# Patient Record
Sex: Female | Born: 1965 | Race: Black or African American | Hispanic: No | Smoking: Former smoker
Health system: Southern US, Community
[De-identification: ages and names within clinical notes are randomized; demographics above are authoritative.]

## PROBLEM LIST (undated history)

## (undated) DIAGNOSIS — E11319 Type 2 diabetes mellitus with unspecified diabetic retinopathy without macular edema: Secondary | ICD-10-CM

## (undated) DIAGNOSIS — K219 Gastro-esophageal reflux disease without esophagitis: Secondary | ICD-10-CM

## (undated) DIAGNOSIS — G8929 Other chronic pain: Secondary | ICD-10-CM

## (undated) DIAGNOSIS — M545 Low back pain, unspecified: Secondary | ICD-10-CM

## (undated) DIAGNOSIS — E669 Obesity, unspecified: Secondary | ICD-10-CM

## (undated) DIAGNOSIS — E785 Hyperlipidemia, unspecified: Secondary | ICD-10-CM

## (undated) DIAGNOSIS — E1169 Type 2 diabetes mellitus with other specified complication: Secondary | ICD-10-CM

## (undated) DIAGNOSIS — I10 Essential (primary) hypertension: Secondary | ICD-10-CM

## (undated) DIAGNOSIS — M549 Dorsalgia, unspecified: Secondary | ICD-10-CM

## (undated) DIAGNOSIS — I639 Cerebral infarction, unspecified: Secondary | ICD-10-CM

## (undated) DIAGNOSIS — R51 Headache: Secondary | ICD-10-CM

## (undated) DIAGNOSIS — Z8673 Personal history of transient ischemic attack (TIA), and cerebral infarction without residual deficits: Secondary | ICD-10-CM

## (undated) DIAGNOSIS — B009 Herpesviral infection, unspecified: Secondary | ICD-10-CM

## (undated) DIAGNOSIS — R519 Headache, unspecified: Secondary | ICD-10-CM

## (undated) HISTORY — DX: Hyperlipidemia, unspecified: E78.5

## (undated) HISTORY — DX: Low back pain: M54.5

## (undated) HISTORY — DX: Obesity, unspecified: E66.9

## (undated) HISTORY — DX: Low back pain, unspecified: M54.50

## (undated) HISTORY — DX: Essential (primary) hypertension: I10

## (undated) HISTORY — PX: LOOP RECORDER INSERTION: EP1214

## (undated) HISTORY — DX: Personal history of transient ischemic attack (TIA), and cerebral infarction without residual deficits: Z86.73

## (undated) HISTORY — DX: Gastro-esophageal reflux disease without esophagitis: K21.9

## (undated) HISTORY — DX: Type 2 diabetes mellitus with other specified complication: E11.69

## (undated) HISTORY — PX: BUBBLE STUDY: SHX6837

## (undated) HISTORY — PX: TEE WITHOUT CARDIOVERSION: SHX5443

## (undated) HISTORY — PX: BREAST LUMPECTOMY: SHX2

---

## 2000-02-11 HISTORY — PX: BREAST LUMPECTOMY: SHX2

## 2004-02-11 DIAGNOSIS — E1169 Type 2 diabetes mellitus with other specified complication: Secondary | ICD-10-CM | POA: Insufficient documentation

## 2010-03-13 DIAGNOSIS — Z8673 Personal history of transient ischemic attack (TIA), and cerebral infarction without residual deficits: Secondary | ICD-10-CM

## 2010-03-13 HISTORY — DX: Personal history of transient ischemic attack (TIA), and cerebral infarction without residual deficits: Z86.73

## 2011-03-27 ENCOUNTER — Ambulatory Visit (INDEPENDENT_AMBULATORY_CARE_PROVIDER_SITE_OTHER): Payer: Self-pay | Admitting: Internal Medicine

## 2011-03-27 ENCOUNTER — Encounter: Payer: Self-pay | Admitting: Internal Medicine

## 2011-03-27 VITALS — BP 197/103 | HR 65 | Temp 97.5°F | Ht 68.0 in | Wt 242.2 lb

## 2011-03-27 DIAGNOSIS — I10 Essential (primary) hypertension: Secondary | ICD-10-CM | POA: Insufficient documentation

## 2011-03-27 DIAGNOSIS — Z7689 Persons encountering health services in other specified circumstances: Secondary | ICD-10-CM | POA: Insufficient documentation

## 2011-03-27 DIAGNOSIS — Z8673 Personal history of transient ischemic attack (TIA), and cerebral infarction without residual deficits: Secondary | ICD-10-CM

## 2011-03-27 DIAGNOSIS — R519 Headache, unspecified: Secondary | ICD-10-CM | POA: Insufficient documentation

## 2011-03-27 DIAGNOSIS — E669 Obesity, unspecified: Secondary | ICD-10-CM

## 2011-03-27 DIAGNOSIS — E119 Type 2 diabetes mellitus without complications: Secondary | ICD-10-CM

## 2011-03-27 DIAGNOSIS — Z79899 Other long term (current) drug therapy: Secondary | ICD-10-CM

## 2011-03-27 DIAGNOSIS — R51 Headache: Secondary | ICD-10-CM

## 2011-03-27 LAB — LIPID PANEL
HDL: 38 mg/dL — ABNORMAL LOW (ref >39)
LDL Cholesterol: 105 mg/dL — ABNORMAL HIGH (ref 0–99)
Triglycerides: 147 mg/dL (ref <150)
VLDL: 29 mg/dL (ref 0–40)

## 2011-03-27 LAB — COMPREHENSIVE METABOLIC PANEL
ALT: 9 U/L (ref 0–35)
AST: 11 U/L (ref 0–37)
Alkaline Phosphatase: 68 U/L (ref 39–117)
BUN: 12 mg/dL (ref 6–23)
Chloride: 102 mEq/L (ref 96–112)
Creat: 0.97 mg/dL (ref 0.50–1.10)
Sodium: 137 mEq/L (ref 135–145)
Total Bilirubin: 0.2 mg/dL — ABNORMAL LOW (ref 0.3–1.2)

## 2011-03-27 LAB — POCT GLYCOSYLATED HEMOGLOBIN (HGB A1C): Hemoglobin A1C: 6.9

## 2011-03-27 LAB — GLUCOSE, CAPILLARY: Glucose-Capillary: 127 mg/dL — ABNORMAL HIGH (ref 70–99)

## 2011-03-27 MED ORDER — LISINOPRIL-HYDROCHLOROTHIAZIDE 10-12.5 MG PO TABS: 1.0000 | ORAL_TABLET | Freq: Every day | ORAL | Status: DC

## 2011-03-27 MED ORDER — ASPIRIN 81 MG PO TABS: 81.0000 mg | ORAL_TABLET | Freq: Every day | ORAL | Status: DC

## 2011-03-27 MED ORDER — ACETAMINOPHEN 500 MG PO TABS: 1000.0000 mg | ORAL_TABLET | Freq: Four times a day (QID) | ORAL | Status: DC | PRN

## 2011-03-27 NOTE — Assessment & Plan Note (Signed)
   Start aspirin daily  Plan risk factor assessment and management -   Check lipid panel today - initiate tx as indicated.  Restart blood pressure medications.

## 2011-03-27 NOTE — Patient Instructions (Addendum)
   Please follow-up at the clinic in 2 weeks with me, at which time we will reevaluate your blood pressure, diabetes.  There have been changes in your medications:  STOP carvedilol  START lisinopril-hydrochlorothiazide for your blood pressure - if you develop rash, tongue swelling, or lip swelling.   Start an aspirin 81 mg once daily.  Use tylenol extra strength as needed for pain.  Please follow-up in the clinic sooner if needed.  If you have been started on new medication(s), and you develop symptoms concerning for allergic reaction, including, but not limited to, throat closing, tongue swelling, rash, please stop the medication immediately and call the clinic at [phone], and go to the ER.  If you are diabetic, please bring your meter to your next visit.  If symptoms worsen, or new symptoms arise, please call the clinic or go to the ER.  Please bring all of your medications in a bag to your next visit.     Items required to complete an Eligibility Application   1. Picture ID (Can't be expired) 2. Current Bill to establish proof of residency 3. W-2 & Tax return (if self-employed include "Schedule C"), if not filing Form 4506 4. 4 current Pay stubs for this year 5. Printout of other income (Social security, unemployment, child support, workmen's comp) 6. Food stamp award letter, if receiving  7. Life Insurance (Need copy of the front page, showing name Ins Co. Name, and face amount). 8. Statement for pension, 401-K, IRS (needs to have current balance) 9. Tax Value for cars, houses, mobile homes, and land (Get from [facility] Tax Department) 10. Disability Paperwork (showing status of case) 11. College students: Print out of [place] received, tuition cost, books, etc. 12. If no Income: Engineer, maintenance of support for free shelter, money, food, Catering manager.  Bring all that you can to your follow up appointment to start the process.

## 2011-03-27 NOTE — Assessment & Plan Note (Signed)
Records requested from her prior PCP at [facility] - pending receipt of records.

## 2011-03-27 NOTE — Assessment & Plan Note (Addendum)
BP Readings from Last 3 Encounters:  03/27/11 197/103    Basic Metabolic Panel: No results found for this basename: na, k, cl, co2, bun, creatinine, glucose, calcium    Assessment: Status: Out of Coreg x 2 months. Financial limitation, so will need to keep on $4 meds for now at least. Previously on Acupril, but doesn't know why it was changed. States never had adverse reaction such as angioedema with ACE-I.  This seems to be where pt is at baseline when off of medications. There are NO new neurologic deficits on exam, no chest pain, etc.  Disease Control: not controlled  Progress toward goals: unable to assess  Barriers to meeting goals: financial need   Plan:  DC coreg  Start Lisinopril-HCTZ    CMET today, then will recheck BMET in 3-4 weeks to evaluate renal function.  Encouraged compliance.  Patient advised of red flag symptoms to indicate hypertensive emergency, for which she should present immediately for evaluation.

## 2011-03-27 NOTE — Progress Notes (Signed)
Subjective:   Patient ID: Michelle Brooks female   DOB: December 03, 1965 46 y.o.   MRN: [id number]  HPI: [name] is a 46 y.o. new patient to [facility] with a PMHx of DMII, Hypertension, prior stroke, who presented to clinic today for the following:  1) Establish care - patient was previously followed at [facility] by Dr. Meyer Cory [phone] (office), [phone] (hospital)). Has recently moved to [place] in 10/2010 to get away from stressors present in [place]. Has just attempted to establish care with a new provider. Has been out of her Coreg x 2 months because her Medicaid apparently doesn't cover prescriptions?  2) Headache -  Pt describes throbbing pain, bilateral in the frontal area. HA occuring for about 2 weeks, states it has been constant sine that time. Onset is insidious. Gets headaches every few weeks. Precipitating factors: perimenstrual patterns. Aggravating factors: bright light and loud noise, coughing, emotional stress, alleviating factors: aspirin, minimally helpful. denies recent trauma, MVA, falls. Family history: no known family members with significant headaches. denies associated congestion, dehydration, flashing lights, nausea, visual disturbance and vomiting.   3) DM, II (last A1c unknown) - Patient not checking blood sugars because does not have a meter - states her medicaid would not cover it. Currently taking Lantus 75 units qHS, occasionally taking 35 units instead of 75 units if she feels that her blood sugars are normal.denies polyuria, polydipsia, nausea, vomiting, diarrhea.  does request refills today.  4) HTN - Patient does not check blood pressure regularly at home. Currently taking Coreg 25mg  BID - has been out of it for about 2 months. admits to frequent headaches, dizziness, lightheadedness, Denies chest pain, shortness of breath, no new focal neurologic deficits.  does request refills today.    Review of Systems: Per HPI.  Current Outpatient  Medications: Medication Sig  . carvedilol (COREG) 25 MG tablet Take 25 mg by mouth 2 (two) times daily with a meal.  . Insulin Glargine (LANTUS SOLOSTAR [place]) Inject 75 Units into the skin at bedtime.    Allergies: No Known Allergies  Past Medical History  Diagnosis Date  . Diabetes mellitus type 2 in obese   . Hypertension   . Stroke 03/2010    Due to uncontrolled blood pressure    Past Surgical History  Procedure Date  . Breast lumpectomy 2002    states benign lymph node removed.     Objective:   Physical Exam: Filed Vitals:   03/27/11 1401  BP: 197/103  Pulse: 65  Temp: 97.5 F (36.4 C)     General: Vital signs reviewed and noted. Well-developed, well-nourished, in no acute distress; alert, appropriate and cooperative throughout examination.  Head: Normocephalic, atraumatic. No tenderness to palpation over maxillary or frontal sinuses.  Eyes: conjunctivae/corneas clear. PERRL, EOM's intact. Fundi benign.  Ears: TM cannot be visualized due to cerumen buildup, is still soft and without impaction.  Nose: Mucous membranes moist, not inflammed, nonerythematous.  Throat: Oropharynx nonerythematous, no exudate appreciated.   Neck: No deformities, masses, or tenderness noted.  Lungs:  Normal respiratory effort. Clear to auscultation BL without crackles or wheezes.  Heart: RRR. S1 and S2 normal without gallop, rubs. (+) murmur.  Abdomen:  BS normoactive. Soft, Nondistended, non-tender.  No masses or organomegaly.  Extremities: No pretibial edema.  Neurologic: A&O X3, CN II - XII are grossly intact. Motor strength is 5/5 in the all 4 extremities, Sensations intact to light touch, Cerebellar signs negative.      Assessment &  Plan:   Case and plan of care discussed with Dr. Blanch Media.

## 2011-03-27 NOTE — Assessment & Plan Note (Signed)
Concerning for tension headache versus secondary to her elevated blood pressures.   Tx with PRN extra strength tylenol  BP control.

## 2011-03-27 NOTE — Assessment & Plan Note (Addendum)
Labs: Lab Results  Component Value Date   HGBA1C 6.9 03/27/2011    Last eye exam and foot exam: No results found for this basename: HMDIABEYEEXA, HMDIABFOOTEX     Assessment:     Status: Patient has not a glucometer ever and does not know how to use a glucometer. Will need to see [facility] after she gets orange card   Disease Control: controlled  Progress toward goals: at goal  Barriers to meeting goals: financial need - still has 1 more flexpen. She wants to get in with [facility] first, before getting refill because she will not be able to afford to fill the Rx.   Plan: Glucometer log was not reviewed today, as pt did not have glucometer available for review.      continue current medications  Will get her a [employer] glucometer and try to set up with [employer].  Will do foot exam next visit.  Plan set up for eye exam after gets insurance/ or orange card settled.  Lipid panel, CMET, microalb/cr today.

## 2011-03-28 LAB — MICROALBUMIN / CREATININE URINE RATIO
Creatinine, Urine: 177.3 mg/dL
Microalb Creat Ratio: 11 mg/g (ref 0.0–30.0)
Microalb, Ur: 1.95 mg/dL — ABNORMAL HIGH (ref 0.00–1.89)

## 2011-04-01 ENCOUNTER — Other Ambulatory Visit: Payer: Self-pay | Admitting: Internal Medicine

## 2011-04-01 DIAGNOSIS — E785 Hyperlipidemia, unspecified: Secondary | ICD-10-CM | POA: Insufficient documentation

## 2011-04-01 DIAGNOSIS — E1169 Type 2 diabetes mellitus with other specified complication: Secondary | ICD-10-CM

## 2011-04-01 HISTORY — DX: Hyperlipidemia, unspecified: E78.5

## 2011-04-01 MED ORDER — PRAVASTATIN SODIUM 20 MG PO TABS: 20.0000 mg | ORAL_TABLET | Freq: Every evening | ORAL | Status: DC

## 2011-04-01 MED ORDER — INSULIN GLARGINE 100 UNIT/ML ~~LOC~~ SOLN: 75.0000 [IU] | Freq: Every day | SUBCUTANEOUS | Status: DC

## 2011-04-01 NOTE — Progress Notes (Signed)
Lipid panel shows suboptimal lipid control, with ldl of 105. Will start low dose pravastatin. Pt called and informed of this. She understands and all questions were answered. She is informed to be aware of signs of allergic reaction or side effects when starting any new medication. Will pick up rx tomorrow.  Will need lipid panel ans cmet in 6 weeks to reevaluate.  Johnette Abraham, D.O.

## 2011-04-02 ENCOUNTER — Encounter: Payer: Self-pay | Admitting: Internal Medicine

## 2011-04-02 MED ORDER — INSULIN GLARGINE 100 UNIT/ML ~~LOC~~ SOLN: 75.0000 [IU] | Freq: Every day | SUBCUTANEOUS | Status: DC

## 2011-04-02 MED ORDER — PRAVASTATIN SODIUM 20 MG PO TABS: 20.0000 mg | ORAL_TABLET | Freq: Every evening | ORAL | Status: DC

## 2011-04-02 NOTE — Progress Notes (Signed)
Addended by: Priscella Mann on: 04/02/2011 10:16 AM   Modules accepted: Orders

## 2011-04-21 ENCOUNTER — Encounter: Payer: Self-pay | Admitting: Internal Medicine

## 2011-04-21 ENCOUNTER — Telehealth: Payer: Self-pay | Admitting: *Deleted

## 2011-04-21 NOTE — Telephone Encounter (Signed)
Pt requests Lantus rx to be called to [facility]; states she has the orange card now and she lost the rx given to her 2/20. Rx called to the pharmacy.

## 2011-05-12 ENCOUNTER — Other Ambulatory Visit (INDEPENDENT_AMBULATORY_CARE_PROVIDER_SITE_OTHER): Payer: Self-pay

## 2011-05-12 ENCOUNTER — Other Ambulatory Visit: Payer: Self-pay | Admitting: Internal Medicine

## 2011-05-12 DIAGNOSIS — Z79899 Other long term (current) drug therapy: Secondary | ICD-10-CM

## 2011-05-12 DIAGNOSIS — Z7251 High risk heterosexual behavior: Secondary | ICD-10-CM

## 2011-05-12 LAB — COMPREHENSIVE METABOLIC PANEL
Albumin: 4.2 g/dL (ref 3.5–5.2)
Alkaline Phosphatase: 69 U/L (ref 39–117)
BUN: 14 mg/dL (ref 6–23)
Calcium: 9.4 mg/dL (ref 8.4–10.5)
Glucose, Bld: 168 mg/dL — ABNORMAL HIGH (ref 70–99)
Potassium: 4.2 mEq/L (ref 3.5–5.3)

## 2011-05-12 LAB — LIPID PANEL
Cholesterol: 151 mg/dL (ref 0–200)
Total CHOL/HDL Ratio: 4.4 Ratio
Triglycerides: 86 mg/dL (ref <150)

## 2011-05-12 NOTE — Progress Notes (Signed)
Addended by: Remus Blake on: 05/12/2011 11:49 AM   Modules accepted: Orders

## 2011-05-13 ENCOUNTER — Ambulatory Visit (INDEPENDENT_AMBULATORY_CARE_PROVIDER_SITE_OTHER): Payer: Self-pay | Admitting: Internal Medicine

## 2011-05-13 ENCOUNTER — Encounter: Payer: Self-pay | Admitting: Internal Medicine

## 2011-05-13 VITALS — BP 122/78 | HR 75 | Temp 97.2°F | Ht 68.0 in | Wt 240.0 lb

## 2011-05-13 DIAGNOSIS — E1165 Type 2 diabetes mellitus with hyperglycemia: Secondary | ICD-10-CM

## 2011-05-13 DIAGNOSIS — Z0000 Encounter for general adult medical examination without abnormal findings: Secondary | ICD-10-CM | POA: Insufficient documentation

## 2011-05-13 DIAGNOSIS — I10 Essential (primary) hypertension: Secondary | ICD-10-CM

## 2011-05-13 DIAGNOSIS — E119 Type 2 diabetes mellitus without complications: Secondary | ICD-10-CM

## 2011-05-13 DIAGNOSIS — Z1231 Encounter for screening mammogram for malignant neoplasm of breast: Secondary | ICD-10-CM

## 2011-05-13 DIAGNOSIS — E1169 Type 2 diabetes mellitus with other specified complication: Secondary | ICD-10-CM

## 2011-05-13 DIAGNOSIS — N63 Unspecified lump in unspecified breast: Secondary | ICD-10-CM

## 2011-05-13 DIAGNOSIS — E785 Hyperlipidemia, unspecified: Secondary | ICD-10-CM

## 2011-05-13 MED ORDER — PRAVASTATIN SODIUM 20 MG PO TABS: 20.0000 mg | ORAL_TABLET | Freq: Every evening | ORAL | Status: DC

## 2011-05-13 MED ORDER — INSULIN GLARGINE 100 UNIT/ML ~~LOC~~ SOLN: 75.0000 [IU] | Freq: Every day | SUBCUTANEOUS | Status: DC

## 2011-05-13 MED ORDER — LISINOPRIL-HYDROCHLOROTHIAZIDE 10-12.5 MG PO TABS: 1.0000 | ORAL_TABLET | Freq: Every day | ORAL | Status: DC

## 2011-05-13 NOTE — Assessment & Plan Note (Signed)
Pertinent Labs: Liver Function Tests:    Component Value Date/Time   AST 11 05/12/2011 1110   ALT 11 05/12/2011 1110   ALKPHOS 69 05/12/2011 1110   BILITOT 0.4 05/12/2011 1110   PROT 7.1 05/12/2011 1110   ALBUMIN 4.2 05/12/2011 1110    Lipid Panel:     Component Value Date/Time   CHOL 151 05/12/2011 1110   TRIG 86 05/12/2011 1110   HDL 34* 05/12/2011 1110   CHOLHDL 4.4 05/12/2011 1110   VLDL 17 05/12/2011 1110   LDLCALC 100* 05/12/2011 1110   Assessment: Status: - Restarted on therapy in 03/2011. - Has tolerated the pravastatin well. - Repeat FLP after starting pravastatin shows LDL at goal.  - Patient is compliant all of the time with prescribed medications.   Disease Control: controlled  Progress toward goals: at goal  Barriers to meeting goals: no barriers identified   Plan:  continue current medications  Continue efforts towards diet control, exercise, weight loss.  Congratulated on all of her great efforts.

## 2011-05-13 NOTE — Assessment & Plan Note (Signed)
Pertinent Data: BP Readings from Last 3 Encounters:  05/13/11 122/78  03/27/11 197/103    Basic Metabolic Panel:    Component Value Date/Time   NA 138 05/12/2011 1110   K 4.2 05/12/2011 1110   CL 103 05/12/2011 1110   CO2 23 05/12/2011 1110   BUN 14 05/12/2011 1110   CREATININE 1.11* 05/12/2011 1110   GLUCOSE 168* 05/12/2011 1110   CALCIUM 9.4 05/12/2011 1110    Assessment: Status: - Patient started on Lisinopril-HCTZ 10-12.5mg  during last visit, and has had a remarkable improvement in her blood pressure with this regimen.   - Repeat BMET after starting the medication shows a slight increased in SCr, but minimal - which can be expected. - Patient is compliant all of the time with prescribed medications.   Disease Control: controlled  Progress toward goals: improved  Barriers to meeting goals: no barriers identified   Plan:  continue current medications  Congratulated on all of her efforts and great work.

## 2011-05-13 NOTE — Assessment & Plan Note (Signed)
Pertinent Labs: Lab Results  Component Value Date   HGBA1C 6.9 03/27/2011   CREATININE 1.11* 05/12/2011   MICROALBUR 1.95* 03/27/2011   MICRALBCREAT 11.0 03/27/2011   CHOL 151 05/12/2011   HDL 34* 05/12/2011   TRIG 86 05/12/2011    Assessment: Status: - Patient intermittently missing her Lantus if she feels her blood sugars are normal - this is what she was previously recommended.  - Patient is noncompliant some of the time with prescribed medications.   Disease Control: controlled  Progress toward goals: at goal  Barriers to meeting goals: no barriers identified   Plan: Glucometer log was not reviewed today, as pt did not have glucometer available for review.     continue current medications  Reminded to get eye exam - referral placed - pt added to [facility] eye list  Reminded to bring blood glucose meter & log to each visit - I need to investigate what glucometer and log are covered by [facility] card/ MAP once she gets it - bc we are unable to download her old meter.  Discussed foot care and discussed the need for weight loss  Encouraged to take her medications regularly, do not miss doses, let us assess control on current therapy - and we can scale back if needed on her Lantus.  Encouraged to follow general rules for hypoglycemia if it should arise.

## 2011-05-13 NOTE — Patient Instructions (Signed)
   Please follow-up at the clinic in 3 months, at which time we will reevaluate your diabetes, blood pressure. We will try to do a PAP at this visit if possible. - OR, please follow-up in the clinic sooner if needed.  There have not been changes in your medications - keep log of your blood sugars, and check them at least once a day.  Take your Lantus every day.  Please get your mammogram completed  Please go for your eye exam - please do not miss this appointment    If you have been started on new medication(s), and you develop symptoms concerning for allergic reaction, including, but not limited to, throat closing, tongue swelling, rash, please stop the medication immediately and call the clinic at [phone], and go to the ER.  If you are diabetic, please bring your meter to your next visit.  If symptoms worsen, or new symptoms arise, please call the clinic or go to the ER.  Please bring all of your medications in a bag to your next visit.

## 2011-05-13 NOTE — Assessment & Plan Note (Signed)
Health Maintenance  Topic Date Due  . Pap Smear  1965/05[birth year]12  . Foot Exam  08/13/1975  . Ophthalmology Exam  08/13/1975  . Mammogram  08/13/1983  . Tetanus/tdap  08/12/1984  . Hemoglobin A1c  06/24/2011  . Influenza Vaccine  11/11/2011  . Urine Microalbumin  03/26/2012  . Lipid Panel  05/11/2012  . Pneumococcal Polysaccharide Vaccine (#2) 03/26/2015    Assessment:  Due for mammogram, eye exam, foot exam, Tdap, PAP.   Plan:  Mammogram referral made today.  Scheduled patient for [facility] eye exam.  Foot exam today.  Plan PAP next visit.  Will discuss Tdap next visit.

## 2011-05-13 NOTE — Progress Notes (Signed)
Subjective:    Patient ID: Michelle Brooks female   DOB: August 04, 1965 46 y.o.   MRN: [id number]  HPI: Michelle Brooks is a 46 y.o. F with a PMHx of DMII, Hypertension, prior stroke, who presented to clinic today for the following:  1) DM, II - Last A1c 6.9 03/2011 - Reports fasting blood sugars of 106-130s mg/dL. Currently taking Lantus 75 units. Patient misses doses 2 x per week on average if she feels that her blood sugars are normal.  0 hypoglycemic episodes since last visit. denies polyuria, polydipsia, nausea, vomiting, diarrhea. Does request refills today.   2) HTN - Patient does not check blood pressure regularly at home. Currently taking Lisinopril-HCTZ 10-12.5mg  daily, which was started last visit. Patient misses doses 0 x per week on average. denies headaches, dizziness, lightheadedness, chest pain, shortness of breath.  does request refills today.  3) HLD - currently taking Pravastatin 20mg  once daily. Patient misses doses 0 x per week on average. denies chest pain, difficulty breathing, palpitations, tachycardia, and muscle pains. does request refills today.    4) Preventative Health Care - due for mammogram, eye exam, foot exam, Tdap, PAP.   Review of Systems: Per HPI.  Current Outpatient Medications: Medication Sig  . aspirin 81 MG tablet Take 1 tablet (81 mg total) by mouth daily.  . insulin glargine (LANTUS SOLOSTAR) 100 UNIT/ML injection Inject 75 Units into the skin at bedtime. Dispense 2 boxes - needs 8 pens per month.  . lisinopril-hydrochlorothiazide (PRINZIDE) 10-12.5 MG per tablet Take 1 tablet by mouth daily.  . pravastatin (PRAVACHOL) 20 MG tablet Take 1 tablet (20 mg total) by mouth every evening.  [name] acetaminophen (TYLENOL) 500 MG tablet Take 2 tablets (1,000 mg total) by mouth every 6 (six) hours as needed for pain.   Allergies: No Known Allergies  Past Medical History  Diagnosis Date  . Diabetes mellitus type 2 in obese   . Hypertension   . History of stroke  03/2010    Right MCA stroke in 03/2010, with MRI also noting subacute strokes in left fronto parietal area - occured in [place] received care at [facility] thought due to uncontrolled blood pressure // No residual deficits // TTE (03/2010) at [facility] - normal LV systolic function, moderate pericardial effusion with 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  [name] Hyperlipidemia LDL goal < 100 04/01/2011    Past Surgical History  Procedure Date  . Breast lumpectomy 2002    states benign lymph node removed.    Objective:    Physical Exam: Filed Vitals:   05/13/11 1130  BP: 149/87  Pulse: 75  Temp: 97.2 F (36.2 C)    Recheck BP: 122/78  General: Vital signs reviewed and noted. Well-developed, well-nourished, in no acute distress; alert, appropriate and cooperative throughout examination.  Head: Normocephalic, atraumatic. No tenderness to palpation over maxillary or frontal sinuses.  Eyes: conjunctivae/corneas clear. PERRL.  Ears: TM cannot be visualized due to cerumen buildup, is still soft and without impaction.  Nose: Mucous membranes moist, not inflammed, nonerythematous.  Throat: Oropharynx nonerythematous, no exudate appreciated.   Neck: No deformities, masses, or tenderness noted.  Lungs:  Normal respiratory effort. Clear to auscultation BL without crackles or wheezes.  Heart: RRR. S1 and S2 normal without gallop, rubs. (+) murmur.  Abdomen:  BS normoactive. Soft, Nondistended, non-tender.  No masses or organomegaly.  Extremities: No pretibial edema.    Assessment/ Plan:   Case  and plan of care discussed with Dr. Doneen Poisson.

## 2011-05-14 ENCOUNTER — Other Ambulatory Visit: Payer: Self-pay

## 2011-06-04 ENCOUNTER — Telehealth: Payer: Self-pay | Admitting: *Deleted

## 2011-06-04 NOTE — Telephone Encounter (Signed)
Pt called stating she is in [place], [place] and was not able to keep her Mammogram appointment.  She called and changed it to May 21. This is FYI.

## 2011-06-06 ENCOUNTER — Ambulatory Visit (HOSPITAL_COMMUNITY): Payer: Self-pay

## 2011-06-12 ENCOUNTER — Other Ambulatory Visit: Payer: Self-pay | Admitting: Internal Medicine

## 2011-06-12 DIAGNOSIS — Q141 Congenital malformation of retina: Secondary | ICD-10-CM

## 2011-07-01 ENCOUNTER — Telehealth: Payer: Self-pay | Admitting: *Deleted

## 2011-07-01 ENCOUNTER — Ambulatory Visit (HOSPITAL_COMMUNITY)
Admission: RE | Admit: 2011-07-01 | Discharge: 2011-07-01 | Disposition: A | Discharge status: Home / Self Care | Payer: Medicaid Other | Source: Ambulatory Visit | Attending: Internal Medicine | Admitting: Internal Medicine

## 2011-07-01 DIAGNOSIS — Z1231 Encounter for screening mammogram for malignant neoplasm of breast: Secondary | ICD-10-CM | POA: Insufficient documentation

## 2011-07-01 NOTE — Telephone Encounter (Signed)
CALLED PATIENT LEFT VOICE MESSAGE FOR PATIENT TO CALL BACK, NEED TO KNOW IF SHE HAS HER MEDICAID CARD / WHO'S THE PCP ON THE CARD, AND WE NEED COPY OF CARD IN THE SYSTEM. NEED TO MAKE REFERRAL WITH DR Luciana Axe.  Genesys Coggeshall NT [phone]  11:04PM

## 2011-07-03 ENCOUNTER — Observation Stay (HOSPITAL_COMMUNITY)
Admission: EM | Admit: 2011-07-03 | Discharge: 2011-07-03 | Disposition: A | Discharge status: Home / Self Care | Payer: Self-pay | Attending: Emergency Medicine | Admitting: Emergency Medicine

## 2011-07-03 ENCOUNTER — Encounter (HOSPITAL_COMMUNITY): Payer: Self-pay | Admitting: *Deleted

## 2011-07-03 ENCOUNTER — Telehealth: Payer: Self-pay | Admitting: *Deleted

## 2011-07-03 DIAGNOSIS — R739 Hyperglycemia, unspecified: Secondary | ICD-10-CM

## 2011-07-03 DIAGNOSIS — E785 Hyperlipidemia, unspecified: Secondary | ICD-10-CM | POA: Insufficient documentation

## 2011-07-03 DIAGNOSIS — I10 Essential (primary) hypertension: Secondary | ICD-10-CM | POA: Insufficient documentation

## 2011-07-03 DIAGNOSIS — E119 Type 2 diabetes mellitus without complications: Principal | ICD-10-CM | POA: Insufficient documentation

## 2011-07-03 DIAGNOSIS — Z8673 Personal history of transient ischemic attack (TIA), and cerebral infarction without residual deficits: Secondary | ICD-10-CM | POA: Insufficient documentation

## 2011-07-03 HISTORY — DX: Cerebral infarction, unspecified: I63.9

## 2011-07-03 LAB — POCT I-STAT, CHEM 8
BUN: 24 mg/dL — ABNORMAL HIGH (ref 6–23)
Calcium, Ion: 1.16 mmol/L (ref 1.12–1.32)
Chloride: 98 mEq/L (ref 96–112)
Glucose, Bld: 591 mg/dL (ref 70–99)
HCT: 43 % (ref 36.0–46.0)
TCO2: 25 mmol/L (ref 0–100)

## 2011-07-03 LAB — URINALYSIS, ROUTINE W REFLEX MICROSCOPIC
Glucose, UA: >1000 mg/dL — AB
Ketones, ur: 15 mg/dL — AB
Leukocytes, UA: NEGATIVE
Protein, ur: NEGATIVE mg/dL
Urobilinogen, UA: 0.2 mg/dL (ref 0.0–1.0)

## 2011-07-03 LAB — GLUCOSE, CAPILLARY
Glucose-Capillary: 451 mg/dL — ABNORMAL HIGH (ref 70–99)
Glucose-Capillary: 418 mg/dL — ABNORMAL HIGH (ref 70–99)
Glucose-Capillary: 261 mg/dL — ABNORMAL HIGH (ref 70–99)

## 2011-07-03 LAB — URINE MICROSCOPIC-ADD ON

## 2011-07-03 LAB — BASIC METABOLIC PANEL
Chloride: 103 mEq/L (ref 96–112)
GFR calc Af Amer: 86 mL/min — ABNORMAL LOW (ref >90)
GFR calc non Af Amer: 74 mL/min — ABNORMAL LOW (ref >90)
Potassium: 3.3 mEq/L — ABNORMAL LOW (ref 3.5–5.1)
Sodium: 137 mEq/L (ref 135–145)

## 2011-07-03 LAB — POCT I-STAT 3, ART BLOOD GAS (G3+)
Acid-base deficit: 2 mmol/L (ref 0.0–2.0)
Bicarbonate: 23.1 mEq/L (ref 20.0–24.0)
O2 Saturation: 94 %
Patient temperature: 98.6
TCO2: 24 mmol/L (ref 0–100)

## 2011-07-03 LAB — CBC
MCH: 29.3 pg (ref 26.0–34.0)
MCHC: 35.7 g/dL (ref 30.0–36.0)
MCV: 82.2 fL (ref 78.0–100.0)
Platelets: 256 10*3/uL (ref 150–400)
RBC: 4.6 MIL/uL (ref 3.87–5.11)
RDW: 12.7 % (ref 11.5–15.5)

## 2011-07-03 LAB — DIFFERENTIAL
Basophils Relative: 0 % (ref 0–1)
Eosinophils Absolute: 0.2 10*3/uL (ref 0.0–0.7)
Eosinophils Relative: 2 % (ref 0–5)
Lymphs Abs: 3 10*3/uL (ref 0.7–4.0)
Neutrophils Relative %: 56 % (ref 43–77)

## 2011-07-03 MED ORDER — ONDANSETRON HCL 4 MG/2ML IJ SOLN: 4.0000 mg | Freq: Three times a day (TID) | INTRAMUSCULAR | Status: DC | PRN

## 2011-07-03 MED ORDER — SODIUM CHLORIDE 0.9 % IV SOLN
INTRAVENOUS | Status: DC
  Administered 2011-07-03: 07:00:00 via INTRAVENOUS

## 2011-07-03 MED ORDER — ACETAMINOPHEN 325 MG PO TABS: 650.0000 mg | ORAL_TABLET | ORAL | Status: DC | PRN

## 2011-07-03 MED ORDER — SODIUM CHLORIDE 0.9 % IV SOLN
INTRAVENOUS | Status: DC
  Administered 2011-07-03: 09:00:00 via INTRAVENOUS
  Administered 2011-07-03: 3.9 [IU]/h via INTRAVENOUS
  Filled 2011-07-03: qty 1

## 2011-07-03 MED ORDER — SODIUM CHLORIDE 0.9 % IV BOLUS (SEPSIS)
1000.0000 mL | Freq: Once | INTRAVENOUS | Status: AC
  Administered 2011-07-03: 1000 mL via INTRAVENOUS

## 2011-07-03 NOTE — Discharge Instructions (Signed)
Read the information below.  Please monitor your blood sugar closely and take your medication as directed.  Follow a diabetic diet.  Call your primary care provider today to schedule a close follow up appointment.  You may return to the ER at any time for worsening condition or any new symptoms that concern you.   Blood Sugar Monitoring, Adult GLUCOSE METERS FOR SELF-MONITORING OF BLOOD GLUCOSE  It is important to be able to correctly measure your blood sugar (glucose). You can use a blood glucose monitor (a small battery-operated device) to check your glucose level at any time. This allows you and your caregiver to monitor your diabetes and to determine how well your treatment plan is working. The process of monitoring your blood glucose with a glucose meter is called self-monitoring of blood glucose (SMBG). When people with diabetes control their blood sugar, they have better health. To test for glucose with a typical glucose meter, place the disposable strip in the meter. Then place a small sample of blood on the "test strip." The test strip is coated with chemicals that combine with glucose in blood. The meter measures how much glucose is present. The meter displays the glucose level as a number. Several new models can record and store a number of test results. Some models can connect to personal computers to store test results or print them out.  Newer meters are often easier to use than older models. Some meters allow you to get blood from places other than your fingertip. Some new models have automatic timing, error codes, signals, or barcode readers to help with proper adjustment (calibration). Some meters have a large display screen or spoken instructions for people with visual impairments.  INSTRUCTIONS FOR USING GLUCOSE METERS  Wash your hands with soap and warm water, or clean the area with alcohol. Dry your hands completely.   Prick the side of your fingertip with a lancet (a sharp-pointed  tool used by hand).   Hold the hand down and gently milk the finger until a small drop of blood appears. Catch the blood with the test strip.   Follow the instructions for inserting the test strip and using the SMBG meter. Most meters require the meter to be turned on and the test strip to be inserted before applying the blood sample.   Record the test result.   Read the instructions carefully for both the meter and the test strips that go with it. Meter instructions are found in the user manual. Keep this manual to help you solve any problems that may arise. Many meters use "error codes" when there is a problem with the meter, the test strip, or the blood sample on the strip. You will need the manual to understand these error codes and fix the problem.   New devices are available such as laser lancets and meters that can test blood taken from "alternative sites" of the body, other than fingertips. However, you should use standard fingertip testing if your glucose changes rapidly. Also, use standard testing if:   You have eaten, exercised, or taken insulin in the past 2 hours.   You think your glucose is low.   You tend to not feel symptoms of low blood glucose (hypoglycemia).   You are ill or under stress.   Clean the meter as directed by the manufacturer.   Test the meter for accuracy as directed by the manufacturer.   Take your meter with you to your caregiver's office. This way, you  can test your glucose in front of your caregiver to make sure you are using the meter correctly. Your caregiver can also take a sample of blood to test using a routine lab method. If values on the glucose meter are close to the lab results, you and your caregiver will see that your meter is working well and you are using good technique. Your caregiver will advise you about what to do if the results do not match.  FREQUENCY OF TESTING  Your caregiver will tell you how often you should check your blood  glucose. This will depend on your type of diabetes, your current level of diabetes control, and your types of medicines. The following are general guidelines, but your care plan may be different. Record all your readings and the time of day you took them for review with your caregiver.   Diabetes type 1.   When you are using insulin with good diabetic control (either multiple daily injections or via a pump), you should check your glucose 4 times a day.   If your diabetes is not well controlled, you may need to monitor more frequently, including before meals and 2 hours after meals, at bedtime, and occasionally between 2 a.m. and 3 a.m.   You should always check your glucose before a dose of insulin or before changing the rate on your insulin pump.   Diabetes type 2.   Guidelines for SMBG in diabetes type 2 are not as well defined.   If you are on insulin, follow the guidelines above.   If you are on medicines, but not insulin, and your glucose is not well controlled, you should test at least twice daily.   If you are not on insulin, and your diabetes is controlled with medicines or diet alone, you should test at least once daily, usually before breakfast.   A weekly profile will help your caregiver advise you on your care plan. The week before your visit, check your glucose before a meal and 2 hours after a meal at least daily. You may want to test before and after a different meal each day so you and your caregiver can tell how well controlled your blood sugars are throughout the course of a 24 hour period.   Gestational diabetes (diabetes during pregnancy).   Frequent testing is often necessary. Accurate timing is important.   If you are not on insulin, check your glucose 4 times a day. Check it before breakfast and 1 hour after the start of each meal.   If you are on insulin, check your glucose 6 times a day. Check it before each meal and 1 hour after the first bite of each meal.    General guidelines.   More frequent testing is required at the start of insulin treatment. Your caregiver will instruct you.   Test your glucose any time you suspect you have low blood sugar (hypoglycemia).   You should test more often when you change medicines, when you have unusual stress or illness, or in other unusual circumstances.  OTHER THINGS TO KNOW ABOUT GLUCOSE METERS  Measurement Range. Most glucose meters are able to read glucose levels over a broad range of values from as low as 0 to as high as 600 mg/dL. If you get an extremely high or low reading from your meter, you should first confirm it with another reading. Report very high or very low readings to your caregiver.   Whole Blood Glucose versus Plasma Glucose. Some older  home glucose meters measure glucose in your whole blood. In a lab or when using some newer home glucose meters, the glucose is measured in your plasma (one component of blood). The difference can be important. It is important for you and your caregiver to know whether your meter gives its results as "whole blood equivalent" or "plasma equivalent."   Display of High and Low Glucose Values. Part of learning how to operate a meter is understanding what the meter results mean. Know how high and low glucose concentrations are displayed on your meter.   Factors that Affect Glucose Meter Performance. The accuracy of your test results depends on many factors and varies depending on the brand and type of meter. These factors include:   Low red blood cell count (anemia).   Substances in your blood (such as uric acid, vitamin C, and others).   Environmental factors (temperature, humidity, altitude).   Name-brand versus generic test strips.   Calibration. Make sure your meter is set up properly. It is a good idea to do a calibration test with a control solution recommended by the manufacturer of your meter whenever you begin using a fresh bottle of test strips.  This will help verify the accuracy of your meter.   Improperly stored, expired, or defective test strips. Keep your strips in a dry place with the lid on.   Soiled meter.   Inadequate blood sample.  NEW TECHNOLOGIES FOR GLUCOSE TESTING Alternative site testing Some glucose meters allow testing blood from alternative sites. These include the:  Upper arm.   Forearm.   Base of the thumb.   Thigh.  Sampling blood from alternative sites may be desirable. However, it may have some limitations. Blood in the fingertips show changes in glucose levels more quickly than blood in other parts of the body. This means that alternative site test results may be different from fingertip test results, not because of the meter's ability to test accurately, but because the actual glucose concentration can be different.  Continuous Glucose Monitoring Devices to measure your blood glucose continuously are available, and others are in development. These methods can be more expensive than self-monitoring with a glucose meter. However, it is uncertain how effective and reliable these devices are. Your caregiver will advise you if this approach makes sense for you. IF BLOOD SUGARS ARE CONTROLLED, PEOPLE WITH DIABETES REMAIN HEALTHIER.  SMBG is an important part of the treatment plan of patients with diabetes mellitus. Below are reasons for using SMBG:   It confirms that your glucose is at a specific, healthy level.   It detects hypoglycemia and severe hyperglycemia.   It allows you and your caregiver to make adjustments in response to changes in lifestyle for individuals requiring medicine.   It determines the need for starting insulin therapy in temporary diabetes that happens during pregnancy (gestational diabetes).  Document Released: 01/30/2003 Document Revised: 01/16/2011 Document Reviewed: 05/23/2010 [facility] Patient Information 2012 [place], [place].  Type 2 Diabetes Diabetes is a long-lasting  (chronic) disease. One or both of the following happen with type 2 diabetes:   The pancreas does not make enough of a hormone called insulin.   The body has trouble using the insulin that is made.  HOME CARE  Check your blood sugar (glucose) once a day, or as told by your doctor.   Take all medicine as told by your doctor.   Do not smoke.   Eat healthy foods. Weight loss can help your diabetes.   Learn  about low blood sugar (hypoglycemia). Know how to treat it.   Get your eyes checked on a regular basis.   Get a physical exam every year. Get your blood pressure checked. Get your blood and pee (urine) tested.   Wear a necklace or bracelet that says you have diabetes.   Check your feet every night for cuts, sores, blisters, and redness. Tell your doctor if you have problems.  GET HELP RIGHT AWAY IF:  You have trouble keeping your blood sugar in target range.   You have problems with your medicines.   You are sick and not getting better after 24 hours.   You have a sore or wound that is not healing.   You have vision problems or changes.   You have a fever.  MAKE SURE YOU:  Understand these instructions.   Will watch your condition.   Will get help right away if you are not doing well or get worse.  Document Released: 11/06/2007 Document Revised: 01/16/2011 Document Reviewed: 07/15/2010 [facility] Patient Information 2012 [place], [place].Type 2 Diabetes Diabetes is a long-lasting (chronic) disease. One or both of the following happen with type 2 diabetes:   The pancreas does not make enough of a hormone called insulin.   The body has trouble using the insulin that is made.  HOME CARE  Check your blood sugar (glucose) once a day, or as told by your doctor.   Take all medicine as told by your doctor.   Do not smoke.   Eat healthy foods. Weight loss can help your diabetes.   Learn about low blood sugar (hypoglycemia). Know how to treat it.   Get your eyes  checked on a regular basis.   Get a physical exam every year. Get your blood pressure checked. Get your blood and pee (urine) tested.   Wear a necklace or bracelet that says you have diabetes.   Check your feet every night for cuts, sores, blisters, and redness. Tell your doctor if you have problems.  GET HELP RIGHT AWAY IF:  You have trouble keeping your blood sugar in target range.   You have problems with your medicines.   You are sick and not getting better after 24 hours.   You have a sore or wound that is not healing.   You have vision problems or changes.   You have a fever.  MAKE SURE YOU:  Understand these instructions.   Will watch your condition.   Will get help right away if you are not doing well or get worse.  Document Released: 11/06/2007 Document Revised: 01/16/2011 Document Reviewed: 07/15/2010 [facility] Patient Information 2012 [place], [place].

## 2011-07-03 NOTE — ED Notes (Signed)
Patient presents to ed c/o not feeling well states she has felt sluggish for the past several days. States she is suppose to be taking 75 units of Lantus insulin every night at bedtime. States she hasn't taken in 2-3 weeks. She checked her sugar at home this am and it registered high on 2 different machines so she decided to come in however prior to coming did have one dose of insulin left and states she took it. Alert oriented daugher with patient

## 2011-07-03 NOTE — ED Provider Notes (Signed)
Medical screening examination/treatment/procedure(s) were conducted as a shared visit with non-physician practitioner(s) and myself.  I personally evaluated the patient during the encounter   Laray Anger, DO 07/03/11 1639

## 2011-07-03 NOTE — ED Notes (Signed)
Report called to [facility] CDU

## 2011-07-03 NOTE — ED Notes (Signed)
Insulin drip stopped. Pt sleeping. resp even and nonlabored

## 2011-07-03 NOTE — ED Provider Notes (Signed)
7:16 AM Patient is CDU under observation, hyperglycemia protocol. Sign out received from Dr Clarene Duke.  Pt has been off of her Lantus for two weeks.  She is a patient of [facility], apparently has Lantus waiting for her at the pharmacy for pick up. Blood glucose is 508.  Anion gap is 9.  Pt currently starting glucose stabilizer, K to be rechecked around 9am (orders placed by Dr Clarene Duke).  Plan is for better control of blood glucose and d/c home.    7:52 AM Patient reports she is feeling much better than when she came in.  Agrees that she does have medication waiting for her at the pharmacy.    10:52 AM Pt continues to feel well.  CBG now 199.  Pt has Lantus at the pharmacy that she will pick up today.  Plan is for d/c home with PCP follow up.  Patient verbalizes understanding and agrees with plan.    Michelle Brooks, [place] 07/03/11 1052

## 2011-07-03 NOTE — ED Provider Notes (Signed)
History     CSN: [id number]  Arrival date & time 07/03/11  0310   First MD Initiated Contact with Patient 07/03/11 0340      Chief Complaint  Patient presents with  . Hyperglycemia     HPI Pt was seen at 0350.  Per pt, c/o gradual onset and persistence of constant "high" blood sugars at home since yesterday.  Pt states she has not taken her SQ lantus in approx 2 weeks.  States she does have a new lantus rx "waiting for me at the pharmacy" to be picked up today.  States she had one dose of lantus 75 units left at home and took it approx 0100 this morning PTA.  Pt's only complaint is "I'm just tired."  Denies CP/palpitations, no SOB/cough, no abd pain, no N/V/D, no fevers.      PMD:  [facility] Past Medical History  Diagnosis Date  . Diabetes mellitus type 2 in obese   . Hypertension   . 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  [name] Hyperlipidemia LDL goal < 100 04/01/2011  . Stroke     Past Surgical History  Procedure Date  . Breast lumpectomy 2002    states benign lymph node removed.    Family History  Problem Relation Age of Onset  . Diabetes Mother   . Stroke Mother 80  . Diabetes Brother   . Diabetes Brother   . Cancer Brother 48    unknown type  . Hypertension Brother     History  Substance Use Topics  . Smoking status: Former Smoker -- 0.2 packs/day for 7 years    Types: Cigarettes    Quit date: 03/26/2004  . Smokeless tobacco: Never Used  . Alcohol Use: No    Review of Systems ROS: Statement: All systems negative except as marked or noted in the HPI; Constitutional: +generalized  fatigue. Negative for fever and chills. ; ; Eyes: Negative for eye pain, redness and discharge. ; ; ENMT: Negative for ear pain, hoarseness, nasal congestion, sinus pressure and sore throat. ; ; Cardiovascular: Negative for chest pain, palpitations, diaphoresis, dyspnea and peripheral edema. ; ; Respiratory: Negative for cough, wheezing and stridor. ; ; Gastrointestinal: Negative for nausea, vomiting, diarrhea, abdominal pain, blood in stool, hematemesis, jaundice and rectal bleeding. . ; ; Genitourinary: Negative for dysuria, flank pain and hematuria. ; ; Musculoskeletal: Negative for back pain and neck pain. Negative for swelling and trauma.; ; Skin: Negative for pruritus, rash, abrasions, blisters, bruising and skin lesion.; ; Neuro: Negative for headache, lightheadedness and neck stiffness. Negative for weakness, altered level of consciousness , altered mental status, extremity weakness, paresthesias, involuntary movement, seizure and syncope.      Allergies  Review of patient's allergies indicates no known allergies.  Home Medications   Current Outpatient Rx  Name Route Sig Dispense Refill  . ASPIRIN 81 MG PO TABS Oral Take 1 tablet (81 mg total) by mouth daily. 30 tablet   . INSULIN GLARGINE 100 UNIT/ML Kimberly SOLN Subcutaneous Inject 75 Units into the skin at bedtime. Dispense 2 boxes - needs 8 pens per month. 8 pen 12  .  LISINOPRIL-HYDROCHLOROTHIAZIDE 10-12.5 MG PO TABS Oral Take 1 tablet by mouth daily. 30 tablet 5  . PRAVASTATIN SODIUM 20 MG PO TABS Oral Take 1 tablet (20 mg total) by mouth every evening. 30 tablet 5  . TETRAHYDROZOLINE-ZN SULFATE 0.05-0.25 % OP SOLN Both Eyes Place 2 drops into both eyes 3 (three) times daily as needed. Dry eyes      BP 139/86  Pulse 85  Temp(Src) 98.1 F (36.7 C) (Oral)  Resp 16  SpO2 96%  LMP 06/15/2011  Physical Exam 0355: Physical examination:  Nursing notes reviewed; Vital signs and O2 SAT reviewed;  Constitutional: Well developed, Well  nourished, Well hydrated, In no acute distress; Head:  Normocephalic, atraumatic; Eyes: EOMI, PERRL, No scleral icterus; ENMT: Mouth and pharynx normal, Mucous membranes moist; Neck: Supple, Full range of motion, No lymphadenopathy; Cardiovascular: Regular rate and rhythm, No murmur or gallop; Respiratory: Breath sounds clear & equal bilaterally, No wheezes, speaking full sentences with ease, Normal respiratory effort/excursion; Chest: Nontender, Movement normal; Abdomen: Soft, Nontender, Nondistended, Normal bowel sounds; Genitourinary: No CVA tenderness; Extremities: Pulses normal, No tenderness, No edema, No calf edema or asymmetry.; Neuro: AA&Ox3, Major CN grossly intact. Speech clear. Moves all ext on stretcher.; Skin: Color normal, Warm, Dry   ED Course  Procedures    MDM  MDM Reviewed: nursing note and vitals Interpretation: labs     Results for orders placed during the hospital encounter of 07/03/11  CBC      Component Value Range   WBC 8.3  4.0 - 10.5 (K/uL)   RBC 4.60  3.87 - 5.11 (MIL/uL)   Hemoglobin 13.5  12.0 - 15.0 (g/dL)   HCT 16.1  09.6 - 04.5 (%)   MCV 82.2  78.0 - 100.0 (fL)   MCH 29.3  26.0 - 34.0 (pg)   MCHC 35.7  30.0 - 36.0 (g/dL)   RDW 40.9  81.1 - 91.4 (%)   Platelets 256  150 - 400 (K/uL)  DIFFERENTIAL      Component Value Range   Neutrophils Relative 56  43 - 77 (%)   Neutro Abs 4.7  1.7 - 7.7 (K/uL)   Lymphocytes Relative 36  12 - 46 (%)   Lymphs Abs 3.0  0.7 - 4.0 (K/uL)   Monocytes Relative 6  3 - 12 (%)   Monocytes Absolute 0.5  0.1 - 1.0 (K/uL)   Eosinophils Relative 2  0 - 5 (%)   Eosinophils Absolute 0.2  0.0 - 0.7 (K/uL)   Basophils Relative 0  0 - 1 (%)   Basophils Absolute 0.0  0.0 - 0.1 (K/uL)  GLUCOSE, CAPILLARY      Component Value Range   Glucose-Capillary 550 (*) 70 - 99 (mg/dL)  POCT I-STAT, CHEM 8      Component Value Range   Sodium 132 (*) 135 - 145 (mEq/L)   Potassium 3.6  3.5 - 5.1 (mEq/L)   Chloride 98  96 - 112 (mEq/L)    BUN 24 (*) 6 - 23 (mg/dL)   Creatinine, Ser 7.82 (*) 0.50 - 1.10 (mg/dL)   Glucose, Bld 956 (*) 70 - 99 (mg/dL)   Calcium, Ion 2.13  0.86 - 1.32 (mmol/L)   TCO2 25  0 - 100 (mmol/L)   Hemoglobin 14.6  12.0 - 15.0 (g/dL)   HCT 57.8  46.9 - 62.9 (%)  POCT I-STAT 3, BLOOD GAS (G3+)      Component Value Range   pH, Arterial 7.350  7.350 - 7.400  pCO2 arterial 41.9  35.0 - 45.0 (mmHg)   pO2, Arterial 76.0 (*) 80.0 - 100.0 (mmHg)   Bicarbonate 23.1  20.0 - 24.0 (mEq/L)   TCO2 24  0 - 100 (mmol/L)   O2 Saturation 94.0     Acid-base deficit 2.0  0.0 - 2.0 (mmol/L)   Patient temperature 98.6 F     Collection site RADIAL, ALLEN'S TEST ACCEPTABLE     Drawn by Operator     Sample type ARTERIAL    GLUCOSE, CAPILLARY      Component Value Range   Glucose-Capillary 508 (*) 70 - 99 (mg/dL)     1610:  Pt not acidotic, AG 9.  Known DM and not taking her meds.  [facility] is her PMD.  Pt agreeable to CDU Obs protocol for hyperglycemia.    9604:  T/C to [facility] Resident Dr. Elisabeth Pigeon, case discussed, including:  HPI, pertinent PM/SHx, VS/PE, dx testing, ED course and treatment:  Agreeable with decision to place pt on CDU Obs protocol for hyperglycemia, will inform next shift, aware pt will need clinic f/u.    0715:  Will recheck BMP at approx 0900.  Sign out to Dr. Judd Lien and PA Colletta [place], DO 07/03/11 [phone]

## 2011-07-03 NOTE — Telephone Encounter (Signed)
CALLED PATIENT LEFT VOICE MESSAGE FOR HER TO CALL [facility] / NEED FOR HER TO BRING IN HER MEDICAID CARD TO BE COPIED/ NEED TO SEND THIS INFO WITH THE REFERRAL. ALSO TO MAKE SURE THAT [facility] IS THE PRIMARY LISTED ON HER CARD. LELS STRUDIVANT NT   [phone]  10:33AM

## 2011-07-03 NOTE — ED Notes (Signed)
cbg read "high" on 2 different meters at home, took lantus 75 units PTA, here with daughter, c/o "dry mouth", denies other sx. Alert, NAD, calm, interactive, ambulatory with steady gait.

## 2011-07-04 LAB — URINE CULTURE: Colony Count: 9000

## 2011-07-10 ENCOUNTER — Telehealth: Payer: Self-pay | Admitting: *Deleted

## 2011-07-10 NOTE — Telephone Encounter (Signed)
CALLED PATIENT NUMEROUS OF TIMES WITH NO CALL BACK FROM THE PATIENT.  NEED TO REFERRAL PATIENT TO THE [facility] FOR FOLLOW UP. PATIENT HAD MEDICAID. OFFICE IS WANTING COPY OF CARD BEFORE I CAN GET THE APPT. SENDING LETTER TO PATIENT HOPING TO HERE BACK FROM HER.  Stashia Sia NT [phone]  3:30PM

## 2011-07-31 ENCOUNTER — Encounter: Payer: Medicaid Other | Admitting: Internal Medicine

## 2011-08-19 NOTE — Addendum Note (Signed)
Addended by: Neomia Dear on: 08/19/2011 06:28 PM   Modules accepted: Orders

## 2011-08-28 ENCOUNTER — Encounter: Payer: Medicaid Other | Admitting: Internal Medicine

## 2011-09-11 ENCOUNTER — Ambulatory Visit (INDEPENDENT_AMBULATORY_CARE_PROVIDER_SITE_OTHER): Payer: Medicaid Other | Admitting: Internal Medicine

## 2011-09-11 ENCOUNTER — Other Ambulatory Visit (HOSPITAL_COMMUNITY)
Admission: RE | Admit: 2011-09-11 | Discharge: 2011-09-11 | Disposition: A | Discharge status: Home / Self Care | Payer: Medicaid Other | Source: Ambulatory Visit | Attending: Internal Medicine | Admitting: Internal Medicine

## 2011-09-11 ENCOUNTER — Encounter: Payer: Self-pay | Admitting: Internal Medicine

## 2011-09-11 VITALS — BP 145/88 | HR 65 | Temp 97.1°F | Ht 68.0 in | Wt 236.9 lb

## 2011-09-11 DIAGNOSIS — E119 Type 2 diabetes mellitus without complications: Secondary | ICD-10-CM

## 2011-09-11 DIAGNOSIS — N926 Irregular menstruation, unspecified: Secondary | ICD-10-CM

## 2011-09-11 DIAGNOSIS — E669 Obesity, unspecified: Secondary | ICD-10-CM

## 2011-09-11 DIAGNOSIS — I10 Essential (primary) hypertension: Secondary | ICD-10-CM

## 2011-09-11 DIAGNOSIS — Z79899 Other long term (current) drug therapy: Secondary | ICD-10-CM

## 2011-09-11 DIAGNOSIS — Z124 Encounter for screening for malignant neoplasm of cervix: Secondary | ICD-10-CM | POA: Insufficient documentation

## 2011-09-11 DIAGNOSIS — R739 Hyperglycemia, unspecified: Secondary | ICD-10-CM | POA: Insufficient documentation

## 2011-09-11 DIAGNOSIS — L293 Anogenital pruritus, unspecified: Secondary | ICD-10-CM

## 2011-09-11 DIAGNOSIS — Z0000 Encounter for general adult medical examination without abnormal findings: Secondary | ICD-10-CM

## 2011-09-11 DIAGNOSIS — N760 Acute vaginitis: Secondary | ICD-10-CM | POA: Insufficient documentation

## 2011-09-11 DIAGNOSIS — N898 Other specified noninflammatory disorders of vagina: Secondary | ICD-10-CM | POA: Insufficient documentation

## 2011-09-11 DIAGNOSIS — Z01419 Encounter for gynecological examination (general) (routine) without abnormal findings: Secondary | ICD-10-CM | POA: Insufficient documentation

## 2011-09-11 DIAGNOSIS — Z113 Encounter for screening for infections with a predominantly sexual mode of transmission: Secondary | ICD-10-CM | POA: Insufficient documentation

## 2011-09-11 LAB — GLUCOSE, CAPILLARY: Glucose-Capillary: 299 mg/dL — ABNORMAL HIGH (ref 70–99)

## 2011-09-11 MED ORDER — INSULIN GLARGINE 100 UNIT/ML ~~LOC~~ SOLN: 80.0000 [IU] | Freq: Every day | SUBCUTANEOUS | Status: DC

## 2011-09-11 NOTE — Assessment & Plan Note (Signed)
Assessment: Patient notes recent increased vaginal itching. She wants refills of a steroid cream that she was prescribed last year in [place]. She was informed that I would not just prescribe her a medication without examining her. Therefore, pelvic exam was performed today with wet prep, GC, and chlamydia. Exam did show creamy grey discharge, likely most consistent with BV.  Plan:      Wet prep, GC, Chlamydia ordered - results pending.  Will treat as appropriate.

## 2011-09-11 NOTE — Patient Instructions (Signed)
   Please follow-up at the clinic in 2 weeks with the diabetes educator, Lupita Leash  Please followup in clinic in 1 month, at which time we will reevaluate your itching, diabetes, blood pressure - OR, please follow-up in the clinic sooner if needed.  There have been changes in your medications:  INCREASE your lantus to 80 units at bedtime  Start checking your blood sugar at least once a day before breakfast  Take your lantus EVERYDAY do not miss doses.   You are getting labs today, if they are abnormal I will give you a call.   If you have been started on new medication(s), and you develop symptoms concerning for allergic reaction, including, but not limited to, throat closing, tongue swelling, rash, please stop the medication immediately and call the clinic at [phone], and go to the ER.  If you are diabetic, please bring your meter to your next visit.  If symptoms worsen, or new symptoms arise, please call the clinic or go to the ER.  Please bring all of your medications in a bag to your next visit.

## 2011-09-11 NOTE — Assessment & Plan Note (Signed)
Pertinent Labs: Lab Results  Component Value Date   HGBA1C 12.4 09/11/2011   CREATININE 0.92 07/03/2011   CREATININE 1.11* 05/12/2011   MICROALBUR 1.95* 03/27/2011   MICRALBCREAT 11.0 03/27/2011   CHOL 151 05/12/2011   HDL 34* 05/12/2011   TRIG 86 05/12/2011    Assessment: Disease Control: not controlled  Progress toward goals: deteriorated  Barriers to meeting goals: nonadherence to medications and lack of understanding of disease management    Patient states that she is only missing her Lantus about one time a week.  It is completely unclear why, if she is only missing once weekly, she has had such significant deterioration of her blood sugar control. At the time when her hemoglobin A1c was less than 7, the patient had actually been taking only 35 units of Lantus instead of her 75 units.  Reportedly at this time, the patient is taking her Lantus 75 units regularly  She did not have glucometer readings available for me today.  She also has added component of difficulty with compliance with diabetic diet, secondary to financial constraints.  I am more so concerned with medication noncompliance at this time - although the patient will not clearly admits this to me today.   She states that there is no issue with obtaining medications, as now she is getting her medications the medication assistance program.   Plan: Glucometer log was not reviewed today, as pt did not have glucometer available for review.   Will escalate Lantus 80 units daily.  reminded to bring blood glucose meter & log to each visit  I want her to see Lupita Leash Plyler within the next week, to see if they can further clarify what is contributing towards her significant deterioration of blood sugar control.   Per symptomatology, there is no indication of acute  Infection, except for possible vaginal infection, which we are investigating.

## 2011-09-11 NOTE — Assessment & Plan Note (Signed)
Pertinent Data: BP Readings from Last 3 Encounters:  09/11/11 145/88  07/03/11 98/66  05/13/11 122/78    Basic Metabolic Panel:    Component Value Date/Time   NA 137 07/03/2011 0903   K 3.3* 07/03/2011 0903   CL 103 07/03/2011 0903   CO2 22 07/03/2011 0903   BUN 18 07/03/2011 0903   CREATININE 0.92 07/03/2011 0903   CREATININE 1.11* 05/12/2011 1110   GLUCOSE 263* 07/03/2011 0903   CALCIUM 8.6 07/03/2011 0903    Assessment: Disease Control: not controlled  Progress toward goals: deteriorated  Barriers to meeting goals: no barriers identified    She is missing her medications up to once weekly.    Plan:  continue current medications  Considered escalate her lisinopril/HCTZ to 2 tablets daily during next visit, if she continues to have high blood pressure.

## 2011-09-11 NOTE — Progress Notes (Signed)
Subjective:    Patient ID: Michelle Brooks   Gender: female   DOB: 1965/07/08   Age: 46 y.o.   MRN: [id number]  HPI: [name] is a 46 y.o. with a PMHx DMII, Hypertension, prior stroke, who presented to clinic today for the following:  1) DMII, A1c 12.4 today - Patient checking blood sugars occasionally 1 times daily, intermittently - hates to stick herself. Reports checking before going to bed usually 200 mg/dL - only checking 3 times a week. Currently taking Lantus 75 - missing only 1 x per week on average. 0 hypoglycemic episodes since last visit. Confirms polyuria, polydipsia, Denies nausea, vomiting, diarrhea, cough, congestion, fevers, chills.  does not request refills today. When asked what the patient thinks most likely is contributing towards her significant worsening of blood sugar control since April, she cannot specify. She does feel that diet likely plays a role, stating that due to her anion constraints, she is not always able to follow an appropriate diabetic diet, she has to eat what she can.  2) HTN - Patient does not check blood pressure regularly at home. Currently taking lisinopril-hctz . Patient misses doses 0-1 x per week on average. denies headaches, dizziness, lightheadedness, chest pain, shortness of breath.  does not request refills today.   3) Vaginal itching - patient notes recent increased vaginal itching, for which she was recently prescribed Clotrimasole-betamethasone cream in [place], where she recently traveled. She wants refills of this cream today. She states otherwise that she is not having any dysuria, hematuria, change in frequency of urination. She has not noticed any vaginal discharge. She is currently sexually active, however uses condoms every time. No known contact with gonorrhea or chlamydia, or other STIs.  4) Irregular menstrual cycle - patient notes that she's recently developed irregular menstrual cycle, that she will at times have multiple  cycles per month, and otherwise skipped months altogether. She has no other known indications of increased facial hair growth. She also is interested in family planning. She wants referral to gynecology to address these issues.   Review of Systems: Per HPI.   Current Outpatient Medications: Medication Sig  . aspirin 81 MG tablet Take 1 tablet (81 mg total) by mouth daily.  . insulin glargine (LANTUS SOLOSTAR) 100 UNIT/ML injection Inject 75 Units into the skin at bedtime. Dispense 2 boxes - needs 8 pens per month.  . lisinopril-hydrochlorothiazide (PRINZIDE) 10-12.5 MG per tablet Take 1 tablet by mouth daily.  . pravastatin (PRAVACHOL) 20 MG tablet Take 1 tablet (20 mg total) by mouth every evening.  [name] tetrahydrozoline-zinc (VISINE-AC) 0.05-0.25 % ophthalmic solution Place 2 drops into both eyes 3 (three) times daily as needed. Dry eyes    Allergies: No Known Allergies  Past Medical History  Diagnosis Date  . Diabetes mellitus type 2 in obese   . Hypertension   . 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  [name] Hyperlipidemia LDL goal < 100 04/01/2011  . Stroke     Past Surgical History  Procedure Date  . Breast lumpectomy 2002    states benign lymph node removed.  Objective:    Physical Exam: Filed Vitals:   09/11/11 1047  BP: 145/88  Pulse: 65  Temp: 97.1 F (36.2 C)     General: Vital signs reviewed and noted. Well-developed, well-nourished, in no acute distress; alert, appropriate and cooperative throughout examination.  Head: Normocephalic, atraumatic.  Lungs:  Normal respiratory  effort. Clear to auscultation BL without crackles or wheezes.  Heart: RRR. S1 and S2 normal without gallop, murmur, or rubs.  Abdomen:  BS normoactive. Soft, Nondistended, non-tender.  No masses or organomegaly.  Extremities: No pretibial edema.  Pelvic: External genitalia: No external lesions noted. Vagina: normal appearing vagina with normal color. No lesions. She did have creamy gray colored discharge. Non-malodorous. Cervix: normal appearing cervix without discharge or lesions - cervical os was difficult to find, as the patient's cervix is quite a bit angled posteriorly. Adnexa: normal adnexa in size, nontender and no masses PAP: Pap smear done today Exam was chaperoned by Lynn Ito, RN     Assessment/ Plan:   Case and plan of care discussed with Dr. Doneen Poisson.

## 2011-09-12 DIAGNOSIS — N926 Irregular menstruation, unspecified: Secondary | ICD-10-CM | POA: Insufficient documentation

## 2011-09-12 LAB — URINALYSIS, ROUTINE W REFLEX MICROSCOPIC
Hgb urine dipstick: NEGATIVE
Ketones, ur: NEGATIVE mg/dL
Leukocytes, UA: NEGATIVE
Nitrite: NEGATIVE
Protein, ur: NEGATIVE mg/dL
pH: 6 (ref 5.0–8.0)

## 2011-09-12 LAB — URINALYSIS, MICROSCOPIC ONLY
Bacteria, UA: NONE SEEN
Casts: NONE SEEN

## 2011-09-12 MED ORDER — FLUCONAZOLE 150 MG PO TABS: 150.0000 mg | ORAL_TABLET | Freq: Once | ORAL | Status: AC

## 2011-09-12 NOTE — Assessment & Plan Note (Signed)
Assessment: Patient states that she chronically has very irregular menstrual cycles, can have multiple per week or miss altogether. As well, she wants to discuss family planning - she would like GYN referral to address these issues.  Plan:      GYN referral to address irregular menses and family planning (birth control options)

## 2011-09-12 NOTE — Assessment & Plan Note (Signed)
Health Maintenance  Topic Date Due  . Pap Smear  July 15, 1965  . Tetanus/tdap  08/12/1984  . Influenza Vaccine  11/11/2011  . Hemoglobin A1c  12/12/2011  . Urine Microalbumin  03/26/2012  . Lipid Panel  05/11/2012  . Foot Exam  05/12/2012  . Ophthalmology Exam  05/20/2012  . Mammogram  06/30/2012  . Pneumococcal Polysaccharide Vaccine (#2) 03/26/2015    Assessment:  Due for PAP, Tdap  Plan:  PAP today.

## 2011-09-12 NOTE — Addendum Note (Signed)
Addended by: Priscella Mann on: 09/12/2011 07:20 PM   Modules accepted: Orders

## 2011-09-18 ENCOUNTER — Encounter: Payer: Self-pay | Admitting: Obstetrics & Gynecology

## 2011-09-22 ENCOUNTER — Encounter: Payer: Medicaid Other | Admitting: Dietician

## 2011-10-06 ENCOUNTER — Encounter: Payer: Medicaid Other | Admitting: Obstetrics & Gynecology

## 2011-10-16 ENCOUNTER — Encounter: Payer: Self-pay | Admitting: Obstetrics & Gynecology

## 2011-10-16 ENCOUNTER — Other Ambulatory Visit (HOSPITAL_COMMUNITY)
Admission: RE | Admit: 2011-10-16 | Discharge: 2011-10-16 | Disposition: A | Discharge status: Home / Self Care | Payer: Self-pay | Source: Ambulatory Visit | Attending: Obstetrics & Gynecology | Admitting: Obstetrics & Gynecology

## 2011-10-16 ENCOUNTER — Ambulatory Visit (INDEPENDENT_AMBULATORY_CARE_PROVIDER_SITE_OTHER): Payer: Self-pay | Admitting: Obstetrics & Gynecology

## 2011-10-16 VITALS — BP 126/84 | HR 88 | Temp 98.3°F | Ht 67.5 in | Wt 235.7 lb

## 2011-10-16 DIAGNOSIS — Z01812 Encounter for preprocedural laboratory examination: Secondary | ICD-10-CM

## 2011-10-16 DIAGNOSIS — Z113 Encounter for screening for infections with a predominantly sexual mode of transmission: Secondary | ICD-10-CM

## 2011-10-16 DIAGNOSIS — Z3009 Encounter for other general counseling and advice on contraception: Secondary | ICD-10-CM

## 2011-10-16 DIAGNOSIS — N924 Excessive bleeding in the premenopausal period: Secondary | ICD-10-CM

## 2011-10-16 DIAGNOSIS — N926 Irregular menstruation, unspecified: Secondary | ICD-10-CM

## 2011-10-16 LAB — POCT PREGNANCY, URINE: Preg Test, Ur: NEGATIVE

## 2011-10-16 MED ORDER — NORGESTIMATE-ETH ESTRADIOL 0.25-35 MG-MCG PO TABS: 1.0000 | ORAL_TABLET | Freq: Every day | ORAL | Status: DC

## 2011-10-16 NOTE — Progress Notes (Signed)
Subjective:     Patient ID: Michelle Brooks, female   DOB: 1965/07/31, 46 y.o.   MRN: [id number]  HPI  Pt c/o several months of irregular cycles.  Thought it was related to becoming recently sexually active.  Pt had cervical cx done by her primary care physician last month.  No abnl discharge.  No odor.  Does c/o vasomotor sx.           Review of Systems [place]     Objective:   Physical ExamBP 126/84  Pulse 88  Temp 98.3 F (36.8 C)  Ht 5' 7.5" (1.715 m)  Wt 235 lb 11.2 oz (106.913 kg)  BMI 36.37 kg/m2  LMP 10/01/2011  The indications for endometrial biopsy were reviewed.   Risks of the biopsy including cramping, bleeding, infection, uterine perforation, inadequate specimen and need for additional procedures  were discussed. The patient states she understands and agrees to undergo procedure today. Consent was signed. Time out was performed. Urine HCG was negative. A sterile speculum was placed in the patient's vagina and the cervix was prepped with Betadine. A single-toothed tenaculum was placed on the anterior lip of the cervix to stabilize it. The 3 mm pipelle was introduced into the endometrial cavity without difficulty to a depth of 8cm, and a moderate amount of tissue was obtained and sent to pathology. The instruments were removed from the patient's vagina. Minimal bleeding from the cervix was noted. The patient tolerated the procedure well. Routine post-procedure instructions were given to the patient. The patient will follow up to review the results and for further management.        Assessment:     DUB- suspect secondary to perimenopause Contraception counseling STI screen     Plan:     F/u Endo bx Sprintec 1 po q day STI screen- HIV, RPR, hepatitis panel Labs: TSH/FSH  Vail Basista L. Harraway-Smith, M.D., Evern Core

## 2011-10-16 NOTE — Addendum Note (Signed)
Addended by: Freddi Starr on: 10/16/2011 04:44 PM   Modules accepted: Orders

## 2011-10-16 NOTE — Patient Instructions (Signed)
Oral Contraception Information Oral contraceptives (OCs) are medicines taken to prevent pregnancy. OCs work by preventing the ovaries from releasing eggs. The hormones in OCs also cause the cervical mucus to thicken, preventing the sperm from entering the uterus. The hormones also cause the uterine lining to become thin, not allowing a fertilized egg to attach to the inside of the uterus. OCs are highly effective when taken exactly as prescribed. However, OCs do not prevent sexually transmitted diseases (STDs). Safe sex practices, such as using condoms along with the pill, can help prevent STDs.  Before taking the pill, you may have a physical exam and Pap test. Your caregiver may order blood tests that may be necessary. Your caregiver will make sure you are a good candidate for oral contraception. Discuss with your caregiver the possible side effects of the OC you may be prescribed. When starting an OC, it can take 2 to 3 months for the body to adjust to the changes in hormone levels in your body.  TYPES OF ORAL CONTRACEPTION  The combination pill. This pill contains estrogen and progestin (synthetic progesterone) hormones. The combination pill comes in either 21-day or 28-day packs. With 21-day packs, you do not take pills for 7 days after the last pill. With 28-day packs, the pill is taken every day. The last 7 pills are without hormones. Certain types of pills have more than 21 hormone-containing pills.   The minipill. This pill contains the progesterone hormone only. It is taken every day continuously. The minipill comes in packs of 91 pills. The first 84 pills contain the hormones, and the last 7 pills do not. The last 7 days are when you will have your menstrual period. You may experience irregular spotting.  ADVANTAGES  Decreases premenstrual symptoms.   Treats menstrual period cramps.   Regulates the menstrual cycle.   Decreases a heavy menstrual flow.   Treats acne.   Treats abnormal  uterine bleeding.   Treats chronic pelvic pain.   Treats polycystic ovarian syndrome.   Treats endometriosis.   Can be used as emergency contraception.  DISADVANTAGES OCs can be less effective if:  You forget to take the pill at the same time every day.   You have a stomach or intestinal disease that lessens the absorption of the pill.   You take OCs with other medicines that make OCs less effective.   You take expired OCs.   You forget to restart the pill on day 7, when using the packs of 21 pills.  Document Released: 04/19/2002 Document Revised: 01/16/2011 Document Reviewed: 06/05/2010 [facility] Patient Information 2012 [place], [place].Metrorrhagia  Metrorrhagia is uterine bleeding at irregular intervals, especially between menstrual periods.  CAUSES   Dysfunctional uterine bleeding.   Uterine lining growing outside the uterus (endometriosis).   Embryo adhering to uterine wall (implantation).   Pregnancy growing in the fallopian tubes (ectopic pregnancy).   Miscarriage.   Menopause.   Cancer of the reproduction organs.   Certain drugs such as hormonal contraceptives.   Inherited bleeding disorders.   Trauma.   Uterine fibroids.   Sexually transmitted diseases (STDs).   Polycystic ovarian disease.  DIAGNOSIS  A history will be taken.   A physical exam will be performed.   Other tests may include:   Blood tests.   A pregnancy test.   An ultrasound of the abdomen and pelvis.   A biopsy of the uterine lining.   AMRI or CT scan of the abdomen and pelvis.  TREATMENT Treatment will  depend on the cause. HOME CARE INSTRUCTIONS   Take all medicines as directed by your caregiver. Do not change or switch medicines without talking to your caregiver.   Take all iron supplements exactly as directed by your caregiver. Iron supplements help to replace the iron your body loses from irregular bleeding.If you become constipated, increase the amount of fiber,  fruits, and vegetables in your diet.   Do not take aspirin or medicines that contain aspirin for 1 week before your menstrual period or during your menstrual period. Aspirin may increase the bleeding.   Rest as much as possible if you change your sanitary pad or tampon more than once every 2 hours.   Eat well-balanced meals including foods high in iron, such as green leafy vegetables, red meat, liver, eggs, and whole-grain breads and cereals.   Do not try to lose weight until the abnormal bleeding is controlled and your blood iron level is back to normal.  SEEK MEDICAL CARE IF:   You have nausea and vomiting, or you cannot keep foods down.   You feel dizzy or have diarrhea while taking medicine.   You have any problems that may be related to the medicine you are taking.  SEEK IMMEDIATE MEDICAL CARE IF:   You have a fever.   You develop chills.   You become lightheaded or faint.   You need to change your sanitary pad or tampon more than once an hour.   Your bleeding becomesheavy.   You begin to pass clots or tissue.  MAKE SURE YOU:   Understand these instructions.   Will watch your condition.   Will get help right away if you are not doing well or get worse.  Document Released: 01/27/2005 Document Revised: 01/16/2011 Document Reviewed: 08/26/2010 [facility] Patient Information 2012 [place], [place].

## 2011-10-16 NOTE — Progress Notes (Signed)
C/o irregular periods since May- states may skip a period, may have 2 in one month, sometimes are heavy. C/o hot flashes. States having tingling like she did in the past before her stroke- encouraged her to discuss with primary doctor. States her medical doctor gave her medicine for what she thought was a yeast infection. States she has not noticed a vaginal discharge, odor or irritation.

## 2011-10-17 LAB — HEPATITIS PANEL, ACUTE
HCV Ab: NEGATIVE
Hep A IgM: NEGATIVE

## 2011-10-17 LAB — HIV ANTIBODY (ROUTINE TESTING W REFLEX): HIV: NONREACTIVE

## 2011-10-17 LAB — TSH: TSH: 0.701 u[IU]/mL (ref 0.350–4.500)

## 2011-10-20 ENCOUNTER — Telehealth: Payer: Self-pay | Admitting: *Deleted

## 2011-10-20 NOTE — Telephone Encounter (Signed)
Called patient and left a message to call us back. Pt returned my call. I informed her of results.

## 2011-10-20 NOTE — Telephone Encounter (Signed)
Message copied by Mannie Stabile on Mon Oct 20, 2011  9:14 AM ------      Message from: Willodean Rosenthal      Created: Sun Oct 19, 2011  9:38 AM       Please call and notify pt of neg (normal ) STI blood screen.            Thx,      clh-S

## 2011-10-21 ENCOUNTER — Telehealth: Payer: Self-pay | Admitting: *Deleted

## 2011-10-21 NOTE — Telephone Encounter (Signed)
Message copied by Jill Side on Tue Oct 21, 2011 12:41 PM ------      Message from: Willodean Rosenthal      Created: Tue Oct 21, 2011 11:29 AM       Please call pt and notify that endometrial bx is normal.            F/u as scheduled.            thx            clh-S

## 2011-10-21 NOTE — Telephone Encounter (Signed)
Called pt and informed her of Bx test results. She also asked about her lab tests which I reviewed and told her they were all normal. Her hormone level indicates she is perimenopausal and she may continue to have irregular types of bleeding until she is fully menopausal. She stated that the Dr. had put her on birth control pills. I then stated that they will help to regulate her periods. She states that the doctor wanted her to return for follow up in December. She will call the clinic in mid- November to schedule appt.  Pt voiced understanding of all information given.

## 2011-10-23 ENCOUNTER — Encounter: Payer: Self-pay | Admitting: Internal Medicine

## 2011-10-23 ENCOUNTER — Ambulatory Visit (INDEPENDENT_AMBULATORY_CARE_PROVIDER_SITE_OTHER): Payer: Self-pay | Admitting: Internal Medicine

## 2011-10-23 VITALS — BP 117/83 | HR 85 | Temp 97.9°F | Ht 68.0 in | Wt 239.4 lb

## 2011-10-23 DIAGNOSIS — R202 Paresthesia of skin: Secondary | ICD-10-CM

## 2011-10-23 DIAGNOSIS — R209 Unspecified disturbances of skin sensation: Secondary | ICD-10-CM

## 2011-10-23 DIAGNOSIS — E669 Obesity, unspecified: Secondary | ICD-10-CM

## 2011-10-23 DIAGNOSIS — Z8673 Personal history of transient ischemic attack (TIA), and cerebral infarction without residual deficits: Secondary | ICD-10-CM

## 2011-10-23 DIAGNOSIS — Z599 Problem related to housing and economic circumstances, unspecified: Secondary | ICD-10-CM

## 2011-10-23 DIAGNOSIS — E1169 Type 2 diabetes mellitus with other specified complication: Secondary | ICD-10-CM

## 2011-10-23 DIAGNOSIS — Z598 Other problems related to housing and economic circumstances: Secondary | ICD-10-CM

## 2011-10-23 DIAGNOSIS — R739 Hyperglycemia, unspecified: Secondary | ICD-10-CM

## 2011-10-23 DIAGNOSIS — E119 Type 2 diabetes mellitus without complications: Secondary | ICD-10-CM

## 2011-10-23 DIAGNOSIS — Z23 Encounter for immunization: Secondary | ICD-10-CM

## 2011-10-23 DIAGNOSIS — R7309 Other abnormal glucose: Secondary | ICD-10-CM

## 2011-10-23 DIAGNOSIS — Z0000 Encounter for general adult medical examination without abnormal findings: Secondary | ICD-10-CM

## 2011-10-23 DIAGNOSIS — I10 Essential (primary) hypertension: Secondary | ICD-10-CM

## 2011-10-23 DIAGNOSIS — K089 Disorder of teeth and supporting structures, unspecified: Secondary | ICD-10-CM

## 2011-10-23 LAB — BASIC METABOLIC PANEL
Chloride: 96 mEq/L (ref 96–112)
Potassium: 4.1 mEq/L (ref 3.5–5.3)
Sodium: 135 mEq/L (ref 135–145)

## 2011-10-23 MED ORDER — INSULIN ASPART 100 UNIT/ML ~~LOC~~ SOLN: SUBCUTANEOUS | Status: DC

## 2011-10-23 MED ORDER — ASPIRIN EC 325 MG PO TBEC: 325.0000 mg | DELAYED_RELEASE_TABLET | Freq: Every day | ORAL | Status: DC

## 2011-10-23 MED ORDER — "INSULIN SYRINGE 31G X 5/16"" 0.5 ML MISC": Status: DC

## 2011-10-23 MED ORDER — LISINOPRIL-HYDROCHLOROTHIAZIDE 10-12.5 MG PO TABS: 1.0000 | ORAL_TABLET | Freq: Every day | ORAL | Status: DC

## 2011-10-23 MED ORDER — INSULIN GLARGINE 100 UNIT/ML ~~LOC~~ SOLN: 85.0000 [IU] | Freq: Every day | SUBCUTANEOUS | Status: DC

## 2011-10-23 MED ORDER — INSULIN LISPRO 100 UNIT/ML ~~LOC~~ SOLN: SUBCUTANEOUS | Status: DC

## 2011-10-23 MED ORDER — METFORMIN HCL 500 MG PO TABS: 500.0000 mg | ORAL_TABLET | Freq: Two times a day (BID) | ORAL | Status: DC

## 2011-10-23 NOTE — Assessment & Plan Note (Signed)
Pertinent Labs: Lab Results  Component Value Date   HGBA1C 12.4 09/11/2011   CREATININE 1.09 10/23/2011   CREATININE 0.92 07/03/2011   MICROALBUR 1.95* 03/27/2011   MICRALBCREAT 11.0 03/27/2011   CHOL 151 05/12/2011   HDL 34* 05/12/2011   TRIG 86 05/12/2011    Assessment: Disease Control: not controlled  Progress toward goals: deteriorated  Barriers to meeting goals: food choice is a largely limiting factor as she has to eat at the food pantry and with food stamps (therefore cannot really buy healthy foods). As well, I wonder about medication compliance although pt is insistent that she doesn't miss her Lantus.  Microvascular complications: none  Macrovascular complications: cerebrovascular disease  On aspirin: Yes aspirin 81mg      On statin: Yes Pravastatin 20mg   On ACE-I/ ARB: Yes Lisinopril-HCTZ    Current insulin regimen is only Lantus 80 units at bedtime - this is approximately 0.7 units/ kg body weight.  Her meter could not be downloaded today and she does not have a log.   Reported blood sugars fasting 300 mg/dL on average and bedtime of 300-360 mg/dL.   Given fasting CBG elevated consistently and remains elevated throughout the day (although not particularly escalating a lot), this suggests that if she is always compliant, as she states, the patient may need additional basal insulin. However, this likely needs to be balanced by meal coverage, with her poor dietary choices certainly contributing towards hyperglycemia.  She previously experienced abd discomfort and diarrhea with metformin, but may have been a higher dose.  Patient is compliant most of the time with prescribed medications.   Plan: Glucometer log was not reviewed today, as pt did not have glucometer available for review.   Escalate Lantus to 85 units - givne we are increasing her insulin, I have advised her and provided written instruction about how to assess and deal with hypoglycemia  She must see Norm Parcel for  diabetes education, diet education within the patient's current confines financially, and for further escalation of her insulin.  Given the complexity of the patient's diabetes, as well that she has multiple, multipleconcerns with each visit, she will need to come in more frequently because our visits are requiring in excess of 1-1.5 hours to complete.   Insulin changes: Lantus increase to 85 units, add meal-time Novolog before lunch (largest meal) with a ratio of 1 unit for every 30 mg/dL above 161 mg/dL (so would start getting tx at 150 mg/dL)  Will add low-dose metformin - given priro GI disturbance, she is advised to monitor s/s and discontinue if any occur.

## 2011-10-23 NOTE — Assessment & Plan Note (Signed)
Assessment: Patient experienced transient and resolved paresthesia in left hand fingertips and in all toes on left foot approximately 5 days ago, that occurred after she slept on the arm. She has been trying to adjust the positioning. At this time, all symptoms have resolved and neurologic exam including sensation and muscle strength testing is normal. I therefore am less inclined to consider acute new neurologic event. Tentatively, my top differential diagnoses include paresthesias resultant for prolonged compression during sleep. Alternatively, given remote history of left-sided stroke, if the patient were dehydrated (secondary to osmotic diuresis from hyperglycemia), she could encounter waxing and waning of old stroke symptoms. This is considered less likely a differential given time frame to direct pressure on arm/ leg during sleep.  Plan:      Will increase aspirin to 325 mg for now.  Continue to monitor and pursue risk factor management.  Advised to return to clinic or ER if new neurological symptoms arise. The patient expresses understanding.

## 2011-10-23 NOTE — Assessment & Plan Note (Signed)
Pertinent Labs: CBG: 527 mg/dL UA Dipstick: negative for ketones, nitrites, leukocytes.  Assessment: No acute evidence of infection. Likely hyperglycemia multifactorial contributed by presumed medication noncompliance, and possible insulin resistance requiring more basal insulin, and no current meal time coverage.   Given extent of hyperglycemia, we checked orthostatic that were negative. As well, I am getting a STAT BMET and will call pt with results.  Plan:      STAT BMET  Urine dip neg for infection.

## 2011-10-23 NOTE — Assessment & Plan Note (Signed)
Pertinent Data: BP Readings from Last 3 Encounters:  10/23/11 117/83  10/16/11 126/84  09/11/11 145/88    Basic Metabolic Panel:    Component Value Date/Time   NA 135 10/23/2011 1614   K 4.1 10/23/2011 1614   CL 96 10/23/2011 1614   CO2 30 10/23/2011 1614   BUN 17 10/23/2011 1614   CREATININE 1.09 10/23/2011 1614   CREATININE 0.92 07/03/2011 0903   GLUCOSE 478* 10/23/2011 1614   CALCIUM 10.3 10/23/2011 1614    Assessment: Disease Control: not controlled  Progress toward goals: deteriorated  Barriers to meeting goals: no barriers identified    She is taking medications regularly now.  Plan:  continue current medications  Considered escalate her lisinopril/HCTZ to 2 tablets daily during next visit, if she has high blood pressure.

## 2011-10-23 NOTE — Assessment & Plan Note (Signed)
Assessment: Patient has limited finances (is requiring food stamps and food pantry for food), is not working currently because is feeling fatigued much of the time. Wants to talk with SW to about community resources and possibly getting disability - although she has no specific physical limitations from her prior stroke, and her main complaint is the fatigue.  I expressed to the patient that the fatigue and other issues need to be medically assessed (and are felt likely contributed to her very poorly controlled DM), but I don't necessary see specific disabling factor. Regardless, the patient will be referred to social work to help with guiding her to the right resources.  Plan:      SW referral for financial constraints and assessment of community resources.  SW referral for info about how to apply for disability.

## 2011-10-23 NOTE — Progress Notes (Signed)
Subjective:    Patient ID: Michelle Brooks   Gender: female   DOB: [birth year]   Age: 46 y.o.   MRN: [id number]  HPI: [name] is a 46 y.o. with a PMHx of DM2, Hypertension, prior stroke, who presented to clinic today for the following:  1) DM2 - A1c 12.4 (09/2011) - Patient checking blood sugars 2 times daily, before breakfast and at bedtime. Reports fasting blood sugars of 300 mg/dL and bedtime of 409-811 mg/dL at bedtime - has noted her blood sugars to be higher since May. Feels like this is due to the type of foods she is having to eat bc is having to go to the food pantry and eat whatever she can get. Currently taking Lantus 80 units. Patient misses doses 1 x per week on average.  0 hypoglycemic episodes since last visit, no assisted hypoglycemia. admits to polyuria, polydipsia, infrequent lightheadedness (not frequent). Denies nausea, vomiting, diarrhea.  In regards to diabetic complications:  Microvascular complications: Confirms: none ; Denies nephropathy, retinopathy and peripheral neuropathy.  Macrovascular complications: Confirms: cerebrovascular disease ; Denies cardiovascular disease and peripheral vascular disease.   Important diabetic medications: Is patient on aspirin? Yes - aspirin 81 mg daily Is patient on a statin? Yes - Pravastatin 20 mg Is patient on an ACE-I? Yes  2) HTN - Patient does not check blood pressure regularly at home. Currently taking lisinopril-hctz . Patient misses doses 0 x per week on average. denies headaches, dizziness, lightheadedness, chest pain, shortness of breath. does not request refills today.  3) Paresthesias - patient that approximately 5 days ago, she awoke with transient paresthesia involving left finger tips and in left toes - lasting approximately 1 day for fingers and toes for 1 hours. These issues have since resolved without    Review of Systems: Constitutional:  admits to fatigue Denies fever, chills, diaphoresis, appetite change  and   Respiratory: denies SOB, DOE, cough, chest tightness, and wheezing.  Cardiovascular: denies chest pain, palpitations and leg swelling.  Gastrointestinal: denies nausea, vomiting, abdominal pain, diarrhea, constipation, blood in stool.  Genitourinary: admits to polyuria denies dysuria, urgency, hematuria, flank pain and difficulty urinating.  Musculoskeletal: denies  myalgias, back pain, joint swelling, arthralgias and gait problem.   Skin: denies pallor, rash and wound.  Neurological: denies dizziness, seizures, syncope, weakness, light-headedness, numbness and headaches.       Current Outpatient Medications: Medication Sig  . aspirin 81 MG tablet Take 1 tablet (81 mg total) by mouth daily.  . insulin glargine (LANTUS SOLOSTAR) 100 UNIT/ML injection Inject 80 Units into the skin at bedtime. Dispense 2 boxes - needs 8 pens per month.  . lisinopril-hydrochlorothiazide (PRINZIDE) 10-12.5 MG per tablet Take 1 tablet by mouth daily.  . pravastatin (PRAVACHOL) 20 MG tablet Take 1 tablet (20 mg total) by mouth every evening.  . norgestimate-ethinyl estradiol (ORTHO-CYCLEN,SPRINTEC,PREVIFEM) 0.25-35 MG-MCG tablet Take 1 tablet by mouth daily.    Allergies: No Known Allergies   Past Medical History  Diagnosis Date  . Diabetes mellitus type 2 in obese   . Hypertension   . History of stroke 03/2010    Right MCA stroke in 03/2010, with MRI also noting subacute strokes in left fronto parietal area - occured in [place] received care at [facility] thought due to uncontrolled blood pressure // No residual deficits // TTE (03/2010) at [facility] - normal LV systolic function, moderate pericardial effusion with diastolic collapse - consistent with pericardial tampanode // TEE (03/2010) - no cardiac souce of  emboli.  [name] Hyperlipidemia LDL goal < 100 04/01/2011    Past Surgical History  Procedure Date  . Breast lumpectomy 2002    states benign lymph node removed.     Objective:    Physical Exam: Filed  Vitals:   10/23/11 1514  BP: 117/83  Pulse: 85  Temp:      General Exam:   Head: Normocephalic, atraumatic.  Eyes: No signs of anemia or jaundince.  Nose: Mucous membranes moist, not inflammed, nonerythematous.  Throat: Oropharynx nonerythematous, no exudate appreciated.   Neck: No deformities, masses, or tenderness noted.Supple.  Lungs:  Normal respiratory effort. Clear to auscultation BL without crackles or wheezes.  Heart: RRR. S1 and S2 normal without gallop, murmur, or rubs.  Abdomen:  BS normoactive. Soft, Nondistended, non-tender.  No masses or organomegaly.  Extremities: No pretibial edema.  Skin: No visible rashes, scars.     Neurologic Exam:   Mental Status: Alert, oriented, thought content appropriate.  Speech fluent without evidence of aphasia. Able to follow 3 step commands without difficulty.  Cranial Nerves:   II: Visual fields grossly intact.  III/IV/VI: Extraocular movements intact.  Pupils reactive bilaterally.  V/VII: Smile symmetric. facial light touch sensation normal bilaterally.  VIII: Grossly intact.  IX/X: Normal gag.  XI: Bilateral shoulder shrug normal.  XII: Midline tongue extension normal.  Motor:  5/5 bilaterally with normal tone and bulk  Sensory:  Light touch intact throughout, bilaterally     Assessment/ Plan:   Case and plan of care discussed with Dr. Jonah Blue.    Greater than 60 minutes was spent in face-to-face time to obtain patient history and provide education.

## 2011-10-23 NOTE — Patient Instructions (Addendum)
Please follow-up at the clinic in 2 weeks, at which time we will reevaluate diabetes,  Blood pressure, cholesterol - OR, please follow-up in the clinic sooner if needed.  You will be called by the social worker to discuss community resources for food, support, etc.  There have been changes in your medications:  Increase lantus to 85 units  Start humalog (per sliding scale)  Check blood sugars 3 times daily before each meal  Increase your aspirin to 325 mg daily  Start metformin 500 mg twice daily.  Sliding scale insulin Check your blood sugar before lunch, and use the sliding scale insulin below to inject Novolog 15 minutes before your meal: Blood sugar 150-180 - inject 1 unit  Blood sugar 181-210 - inject 2 units Blood sugar 211-240 - inject 3 units Blood sugar 241-270 - inject 4 units Blood sugar 271-300 - inject 5 units Blood sugar 301-330 - inject 6 units Blood sugar 331-360- inject 7 units Blood sugar 361-390 - inject 8 units Blood sugar > 400 - call clinic.   You are getting labs today, if they are abnormal I will give you a call.   You need to see the diabetes educator - Lupita Leash Plyler  Your insulin is being increased today - this can possibly result in low blood sugars (< 70 mg/dL) - use the instruction sheet below to address if you have low blood sugars.  If you have been started on new medication(s), and you develop symptoms concerning for allergic reaction, including, but not limited to, throat closing, tongue swelling, rash, please stop the medication immediately and call the clinic at [phone], and go to the ER.  If you are feeling worse, have new weakness, numbness, tingling, please go to the ER.  If you are diabetic, please bring your meter to your next visit.  Please bring all of your medications in a bag to your next visit.   Hypoglycemia (Low Blood Sugar) Hypoglycemia is when the glucose (sugar) in your blood is too low. Hypoglycemia can happen for many  reasons. It can happen to people with or without diabetes. Hypoglycemia can develop quickly and can be a medical emergency.    CAUSES   Having hypoglycemia does not mean that you will develop diabetes. Different causes include: Missed or delayed meals or not enough carbohydrates eaten.  Medication overdose. This could be by accident or deliberate. If by accident, your medication may need to be adjusted or changed.  Exercise or increased activity without adjustments in carbohydrates or medications.  A nerve disorder that affects body functions like your heart rate, blood pressure and digestion (autonomic neuropathy).  A condition where the stomach muscles do not function properly (gastroparesis). Therefore, medications may not absorb properly.  The inability to recognize the signs of hypoglycemia (hypoglycemic unawareness).  Absorption of insulin - may be altered.  Alcohol consumption.  Pregnancy/menstrual cycles/postpartum. This may be due to hormones.  Certain kinds of tumors. This is very rare.   SYMPTOMS   Sweating.  Hunger.  Dizziness.  Blurred vision.  Drowsiness.  Weakness.  Headache.  Rapid heart beat.  Shakiness.  Nervousness.   DIAGNOSIS   Diagnosis is made by monitoring blood glucose in one or all of the following ways: Fingerstick blood glucose monitoring.  Laboratory results.   TREATMENT   If you think your blood glucose is low: Check your blood glucose, if possible. If it is less than 70 mg/dl, take one of the following:  3-4 glucose tablets.  cup juice (prefer clear like apple).   cup "regular" soda pop.  1 cup milk.  -1 tube of glucose gel.  5-6 hard candies.  Do not over treat because your blood glucose (sugar) will only go too high.  Wait 15 minutes and recheck your blood glucose. If it is still less than 70 mg/dl (or below your target range), repeat treatment.  Eat a snack if it is more than one hour until your next meal.  Sometimes, your blood  glucose may go so low that you are unable to treat yourself. You may need someone to help you. You may even pass out or be unable to swallow. This may require you to get an injection of glucagon, which raises the blood glucose.  HOME CARE INSTRUCTIONS Check blood glucose as recommended by your caregiver.  Take medication as prescribed by your caregiver.  Follow your meal plan. Do not skip meals. Eat on time.  If you are going to drink alcohol, drink it only with meals.  Check your blood glucose before driving.  Check your blood glucose before and after exercise. If you exercise longer or different than usual, be sure to check blood glucose more frequently.  Always carry treatment with you. Glucose tablets are the easiest to carry.  Always wear medical alert jewelry or carry some form of identification that states that you have diabetes. This will alert people that you have diabetes. If you have hypoglycemia, they will have a better idea on what to do.   SEEK MEDICAL CARE IF:   You are having problems keeping your blood sugar at target range.  You are having frequent episodes of hypoglycemia.  You feel you might be having side effects from your medicines.  You have symptoms of an illness that is not improving after 3-4 days.  You notice a change in vision or a new problem with your vision.   SEEK IMMEDIATE MEDICAL CARE IF:   You are a family member or friend of a person whose blood glucose goes below 70 mg/dl and is accompanied by:  Confusion.  A change in mental status.  The inability to swallow.  Passing out.  Document Released: 01/27/2005 Document Revised: 01/16/2011 Document Reviewed: 09/21/2008 [facility] Patient Information 2012 [place], [place].

## 2011-10-23 NOTE — Assessment & Plan Note (Signed)
Assessment: Poor dentition especially on left lower, without evidence of abscess formation.  Plan:      Dentistry referral.

## 2011-10-23 NOTE — Assessment & Plan Note (Signed)
Health Maintenance  Topic Date Due  . Hemoglobin A1c  12/12/2011  . Urine Microalbumin  03/26/2012  . Lipid Panel  05/11/2012  . Foot Exam  05/12/2012  . Ophthalmology Exam  05/20/2012  . Mammogram  06/30/2012  . Pap Smear  09/10/2012  . Influenza Vaccine  11/10/2012  . Pneumococcal Polysaccharide Vaccine (#2) 03/26/2015  . Tetanus/tdap  10/22/2021    Assessment:  Due for Tdap, flu shot  Plan:  Tdap/ flu shot today.

## 2011-10-24 ENCOUNTER — Telehealth: Payer: Self-pay | Admitting: *Deleted

## 2011-10-24 MED ORDER — METFORMIN HCL 500 MG PO TABS: 500.0000 mg | ORAL_TABLET | Freq: Two times a day (BID) | ORAL | Status: DC

## 2011-10-24 NOTE — Addendum Note (Signed)
Addended by: Priscella Mann on: 10/24/2011 09:58 AM   Modules accepted: Orders

## 2011-10-24 NOTE — Telephone Encounter (Signed)
i spoke w/ pt twice today, called her total of 4 times, she did not attempt to get newly ordered meds, i have spoken to health dept and made arrangements at [facility] for pt to get 2 meds for free- metformin and lisinopril, i called her back and left a message, she will be called Monday and will ask her to please go to pharmacies and pick up, hopefully [facility] will [facility] tomorrow

## 2011-10-24 NOTE — Telephone Encounter (Signed)
Thank you for all of your efforts.  Sincerely,  Johnette Abraham, D.O.

## 2011-10-27 NOTE — Telephone Encounter (Signed)
Spoke w/ pt she is in her cat going to pick up meds

## 2011-10-28 ENCOUNTER — Telehealth: Payer: Self-pay | Admitting: Licensed Clinical Social Worker

## 2011-10-28 NOTE — Telephone Encounter (Signed)
[name] was referred to CSW for financial difficulties.  CSW placed call to [name].  CSW inquired about [name]'s current financial needs as voiced during last [facility] visit.  Pt states "Um, I'm doing fine.  I don't need anything.  Thanks for calling anyway.".  Pt denies needs.  Pt aware CSW is available. CSW will sign off.

## 2011-11-11 ENCOUNTER — Telehealth: Payer: Self-pay | Admitting: Dietician

## 2011-11-11 NOTE — Telephone Encounter (Signed)
Per [facility]:  Patient on Lantus 85 units at bedtime : On 09/29/11 got 88 day supply  Novolog flexpen: got  1 pen on 10/27/11 - use sliding scale coverage before lunch   [facility] requests scale numbers to dispense appropriate amounts. CDE read them Dr. Saralyn Pilar correction scale isntructions from 10/23/11 note.

## 2011-11-12 NOTE — Telephone Encounter (Signed)
Appointment scheduled with CDE by triage nurse for Thursday at 3 PM.

## 2011-11-13 ENCOUNTER — Ambulatory Visit (INDEPENDENT_AMBULATORY_CARE_PROVIDER_SITE_OTHER): Payer: Self-pay | Admitting: Internal Medicine

## 2011-11-13 ENCOUNTER — Ambulatory Visit (INDEPENDENT_AMBULATORY_CARE_PROVIDER_SITE_OTHER): Payer: Self-pay | Admitting: Dietician

## 2011-11-13 VITALS — BP 124/83 | HR 73 | Temp 98.1°F | Ht 68.0 in | Wt 239.9 lb

## 2011-11-13 DIAGNOSIS — E669 Obesity, unspecified: Secondary | ICD-10-CM

## 2011-11-13 DIAGNOSIS — Z598 Other problems related to housing and economic circumstances: Secondary | ICD-10-CM

## 2011-11-13 DIAGNOSIS — E118 Type 2 diabetes mellitus with unspecified complications: Secondary | ICD-10-CM

## 2011-11-13 DIAGNOSIS — E1169 Type 2 diabetes mellitus with other specified complication: Secondary | ICD-10-CM

## 2011-11-13 DIAGNOSIS — I10 Essential (primary) hypertension: Secondary | ICD-10-CM

## 2011-11-13 LAB — GLUCOSE, CAPILLARY: Glucose-Capillary: 416 mg/dL — ABNORMAL HIGH (ref 70–99)

## 2011-11-13 MED ORDER — INSULIN ASPART 100 UNIT/ML ~~LOC~~ SOLN: SUBCUTANEOUS | Status: DC

## 2011-11-13 MED ORDER — METFORMIN HCL 500 MG PO TABS: 250.0000 mg | ORAL_TABLET | Freq: Two times a day (BID) | ORAL | Status: DC

## 2011-11-13 NOTE — Patient Instructions (Signed)
Can consider cutting metfomrin in half and taking 250 mg with breakfast and dinner if ojkay with Dr, Thad Ranger.  Please check blood sugar before and about 2 hours after meals on 1 day and also jot down what you eat and drink and bring this back to your next visit with me.  Please make a follow up within the next 4 weeks at your convenience- either on same day or differnt day as your doctor.

## 2011-11-13 NOTE — Progress Notes (Signed)
Diabetes Self-Management Training (DSMT)  Initial Visit  11/13/2011 [name], identified by name and date of birth, is a 46 y.o. female with Type 2 Diabetes. Year of diabetes diagnosis: 2006 Other persons present: spouse/SO- Michelle Brooks  ASSESSMENT Patient concerns are Glycemic control.  Last menstrual period 10/01/2011. There is no height or weight on file to calculate BMI. Lab Results  Component Value Date   LDLCALC 100* 05/12/2011   Lab Results  Component Value Date   HGBA1C 12.4 09/11/2011  Labs reviewed. Family history of diabetes: Yes Support systems: significant other and daughter Special needs: None Prior DM Education: Not sure how much Patients belief/attitude about diabetes: Diabetes can be controlled. Self foot exams Brooks: Not sure- will assess at follow up Diabetes Complications: will assess at follow up   Medications See Medications list.  Is interested in learning more   Exercise Plan Doing ADLs .   Self-Monitoring Monitor: Michelle Brooks Frequency of testing: 3-5 times/week Breakfast/Lunch: 132- 400 range  Hyperglycemia: Yes Weekly Hypoglycemia: Not assessed today   Meal Planning Some knowledge   Assessment comments: short visit today, pt agreed to follow up with CBGs and food record x 1 day    INDIVIDUAL DIABETES EDUCATION PLAN:  Medication Monitoring _______________________________________________________________________  Intervention TOPICS COVERED TODAY:  Monitoring  Purpose and frequency of SMBG. Identified appropriate SMBG and A1C goals. Psychosocial adjustment  Worked with patient to identify barriers to care and solutions Helped patient identify a support system for diabetes management Identified and addressed patients feelings and concerns about diabetes  PATIENTS GOALS/PLAN (copy and paste in patient instructions so patient receives a copy): 1.  Learning Objective:       State importance of self monitoring 2.  Behavioral  Objective:         Monitoring: To identify blood glucose trends, I will test blood glucose pre and post meals for 1 day and record food and beverages on same day and bring to next visit with CDE Never 0%  Personalized Follow-Up Plan for Ongoing Self Management Support:  Doctor's Office, friends, family and CDE visits ______________________________________________________________________   Outcomes Expected outcomes: Demonstrated interest in learning.Expect positive changes in lifestyle. Self-care Barriers: Lack of material resources Education material provided: yes- record for CBgs and food Patient to contact team via Phone if problems or questions. Time in: 1521     Time out: 1548 Future DSMT - 4-6 wks   Plyler, Michelle Brooks

## 2011-11-13 NOTE — Progress Notes (Signed)
Subjective:    Patient: Michelle Brooks   Age/ Gender: 46 y.o., female   MRN: [id number]  DOB: [birth year]     HPI: [name] is a 46 y.o. with a PMHx of PMHx of uncontrolled DM2, Hypertension, prior stroke, who presented to clinic today for the following:  1) DM2 - A1c 12.4 (09/2011) - Patient checking blood sugars only occasionally < 10 times since last visit on 09/12. Reports blood sugars remain high in 300-400s mg/dL. She did not follow the instructions previously recommended, therefore remains on Lantus 80 units (instead of recommended 85 units), did not start Novolog, and attempted the Metformin 500 mg BID but resulted in nausea, therefore, she has reduced it to 500mg  once daily with food and still has some nausea. Does not understand why her blood sugars remain high. Hopefully, does believe there is a contribution of the type of foods she is having to eat bc is having to go to the food pantry and eat whatever she can get. Patient misses doses 1 x per week on average.  0 hypoglycemic episodes since last visit, no assisted hypoglycemia. admits to polyuria, polydipsia, infrequent lightheadedness (not frequent). Denies nausea, vomiting, diarrhea.  In regards to diabetic complications:  Microvascular complications: Confirms: none ; Denies nephropathy, retinopathy and peripheral neuropathy.  Macrovascular complications: Confirms: cerebrovascular disease ; Denies cardiovascular disease and peripheral vascular disease.   Important diabetic medications: Is patient on aspirin? Yes - aspirin 325 mg daily Is patient on a statin? Yes - Pravastatin 20 mg Is patient on an ACE-I? Yes  2) HTN - Patient does not check blood pressure regularly at home. Currently taking lisinopril-hctz . Patient misses doses 0 x per week on average. denies headaches, dizziness, lightheadedness, chest pain, shortness of breath. does not request refills today.  Review of Systems: Per HPI.   Current Outpatient  Medications: . aspirin EC 325 MG tablet Take 1 tablet (325 mg total) by mouth daily.  . insulin glargine (LANTUS SOLOSTAR) 100 UNIT/ML injection Inject 85 Units into the skin at bedtime. Dispense 2 boxes - needs 8 pens per month.  . Insulin Syringe-Needle U-100 (INSULIN SYRINGE .5CC/31GX5/16") 31G X 5/16" 0.5 ML MISC Inject insulin before lunch time.  [name] lisinopril-hydrochlorothiazide (PRINZIDE) 10-12.5 MG per tablet Take 1 tablet by mouth daily.  . metFORMIN (GLUCOPHAGE) 500 MG tablet Take 1 tablet (500 mg total) by mouth once daily with a meal.  . pravastatin (PRAVACHOL) 20 MG tablet Take 1 tablet (20 mg total) by mouth every evening.    Allergies: No Known Allergies   Past Medical History  Diagnosis Date  . Diabetes mellitus type 2 in obese   . Hypertension   . History of stroke 03/2010    Right MCA stroke in 03/2010, with MRI also noting subacute strokes in left fronto parietal area - occured in [place] received care at [facility] thought due to uncontrolled blood pressure // No residual deficits // TTE (03/2010) at [facility] - normal LV systolic function, moderate pericardial effusion with 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  [name] Hyperlipidemia LDL goal < 100 04/01/2011    Past Surgical History  Procedure Date  . Breast lumpectomy 2002    states benign lymph node removed.     Objective:    Physical Exam: Filed Vitals:   11/13/11 1555  BP: 124/83  Pulse: 73  Temp: 98.1 F (36.7 C)      Head: Normocephalic, atraumatic.  Eyes:  No signs of anemia or jaundince.  Neck: No deformities, masses, or tenderness noted.Supple.  Lungs:  Normal respiratory effort. Clear to auscultation BL without crackles or wheezes.  Heart: RRR. S1 and S2 normal without gallop, murmur, or rubs.  Abdomen:  BS normoactive. Soft, Nondistended, non-tender.  No masses or organomegaly.  Extremities: No pretibial edema.  Skin: No visible rashes,  scars.    Assessment/ Plan:   Case and plan of care discussed with attending physician, Dr. Jonah Brooks.

## 2011-11-13 NOTE — Patient Instructions (Addendum)
Please follow-up at the clinic in 2 weeks, at which time we will reevaluate diabetes,  Blood pressure, cholesterol - OR, please follow-up in the clinic sooner if needed.  You will be called by the social worker to discuss community resources for food, support, etc.  There have been changes in your medications:  Increase lantus to 85 units  Start humalog (per sliding scale)  Check blood sugars 3 times daily before each meal  Decrease metformin to 250mg  twice daily. If this is still causing nausea, go down to one daily, if still causing nausea, stop.  Sliding scale insulin Check your blood sugar before lunch, and use the sliding scale insulin below to inject Novolog 15 minutes before your meal: Blood sugar 150-180 - inject 1 unit  Blood sugar 181-210 - inject 2 units Blood sugar 211-240 - inject 3 units Blood sugar 241-270 - inject 4 units Blood sugar 271-300 - inject 5 units Blood sugar 301-330 - inject 6 units Blood sugar 331-360- inject 7 units Blood sugar 361-390 - inject 8 units Blood sugar > 400 - call clinic.   You are getting labs today, if they are abnormal I will give you a call.   You need to see the diabetes educator - Lupita Leash Plyler  Your insulin is being increased today - this can possibly result in low blood sugars (< 70 mg/dL) - use the instruction sheet below to address if you have low blood sugars.  If you have been started on new medication(s), and you develop symptoms concerning for allergic reaction, including, but not limited to, throat closing, tongue swelling, rash, please stop the medication immediately and call the clinic at [phone], and go to the ER.  If you are feeling worse, have new weakness, numbness, tingling, please go to the ER.  If you are diabetic, please bring your meter to your next visit.  Please bring all of your medications in a bag to your next visit.   Hypoglycemia (Low Blood Sugar) Hypoglycemia is when the glucose (sugar) in your  blood is too low. Hypoglycemia can happen for many reasons. It can happen to people with or without diabetes. Hypoglycemia can develop quickly and can be a medical emergency.    CAUSES   Having hypoglycemia does not mean that you will develop diabetes. Different causes include: Missed or delayed meals or not enough carbohydrates eaten.  Medication overdose. This could be by accident or deliberate. If by accident, your medication may need to be adjusted or changed.  Exercise or increased activity without adjustments in carbohydrates or medications.  A nerve disorder that affects body functions like your heart rate, blood pressure and digestion (autonomic neuropathy).  A condition where the stomach muscles do not function properly (gastroparesis). Therefore, medications may not absorb properly.  The inability to recognize the signs of hypoglycemia (hypoglycemic unawareness).  Absorption of insulin - may be altered.  Alcohol consumption.  Pregnancy/menstrual cycles/postpartum. This may be due to hormones.  Certain kinds of tumors. This is very rare.   SYMPTOMS   Sweating.  Hunger.  Dizziness.  Blurred vision.  Drowsiness.  Weakness.  Headache.  Rapid heart beat.  Shakiness.  Nervousness.   DIAGNOSIS   Diagnosis is made by monitoring blood glucose in one or all of the following ways: Fingerstick blood glucose monitoring.  Laboratory results.   TREATMENT   If you think your blood glucose is low: Check your blood glucose, if possible. If it is less than 70 mg/dl, take one of  the following:  3-4 glucose tablets.   cup juice (prefer clear like apple).   cup "regular" soda pop.  1 cup milk.  -1 tube of glucose gel.  5-6 hard candies.  Do not over treat because your blood glucose (sugar) will only go too high.  Wait 15 minutes and recheck your blood glucose. If it is still less than 70 mg/dl (or below your target range), repeat treatment.  Eat a snack if it is more than one hour  until your next meal.  Sometimes, your blood glucose may go so low that you are unable to treat yourself. You may need someone to help you. You may even pass out or be unable to swallow. This may require you to get an injection of glucagon, which raises the blood glucose.  HOME CARE INSTRUCTIONS Check blood glucose as recommended by your caregiver.  Take medication as prescribed by your caregiver.  Follow your meal plan. Do not skip meals. Eat on time.  If you are going to drink alcohol, drink it only with meals.  Check your blood glucose before driving.  Check your blood glucose before and after exercise. If you exercise longer or different than usual, be sure to check blood glucose more frequently.  Always carry treatment with you. Glucose tablets are the easiest to carry.  Always wear medical alert jewelry or carry some form of identification that states that you have diabetes. This will alert people that you have diabetes. If you have hypoglycemia, they will have a better idea on what to do.   SEEK MEDICAL CARE IF:   You are having problems keeping your blood sugar at target range.  You are having frequent episodes of hypoglycemia.  You feel you might be having side effects from your medicines.  You have symptoms of an illness that is not improving after 3-4 days.  You notice a change in vision or a new problem with your vision.   SEEK IMMEDIATE MEDICAL CARE IF:   You are a family member or friend of a person whose blood glucose goes below 70 mg/dl and is accompanied by:  Confusion.  A change in mental status.  The inability to swallow.  Passing out.  Document Released: 01/27/2005 Document Revised: 01/16/2011 Document Reviewed: 09/21/2008 [facility] Patient Information 2012 [place], [place].

## 2011-11-14 ENCOUNTER — Encounter: Payer: Self-pay | Admitting: Internal Medicine

## 2011-11-14 NOTE — Assessment & Plan Note (Signed)
Assessment: Patient has limited finances (is requiring food stamps and food pantry for food), is not working currently because is feeling fatigued much of the time. Wants to talk with SW to about community resources and possibly getting disability - although she has no specific physical limitations from her prior stroke, and her main complaint is the fatigue.  I expressed to the patient that the fatigue and other issues need to be medically assessed (and are felt likely contributed to her very poorly controlled DM), but I don't necessary see specific disabling factor. Regardless, the patient will be referred to social work to help with guiding her to the right resources.  She was referred at last visit, and states "she must have been in a daze" when the social worker called her last time, because she does want community resource information. As well, she would be open to [facility] help if she qualifies.   Plan:      SW referral for financial constraints and assessment of community resources.  [facility] brochure given to patient.

## 2011-11-14 NOTE — Assessment & Plan Note (Signed)
Pertinent Labs: Lab Results  Component Value Date   HGBA1C 12.4 09/11/2011   CREATININE 1.09 10/23/2011   CREATININE 0.92 07/03/2011   MICROALBUR 1.95* 03/27/2011   MICRALBCREAT 11.0 03/27/2011   CHOL 151 05/12/2011   HDL 34* 05/12/2011   TRIG 86 05/12/2011    Assessment: Disease Control: not controlled  Progress toward goals: deteriorated  Barriers to meeting goals: Patient has not made any of the recommended changes from the last visit in terms of esclation of her Lantus, addition of Novolog. Is having nausea with Metformin.  Microvascular complications: none  Macrovascular complications: cerebrovascular disease  On aspirin: Yes aspirin 325 mg     On statin: Yes Pravastatin 20mg   On ACE-I/ ARB: Yes Lisinopril-HCTZ    Current insulin regimen continues to be only Lantus 80 units at bedtime - this is approximately 0.7 units/ kg body weight.  Her meter shows she is rarely checking her blood sugars.   There seems to be some disconnect in terms of understanding why she is on insulin and how to achieve better blood sugar control. Patient states she is trying hard and doesn't know why blood sugars are not improving, but has not made any recommended changes.  Is experiencing nausea with metformin, therefore, has reduced to 500mg  once daily  Patient states she is compliant most of the time with prescribed medications.   Plan: Glucometer log was reviewed today, as pt did not have glucometer available for review - but insufficient data to make any conclusions except that she remains hyperglycemic.   Escalate Lantus to 85 units - given we are increasing her insulin, I have advised her and provided written instruction about how to assess and deal with hypoglycemia  She must see Lupita Leash Plyler fo rcontinued  diabetes education, diet education within the patient's current confines financially, and for further escalation of her insulin.  Given the complexity of the patient's diabetes, as well that she  has multiple, multipleconcerns with each visit, she will need to come in more frequently.  Insulin changes: Lantus increase to 85 units, add meal-time Novolog before lunch (largest meal) with a ratio of 1 unit for every 30 mg/dL above 409 mg/dL (so would start getting tx at 150 mg/dL)  Will continue low-dose metformin - given nausea, she is advised to reduce her dosage to 250mg  BID, and can deescalate further if symptoms unresolved.  We had a long discussion about the reasoning behind the current diabetic regimen, and ultimately all of the complications that will arise if she does not achieve better blood sugar control.  We will need to continue strong discussions about diabetic education.

## 2011-11-14 NOTE — Assessment & Plan Note (Signed)
Pertinent Data: BP Readings from Last 3 Encounters:  11/13/11 124/83  10/23/11 117/83  10/16/11 126/84    Basic Metabolic Panel:    Component Value Date/Time   NA 135 10/23/2011 1614   K 4.1 10/23/2011 1614   CL 96 10/23/2011 1614   CO2 30 10/23/2011 1614   BUN 17 10/23/2011 1614   CREATININE 1.09 10/23/2011 1614   CREATININE 0.92 07/03/2011 0903   GLUCOSE 478* 10/23/2011 1614   CALCIUM 10.3 10/23/2011 1614    Assessment: Disease Control: not controlled  Progress toward goals: deteriorated  Barriers to meeting goals: no barriers identified    She is taking medications regularly now.  Plan:  continue current medications

## 2011-11-19 ENCOUNTER — Encounter: Payer: Self-pay | Admitting: Licensed Clinical Social Worker

## 2011-11-19 ENCOUNTER — Telehealth: Payer: Self-pay | Admitting: Licensed Clinical Social Worker

## 2011-11-19 NOTE — Telephone Encounter (Signed)
Michelle Brooks was referred to CSW for multiple psychosocial issues.  CSW placed call to Michelle Brooks.  Michelle Brooks states she remains having difficulty managing her blood sugar with her diabetes. Michelle Brooks complains of being tired "all of the time" and not able to work because of fatique.  Michelle Brooks has meet with the [facility] diabetic educator.  Michelle Brooks receives $65/month in foods stamps and utilizes the [employer].  CSW discussed the process for applying for disability and provided Michelle Brooks with the phone number to [facility].  CSW will provide Michelle Brooks with add'l listing of [place] pantry locations and hot meal locations.  CSW discussed [facility], Michelle Brooks has [employer] card and in agreement to referral.  CSW discussed [facility] nutrition program.  Referral to [facility] completed.  Michelle Brooks aware CSW is available to assist as needed.  Michelle Brooks denies add'l needs at this time.

## 2011-11-28 ENCOUNTER — Encounter: Payer: Self-pay | Admitting: Internal Medicine

## 2011-11-28 ENCOUNTER — Telehealth: Payer: Self-pay | Admitting: *Deleted

## 2011-11-28 ENCOUNTER — Ambulatory Visit (INDEPENDENT_AMBULATORY_CARE_PROVIDER_SITE_OTHER): Payer: Self-pay | Admitting: Internal Medicine

## 2011-11-28 VITALS — BP 132/78 | HR 77 | Temp 97.4°F | Ht 68.0 in | Wt 244.7 lb

## 2011-11-28 DIAGNOSIS — E1165 Type 2 diabetes mellitus with hyperglycemia: Secondary | ICD-10-CM

## 2011-11-28 MED ORDER — INSULIN GLARGINE 100 UNIT/ML ~~LOC~~ SOLN: 90.0000 [IU] | Freq: Every day | SUBCUTANEOUS | Status: DC

## 2011-11-28 MED ORDER — INSULIN ASPART 100 UNIT/ML ~~LOC~~ SOLN: SUBCUTANEOUS | Status: DC

## 2011-11-28 NOTE — Telephone Encounter (Signed)
Pt states she may be late today, she is cautioned that she must be here by 1330

## 2011-11-28 NOTE — Progress Notes (Signed)
Subjective:   Patient ID: Michelle Brooks female   DOB: 08/06/1965 46 y.o.   MRN: [id number]  HPI: [name] is a 46 y.o. woman with h/o DM, HTN, CVA and HLD who presents for DM follow up.  At last visit, they increased lantus to 85u & added meal time insulin in addition to low dose metformin.  Regarding DM, last A1c = 12.4 in 09/2011  AM fasting sugars mid 200s. No record of low CBGs. Hates to stick herself.  Takes lantus 85u qHS.  Hasn't started meal time insulin because pharmacy never called her. Breakfast: cheese/eggs/sausage/bacon, pancake  Lunch: cheeseburger/sandwhiches/fries without salt Dinner: oxtail, collard greens, stuffing   Past Medical History  Diagnosis Date  . Diabetes mellitus type 2 in obese   . Hypertension   . History of stroke 03/2010    Right MCA stroke in 03/2010, with MRI also noting subacute strokes in left fronto parietal area - occured in [place] received care at [facility] thought due to uncontrolled blood pressure // No residual deficits // TTE (03/2010) at [facility] - normal LV systolic function, moderate pericardial effusion with 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  [name] Hyperlipidemia LDL goal < 100 04/01/2011   Current Outpatient Prescriptions  Medication Sig Dispense Refill  . aspirin EC 325 MG tablet Take 1 tablet (325 mg total) by mouth daily.  100 tablet  3  . insulin aspart (NOVOLOG FLEXPEN) 100 UNIT/ML injection Per sliding scale on your paperwork.  1 vial  12  . insulin glargine (LANTUS) 100 UNIT/ML injection Inject 80 Units into the skin at bedtime. Dispense 2 boxes - needs 8 pens per month.      . Insulin Syringe-Needle U-100 (INSULIN SYRINGE .5CC/31GX5/16") 31G X 5/16" 0.5 ML MISC Inject insulin before lunch time.  100 each  0  . lisinopril-hydrochlorothiazide (PRINZIDE) 10-12.5 MG per tablet Take 1 tablet by mouth daily.  30 tablet  5  . metFORMIN (GLUCOPHAGE) 500 MG tablet Take 0.5  tablets (250 mg total) by mouth 2 (two) times daily with a meal.      . pravastatin (PRAVACHOL) 20 MG tablet Take 1 tablet (20 mg total) by mouth every evening.  30 tablet  5   Family History  Problem Relation Age of Onset  . Diabetes Mother   . Stroke Mother 19  . Diabetes Brother   . Diabetes Brother   . Cancer Brother 32    unknown type  . Hypertension Brother    History   Social History  . Marital Status: Divorced    Spouse Name: N/A    Number of Children: 3  . Years of Education: 12th grade   Occupational History  . 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 Smoking status: Former Smoker -- 0.2 packs/day for 7 years    Types: Cigarettes    Quit date: 03/26/2004  . Smokeless tobacco: Never Used  . Alcohol Use: Yes     social rarely  . Drug Use: No  . Sexually Active: Yes -- Female partner(s)    Birth Control/ Protection: Condom   Other Topics Concern  . Not on file   Social History Narrative   Recently moved from [place] in 10/2010 due to wanting a change from stressors.Previously followed by Dr. Meyer Cory at [facility]   Review of Systems: General: no fevers, chills, changes in weight, changes in  appetite Skin: no rash HEENT: no blurry vision, hearing changes, sore throat Pulm: no dyspnea, coughing, wheezing CV: no chest pain, palpitations, shortness of breath Abd: no abdominal pain, nausea/vomiting, diarrhea/constipation GU: no dysuria, hematuria Ext: no arthralgias, myalgias Neuro: no weakness    Objective:  Physical Exam: Filed Vitals:   11/28/11 1347  BP: 132/78  Pulse: 77  Temp: 97.4 F (36.3 C)  TempSrc: Oral  Height: 5\' 8"  (1.727 m)  Weight: 244 lb 11.2 oz (110.995 kg)  SpO2: 99%   Constitutional: Vital signs reviewed.  Patient is an obese woman in no acute distress and cooperative with exam.  Mouth: no erythema or exudates, MMM Eyes: PERRL, EOMI, conjunctivae normal, No scleral icterus.    Cardiovascular: RRR, S1 normal, S2 normal, no MRG, pulses symmetric and intact bilaterally Pulmonary/Chest: CTAB, no wheezes, rales, or rhonchi Abdominal: Soft. Non-tender, non-distended, bowel sounds are normal, no masses, organomegaly, or guarding present.   Neurological: A&O x3 Skin: Warm, dry and intact. No rash, cyanosis, or clubbing.  Psychiatric: Normal mood and affect. speech and behavior is normal. Judgment and thought content normal. Cognition and memory are normal.   Assessment & Plan:  Case and care discussed with Dr. Dalphine Handing. Patient to return in 2 weeks regarding DM since all changes not implemented prior to today's visit. Please see problem oriented charting for further details.

## 2011-11-28 NOTE — Patient Instructions (Signed)
Please follow-up at the clinic in 2 weeks, at which time we will reevaluate diabetes - OR, please follow-up in the clinic sooner if needed.   There have been changes in your medications:  Increase lantus to 90 units  Start humalog (per sliding scale)  Check blood sugars 3 times daily before each meal - please be sure to check sugars when you have a headache as well, though your headache may be related to caffeine withdrawal.  Sliding scale insulin - I have sent this prescription to the health department again, if you have any difficulties acquiring the medication, please call the clinic! Check your blood sugar before lunch, and use the sliding scale insulin below to inject Novolog 15 minutes before your meal:  Blood sugar 150-180 - inject 1 unit  Blood sugar 181-210 - inject 2 units  Blood sugar 211-240 - inject 3 units  Blood sugar 241-270 - inject 4 units  Blood sugar 271-300 - inject 5 units  Blood sugar 301-330 - inject 6 units  Blood sugar 331-360- inject 7 units  Blood sugar 361-390 - inject 8 units  Blood sugar > 400 - call clinic.  Please be sure to bring all of your medications with you to every visit.  Should you have any new or worsening symptoms, please be sure to call the clinic at [phone].

## 2011-11-28 NOTE — Assessment & Plan Note (Signed)
Patient did not start novolog because she says the pharmacy never called her.  I refaxed the prescription and told her to call us in the next 1-2 days if she is unable to acquire the insulin.  I told her not to go another 2 weeks without novolog.  I also provided sliding scale instructions again.  She should increase lantus to 90u qHS as AM CBGs continue to be elevated >200, up to 500s.  She will return in 2 weeks for review of CBGs.  She also c/o HAs, but is not very certain about their quality/associations etc.  I suggested that she pay more attn to HAs.  She has h/o migraines as a child.  HAs have been persistent over the last week, during which time she has d/c coffee, so may be caffeine withdrawal.  Also may be related to hyperglycemia (less likely hypoglycemia given CBG readings, but encouraged her to check CBGs during HA).

## 2011-12-03 ENCOUNTER — Telehealth: Payer: Self-pay | Admitting: *Deleted

## 2011-12-03 DIAGNOSIS — E1165 Type 2 diabetes mellitus with hyperglycemia: Secondary | ICD-10-CM

## 2011-12-03 NOTE — Telephone Encounter (Signed)
Call from [facility].  Novolog Flepen rx needs directions, needs to include the sliding scale also qty of 1 vial is incorrect. Also, Lantus rx is either for vials or solostar; 15ml = 5 pens.  Please clarify   Thanks

## 2011-12-04 MED ORDER — INSULIN GLARGINE 100 UNIT/ML ~~LOC~~ SOLN: 90.0000 [IU] | Freq: Every day | SUBCUTANEOUS | Status: DC

## 2011-12-04 MED ORDER — INSULIN ASPART 100 UNIT/ML ~~LOC~~ SOLN: SUBCUTANEOUS | Status: DC

## 2011-12-12 ENCOUNTER — Ambulatory Visit (INDEPENDENT_AMBULATORY_CARE_PROVIDER_SITE_OTHER): Payer: Self-pay | Admitting: Internal Medicine

## 2011-12-12 ENCOUNTER — Encounter: Payer: Self-pay | Admitting: Internal Medicine

## 2011-12-12 VITALS — BP 137/87 | HR 74 | Temp 98.0°F | Wt 250.9 lb

## 2011-12-12 DIAGNOSIS — K0889 Other specified disorders of teeth and supporting structures: Secondary | ICD-10-CM

## 2011-12-12 DIAGNOSIS — K089 Disorder of teeth and supporting structures, unspecified: Secondary | ICD-10-CM

## 2011-12-12 DIAGNOSIS — Z79899 Other long term (current) drug therapy: Secondary | ICD-10-CM

## 2011-12-12 DIAGNOSIS — E118 Type 2 diabetes mellitus with unspecified complications: Secondary | ICD-10-CM

## 2011-12-12 DIAGNOSIS — E1165 Type 2 diabetes mellitus with hyperglycemia: Secondary | ICD-10-CM

## 2011-12-12 LAB — POCT GLYCOSYLATED HEMOGLOBIN (HGB A1C): Hemoglobin A1C: 13.6

## 2011-12-12 LAB — GLUCOSE, CAPILLARY: Glucose-Capillary: 306 mg/dL — ABNORMAL HIGH (ref 70–99)

## 2011-12-12 NOTE — Progress Notes (Signed)
  Subjective:    Patient ID: Michelle Brooks, female    DOB: [birth year], 46 y.o.   MRN: [id number]  HPI Comments: 46 y.o woman PMH Uncontrolled DM 2 13.6% HA1C and fsbs 306 today 12/12/11, HTN (137/87), h/o stroke, HLD, heavy menses, obesity (250 lbs), poor dentition. She presents for 2 week f/u for diabetes after Lantus was increased to 90 units qhs.  She also is taking Metformin but not as prescribed only 250 mg qd instead of bid b/c she states it causes nausea.  She is also not using meal time Novolog Tid and only using at lunch 4 units.  Her meter readings are high throughout the day particularly at night ranging 250s-530s.  She reports compliance with medications and revising her diet.  She has spoken to [facility] but does not want to speak with her today.  Associated sx's include fatigue and polyuria.    She reports left upper tooth is broken and she needs to see a dentist.    Diabetes She presents for her follow-up diabetic visit. She has type 2 diabetes mellitus. No MedicAlert identification noted. Her disease course has been worsening. There are no hypoglycemic associated symptoms. Associated symptoms include fatigue and polyuria. Symptoms are stable.  Dental Pain       Review of Systems  Constitutional: Positive for fatigue.  Genitourinary: Positive for polyuria.       Objective:   Physical Exam  Nursing note and vitals reviewed. Constitutional: She is oriented to person, place, and time. Vital signs are normal. She appears well-developed and well-nourished. She is cooperative. No distress.       Boyfriend in the room  HENT:  Head: Normocephalic and atraumatic.  Mouth/Throat: Oropharynx is clear and moist and mucous membranes are normal. Abnormal dentition. Dental caries present. No oropharyngeal exudate.    Eyes: Conjunctivae normal are normal. Pupils are equal, round, and reactive to light. Right eye exhibits no discharge. Left eye exhibits no discharge. No scleral icterus.    Cardiovascular: Normal rate, regular rhythm, S1 normal, S2 normal and normal heart sounds.   No murmur heard. Pulmonary/Chest: Effort normal and breath sounds normal. No respiratory distress. She has no wheezes.  Abdominal: Soft. Bowel sounds are normal. She exhibits no distension. There is no tenderness.       Obese ab  Neurological: She is alert and oriented to person, place, and time.       Gait normal   Skin: Skin is warm, dry and intact. No rash noted. She is not diaphoretic.  Psychiatric: She has a normal mood and affect. Her speech is normal and behavior is normal. Judgment and thought content normal. Cognition and memory are normal.          Assessment & Plan:  Follow up 2-4 weeks

## 2011-12-12 NOTE — Patient Instructions (Addendum)
Follow up in 2-4 weeks for your diabetes. Take your Novolog three times a day with meals    Dental Pain Toothache is pain in or around a tooth. It may get worse with chewing or with cold or heat.  HOME CARE Your dentist may use a numbing medicine during treatment. If so, you may need to avoid eating until the medicine wears off. Ask your dentist about this. Only take medicine as told by your dentist or doctor. Avoid chewing food near the painful tooth until after all treatment is done. Ask your dentist about this. GET HELP RIGHT AWAY IF:  The problem gets worse or new problems appear. You have a fever. There is redness and puffiness (swelling) of the face, jaw, or neck. You cannot open your mouth. There is pain in the jaw. There is very bad pain that is not helped by medicine. MAKE SURE YOU:  Understand these instructions. Will watch your condition. Will get help right away if you are not doing well or get worse. Document Released: 07/16/2007 Document Revised: 04/21/2011 Document Reviewed: 07/16/2007 [facility] Patient Information 2013 [place], [place].  Diabetes, Type 2 Diabetes is a long-lasting (chronic) disease. In type 2 diabetes, the pancreas does not make enough insulin (a hormone), and the body does not respond normally to the insulin that is made. This type of diabetes was also previously called adult-onset diabetes. It usually occurs after the age of 39, but it can occur at any age.  CAUSES  Type 2 diabetes happens because the pancreasis not making enough insulin or your body has trouble using the insulin that your pancreas does make properly. SYMPTOMS   Drinking more than usual.  Urinating more than usual.  Blurred vision.  Dry, itchy skin.  Frequent infections.  Feeling more tired than usual (fatigue). DIAGNOSIS The diagnosis of type 2 diabetes is usually made by one of the following tests:  Fasting blood glucose test. You will not eat for at least 8 hours and  then take a blood test.  Random blood glucose test. Your blood glucose (sugar) is checked at any time of the day regardless of when you ate.  Oral glucose tolerance test (OGTT). Your blood glucose is measured after you have not eaten (fasted) and then after you drink a glucose containing beverage. TREATMENT   Healthy eating.  Exercise.  Medicine, if needed.  Monitoring blood glucose.  Seeing your caregiver regularly. HOME CARE INSTRUCTIONS   Check your blood glucose at least once a day. More frequent monitoring may be necessary, depending on your medicines and on how well your diabetes is controlled. Your caregiver will advise you.  Take your medicine as directed by your caregiver.  Do not smoke.  Make wise food choices. Ask your caregiver for information. Weight loss can improve your diabetes.  Learn about low blood glucose (hypoglycemia) and how to treat it.  Get your eyes checked regularly.  Have a yearly physical exam. Have your blood pressure checked and your blood and urine tested.  Wear a pendant or bracelet saying that you have diabetes.  Check your feet every night for cuts, sores, blisters, and redness. Let your caregiver know if you have any problems. SEEK MEDICAL CARE IF:   You have problems keeping your blood glucose in target range.  You have problems with your medicines.  You have symptoms of an illness that do not improve after 24 hours.  You have a sore or wound that is not healing.  You notice a change  in vision or a new problem with your vision.  You have a fever. MAKE SURE YOU:  Understand these instructions.  Will watch your condition.  Will get help right away if you are not doing well or get worse. Document Released: 01/27/2005 Document Revised: 04/21/2011 Document Reviewed: 07/15/2010 [facility] Patient Information 2013 [place], [place].

## 2011-12-12 NOTE — Assessment & Plan Note (Signed)
HA1C 13.6 Advised patient to continue Lantus 90 units qhs, Recommend she use Novolog tid instead of qd, Continue Metformin though she is only taking 250 mg qd instead of bid  F/u 2-4 weeks for uncontrolled DM Given information about diabetes and diet Educated harm of uncontrolled diabetes She does not wish to speak with Gavin Pound today

## 2011-12-12 NOTE — Assessment & Plan Note (Signed)
Referred to dentist.

## 2011-12-29 ENCOUNTER — Ambulatory Visit (INDEPENDENT_AMBULATORY_CARE_PROVIDER_SITE_OTHER): Payer: Self-pay | Admitting: Internal Medicine

## 2011-12-29 ENCOUNTER — Encounter: Payer: Self-pay | Admitting: Internal Medicine

## 2011-12-29 VITALS — BP 127/78 | HR 81 | Temp 97.9°F | Wt 250.0 lb

## 2011-12-29 DIAGNOSIS — E119 Type 2 diabetes mellitus without complications: Secondary | ICD-10-CM

## 2011-12-29 DIAGNOSIS — G8929 Other chronic pain: Secondary | ICD-10-CM | POA: Insufficient documentation

## 2011-12-29 DIAGNOSIS — M545 Low back pain: Secondary | ICD-10-CM

## 2011-12-29 DIAGNOSIS — E1165 Type 2 diabetes mellitus with hyperglycemia: Secondary | ICD-10-CM

## 2011-12-29 DIAGNOSIS — I10 Essential (primary) hypertension: Secondary | ICD-10-CM

## 2011-12-29 DIAGNOSIS — E669 Obesity, unspecified: Secondary | ICD-10-CM

## 2011-12-29 DIAGNOSIS — E785 Hyperlipidemia, unspecified: Secondary | ICD-10-CM

## 2011-12-29 LAB — GLUCOSE, CAPILLARY

## 2011-12-29 MED ORDER — METFORMIN HCL 500 MG PO TABS: 250.0000 mg | ORAL_TABLET | Freq: Every day | ORAL | Status: DC

## 2011-12-29 NOTE — Progress Notes (Signed)
I saw patient and discussed her care with resident Dr. Sherrine Maples.  I agree with the clinical findings and plans as outlined in her note.

## 2011-12-29 NOTE — Assessment & Plan Note (Addendum)
Discussed exercise most days 15-30 min and gradually increasing with wt loss goal 2-4 lbs qweek Goal wt for ht of 5'8" should be 160 lbs and the patient is 250 lbs

## 2011-12-29 NOTE — Assessment & Plan Note (Signed)
Prob mechanical/musculoskeletal and may be related to obesity Advised to try heat and Tylenol or NSAIDS Address in the future if continues to be problematic.

## 2011-12-29 NOTE — Assessment & Plan Note (Addendum)
HA1C 13.6% on 12/12/11 and fsbs 365 today (pt is nonfasting and had Mindi Slicker prior to this OV) Patient is taking Lantus 90 units qhs and states she does not miss doses, Novolog 6 units with breakfast and lunch only not tid (also she is not following the sliding scale currently), and Metformin 250 mg qd (initially Rx bid but patient feeling nauseated and ab cramps with bid dose).  DM is still uncontrolled meter reading only showing breakfast and lunch fsbs readings ranging 250s-370s since last OV 12/12/11  Plan F/u in 1-2 weeks for DM at this time adjust doses as needed  At that time patient is supposed to bring meter and bring log of when she is checking her fsbs and how much Novolog or Lantus or Metformin she is taking. Goal is to get her checking fsbs tid at least for a week She will also see Gavin Pound within the next 1-2 weeks

## 2011-12-29 NOTE — Progress Notes (Signed)
  Subjective:    Patient ID: Michelle Brooks, female    DOB: 1965-10-13, 46 y.o.   MRN: [id number]  HPI Comments: Patient presents for follow up for uncontrolled DM (fsbs 365 today HA1C 13.6 on 12/12/11.) PMH includes HTN, h/o stroke (see media section), HLD, heavy menses, obesity, poor dentition.  She is taking Lantus 90 units qhs and Metformin 250 mg qd.  She is also on Novolog 6 units at breakfast and at lunch.  She denies missing doses of Lantus.  She reports feeling nauseated and having ab cramps with Metformin.  She checks her fsbs before meals.  She reports fatigue, increased sleep, increased thirst (which is not new).  She denies polyuria. She reports eating peanuts, peanut butter, apples (but not daily).  She does eat a lot of peanut butter to fill her up at times.  She denies hypoglycemic symptoms  She complains of lower back pain x years worse with menstrual cramps and standing > 1 hour.  It feels like she has done heavy lifting all day.  She denies radiation of pain to legs and she reports 6-7/10 back pain.  She will take an Aspirin for relief.   SH: boyfriend with pt on exam, 3 kids, 2 grandchildren.  She has the orange card. She is from [place].       Review of Systems  Constitutional: Negative for appetite change.  HENT: Positive for dental problem.   Respiratory: Negative for shortness of breath.   Cardiovascular: Negative for chest pain.  Gastrointestinal: Negative for abdominal pain.  Musculoskeletal: Positive for back pain.  Skin: Negative for wound.  Psychiatric/Behavioral:       Increased sleep       Objective:   Physical Exam  Nursing note and vitals reviewed. Constitutional: She is oriented to person, place, and time. Vital signs are normal. She appears well-developed and well-nourished. She is cooperative. No distress.       Pleasant   HENT:  Head: Normocephalic and atraumatic.  Mouth/Throat: Oropharynx is clear and moist and mucous membranes are normal. No  oropharyngeal exudate.  Eyes: Conjunctivae normal are normal. Pupils are equal, round, and reactive to light. Right eye exhibits no discharge. Left eye exhibits no discharge. No scleral icterus.  Cardiovascular: Normal rate, regular rhythm, S1 normal, S2 normal and normal heart sounds.   No murmur heard.      1+ edema b/l lower extremities   Pulmonary/Chest: Effort normal and breath sounds normal. No respiratory distress. She has no wheezes.  Abdominal: Soft. Bowel sounds are normal. She exhibits no distension. There is no tenderness.       Obese ab  Neurological: She is alert and oriented to person, place, and time. Gait normal.  Skin: Skin is warm, dry and intact. No rash noted. She is not diaphoretic.       No wounds to feet b/l   Psychiatric: She has a normal mood and affect. Her speech is normal and behavior is normal. Judgment and thought content normal. Cognition and memory are normal.          Assessment & Plan:  Follow up 1-2 weeks DM and pt also to see Lupita Leash within that time

## 2011-12-29 NOTE — Assessment & Plan Note (Signed)
Lipid Panel     Component Value Date/Time   CHOL 151 05/12/2011 1110   TRIG 86 05/12/2011 1110   HDL 34* 05/12/2011 1110   CHOLHDL 4.4 05/12/2011 1110   VLDL 17 05/12/2011 1110   LDLCALC 100* 05/12/2011 1110   LDL at goal

## 2011-12-29 NOTE — Assessment & Plan Note (Signed)
Controlled BP 127/78 today

## 2011-12-29 NOTE — Patient Instructions (Addendum)
Please record your blood glucose levels for 1 week three times a day and log with your hour and dose of diabetic medications RV 1-2 weeks for follow up with Shirlee Latch and at this time we can adjust your medication if needed.  Please make an appt with Gavin Pound for 1 week or the next day that your are in clinic with me Thanks   Type 2 Diabetes Diabetes is a long-lasting (chronic) disease. One or both of the following happen with type 2 diabetes:   The pancreas does not make enough of a hormone called insulin.  The body has trouble using the insulin that is made. HOME CARE  Check your blood sugar (glucose) once a day, or as told by your doctor.  Take all medicine as told by your doctor.  Do not smoke.  Eat healthy foods. Weight loss can help your diabetes.  Learn about low blood sugar (hypoglycemia). Know how to treat it.  Get your eyes checked on a regular basis.  Get a physical exam every year. Get your blood pressure checked. Get your blood and pee (urine) tested.  Wear a necklace or bracelet that says you have diabetes.  Check your feet every night for cuts, sores, blisters, and redness. Tell your doctor if you have problems. GET HELP RIGHT AWAY IF:  You have trouble keeping your blood sugar in target range.  You have problems with your medicines.  You are sick and not getting better after 24 hours.  You have a sore or wound that is not healing.  You have vision problems or changes.  You have a fever. MAKE SURE YOU:  Understand these instructions.  Will watch your condition.  Will get help right away if you are not doing well or get worse. Document Released: 11/06/2007 Document Revised: 04/21/2011 Document Reviewed: 07/15/2010 [facility] Patient Information 2013 [place], [place].

## 2012-01-12 ENCOUNTER — Encounter: Payer: Self-pay | Admitting: Internal Medicine

## 2012-01-12 ENCOUNTER — Other Ambulatory Visit: Payer: Self-pay | Admitting: Internal Medicine

## 2012-01-12 ENCOUNTER — Ambulatory Visit (INDEPENDENT_AMBULATORY_CARE_PROVIDER_SITE_OTHER): Payer: Self-pay | Admitting: Internal Medicine

## 2012-01-12 VITALS — BP 131/83 | HR 82 | Temp 96.6°F | Ht 68.0 in | Wt 245.9 lb

## 2012-01-12 DIAGNOSIS — H01009 Unspecified blepharitis unspecified eye, unspecified eyelid: Secondary | ICD-10-CM

## 2012-01-12 DIAGNOSIS — E118 Type 2 diabetes mellitus with unspecified complications: Secondary | ICD-10-CM

## 2012-01-12 DIAGNOSIS — H01006 Unspecified blepharitis left eye, unspecified eyelid: Secondary | ICD-10-CM

## 2012-01-12 DIAGNOSIS — E1165 Type 2 diabetes mellitus with hyperglycemia: Secondary | ICD-10-CM

## 2012-01-12 DIAGNOSIS — H01005 Unspecified blepharitis left lower eyelid: Secondary | ICD-10-CM | POA: Insufficient documentation

## 2012-01-12 LAB — GLUCOSE, CAPILLARY: Glucose-Capillary: 224 mg/dL — ABNORMAL HIGH (ref 70–99)

## 2012-01-12 MED ORDER — AZITHROMYCIN 1 % OP SOLN: 1.0000 [drp] | Freq: Two times a day (BID) | OPHTHALMIC | Status: DC

## 2012-01-12 NOTE — Progress Notes (Signed)
Subjective:    Patient: Michelle Brooks   Age/ Gender: 46 y.o., female   MRN: [id number]  DOB: [birth year]     HPI: [name] is a 46 y.o. with a PMHx of uncontrolled DM2, Hypertension, prior stroke, who presented to clinic today for the following:  1) DM2 - A1c 13.6 in 12/2011 - Patient checking blood sugars 1 times daily, before breakfast. Reports fasting blood sugars of 160-300 mg/dL. Currently taking Lantus 90 units qHS, Metformin 500mg , Novolog 3 units TID (no longer taking the sliding scale). Patient misses doses 0 x per week on average.  0 hypoglycemic episodes since last visit. Denies assisted hypoglycemia or recently hospitalizations for either hyper or hypoglycemia. Denies polyuria, polydipsia, nausea, vomiting, diarrhea.  does not request refills today. States she is feeling much better than previously with blood sugars out of control.  In regards to diabetic complications:  Microvascular complications: Confirms: none ; Denies nephropa thy, retinopathy and peripheral neuropathy.  Macrovascular complications: Confirms: cerebrovascular disease ; Denies cardiovascular disease and peripheral vascular disease.   Important diabetic medications: Is patient on aspirin? Yes  Is patient on a statin? Yes Is patient on an ACE-I? Yes  2) HTN - Patient does not check blood pressure regularly at home. Currently taking lisinopril-HCTZ. Patient misses doses 0 x per week on average. denies headaches, dizziness, lightheadedness, chest pain, shortness of breath.  does not request refills today.  3) Itching eye - patient indicates 1 week history of left > right itchiness of her eyes, with small stye forming in left medial lid, with morning crusting. No fevers, chills. No vision loss, blurriness of vision.    Review of Systems: Per HPI.    Current Outpatient Medications: Medication Sig  . aspirin EC 325 MG tablet Take 1 tablet (325 mg total) by mouth daily.  . insulin glargine (LANTUS) 100  UNIT/ML injection Inject 90 Units into the skin at bedtime. Dispense 2 boxes - needs 8 pens per month.  . Insulin Syringe-Needle U-100 (INSULIN SYRINGE .5CC/31GX5/16") 31G X 5/16" 0.5 ML MISC Inject insulin before lunch time.  [name] lisinopril-hydrochlorothiazide (PRINZIDE) 10-12.5 MG per tablet Take 1 tablet by mouth daily.  . metFORMIN (GLUCOPHAGE) 500 MG tablet Take 500 mg by mouth daily with breakfast.  . Multiple Vitamins-Minerals (MULTIVITAMIN WITH MINERALS) tablet Take 1 tablet by mouth daily.  . pravastatin (PRAVACHOL) 20 MG tablet Take 1 tablet (20 mg total) by mouth every evening.  . [DISCONTINUED] metFORMIN (GLUCOPHAGE) 500 MG tablet Take 0.5 tablets (250 mg total) by mouth daily with breakfast.  . insulin aspart (NOVOLOG FLEXPEN) 100 UNIT/ML injection CBG 150-180 - inject 1 unit, CBG 181-210 - inject 2 units, CBG 211-240 - inject 3 units, CBG 241-270 - inject 4 units, CBG 271-300 - inject 5 units, CBG 301-330 - inject 6 units, CBG 331-360- inject 7 units, CBG 361-390 - inject 8 units, CBG > 400 - call clinic.    Allergies: No Known Allergies   Past Medical History  Diagnosis Date  . Diabetes mellitus type 2 in obese   . Hypertension   . 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  [name]  Hyperlipidemia LDL goal < 100 04/01/2011    Past Surgical History  Procedure Date  . Breast lumpectomy 2002    states benign lymph node removed.     Objective:    Physical Exam: Filed Vitals:   01/12/12 1405  BP: 131/83  Pulse: 82  Temp:       General: Vital signs reviewed and noted. Well-developed,  well-nourished, in no acute distress; alert, appropriate and cooperative throughout examination.  Head: Normocephalic, atraumatic.  Eyes: Left conjunctiva with mild injection. Left lower medial lid margin with small raised, mildly erythematous bump. PERRL.  Ears: Significant cerumen buildup, soft without indication of impaction.  Nose: Mucous membranes moist, not inflammed, nonerythematous.  Throat: Oropharynx nonerythematous, no exudate appreciated. Poor dentition.   Neck: No deformities, masses, or tenderness noted.  Lungs:  Normal respiratory effort. Clear to auscultation BL without crackles or wheezes.  Heart: RRR. S1 and S2 normal without gallop, rubs. (+) murmur.  Abdomen:  BS normoactive. Soft, Nondistended, non-tender.  No masses or organomegaly.  Extremities: No pretibial edema.  Neurologic: A&O X3, CN II - XII are grossly intact. Motor strength is 5/5 in the all 4 extremities, Sensations intact to light touch.      Assessment/ Plan:   Case and plan of care discussed with attending physician, Dr. Jonah Blue.

## 2012-01-12 NOTE — Telephone Encounter (Signed)
Please phone in pravastatin to health dept.  Thank you! - Johnette Abraham, D.O., 01/12/2012, 5:49 PM

## 2012-01-12 NOTE — Patient Instructions (Addendum)
General Instructions:  Please follow-up at the clinic in 2 weeks, at which time we will reevaluate your diabetes and itching eyes - OR, please follow-up in the clinic sooner if needed.  There have been changes in your medications:  START azithromycin topical eye solution twice a day for 2 weeks.  Start taking your Novolog per your sliding scale.    Start checking your blood sugars at least 3 times daily before each meal.  If you have been started on new medication(s), and you develop symptoms concerning for allergic reaction, including, but not limited to, throat closing, tongue swelling, rash, please stop the medication immediately and call the clinic at [phone], and go to the ER.  If you are diabetic, please bring your meter to your next visit.  If symptoms worsen, or new symptoms arise, please call the clinic or go to the ER.  Please bring all of your medications in a bag to your next visit.    Treatment Goals:  Goals (1 Years of Data) as of 01/12/2012          As of Today As of Today 12/29/11 12/12/11 11/28/11     Blood Pressure    . Blood Pressure < 140/90  131/83 173/102 127/78 137/87 132/78     Result Component    . HEMOGLOBIN A1C < 7.0     13.6     . LDL CALC < 100            Progress Toward Treatment Goals:  Treatment Goal 01/12/2012  Hemoglobin A1C deteriorated  Blood pressure unchanged    Self Care Goals & Plans:       Care Management & Community Referrals:  Referral 01/12/2012  Referrals made for care management support diabetes educator

## 2012-01-12 NOTE — Assessment & Plan Note (Signed)
Pertinent Labs: Hemoglobin A1C  Date Value Range Status  12/12/2011 13.6   Final  09/11/2011 12.4   Final  03/27/2011 6.9   Final    Assessment: Diabetes control: poor control (HgbA1C >9%) Progress toward A1C goal:  deteriorated Comments: Patient is not taking her Novolog as instructed. She is not following her sliding scale. Instead, she has started using scheduled 3 units of Novolog TID (without checking her blood sugars during the day). She does not like or want to check her blood sugars, which is her main limitation. Dietary indiscretions also continue to be an issue. She has made efforts towards weight loss, which is great.   Plan:      Medications:  continue current medications. However, she instructed to start taking her Novolog has recommended (per sliding scale). We will use this information to help determine if she needs scheduled meal time insulin and how much is needed.  Home glucose monitoring:  She MUST increase the frequency of BG checks - advised to check at least 3 times a day before each meal  Instruction/counseling given: reminded to bring blood glucose meter & log to each visit and discussed diet  Educational resources provided:  Self management tools provided: instructions for home glucose monitoring;copy of home glucose meter download  She is advised to come back to clinic in 2 weeks for reevaluation of her diabetes.

## 2012-01-12 NOTE — Assessment & Plan Note (Signed)
Assessment: External exam most consistent with blepharitis. Will therefore start therapy.   Plan:      Start Azithromycin ophthalmic solution.  Advised of eye hygiene and to use warm compresses for comfort, continued pain.  Handout provided regarding diagnosis of blepharitis, and home supportive therapies.

## 2012-01-13 NOTE — Telephone Encounter (Signed)
Rx called in to health dept 

## 2012-01-26 ENCOUNTER — Ambulatory Visit: Payer: Self-pay | Admitting: Internal Medicine

## 2012-03-15 ENCOUNTER — Other Ambulatory Visit: Payer: Self-pay | Admitting: *Deleted

## 2012-03-15 DIAGNOSIS — I10 Essential (primary) hypertension: Secondary | ICD-10-CM

## 2012-03-15 DIAGNOSIS — E1169 Type 2 diabetes mellitus with other specified complication: Secondary | ICD-10-CM

## 2012-03-15 MED ORDER — LISINOPRIL-HYDROCHLOROTHIAZIDE 10-12.5 MG PO TABS: 1.0000 | ORAL_TABLET | Freq: Every day | ORAL | Status: DC

## 2012-03-15 NOTE — Telephone Encounter (Signed)
Rx faxed in.

## 2012-03-25 ENCOUNTER — Encounter: Payer: Self-pay | Admitting: Internal Medicine

## 2012-04-01 ENCOUNTER — Ambulatory Visit: Payer: Self-pay

## 2012-04-13 ENCOUNTER — Other Ambulatory Visit: Payer: Self-pay | Admitting: *Deleted

## 2012-04-13 MED ORDER — METFORMIN HCL 500 MG PO TABS: 500.0000 mg | ORAL_TABLET | Freq: Every day | ORAL | Status: DC

## 2012-05-13 ENCOUNTER — Encounter: Payer: Self-pay | Admitting: Internal Medicine

## 2012-05-13 ENCOUNTER — Ambulatory Visit (INDEPENDENT_AMBULATORY_CARE_PROVIDER_SITE_OTHER): Payer: Medicaid Other | Admitting: Internal Medicine

## 2012-05-13 VITALS — BP 119/75 | HR 79 | Temp 97.2°F | Ht 68.0 in | Wt 250.1 lb

## 2012-05-13 DIAGNOSIS — E785 Hyperlipidemia, unspecified: Secondary | ICD-10-CM

## 2012-05-13 DIAGNOSIS — Z79899 Other long term (current) drug therapy: Secondary | ICD-10-CM

## 2012-05-13 DIAGNOSIS — Z7251 High risk heterosexual behavior: Secondary | ICD-10-CM | POA: Insufficient documentation

## 2012-05-13 DIAGNOSIS — E118 Type 2 diabetes mellitus with unspecified complications: Secondary | ICD-10-CM

## 2012-05-13 DIAGNOSIS — E1165 Type 2 diabetes mellitus with hyperglycemia: Secondary | ICD-10-CM

## 2012-05-13 DIAGNOSIS — I10 Essential (primary) hypertension: Secondary | ICD-10-CM

## 2012-05-13 LAB — MICROALBUMIN / CREATININE URINE RATIO
Creatinine, Urine: 77.2 mg/dL
Microalb, Ur: 0.51 mg/dL (ref 0.00–1.89)

## 2012-05-13 LAB — LIPID PANEL
Cholesterol: 189 mg/dL (ref 0–200)
LDL Cholesterol: 113 mg/dL — ABNORMAL HIGH (ref 0–99)
Total CHOL/HDL Ratio: 6.3 Ratio

## 2012-05-13 LAB — GLUCOSE, CAPILLARY: Glucose-Capillary: 405 mg/dL — ABNORMAL HIGH (ref 70–99)

## 2012-05-13 MED ORDER — INSULIN GLARGINE 100 UNIT/ML ~~LOC~~ SOLN: 45.0000 [IU] | Freq: Two times a day (BID) | SUBCUTANEOUS | Status: DC

## 2012-05-13 MED ORDER — INSULIN GLARGINE 100 UNIT/ML ~~LOC~~ SOLN: 95.0000 [IU] | Freq: Every day | SUBCUTANEOUS | Status: DC

## 2012-05-13 MED ORDER — INSULIN ASPART 100 UNIT/ML ~~LOC~~ SOLN: SUBCUTANEOUS | Status: DC

## 2012-05-13 NOTE — Patient Instructions (Addendum)
General Instructions:  Please follow-up at the clinic in 2 months, at which time we will reevaluate your diabetes, blood pressure - OR, please follow-up in the clinic sooner if needed.  There have been changes in your medications:  Split your Lantus to 45 units twice a day  START using the sliding scale insulin   See the endocrinologist as soon as possible for your diabetes.  See Lupita Leash Plyler for your diabetes diet.  Start checking your blood sugars before each meal.  Call me to let me know what kind of meter/ strips you want  If you have been started on new medication(s), and you develop symptoms concerning for allergic reaction, including, but not limited to, throat closing, tongue swelling, rash, please stop the medication immediately and call the clinic at [phone], and go to the ER.  If you are diabetic, please bring your meter to your next visit.  If symptoms worsen, or new symptoms arise, please call the clinic or go to the ER.  PLEASE BRING ALL OF YOUR MEDICATIONS  IN A BAG TO YOUR NEXT APPOINTMENT   Treatment Goals:  Goals (1 Years of Data) as of 05/13/12         As of Today 01/12/12 01/12/12 12/29/11 12/12/11     Blood Pressure    . Blood Pressure < 140/90  119/75 131/83 173/102 127/78 137/87     Result Component    . HEMOGLOBIN A1C < 7.0  13.2    13.6    . LDL CALC < 100            Progress Toward Treatment Goals:  Treatment Goal 01/12/2012  Hemoglobin A1C deteriorated  Blood pressure unchanged    Self Care Goals & Plans:  Self Care Goal 05/13/2012  Manage my medications take my medicines as prescribed; refill my medications on time  Monitor my health keep track of my blood glucose; bring my glucose meter and log to each visit  Eat healthy foods -    Home Blood Glucose Monitoring 01/12/2012  Check my blood sugar 3 times a day  When to check my blood sugar before breakfast; before dinner; before lunch     Care Management & Community  Referrals:  Referral 01/12/2012  Referrals made for care management support diabetes educator

## 2012-05-13 NOTE — Assessment & Plan Note (Signed)
Pertinent Data: BP Readings from Last 3 Encounters:  05/13/12 119/75  01/12/12 131/83  12/29/11 127/78    Basic Metabolic Panel:    Component Value Date/Time   NA 135 10/23/2011 1614   K 4.1 10/23/2011 1614   CL 96 10/23/2011 1614   CO2 30 10/23/2011 1614   BUN 17 10/23/2011 1614   CREATININE 1.09 10/23/2011 1614   CREATININE 0.92 07/03/2011 0903   GLUCOSE 478* 10/23/2011 1614   CALCIUM 10.3 10/23/2011 1614    Assessment: Disease Control:  Controlled  Progress toward goals:  At goal  Barriers to meeting goals: no barriers identified    Patient is compliant most of the time with prescribed medications.   Plan:  continue current medications  Educational resources provided:    Self management tools provided:

## 2012-05-13 NOTE — Assessment & Plan Note (Addendum)
Pertinent Labs: Lab Results  Component Value Date   HGBA1C 13.2 05/13/2012   CREATININE 1.09 10/23/2011   CREATININE 0.92 07/03/2011   MICROALBUR 1.95* 03/27/2011   MICRALBCREAT 11.0 03/27/2011   CHOL 151 05/12/2011   HDL 34* 05/12/2011   TRIG 86 05/12/2011    Assessment: Disease Control:   uncontrolled   Progress toward goals:   deteriorated  Barriers to meeting goals: Barriers are unclear, the patient states that she is taking all of her medications, although I'm not certain of this.  Microvascular complications: none  Macrovascular complications: cerebrovascular disease  On aspirin: Yes    On statin: Yes  On ACE-I/ ARB: Yes    Patient is compliant most of the time with prescribed medications.   Plan: Glucometer log was reviewed today, as pt did have glucometer available for review - continues with fasting hyperglycemia with blood sugars in the high 200s to 300s.   I initially preferred to increase her Lantus further to 95 units.  Patient does not want escalate her insulin further at this time, instead we are going to attempt to split insulin with 45 units in the morning and 45 units in the evening.  Will recommend for her to resume her sliding scale insulin with the above parameters, with the plan that likely she will end up needing scheduled mealtime insulin however the sliding-scale will help [place] to assess how much she will require.  Instructed to followup with Lupita Leash for continued dietary education.  Will refer to endocrinology for continued management.  Check A1c, lipid panel, urine microalb/ cr  reminded to bring blood glucose meter & log to each visit  Educational resources provided: brochure  Self management tools provided: copy of home glucose meter download  Home glucose monitoring recommendation:     check blood sugars at least 3 times daily before meals

## 2012-05-13 NOTE — Progress Notes (Signed)
Patient: Michelle Brooks   MRN: [id number]  DOB: 21-Dec-1965  PCP: Michelle Mann, DO   Subjective:    HPI: [name] is a 47 y.o. female with a PMHx as outlined below, who presented to clinic today for the following:  1) DM2 -  Lab Results  Component Value Date   HGBA1C 13.6 12/12/2011   Patient checking blood sugars 1 times daily, before breakfast. Reports fasting blood sugars of 270-300 mg/dL. Currently taking Lantus 90 units qHS, Metformin 500mg , Novolog 6 units TID (still not taking the sliding scale). Patient misses doses 0 x per week on average.  0 hypoglycemic episodes since last visit. Denies assisted hypoglycemia or recently hospitalizations for either hyper or hypoglycemia. Denies polyuria, polydipsia, nausea, vomiting, diarrhea. Requests refills of supplies today, although she wants to get a new meter, therefore, doesn't know what kind of supplies she wants.  Admits that her diet is not optimal with still high carbohydrate content.  In regards to diabetic complications:  Microvascular complications: Confirms: none ; Denies nephropathy, retinopathy and peripheral neuropathy.  Macrovascular complications: Confirms: cerebrovascular disease ; Denies cardiovascular disease and peripheral vascular disease.   Important diabetic medications: Is patient on aspirin? Yes  Is patient on a statin? Yes Is patient on an ACE-I? Yes  2) HTN - Patient does not check blood pressure regularly at home. Currently taking lisinopril-HCTZ. Patient misses doses 0 x per week on average. denies headaches, dizziness, lightheadedness, chest pain, shortness of breath.  does not request refills today.  3) High risk sexual behavior - patient indicates that over a year ago, she had sexual contact with a person that recently passed away. There has been recent suspicion that her sexual partner may have had HIV. Therefore, the patient requests to be tested today. Denies unintentional weight loss,  lymphadenopathy, generalized malaise.   Review of Systems: Per HPI.   Current Outpatient Medications: Medication Sig  . aspirin EC 325 MG tablet Take 1 tablet (325 mg total) by mouth daily.  . insulin glargine (LANTUS) 100 UNIT/ML injection Inject 90 Units into the skin at bedtime. Dispense 2 boxes - needs 8 pens per month.  . lisinopril-hydrochlorothiazide (PRINZIDE) 10-12.5 MG per tablet Take 1 tablet by mouth daily.  . metFORMIN (GLUCOPHAGE) 500 MG tablet Take 1 tablet (500 mg total) by mouth daily with breakfast.  . Multiple Vitamins-Minerals (MULTIVITAMIN WITH MINERALS) tablet Take 1 tablet by mouth daily.  . pravastatin (PRAVACHOL) 20 MG tablet TAKE ONE (1) TABLET EACH DAY IN THE EVENING  . insulin aspart (NOVOLOG FLEXPEN) 100 UNIT/ML injection Taking 6 units TID although SSI recommended    Allergies No Known Allergies   Past Medical History  Diagnosis Date  . Diabetes mellitus type 2 in obese   . Hypertension   . History of stroke 03/2010    Right MCA stroke in 03/2010, with MRI also noting subacute strokes in left fronto parietal area - occured in [place] received care at [facility] thought due to uncontrolled blood pressure // No residual deficits // TTE (03/2010) at [facility] - normal LV systolic function, moderate pericardial effusion with 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  [name] Hyperlipidemia LDL goal < 100 04/01/2011    Past Surgical History  Procedure Laterality Date  . Breast lumpectomy  2002    states benign lymph node removed.     Objective:    Physical Exam: Filed Vitals:   05/13/12 1514  BP: 119/75  Pulse: 79  Temp: 97.2 F (36.2 C)     General: Vital signs reviewed and noted. Well-developed, well-nourished, in no acute distress; alert, appropriate and cooperative throughout examination.  Head: Normocephalic, atraumatic.  Throat: Oropharynx nonerythematous, no exudate appreciated. Poor  dentition.   Lungs:  Normal respiratory effort. Clear to auscultation BL without crackles or wheezes.  Heart: RRR. S1 and S2 normal without gallop, rubs. (+) murmur.  Abdomen:  BS normoactive. Soft, Nondistended, non-tender.  No masses or organomegaly.  Extremities: No pretibial edema.    Assessment/ Plan:   The patient's case and plan of care was discussed with attending physician, Dr. Doneen Poisson.

## 2012-05-13 NOTE — Assessment & Plan Note (Signed)
Assessment: Indicates that she has possibly had sexual interaction with partner that may have had HIV. No acute indication of infection.  Plan:      Will check HIV Ab today.

## 2012-05-13 NOTE — Assessment & Plan Note (Signed)
Pertinent Labs: Liver Function Tests:    Component Value Date/Time   AST 11 05/12/2011 1110   ALT 11 05/12/2011 1110   ALKPHOS 69 05/12/2011 1110   BILITOT 0.4 05/12/2011 1110   PROT 7.1 05/12/2011 1110   ALBUMIN 4.2 05/12/2011 1110    Lipid Panel:     Component Value Date/Time   CHOL 151 05/12/2011 1110   TRIG 86 05/12/2011 1110   HDL 34* 05/12/2011 1110   CHOLHDL 4.4 05/12/2011 1110   VLDL 17 05/12/2011 1110   LDLCALC 100* 05/12/2011 1110     Assessment: Goal LDL (per ATP guidelines): < 100 mg/dL  Disease Control: controlled  Progress toward goals: at goal  Barriers to meeting goals: no barriers identified    Patient is not fasting today.   Patient is compliant most of the time with prescribed medications.    Plan:  continue current medications  check lipid panel today

## 2012-05-14 MED ORDER — PRAVASTATIN SODIUM 40 MG PO TABS: 40.0000 mg | ORAL_TABLET | Freq: Every day | ORAL | Status: DC

## 2012-05-14 NOTE — Progress Notes (Signed)
Quick Note:  LDL not at goal, will escalate pravastatin to 40mg  daily. HIV Ab negative. ______

## 2012-05-14 NOTE — Addendum Note (Signed)
Addended by: Priscella Mann on: 05/14/2012 09:40 AM   Modules accepted: Orders

## 2012-05-17 NOTE — Progress Notes (Signed)
Case discussed with Dr. Kalia-Reynolds at time of visit. We reviewed the resident's history and exam and pertinent patient test results. I agree with the assessment, diagnosis, and plan of care documented in the resident's note.  

## 2012-05-18 ENCOUNTER — Telehealth: Payer: Self-pay | Admitting: *Deleted

## 2012-05-18 MED ORDER — INSULIN GLARGINE 100 UNIT/ML ~~LOC~~ SOLN: 45.0000 [IU] | Freq: Every day | SUBCUTANEOUS | Status: DC

## 2012-05-18 NOTE — Telephone Encounter (Signed)
Changed to [employer]. Please apologize for my mistake.  Thank you! - Johnette Abraham, D.O., 05/18/2012, 12:51 PM

## 2012-05-18 NOTE — Telephone Encounter (Signed)
Pt left message that she uses Lantus solostar pen. Does not have syringes for lantus vials. Would like to continue with solostar. Uses [facility] / [employer]. Stanton Kidney Bowie Delia RN 05/18/12 10:45AM

## 2012-05-24 ENCOUNTER — Encounter: Payer: Self-pay | Admitting: Internal Medicine

## 2012-06-08 ENCOUNTER — Other Ambulatory Visit (HOSPITAL_COMMUNITY): Payer: Self-pay | Admitting: Internal Medicine

## 2012-06-08 DIAGNOSIS — Z1231 Encounter for screening mammogram for malignant neoplasm of breast: Secondary | ICD-10-CM

## 2012-06-09 ENCOUNTER — Other Ambulatory Visit: Payer: Self-pay | Admitting: *Deleted

## 2012-06-09 MED ORDER — GLUCOSE BLOOD VI STRP: ORAL_STRIP | Status: DC

## 2012-06-09 NOTE — Telephone Encounter (Signed)
Pt states she uses Presto test strips.

## 2012-06-22 ENCOUNTER — Encounter: Payer: Medicaid Other | Admitting: Dietician

## 2012-06-24 ENCOUNTER — Other Ambulatory Visit: Payer: Self-pay | Admitting: Internal Medicine

## 2012-06-24 MED ORDER — ACCU-CHEK AVIVA PLUS W/DEVICE KIT: 1.0000 | PACK | Freq: Three times a day (TID) | Status: AC

## 2012-06-24 MED ORDER — GLUCOSE BLOOD VI STRP: ORAL_STRIP | Status: DC

## 2012-07-01 ENCOUNTER — Ambulatory Visit (HOSPITAL_COMMUNITY)
Admission: RE | Admit: 2012-07-01 | Discharge: 2012-07-01 | Disposition: A | Discharge status: Home / Self Care | Payer: Medicaid Other | Source: Ambulatory Visit | Attending: Internal Medicine | Admitting: Internal Medicine

## 2012-07-01 DIAGNOSIS — Z1231 Encounter for screening mammogram for malignant neoplasm of breast: Secondary | ICD-10-CM | POA: Insufficient documentation

## 2012-07-15 ENCOUNTER — Encounter: Payer: Medicaid Other | Admitting: Internal Medicine

## 2012-07-15 NOTE — Addendum Note (Signed)
Addended by: Neomia Dear on: 07/15/2012 05:07 PM   Modules accepted: Orders

## 2012-07-20 ENCOUNTER — Encounter: Payer: Self-pay | Admitting: Dietician

## 2012-08-19 ENCOUNTER — Other Ambulatory Visit: Payer: Self-pay

## 2012-09-22 ENCOUNTER — Other Ambulatory Visit: Payer: Self-pay | Admitting: Internal Medicine

## 2012-10-25 ENCOUNTER — Other Ambulatory Visit: Payer: Self-pay | Admitting: *Deleted

## 2012-10-25 DIAGNOSIS — E1169 Type 2 diabetes mellitus with other specified complication: Secondary | ICD-10-CM

## 2012-10-25 DIAGNOSIS — I10 Essential (primary) hypertension: Secondary | ICD-10-CM

## 2012-10-27 MED ORDER — LISINOPRIL-HYDROCHLOROTHIAZIDE 10-12.5 MG PO TABS: 1.0000 | ORAL_TABLET | Freq: Every day | ORAL | Status: DC

## 2012-10-28 NOTE — Telephone Encounter (Signed)
Flag sent to front desk pool regarding appt. 

## 2012-12-03 ENCOUNTER — Encounter: Payer: Self-pay | Admitting: Family Medicine

## 2012-12-03 ENCOUNTER — Ambulatory Visit (INDEPENDENT_AMBULATORY_CARE_PROVIDER_SITE_OTHER): Payer: Medicaid Other | Admitting: Family Medicine

## 2012-12-03 ENCOUNTER — Other Ambulatory Visit (HOSPITAL_COMMUNITY)
Admission: RE | Admit: 2012-12-03 | Discharge: 2012-12-03 | Disposition: A | Discharge status: Home / Self Care | Payer: Medicaid Other | Source: Ambulatory Visit | Attending: Obstetrics and Gynecology | Admitting: Obstetrics and Gynecology

## 2012-12-03 VITALS — BP 147/89 | HR 83 | Temp 97.1°F | Ht 66.0 in | Wt 252.8 lb

## 2012-12-03 DIAGNOSIS — Z01419 Encounter for gynecological examination (general) (routine) without abnormal findings: Secondary | ICD-10-CM | POA: Insufficient documentation

## 2012-12-03 DIAGNOSIS — Z113 Encounter for screening for infections with a predominantly sexual mode of transmission: Secondary | ICD-10-CM | POA: Insufficient documentation

## 2012-12-03 DIAGNOSIS — N951 Menopausal and female climacteric states: Secondary | ICD-10-CM

## 2012-12-03 DIAGNOSIS — Z1151 Encounter for screening for human papillomavirus (HPV): Secondary | ICD-10-CM | POA: Insufficient documentation

## 2012-12-03 DIAGNOSIS — R300 Dysuria: Secondary | ICD-10-CM

## 2012-12-03 DIAGNOSIS — R232 Flushing: Secondary | ICD-10-CM | POA: Insufficient documentation

## 2012-12-03 LAB — POCT URINALYSIS DIP (DEVICE)
Glucose, UA: 500 mg/dL — AB
Nitrite: NEGATIVE
Protein, ur: NEGATIVE mg/dL
Urobilinogen, UA: 0.2 mg/dL (ref 0.0–1.0)
pH: 5.5 (ref 5.0–8.0)

## 2012-12-03 NOTE — Progress Notes (Signed)
CC: Gynecologic Exam    HPI Michelle Brooks is a 47 y.o. [id number]  who presents for Pap and Annual GYN exam. LMP 10.20.14. Menses irregular over past several months, at times skips a month and has heavy flow with cramps. No metrorhagia. Has hot flashes and sx. improved on Effexor XR from PMD.  Had neg mammogram this year. Did not take any meds this am. Has regular F/U for HTN, Type 2 DM, obesity.   Problem list, past medical history, Ob/Gyn history, surgical history, family history and social history reviewed and updated as appropriate.     Past Surgical History  Procedure Laterality Date  . Breast lumpectomy  2002    states benign lymph node removed.      Current Outpatient Prescriptions on File Prior to Visit  Medication Sig Dispense Refill  . Blood Glucose Monitoring Suppl (ACCU-CHEK AVIVA PLUS) W/DEVICE KIT 1 kit by Does not apply route 3 (three) times daily before meals.  1 kit  0  . glucose blood (ACCU-CHEK AVIVA) test strip Use as instructed  100 each  12  . lisinopril-hydrochlorothiazide (PRINZIDE) 10-12.5 MG per tablet Take 1 tablet by mouth daily.  30 tablet  0  . metFORMIN (GLUCOPHAGE) 500 MG tablet Take 1 tablet (500 mg total) by mouth daily with breakfast.  90 tablet  1  . pravastatin (PRAVACHOL) 40 MG tablet Take 1 tablet (40 mg total) by mouth daily.  30 tablet  5  . aspirin EC 325 MG tablet Take 1 tablet (325 mg total) by mouth daily.  100 tablet  3  . Multiple Vitamins-Minerals (MULTIVITAMIN WITH MINERALS) tablet Take 1 tablet by mouth daily.       No current facility-administered medications on file prior to visit.    No Known Allergies  ROS Pertinent items in HPI  PHYSICAL EXAM Filed Vitals:   12/03/12 0950  BP: 147/89  Pulse: 83  Temp: 97.1 F (36.2 C)   General: Obese female in no acute distress Cardiovascular: Normal rate Respiratory: Normal effort Breast: rt upper inner scar from past lumpectomy; pendulous, no dimpling or retraction, no discrete  masses, no lymphadenopathy Abdomen: Soft, nontender Back: No CVAT Extremities: No edema Neurologic: Alert and oriented Speculum exam: NEFG without apparent atrophic changes; vagina with physiologic discharge, scanat brown blood; cervix clean. Pap, GC/CT done Bimanual exam: cervix closed, no CMT; uterus unable to outline d/t body habitus; no adnexal tenderness or masses   LAB RESULTS Results for orders placed in visit on 12/03/12 (from the past 24 hour(s))  POCT URINALYSIS DIP (DEVICE)     Status: Abnormal   Collection Time    12/03/12 10:15 AM      Result Value Range   Glucose, UA 500 (*) NEGATIVE mg/dL   Bilirubin Urine NEGATIVE  NEGATIVE   Ketones, ur NEGATIVE  NEGATIVE mg/dL   Specific Gravity, Urine 1.025  1.005 - 1.030   Hgb urine dipstick MODERATE (*) NEGATIVE   pH 5.5  5.0 - 8.0   Protein, ur NEGATIVE  NEGATIVE mg/dL   Urobilinogen, UA 0.2  0.0 - 1.0 mg/dL   Nitrite NEGATIVE  NEGATIVE   Leukocytes, UA NEGATIVE  NEGATIVE     ASSESSMENT  1. Dysuria   2. Well woman exam   3. Hot flashes     PLAN Healthylifestyle information given. Force fluids Continue yearly mammograms Pap, GC/CT sent. Advised need for yearly GYN exam but Pap q 3 years since all negative in the past.     Veeda Virgo  Colin Mulders, CNM 12/03/2012 10:48 AM

## 2012-12-03 NOTE — Progress Notes (Signed)
Here for annual checkup. C/o sharp pains when she urinates.

## 2012-12-03 NOTE — Patient Instructions (Signed)

## 2013-01-04 ENCOUNTER — Other Ambulatory Visit: Payer: Self-pay | Admitting: Internal Medicine

## 2013-01-04 DIAGNOSIS — M545 Low back pain, unspecified: Secondary | ICD-10-CM

## 2013-01-13 ENCOUNTER — Ambulatory Visit
Admission: RE | Admit: 2013-01-13 | Discharge: 2013-01-13 | Disposition: A | Discharge status: Home / Self Care | Payer: Medicaid Other | Source: Ambulatory Visit | Attending: Internal Medicine | Admitting: Internal Medicine

## 2013-01-13 DIAGNOSIS — M545 Low back pain: Secondary | ICD-10-CM

## 2013-06-01 ENCOUNTER — Other Ambulatory Visit (HOSPITAL_COMMUNITY): Payer: Self-pay | Admitting: Internal Medicine

## 2013-06-01 DIAGNOSIS — Z1231 Encounter for screening mammogram for malignant neoplasm of breast: Secondary | ICD-10-CM

## 2013-07-05 ENCOUNTER — Ambulatory Visit (HOSPITAL_COMMUNITY)
Admission: RE | Admit: 2013-07-05 | Discharge: 2013-07-05 | Disposition: A | Discharge status: Home / Self Care | Payer: Medicaid Other | Source: Ambulatory Visit | Attending: Internal Medicine | Admitting: Internal Medicine

## 2013-07-05 ENCOUNTER — Encounter (INDEPENDENT_AMBULATORY_CARE_PROVIDER_SITE_OTHER): Payer: Self-pay

## 2013-07-05 DIAGNOSIS — Z1231 Encounter for screening mammogram for malignant neoplasm of breast: Secondary | ICD-10-CM

## 2013-12-12 ENCOUNTER — Encounter: Payer: Self-pay | Admitting: Family Medicine

## 2014-07-25 ENCOUNTER — Ambulatory Visit: Payer: Medicaid Other

## 2014-08-03 ENCOUNTER — Ambulatory Visit: Payer: Medicaid Other

## 2014-08-18 ENCOUNTER — Emergency Department (HOSPITAL_COMMUNITY): Payer: Medicaid Other

## 2014-08-18 ENCOUNTER — Encounter (HOSPITAL_COMMUNITY): Payer: Self-pay | Admitting: Emergency Medicine

## 2014-08-18 ENCOUNTER — Inpatient Hospital Stay (HOSPITAL_COMMUNITY)
Admission: EM | Admit: 2014-08-18 | Discharge: 2014-08-22 | DRG: 066 | Disposition: A | Discharge status: Home / Self Care | Payer: Medicaid Other | Attending: Internal Medicine | Admitting: Internal Medicine

## 2014-08-18 DIAGNOSIS — R4781 Slurred speech: Secondary | ICD-10-CM | POA: Diagnosis present

## 2014-08-18 DIAGNOSIS — Z79899 Other long term (current) drug therapy: Secondary | ICD-10-CM

## 2014-08-18 DIAGNOSIS — M545 Low back pain: Secondary | ICD-10-CM | POA: Diagnosis present

## 2014-08-18 DIAGNOSIS — R200 Anesthesia of skin: Secondary | ICD-10-CM | POA: Diagnosis present

## 2014-08-18 DIAGNOSIS — E11319 Type 2 diabetes mellitus with unspecified diabetic retinopathy without macular edema: Secondary | ICD-10-CM | POA: Diagnosis present

## 2014-08-18 DIAGNOSIS — I10 Essential (primary) hypertension: Secondary | ICD-10-CM | POA: Diagnosis present

## 2014-08-18 DIAGNOSIS — Z794 Long term (current) use of insulin: Secondary | ICD-10-CM | POA: Diagnosis not present

## 2014-08-18 DIAGNOSIS — E669 Obesity, unspecified: Secondary | ICD-10-CM | POA: Diagnosis present

## 2014-08-18 DIAGNOSIS — Z01419 Encounter for gynecological examination (general) (routine) without abnormal findings: Secondary | ICD-10-CM

## 2014-08-18 DIAGNOSIS — E1169 Type 2 diabetes mellitus with other specified complication: Secondary | ICD-10-CM | POA: Diagnosis present

## 2014-08-18 DIAGNOSIS — E785 Hyperlipidemia, unspecified: Secondary | ICD-10-CM | POA: Diagnosis present

## 2014-08-18 DIAGNOSIS — I63011 Cerebral infarction due to thrombosis of right vertebral artery: Secondary | ICD-10-CM

## 2014-08-18 DIAGNOSIS — Z87891 Personal history of nicotine dependence: Secondary | ICD-10-CM | POA: Diagnosis not present

## 2014-08-18 DIAGNOSIS — E1165 Type 2 diabetes mellitus with hyperglycemia: Secondary | ICD-10-CM | POA: Diagnosis present

## 2014-08-18 DIAGNOSIS — Z7982 Long term (current) use of aspirin: Secondary | ICD-10-CM | POA: Diagnosis not present

## 2014-08-18 DIAGNOSIS — I639 Cerebral infarction, unspecified: Secondary | ICD-10-CM | POA: Diagnosis present

## 2014-08-18 DIAGNOSIS — E1142 Type 2 diabetes mellitus with diabetic polyneuropathy: Secondary | ICD-10-CM | POA: Diagnosis present

## 2014-08-18 DIAGNOSIS — R208 Other disturbances of skin sensation: Secondary | ICD-10-CM | POA: Diagnosis present

## 2014-08-18 DIAGNOSIS — G8929 Other chronic pain: Secondary | ICD-10-CM | POA: Diagnosis present

## 2014-08-18 DIAGNOSIS — E869 Volume depletion, unspecified: Secondary | ICD-10-CM | POA: Diagnosis present

## 2014-08-18 DIAGNOSIS — R29810 Facial weakness: Secondary | ICD-10-CM | POA: Diagnosis present

## 2014-08-18 DIAGNOSIS — E0840 Diabetes mellitus due to underlying condition with diabetic neuropathy, unspecified: Secondary | ICD-10-CM

## 2014-08-18 DIAGNOSIS — IMO0002 Reserved for concepts with insufficient information to code with codable children: Secondary | ICD-10-CM

## 2014-08-18 DIAGNOSIS — R300 Dysuria: Secondary | ICD-10-CM

## 2014-08-18 DIAGNOSIS — E118 Type 2 diabetes mellitus with unspecified complications: Secondary | ICD-10-CM

## 2014-08-18 DIAGNOSIS — Z8673 Personal history of transient ischemic attack (TIA), and cerebral infarction without residual deficits: Secondary | ICD-10-CM

## 2014-08-18 DIAGNOSIS — R232 Flushing: Secondary | ICD-10-CM

## 2014-08-18 HISTORY — DX: Other chronic pain: G89.29

## 2014-08-18 HISTORY — DX: Dorsalgia, unspecified: M54.9

## 2014-08-18 HISTORY — DX: Headache: R51

## 2014-08-18 HISTORY — DX: Headache, unspecified: R51.9

## 2014-08-18 HISTORY — DX: Type 2 diabetes mellitus with unspecified diabetic retinopathy without macular edema: E11.319

## 2014-08-18 LAB — COMPREHENSIVE METABOLIC PANEL
ALT: 14 U/L (ref 14–54)
AST: 14 U/L — ABNORMAL LOW (ref 15–41)
Albumin: 3.6 g/dL (ref 3.5–5.0)
Alkaline Phosphatase: 98 U/L (ref 38–126)
Anion gap: 10 (ref 5–15)
BUN: 13 mg/dL (ref 6–20)
CO2: 25 mmol/L (ref 22–32)
CREATININE: 0.9 mg/dL (ref 0.44–1.00)
Calcium: 9.3 mg/dL (ref 8.9–10.3)
Chloride: 98 mmol/L — ABNORMAL LOW (ref 101–111)
Glucose, Bld: 375 mg/dL — ABNORMAL HIGH (ref 65–99)
Potassium: 3.8 mmol/L (ref 3.5–5.1)
SODIUM: 133 mmol/L — AB (ref 135–145)
Total Bilirubin: 0.5 mg/dL (ref 0.3–1.2)
Total Protein: 7 g/dL (ref 6.5–8.1)

## 2014-08-18 LAB — URINALYSIS, ROUTINE W REFLEX MICROSCOPIC
Bilirubin Urine: NEGATIVE
Hgb urine dipstick: NEGATIVE
Ketones, ur: 15 mg/dL — AB
Leukocytes, UA: NEGATIVE
Nitrite: NEGATIVE
Protein, ur: NEGATIVE mg/dL
SPECIFIC GRAVITY, URINE: 1.039 — AB (ref 1.005–1.030)
Urobilinogen, UA: 0.2 mg/dL (ref 0.0–1.0)
pH: 6 (ref 5.0–8.0)

## 2014-08-18 LAB — URINE MICROSCOPIC-ADD ON

## 2014-08-18 LAB — GLUCOSE, CAPILLARY
Glucose-Capillary: 417 mg/dL — ABNORMAL HIGH (ref 65–99)
Glucose-Capillary: 286 mg/dL — ABNORMAL HIGH (ref 65–99)

## 2014-08-18 LAB — CBC WITH DIFFERENTIAL/PLATELET
BASOS PCT: 0 % (ref 0–1)
Basophils Absolute: 0 10*3/uL (ref 0.0–0.1)
EOS ABS: 0.1 10*3/uL (ref 0.0–0.7)
Eosinophils Relative: 1 % (ref 0–5)
HEMATOCRIT: 41.9 % (ref 36.0–46.0)
Hemoglobin: 14.1 g/dL (ref 12.0–15.0)
Lymphocytes Relative: 25 % (ref 12–46)
Lymphs Abs: 2 10*3/uL (ref 0.7–4.0)
MCH: 27.9 pg (ref 26.0–34.0)
MCHC: 33.7 g/dL (ref 30.0–36.0)
MCV: 83 fL (ref 78.0–100.0)
MONO ABS: 0.5 10*3/uL (ref 0.1–1.0)
Monocytes Relative: 6 % (ref 3–12)
Neutro Abs: 5.4 10*3/uL (ref 1.7–7.7)
Neutrophils Relative %: 68 % (ref 43–77)
Platelets: 315 10*3/uL (ref 150–400)
RBC: 5.05 MIL/uL (ref 3.87–5.11)
RDW: 13 % (ref 11.5–15.5)
WBC: 8.1 10*3/uL (ref 4.0–10.5)

## 2014-08-18 LAB — CBG MONITORING, ED: Glucose-Capillary: 327 mg/dL — ABNORMAL HIGH (ref 65–99)

## 2014-08-18 LAB — LIPASE, BLOOD: LIPASE: 28 U/L (ref 22–51)

## 2014-08-18 LAB — PREGNANCY, URINE: Preg Test, Ur: NEGATIVE

## 2014-08-18 LAB — TROPONIN I

## 2014-08-18 MED ORDER — SENNOSIDES-DOCUSATE SODIUM 8.6-50 MG PO TABS: 1.0000 | ORAL_TABLET | Freq: Every evening | ORAL | Status: DC | PRN

## 2014-08-18 MED ORDER — STROKE: EARLY STAGES OF RECOVERY BOOK
Freq: Once | Status: AC
  Administered 2014-08-18: 15:00:00

## 2014-08-18 MED ORDER — SODIUM CHLORIDE 0.9 % IV BOLUS (SEPSIS)
500.0000 mL | Freq: Once | INTRAVENOUS | Status: AC
  Administered 2014-08-18: 500 mL via INTRAVENOUS

## 2014-08-18 MED ORDER — LINAGLIPTIN 5 MG PO TABS
5.0000 mg | ORAL_TABLET | Freq: Every day | ORAL | Status: DC
  Administered 2014-08-18 – 2014-08-22 (×6): 5 mg via ORAL
  Filled 2014-08-18 (×5): qty 1

## 2014-08-18 MED ORDER — INSULIN GLARGINE 100 UNIT/ML ~~LOC~~ SOLN
25.0000 [IU] | Freq: Every day | SUBCUTANEOUS | Status: DC
  Administered 2014-08-18: 25 [IU] via SUBCUTANEOUS
  Filled 2014-08-18 (×2): qty 0.25

## 2014-08-18 MED ORDER — INSULIN ASPART 100 UNIT/ML ~~LOC~~ SOLN: 0.0000 [IU] | Freq: Three times a day (TID) | SUBCUTANEOUS | Status: DC

## 2014-08-18 MED ORDER — PROMETHAZINE HCL 25 MG/ML IJ SOLN
12.5000 mg | Freq: Once | INTRAMUSCULAR | Status: AC
  Administered 2014-08-18: 12.5 mg via INTRAVENOUS
  Filled 2014-08-18: qty 1

## 2014-08-18 MED ORDER — ASPIRIN 325 MG PO TABS
325.0000 mg | ORAL_TABLET | Freq: Every day | ORAL | Status: DC
  Administered 2014-08-18: 325 mg via ORAL
  Filled 2014-08-18: qty 1

## 2014-08-18 MED ORDER — LISINOPRIL 10 MG PO TABS
10.0000 mg | ORAL_TABLET | Freq: Every day | ORAL | Status: DC
  Administered 2014-08-18 – 2014-08-20 (×3): 10 mg via ORAL
  Filled 2014-08-18 (×3): qty 1

## 2014-08-18 MED ORDER — VENLAFAXINE HCL 37.5 MG PO TABS
37.5000 mg | ORAL_TABLET | Freq: Two times a day (BID) | ORAL | Status: DC
  Administered 2014-08-18 – 2014-08-22 (×8): 37.5 mg via ORAL
  Filled 2014-08-18 (×9): qty 1

## 2014-08-18 MED ORDER — INSULIN ASPART 100 UNIT/ML ~~LOC~~ SOLN
0.0000 [IU] | Freq: Every day | SUBCUTANEOUS | Status: DC
  Administered 2014-08-18: 3 [IU] via SUBCUTANEOUS

## 2014-08-18 MED ORDER — INSULIN ASPART 100 UNIT/ML ~~LOC~~ SOLN
0.0000 [IU] | Freq: Three times a day (TID) | SUBCUTANEOUS | Status: DC
  Administered 2014-08-18: 23 [IU] via SUBCUTANEOUS
  Administered 2014-08-19: 7 [IU] via SUBCUTANEOUS
  Administered 2014-08-19 – 2014-08-20 (×3): 11 [IU] via SUBCUTANEOUS
  Administered 2014-08-20: 4 [IU] via SUBCUTANEOUS
  Administered 2014-08-20: 7 [IU] via SUBCUTANEOUS

## 2014-08-18 MED ORDER — ENOXAPARIN SODIUM 40 MG/0.4ML ~~LOC~~ SOLN
40.0000 mg | SUBCUTANEOUS | Status: DC
  Administered 2014-08-18 – 2014-08-21 (×5): 40 mg via SUBCUTANEOUS
  Filled 2014-08-18 (×4): qty 0.4

## 2014-08-18 MED ORDER — INSULIN ASPART 100 UNIT/ML ~~LOC~~ SOLN: 0.0000 [IU] | Freq: Every day | SUBCUTANEOUS | Status: DC

## 2014-08-18 MED ORDER — SODIUM CHLORIDE 0.9 % IV SOLN: INTRAVENOUS | Status: DC

## 2014-08-18 MED ORDER — SODIUM CHLORIDE 0.9 % IV SOLN
INTRAVENOUS | Status: DC
  Administered 2014-08-18 – 2014-08-19 (×2): via INTRAVENOUS

## 2014-08-18 MED ORDER — INSULIN ASPART 100 UNIT/ML ~~LOC~~ SOLN
3.0000 [IU] | Freq: Three times a day (TID) | SUBCUTANEOUS | Status: DC
  Administered 2014-08-19: 3 [IU] via SUBCUTANEOUS

## 2014-08-18 MED ORDER — INSULIN ASPART PROT & ASPART (70-30 MIX) 100 UNIT/ML ~~LOC~~ SUSP
30.0000 [IU] | Freq: Two times a day (BID) | SUBCUTANEOUS | Status: DC
  Filled 2014-08-18: qty 10

## 2014-08-18 MED ORDER — PRAVASTATIN SODIUM 40 MG PO TABS
40.0000 mg | ORAL_TABLET | Freq: Every day | ORAL | Status: DC
  Administered 2014-08-18 – 2014-08-22 (×5): 40 mg via ORAL
  Filled 2014-08-18 (×5): qty 1

## 2014-08-18 NOTE — ED Provider Notes (Signed)
CSN: [id number]     Arrival date & time 08/18/14  8676 History   First MD Initiated Contact with Patient 08/18/14 [phone]     Chief Complaint  Patient presents with  . Numbness  . Nausea      HPI Pt was seen at 0950. Per pt, c/o gradual onset and persistence of constant right lower face "numbness" since 08/14/14 (4 days ago).  Pt states [place] cruise ship" when her symptoms began. Pt states she also feels like she is "leaning to the right more than usual" when she walks (states this is residual from previous CVA). Pt also c/o several episodes of vomiting, as well as persistent nausea for the past 4 days. Pt states she "just doesn't feel well." Unknown CBG's at home due to being "unable to check my sugar." Denies diarrhea, no abd pain, no black or blood in stools or emesis, no fevers, no rash, no neck or back pain, no CP/SOB, no focal motor weakness, no new tingling/numbness in extremities from baseline, no facial droop, no slurred speech.    Past Medical History  Diagnosis Date  . Diabetes mellitus type 2 in obese   . Hypertension   . History of stroke 03/2010    Right MCA stroke in 03/2010, with MRI also noting subacute strokes in left fronto parietal area - occured in [place] received care at [facility] thought due to uncontrolled blood pressure // No residual deficits // TTE (03/2010) at [facility] - normal LV systolic function, moderate pericardial effusion with 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  [name] Hyperlipidemia LDL goal < 100 04/01/2011  . Chronic back pain   . Headache   . Peripheral neuropathy    Past Surgical History  Procedure Laterality Date  . Breast lumpectomy  2002    states benign lymph node removed.   Family History  Problem Relation Age of Onset  . Diabetes Mother   . Stroke Mother 6  . Diabetes Brother   . Diabetes Brother   . Cancer Brother 48    unknown type  . Hypertension Brother    History   Substance Use Topics  . Smoking status: Former Smoker -- 0.20 packs/day for 7 years    Types: Cigarettes    Quit date: 03/26/2004  . Smokeless tobacco: Never Used  . Alcohol Use: Yes     Comment: social rarely   OB History    Gravida Para Term Preterm AB TAB SAB Ectopic Multiple Living   '5 3 3  2 2         ' Review of Systems ROS: Statement: All systems negative except as marked or noted in the HPI; Constitutional: Negative for fever and chills. ; ; Eyes: Negative for eye pain, redness and discharge. ; ; ENMT: Negative for ear pain, hoarseness, nasal congestion, sinus pressure and sore throat. ; ; Cardiovascular: Negative for chest pain, palpitations, diaphoresis, dyspnea and peripheral edema. ; ; Respiratory: Negative for cough, wheezing and stridor. ; ; Gastrointestinal: +N/V. Negative for diarrhea, abdominal pain, blood in stool, hematemesis, jaundice and rectal bleeding. . ; ; Genitourinary: Negative for dysuria, flank pain and hematuria. ; ; Musculoskeletal: Negative for back pain and neck pain. Negative for swelling and trauma.; ; Skin: Negative for pruritus, rash, abrasions, blisters, bruising and skin lesion.; ; Neuro: +right sided facial numbness, "worsening" ataxia. Negative for headache, lightheadedness and neck stiffness. Negative for weakness, altered level of consciousness , altered  mental status, extremity weakness, involuntary movement, seizure and syncope.      Allergies  Review of patient's allergies indicates no known allergies.  Home Medications   Prior to Admission medications   Medication Sig Start Date End Date Taking? Authorizing Provider  aspirin 325 MG tablet Take 325 mg by mouth daily.    Historical Provider, MD  aspirin EC 325 MG tablet Take 1 tablet (325 mg total) by mouth daily. 10/23/11 11/12/12  Maitri S Kalia-Reynolds, DO  Blood Glucose Monitoring Suppl (ACCU-CHEK AVIVA PLUS) W/DEVICE KIT 1 kit by Does not apply route 3 (three) times daily before meals.  06/24/12   Maitri S Kalia-Reynolds, DO  gabapentin (NEURONTIN) 300 MG capsule Take 300 mg by mouth 3 (three) times daily.    Historical Provider, MD  glucose blood (ACCU-CHEK AVIVA) test strip Use as instructed 06/24/12   Maitri S Kalia-Reynolds, DO  insulin lispro protamine-lispro (HUMALOG 50/50) (50-50) 100 UNIT/ML SUSP injection Inject 50 Units into the skin 3 (three) times daily. Up to 60 depending on cbg    Historical Provider, MD  lisinopril-hydrochlorothiazide (PRINZIDE) 10-12.5 MG per tablet Take 1 tablet by mouth daily. 10/25/12 10/25/13  Ejiroghene Arlyce Dice, MD  metFORMIN (GLUCOPHAGE) 500 MG tablet Take 1 tablet (500 mg total) by mouth daily with breakfast. 04/13/12 04/13/13  Maitri S Kalia-Reynolds, DO  Multiple Vitamins-Minerals (MULTIVITAMIN WITH MINERALS) tablet Take 1 tablet by mouth daily.    Historical Provider, MD  pravastatin (PRAVACHOL) 40 MG tablet Take 1 tablet (40 mg total) by mouth daily. 05/14/12   Maitri S Kalia-Reynolds, DO  sitaGLIPtin (JANUVIA) 100 MG tablet Take 100 mg by mouth daily.    Historical Provider, MD  venlafaxine (EFFEXOR) 37.5 MG tablet Take 37.5 mg by mouth daily.    Historical Provider, MD   BP 140/89 mmHg  Pulse 78  Resp 15  SpO2 96%  LMP 07/25/2014   12:42 Orthostatic Vital Signs KH  Orthostatic Lying  - BP- Lying: 148/87 mmHg ; Pulse- Lying: 78  Orthostatic Sitting - BP- Sitting: 155/83 mmHg ; Pulse- Sitting: 81  Orthostatic Standing at 0 minutes - BP- Standing at 0 minutes: 145/103 mmHg ; Pulse- Standing at 0 minutes: 104      Physical Exam  0955: Physical examination:  Nursing notes reviewed; Vital signs and O2 SAT reviewed;  Constitutional: Well developed, Well nourished, Well hydrated, In no acute distress; Head:  Normocephalic, atraumatic; Eyes: EOMI, PERRL, No scleral icterus; ENMT: TM's clear bilat. +edemetous nasal turbinates bilat with clear rhinorrhea. Mouth and pharynx normal, Mucous membranes moist; Neck: Supple, Full range of motion, No  lymphadenopathy; Cardiovascular: Regular rate and rhythm, No gallop; Respiratory: Breath sounds clear & equal bilaterally, No wheezes.  Speaking full sentences with ease, Normal respiratory effort/excursion; Chest: Nontender, Movement normal; Abdomen: Soft, Nontender, Nondistended, Normal bowel sounds; Genitourinary: No CVA tenderness; Extremities: Pulses normal, No tenderness, No edema, No calf edema or asymmetry.; Neuro: AA&Ox3, Major CN grossly intact. Speech clear.  No facial droop.  No nystagmus. Grips equal. Strength 5/5 equal bilat UE's and LE's.  DTR 2/4 equal bilat UE's and LE's.  +subjective decreased sensation right lower face, otherwise no gross sensory deficits.  Normal cerebellar testing bilat UE's (finger-nose) and LE's (heel-shin)..; Skin: Color normal, Warm, Dry.    ED Course  Procedures     EKG Interpretation   Date/Time:  Friday August 18 2014 09:50:44 EDT Ventricular Rate:  86 PR Interval:  169 QRS Duration: 97 QT Interval:  420 QTC Calculation: 502 R Axis:  56 Text Interpretation:  Sinus rhythm Borderline prolonged QT interval No old  tracing to compare Confirmed by [facility]  MD, Nunzio Cory [phone]) on 08/18/2014  10:02:42 AM      MDM  MDM Reviewed: previous chart, nursing note and vitals Reviewed previous: labs Interpretation: labs, x-ray and MRI     Results for orders placed or performed during the hospital encounter of 08/18/14  Troponin I  Result Value Ref Range   Troponin I <0.03 <0.031 ng/mL  CBC with Differential  Result Value Ref Range   WBC 8.1 4.0 - 10.5 K/uL   RBC 5.05 3.87 - 5.11 MIL/uL   Hemoglobin 14.1 12.0 - 15.0 g/dL   HCT 41.9 36.0 - 46.0 %   MCV 83.0 78.0 - 100.0 fL   MCH 27.9 26.0 - 34.0 pg   MCHC 33.7 30.0 - 36.0 g/dL   RDW 13.0 11.5 - 15.5 %   Platelets 315 150 - 400 K/uL   Neutrophils Relative % 68 43 - 77 %   Neutro Abs 5.4 1.7 - 7.7 K/uL   Lymphocytes Relative 25 12 - 46 %   Lymphs Abs 2.0 0.7 - 4.0 K/uL   Monocytes Relative 6 3  - 12 %   Monocytes Absolute 0.5 0.1 - 1.0 K/uL   Eosinophils Relative 1 0 - 5 %   Eosinophils Absolute 0.1 0.0 - 0.7 K/uL   Basophils Relative 0 0 - 1 %   Basophils Absolute 0.0 0.0 - 0.1 K/uL  Comprehensive metabolic panel  Result Value Ref Range   Sodium 133 (L) 135 - 145 mmol/L   Potassium 3.8 3.5 - 5.1 mmol/L   Chloride 98 (L) 101 - 111 mmol/L   CO2 25 22 - 32 mmol/L   Glucose, Bld 375 (H) 65 - 99 mg/dL   BUN 13 6 - 20 mg/dL   Creatinine, Ser 0.90 0.44 - 1.00 mg/dL   Calcium 9.3 8.9 - 10.3 mg/dL   Total Protein 7.0 6.5 - 8.1 g/dL   Albumin 3.6 3.5 - 5.0 g/dL   AST 14 (L) 15 - 41 U/L   ALT 14 14 - 54 U/L   Alkaline Phosphatase 98 38 - 126 U/L   Total Bilirubin 0.5 0.3 - 1.2 mg/dL   GFR calc non Af Amer >60 >60 mL/min   GFR calc Af Amer >60 >60 mL/min   Anion gap 10 5 - 15  Lipase, blood  Result Value Ref Range   Lipase 28 22 - 51 U/L  Urinalysis, Routine w reflex microscopic (not at [facility])  Result Value Ref Range   Color, Urine YELLOW YELLOW   APPearance CLEAR CLEAR   Specific Gravity, Urine 1.039 (H) 1.005 - 1.030   pH 6.0 5.0 - 8.0   Glucose, UA >1000 (A) NEGATIVE mg/dL   Hgb urine dipstick NEGATIVE NEGATIVE   Bilirubin Urine NEGATIVE NEGATIVE   Ketones, ur 15 (A) NEGATIVE mg/dL   Protein, ur NEGATIVE NEGATIVE mg/dL   Urobilinogen, UA 0.2 0.0 - 1.0 mg/dL   Nitrite NEGATIVE NEGATIVE   Leukocytes, UA NEGATIVE NEGATIVE  Pregnancy, urine  Result Value Ref Range   Preg Test, Ur NEGATIVE NEGATIVE  Urine microscopic-add on  Result Value Ref Range   Squamous Epithelial / LPF FEW (A) RARE   WBC, UA 0-2 <3 WBC/hpf   Bacteria, UA RARE RARE  CBG monitoring, ED  Result Value Ref Range   Glucose-Capillary 327 (H) 65 - 99 mg/dL   Mr Brain Wo Contrast (neuro Protocol) 08/18/2014  CLINICAL DATA:  RIGHT-sided facial numbness and leaning to the RIGHT when walking. Symptoms started 4 days ago and have been getting worse. Stroke risk factors include diabetes mellitus,  hypertension, history of stroke, and hyperlipidemia.  EXAM: MRI HEAD WITHOUT CONTRAST  TECHNIQUE: Multiplanar, multiecho pulse sequences of the brain and surrounding structures were obtained without intravenous contrast.  COMPARISON:  None.  FINDINGS: There is restricted diffusion involving the RIGHT lateral medulla and RIGHT inferior cerebellar peduncle as seen on DWI image 7 series 4 and image 13 series 5. Findings are consistent with an acute RIGHT posterior inferior cerebellar artery territory infarct. No visible hemorrhage, mass lesion, hydrocephalus, or extra-axial fluid.  Normal for age cerebral volume. Remote cerebral infarct affects the RIGHT frontal opercular cortex and subcortical white matter. Small subcortical RIGHT parietal lacunar infarct also chronic. No significant white matter disease.  There is diminished or absent flow void in the distal RIGHT vertebral artery which could indicate stenosis or recent occlusion. The LEFT vertebral is patent as is the basilar and both carotids. Difficult to confirm or exclude RIGHT PICA patency on this routine brain MR. MRA intracranial could provide additional information.  Partial empty sella. No tonsillar herniation. Upper cervical region and extracranial soft tissues unremarkable. Suspected intraparotid lymph node on the LEFT is incompletely evaluated.  IMPRESSION: Acute subcentimeter infarct of the RIGHT lateral medulla and RIGHT inferior cerebellar peduncle, within the distribution of the RIGHT posterior-inferior cerebellar artery.  Diminished or absent flow void in the distal RIGHT vertebral artery could represent stenosis or occlusion.  Areas of chronic infarction in the RIGHT hemisphere affecting the frontal and parietal lobes as described. No foci of acute or chronic hemorrhage.   Electronically Signed   By: Staci Righter M.D.   On: 08/18/2014 12:20   Dg Abd Acute W/chest 08/18/2014   CLINICAL DATA:  Right facial numbness and leaning to the right while  walking today. History of prior stroke. Initial encounter.  EXAM: DG ABDOMEN ACUTE W/ 1V CHEST  COMPARISON:  None.  FINDINGS: Single view of the chest demonstrates clear lungs and normal heart size. No pneumothorax or pleural effusion.  Two views of the abdomen show no free intraperitoneal air. The bowel gas pattern is normal. No unexpected abdominal calcification is identified.  IMPRESSION: Negative exam.   Electronically Signed   By: Inge Rise M.D.   On: 08/18/2014 12:44    1000:  Pt not code stroke due to delay in presentation ([facility]KW 08/14/14).   1300:  Pt feels "lightheaded" during orthostatic VS. CT-H with multiple acute infarcts. RN to perform swallow screen. T/C to Neuro Dr. Nicole Kindred, case discussed, including:  HPI, pertinent PM/SHx, VS/PE, dx testing, ED course and treatment:  Agreeable to consult.   1400:  T/C to [facility] NP Lissa Merlin, case discussed, including:  HPI, pertinent PM/SHx, VS/PE, dx testing, ED course and treatment:  Agreeable to admit, requests they will come to the ED for evaluation.   Francine Graven, DO 08/20/14 1121

## 2014-08-18 NOTE — Consult Note (Signed)
Admission H&P    Chief Complaint: Right facial numbness and right side weakness.  HPI: Michelle Brooks is an 49 y.o. female history diabetes mellitus, hypertension, cerebral infarction, hyperlipidemia and peripheral neuropathy, presenting with right facial numbness and weakness on her right side with tendency to stumble towards the right when she walks. Symptoms began on 08/14/2014 area she's been taking aspirin daily. MRI of the brain showed small acute stroke involving right lateral range stem and cerebellum peduncle. There was also diminished to absent flow in the right vertebral artery. Areas of chronic infarction were noted in the right frontal and parietal regions. NIH stroke score was 3.  LSN: 08/14/2014 tPA Given: No: Beyond time window for treatment consideration mRankin:  Past Medical History  Diagnosis Date  . Diabetes mellitus type 2 in obese   . Hypertension   . History of stroke 03/2010    Right MCA stroke in 03/2010, with MRI also noting subacute strokes in left fronto parietal area - occured in [place] received care at [facility] thought due to uncontrolled blood pressure // No residual deficits // TTE (03/2010) at [facility] - normal LV systolic function, moderate pericardial effusion with 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  [name] Hyperlipidemia LDL goal < 100 04/01/2011  . Chronic back pain   . Headache   . Peripheral neuropathy     Past Surgical History  Procedure Laterality Date  . Breast lumpectomy  2002    states benign lymph node removed.    Family History  Problem Relation Age of Onset  . Diabetes Mother   . Stroke Mother 62  . Diabetes Brother   . Diabetes Brother   . Cancer Brother 36    unknown type  . Hypertension Brother    Social History:  reports that she quit smoking about 10 years ago. Her smoking use included Cigarettes. She has a 1.4 pack-year smoking history. She has never used smokeless  tobacco. She reports that she drinks alcohol. She reports that she does not use illicit drugs.  Allergies: No Known Allergies  Medications: Patient's preadmission medications were reviewed by me.  ROS: History obtained from the patient  General ROS: negative for - chills, fatigue, fever, night sweats, weight gain or weight loss Psychological ROS: negative for - behavioral disorder, hallucinations, memory difficulties, mood swings or suicidal ideation Ophthalmic ROS: negative for - blurry vision, double vision, eye pain or loss of vision ENT ROS: negative for - epistaxis, nasal discharge, oral lesions, sore throat, tinnitus or vertigo Allergy and Immunology ROS: negative for - hives or itchy/watery eyes Hematological and Lymphatic ROS: negative for - bleeding problems, bruising or swollen lymph nodes Endocrine ROS: negative for - galactorrhea, hair pattern changes, polydipsia/polyuria or temperature intolerance Respiratory ROS: negative for - cough, hemoptysis, shortness of breath or wheezing Cardiovascular ROS: negative for - chest pain, dyspnea on exertion, edema or irregular heartbeat Gastrointestinal ROS: negative for - abdominal pain, diarrhea, hematemesis, nausea/vomiting or stool incontinence Genito-Urinary ROS: negative for - dysuria, hematuria, incontinence or urinary frequency/urgency Musculoskeletal ROS: negative for - joint swelling or muscular weakness Neurological ROS: as noted in HPI Dermatological ROS: negative for rash and skin lesion changes  Physical Examination: Blood pressure 139/70, pulse 75, resp. rate 12, last menstrual period 07/25/2014, SpO2 96 %.  HEENT-  Normocephalic, no lesions, without obvious abnormality.  Normal external eye and conjunctiva.  Normal TM's bilaterally.  Normal auditory canals and external ears. Normal external nose,  mucus membranes and septum.  Normal pharynx. Neck supple with no masses, nodes, nodules or enlargement. Cardiovascular -  regular rate and rhythm, S1, S2 normal, no murmur, click, rub or gallop Lungs - chest clear, no wheezing, rales, normal symmetric air entry Abdomen - soft, non-tender; bowel sounds normal; no masses,  no organomegaly Extremities - no joint deformities, effusion, or inflammation and no edema  Neurologic Examination: Mental Status: Alert, oriented, thought content appropriate.  Speech fluent without evidence of aphasia. Able to follow commands without difficulty. Cranial Nerves: II-Visual fields were normal. III/IV/VI-Pupils were equal and reacted normally to light. Extraocular movements were full and conjugate.    V/VII-reduced perception of tactile sensation over the right side of the face compared to left; no facial weakness. VIII-normal. X-normal speech and symmetrical palatal movement. XI: trapezius strength/neck flexion strength normal bilaterally XII-midline tongue extension with normal strength. Motor: Slight drift of right upper and lower extremities; otherwise no motor abnormalities. Sensory: Normal throughout over extremities. Deep Tendon Reflexes: 2+ and symmetric. Plantars: Mute bilaterally Cerebellar: Normal finger-to-nose testing. Carotid auscultation: Normal  Results for orders placed or performed during the hospital encounter of 08/18/14 (from the past 48 hour(s))  Troponin I     Status: None   Collection Time: 08/18/14 10:08 AM  Result Value Ref Range   Troponin I <0.03 <0.031 ng/mL    Comment:        NO INDICATION OF MYOCARDIAL INJURY.   CBC with Differential     Status: None   Collection Time: 08/18/14 10:08 AM  Result Value Ref Range   WBC 8.1 4.0 - 10.5 K/uL   RBC 5.05 3.87 - 5.11 MIL/uL   Hemoglobin 14.1 12.0 - 15.0 g/dL   HCT 41.9 36.0 - 46.0 %   MCV 83.0 78.0 - 100.0 fL   MCH 27.9 26.0 - 34.0 pg   MCHC 33.7 30.0 - 36.0 g/dL   RDW 13.0 11.5 - 15.5 %   Platelets 315 150 - 400 K/uL   Neutrophils Relative % 68 43 - 77 %   Neutro Abs 5.4 1.7 - 7.7 K/uL    Lymphocytes Relative 25 12 - 46 %   Lymphs Abs 2.0 0.7 - 4.0 K/uL   Monocytes Relative 6 3 - 12 %   Monocytes Absolute 0.5 0.1 - 1.0 K/uL   Eosinophils Relative 1 0 - 5 %   Eosinophils Absolute 0.1 0.0 - 0.7 K/uL   Basophils Relative 0 0 - 1 %   Basophils Absolute 0.0 0.0 - 0.1 K/uL  Comprehensive metabolic panel     Status: Abnormal   Collection Time: 08/18/14 10:08 AM  Result Value Ref Range   Sodium 133 (L) 135 - 145 mmol/L   Potassium 3.8 3.5 - 5.1 mmol/L   Chloride 98 (L) 101 - 111 mmol/L   CO2 25 22 - 32 mmol/L   Glucose, Bld 375 (H) 65 - 99 mg/dL   BUN 13 6 - 20 mg/dL   Creatinine, Ser 0.90 0.44 - 1.00 mg/dL   Calcium 9.3 8.9 - 10.3 mg/dL   Total Protein 7.0 6.5 - 8.1 g/dL   Albumin 3.6 3.5 - 5.0 g/dL   AST 14 (L) 15 - 41 U/L   ALT 14 14 - 54 U/L   Alkaline Phosphatase 98 38 - 126 U/L   Total Bilirubin 0.5 0.3 - 1.2 mg/dL   GFR calc non Af Amer >60 >60 mL/min   GFR calc Af Amer >60 >60 mL/min    Comment: (NOTE)  The eGFR has been calculated using the CKD EPI equation. This calculation has not been validated in all clinical situations. eGFR's persistently <60 mL/min signify possible Chronic Kidney Disease.    Anion gap 10 5 - 15  Lipase, blood     Status: None   Collection Time: 08/18/14 10:08 AM  Result Value Ref Range   Lipase 28 22 - 51 U/L  CBG monitoring, ED     Status: Abnormal   Collection Time: 08/18/14 10:26 AM  Result Value Ref Range   Glucose-Capillary 327 (H) 65 - 99 mg/dL  Urinalysis, Routine w reflex microscopic (not at [facility])     Status: Abnormal   Collection Time: 08/18/14 12:35 PM  Result Value Ref Range   Color, Urine YELLOW YELLOW   APPearance CLEAR CLEAR   Specific Gravity, Urine 1.039 (H) 1.005 - 1.030   pH 6.0 5.0 - 8.0   Glucose, UA >1000 (A) NEGATIVE mg/dL   Hgb urine dipstick NEGATIVE NEGATIVE   Bilirubin Urine NEGATIVE NEGATIVE   Ketones, ur 15 (A) NEGATIVE mg/dL   Protein, ur NEGATIVE NEGATIVE mg/dL   Urobilinogen, UA 0.2 0.0 -  1.0 mg/dL   Nitrite NEGATIVE NEGATIVE   Leukocytes, UA NEGATIVE NEGATIVE  Pregnancy, urine     Status: None   Collection Time: 08/18/14 12:35 PM  Result Value Ref Range   Preg Test, Ur NEGATIVE NEGATIVE    Comment:        THE SENSITIVITY OF THIS METHODOLOGY IS >20 mIU/mL.   Urine microscopic-add on     Status: Abnormal   Collection Time: 08/18/14 12:35 PM  Result Value Ref Range   Squamous Epithelial / LPF FEW (A) RARE   WBC, UA 0-2 <3 WBC/hpf   Bacteria, UA RARE RARE   Mr Brain Wo Contrast (neuro Protocol)  08/18/2014   CLINICAL DATA:  RIGHT-sided facial numbness and leaning to the RIGHT when walking. Symptoms started 4 days ago and have been getting worse. Stroke risk factors include diabetes mellitus, hypertension, history of stroke, and hyperlipidemia.  EXAM: MRI HEAD WITHOUT CONTRAST  TECHNIQUE: Multiplanar, multiecho pulse sequences of the brain and surrounding structures were obtained without intravenous contrast.  COMPARISON:  None.  FINDINGS: There is restricted diffusion involving the RIGHT lateral medulla and RIGHT inferior cerebellar peduncle as seen on DWI image 7 series 4 and image 13 series 5. Findings are consistent with an acute RIGHT posterior inferior cerebellar artery territory infarct. No visible hemorrhage, mass lesion, hydrocephalus, or extra-axial fluid.  Normal for age cerebral volume. Remote cerebral infarct affects the RIGHT frontal opercular cortex and subcortical white matter. Small subcortical RIGHT parietal lacunar infarct also chronic. No significant white matter disease.  There is diminished or absent flow void in the distal RIGHT vertebral artery which could indicate stenosis or recent occlusion. The LEFT vertebral is patent as is the basilar and both carotids. Difficult to confirm or exclude RIGHT PICA patency on this routine brain MR. MRA intracranial could provide additional information.  Partial empty sella. No tonsillar herniation. Upper cervical region and  extracranial soft tissues unremarkable. Suspected intraparotid lymph node on the LEFT is incompletely evaluated.  IMPRESSION: Acute subcentimeter infarct of the RIGHT lateral medulla and RIGHT inferior cerebellar peduncle, within the distribution of the RIGHT posterior-inferior cerebellar artery.  Diminished or absent flow void in the distal RIGHT vertebral artery could represent stenosis or occlusion.  Areas of chronic infarction in the RIGHT hemisphere affecting the frontal and parietal lobes as described. No foci of acute or  chronic hemorrhage.   Electronically Signed   By: Staci Righter M.D.   On: 08/18/2014 12:20   Dg Abd Acute W/chest  08/18/2014   CLINICAL DATA:  Right facial numbness and leaning to the right while walking today. History of prior stroke. Initial encounter.  EXAM: DG ABDOMEN ACUTE W/ 1V CHEST  COMPARISON:  None.  FINDINGS: Single view of the chest demonstrates clear lungs and normal heart size. No pneumothorax or pleural effusion.  Two views of the abdomen show no free intraperitoneal air. The bowel gas pattern is normal. No unexpected abdominal calcification is identified.  IMPRESSION: Negative exam.   Electronically Signed   By: Inge Rise M.D.   On: 08/18/2014 12:44    Assessment: 49 y.o. female with multiple risk factors for stroke as well as previous history of cerebral infarctions, presenting with acute right brainstem and cerebellar peduncle infarction as well as possible occlusion of right vertebral artery.  Stroke Risk Factors - diabetes mellitus, family history, hyperlipidemia and hypertension  Plan: 1. HgbA1c, fasting lipid panel 2. MRI, MRA  of the brain without contrast 3. PT consult, OT consult, Speech consult 4. Echocardiogram 5. Carotid dopplers 6. Prophylactic therapy-Antiplatelet med: Aspirin  7. Hypercoagulation panel 8. Telemetry monitoring  C.R. Nicole Kindred, MD [facility] [phone]  08/18/2014, 1:40 PM

## 2014-08-18 NOTE — ED Notes (Signed)
Pt reports to the ED for eval of right sided facial numbness and leaning to the right side while she is walking. She reports these symptoms started on Monday but have been getting worse ever since. She has a hx of 2 CVAs. She reports she has residual ataxia but it has gotten worse since Monday. She reports that on 7/3 she was out on a boat and she got severe motion sickness with associated N/V and diaphoresis. Reports she has not felt well since then. She is also a Type II diabetic and has been unable to check her sugar because she ran out of her Medicaid. Denies any HA or CP at this time. Pt A&Ox4, resp e/u, and skin warm and dry.

## 2014-08-18 NOTE — H&P (Signed)
[facility] History and Physical                                                                                    Michelle Brooks, is a 49 y.o. female  MRN: [id number]   DOB - April 04, 1965  Admit Date - 08/18/2014  Outpatient Primary MD for the patient is Philis Fendt, MD  Referring MD: Thurnell Garbe / ER  Consulting M.D.: Nicole Kindred / Neurology  With History of -  Past Medical History  Diagnosis Date  . Diabetes mellitus type 2 in obese   . Hypertension   . History of stroke 03/2010    Right MCA stroke in 03/2010, with MRI also noting subacute strokes in left fronto parietal area - occured in [place] received care at [facility] thought due to uncontrolled blood pressure // No residual deficits // TTE (03/2010) at [facility] - normal LV systolic function, moderate pericardial effusion with 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  [name] Hyperlipidemia LDL goal < 100 04/01/2011  . Chronic back pain   . Headache   . Peripheral neuropathy       Past Surgical History  Procedure Laterality Date  . Breast lumpectomy  2002    states benign lymph node removed.    in for   Chief Complaint  Patient presents with  . Numbness  . Nausea     HPI This is a 49 year old female patient with past history of diabetes on insulin metformin and Januvia, hypertension, prior right MCA stroke in 2012, dyslipidemia, chronic low back pain. Patient presented to the hospital today with progressive neurological symptoms. Patient reports that on 7/3 she had low-grade headaches and took aspirin without any relief. She also felt like she was having difficulty eating. No apparent choking or inability to manage saliva. By 7/4 she noticed right facial numbness but did not notice any drooping in her face. Because she did not see drooping in her face she did not think initially this was stroke symptoms but continue to monitor symptoms as they progress. She continue to  have persistent right facial numbness and today she had right sided numbness with weakness. She reports that at onset of symptoms on 7/4 she did not have any perceived weakness or numbness in the right side but she did notice significant gait ataxia with drifting and sensation of "falling to the right". She denied any symptoms that would be consistent with a hemianopsia. She reported fleeting symptoms of feeling like she could not catch her breath as well as a sharp pain in her anterior chest that radiated to her right arm. Because of these symptoms she presented to the ER for evaluation.  Upon arrival to the ER stroke evaluation was begun. Because her symptoms had begun on 7/4 she was out of the window for any acute thrombolytic therapies. MRI of the brain revealed acute subcentimeter infarct of the right lateral medulla and right inferior cerebellar peduncle within the distribution of the right posterior inferior cerebellar artery. There was also diminished or absent flow in the distal right vertebral artery that could represent stenosis or occlusion. There were also  areas of chronic infarction in the right hemisphere affecting the frontal and parietal lobes as described. She was afebrile and normotensive at presentation. She was not orthostatic. Her room air saturations were 98%. Laboratory data was unremarkable except for sodium of 133 in setting of serum glucose 375, AST was 14. Troponin was normal, CBC was normal with hemoglobin of 14 and normal platelets. Urinalysis was unremarkable except for glycosuria with glucose greater than 1000, ketones 15, urine specific gravity demonstrated concentrated urine at 1.039. Serum pregnancy was negative. Acute abdominal series revealed no acute processes other than the chest or abdomen.   Review of Systems   In addition to the HPI above,  No Fever-chills, myalgias or other constitutional symptoms, No indigestion/reflux No Chest pain, Cough or Shortness of Breath,  palpitations, orthopnea or DOE No Abdominal pain, N/V; no melena or hematochezia, no dark tarry stools, Bowel movements are regular, No dysuria, hematuria or flank pain No new skin rashes, lesions, masses or bruises, No new joints pains-aches No recent weight gain or loss No polyuria, polydypsia or polyphagia,  *A full 10 point Review of Systems was done, except as stated above, all other Review of Systems were negative.  Social History History  Substance Use Topics  . Smoking status: Former Smoker -- 0.20 packs/day for 7 years    Types: Cigarettes    Quit date: 03/26/2004  . Smokeless tobacco: Never Used  . Alcohol Use: Yes     Comment: social rarely    Resides at: Private residence  Lives with: Has significant other  Ambulatory status: Without assistive devices but had developed ataxia with onset of current symptoms   Family History Family History  Problem Relation Age of Onset  . Diabetes Mother   . Stroke Mother 32  . Diabetes Brother   . Diabetes Brother   . Cancer Brother 31    unknown type  . Hypertension Brother      Prior to Admission medications   Medication Sig Start Date End Date Taking? Authorizing Provider  aspirin 325 MG tablet Take 325 mg by mouth daily.   Yes Historical Provider, MD  insulin lispro protamine-lispro (HUMALOG 50/50) (50-50) 100 UNIT/ML SUSP injection Inject 80 Units into the skin 3 (three) times daily. Up to 60 depending on cbg   Yes Historical Provider, MD  lisinopril-hydrochlorothiazide (PRINZIDE) 10-12.5 MG per tablet Take 1 tablet by mouth daily. 10/25/12 08/18/14 Yes Ejiroghene Arlyce Dice, MD  metFORMIN (GLUCOPHAGE) 500 MG tablet Take 1 tablet (500 mg total) by mouth daily with breakfast. Patient taking differently: Take 500 mg by mouth 2 (two) times daily with a meal.  04/13/12 08/18/14 Yes Maitri S Kalia-Reynolds, DO  pravastatin (PRAVACHOL) 40 MG tablet Take 1 tablet (40 mg total) by mouth daily. 05/14/12  Yes Maitri S Kalia-Reynolds, DO    sitaGLIPtin (JANUVIA) 100 MG tablet Take 100 mg by mouth daily.   Yes Historical Provider, MD  aspirin EC 325 MG tablet Take 1 tablet (325 mg total) by mouth daily. 10/23/11 11/12/12  Maitri S Kalia-Reynolds, DO  Blood Glucose Monitoring Suppl (ACCU-CHEK AVIVA PLUS) W/DEVICE KIT 1 kit by Does not apply route 3 (three) times daily before meals. Patient not taking: Reported on 08/18/2014 06/24/12   Maitri S Kalia-Reynolds, DO  gabapentin (NEURONTIN) 300 MG capsule Take 300 mg by mouth 3 (three) times daily.    Historical Provider, MD  glucose blood (ACCU-CHEK AVIVA) test strip Use as instructed Patient not taking: Reported on 08/18/2014 06/24/12   Annamarie Dawley,  DO  Multiple Vitamins-Minerals (MULTIVITAMIN WITH MINERALS) tablet Take 1 tablet by mouth daily.    Historical Provider, MD  venlafaxine (EFFEXOR) 37.5 MG tablet Take 37.5 mg by mouth daily.    Historical Provider, MD    No Known Allergies  Physical Exam  Vitals  Blood pressure 145/111, pulse 90, temperature 97.9 F (36.6 C), resp. rate 20, last menstrual period 07/25/2014, SpO2 99 %.   General:  In no acute distress, appears healthy and well nourished  Psych:  Normal affect, Denies Suicidal or Homicidal ideations, Awake Alert, Oriented X 3. Speech and thought patterns are clear and appropriate, no apparent short term memory deficits  Neuro:  CN II through XII intact except for subtle right-sided facial drooping and slight difficulty squeezing right eyelid shut, Strength 5/5 all 4 extremities except weakness right upper and lower extremities both at 4/5, able to lift all extremities without weakness tremulousness or drift, Sensation intact all 4 extremities.  ENT:  Ears and Eyes appear Normal, Conjunctivae clear, PER. Moist oral mucosa without erythema or exudates.  Neck:  Supple, No lymphadenopathy appreciated  Respiratory:  Symmetrical chest wall movement, Good air movement bilaterally, CTAB. Room Air  Cardiac:  RRR, No  Murmurs, no LE edema noted, no JVD, No carotid bruits, peripheral pulses palpable at 2+  Abdomen:  Positive bowel sounds, Soft, Non tender, Non distended,  No masses appreciated, no obvious hepatosplenomegaly  Skin:  No Cyanosis, Normal Skin Turgor, No Skin Rash or Bruise.  Extremities: Symmetrical without obvious trauma or injury,  no effusions.  Data Review  CBC  Recent Labs Lab 08/18/14 1008  WBC 8.1  HGB 14.1  HCT 41.9  PLT 315  MCV 83.0  MCH 27.9  MCHC 33.7  RDW 13.0  LYMPHSABS 2.0  MONOABS 0.5  EOSABS 0.1  BASOSABS 0.0    Chemistries   Recent Labs Lab 08/18/14 1008  NA 133*  K 3.8  CL 98*  CO2 25  GLUCOSE 375*  BUN 13  CREATININE 0.90  CALCIUM 9.3  AST 14*  ALT 14  ALKPHOS 98  BILITOT 0.5    CrCl cannot be calculated (Unknown ideal weight.).  No results for input(s): TSH, T4TOTAL, T3FREE, THYROIDAB in the last 72 hours.  Invalid input(s): FREET3  Coagulation profile No results for input(s): INR, PROTIME in the last 168 hours.  No results for input(s): DDIMER in the last 72 hours.  Cardiac Enzymes  Recent Labs Lab 08/18/14 1008  TROPONINI <0.03    Invalid input(s): POCBNP  Urinalysis    Component Value Date/Time   COLORURINE YELLOW 08/18/2014 [place] 08/18/2014 1235   LABSPEC 1.039* 08/18/2014 1235   PHURINE 6.0 08/18/2014 1235   GLUCOSEU >1000* 08/18/2014 1235   HGBUR NEGATIVE 08/18/2014 1235   BILIRUBINUR NEGATIVE 08/18/2014 1235   KETONESUR 15* 08/18/2014 1235   PROTEINUR NEGATIVE 08/18/2014 1235   UROBILINOGEN 0.2 08/18/2014 1235   NITRITE NEGATIVE 08/18/2014 1235   LEUKOCYTESUR NEGATIVE 08/18/2014 1235    Imaging results:   Mr Brain Wo Contrast (neuro Protocol)  08/18/2014   CLINICAL DATA:  RIGHT-sided facial numbness and leaning to the RIGHT when walking. Symptoms started 4 days ago and have been getting worse. Stroke risk factors include diabetes mellitus, hypertension, history of stroke, and  hyperlipidemia.  EXAM: MRI HEAD WITHOUT CONTRAST  TECHNIQUE: Multiplanar, multiecho pulse sequences of the brain and surrounding structures were obtained without intravenous contrast.  COMPARISON:  None.  FINDINGS: There is restricted diffusion involving the RIGHT lateral  medulla and RIGHT inferior cerebellar peduncle as seen on DWI image 7 series 4 and image 13 series 5. Findings are consistent with an acute RIGHT posterior inferior cerebellar artery territory infarct. No visible hemorrhage, mass lesion, hydrocephalus, or extra-axial fluid.  Normal for age cerebral volume. Remote cerebral infarct affects the RIGHT frontal opercular cortex and subcortical white matter. Small subcortical RIGHT parietal lacunar infarct also chronic. No significant white matter disease.  There is diminished or absent flow void in the distal RIGHT vertebral artery which could indicate stenosis or recent occlusion. The LEFT vertebral is patent as is the basilar and both carotids. Difficult to confirm or exclude RIGHT PICA patency on this routine brain MR. MRA intracranial could provide additional information.  Partial empty sella. No tonsillar herniation. Upper cervical region and extracranial soft tissues unremarkable. Suspected intraparotid lymph node on the LEFT is incompletely evaluated.  IMPRESSION: Acute subcentimeter infarct of the RIGHT lateral medulla and RIGHT inferior cerebellar peduncle, within the distribution of the RIGHT posterior-inferior cerebellar artery.  Diminished or absent flow void in the distal RIGHT vertebral artery could represent stenosis or occlusion.  Areas of chronic infarction in the RIGHT hemisphere affecting the frontal and parietal lobes as described. No foci of acute or chronic hemorrhage.   Electronically Signed   By: Staci Righter M.D.   On: 08/18/2014 12:20   Dg Abd Acute W/chest  08/18/2014   CLINICAL DATA:  Right facial numbness and leaning to the right while walking today. History of prior  stroke. Initial encounter.  EXAM: DG ABDOMEN ACUTE W/ 1V CHEST  COMPARISON:  None.  FINDINGS: Single view of the chest demonstrates clear lungs and normal heart size. No pneumothorax or pleural effusion.  Two views of the abdomen show no free intraperitoneal air. The bowel gas pattern is normal. No unexpected abdominal calcification is identified.  IMPRESSION: Negative exam.   Electronically Signed   By: Inge Rise M.D.   On: 08/18/2014 12:44     EKG: (Independently reviewed) sinus rhythm with slightly prolonged QTC of 502 ms, no ST segment or T-wave changes that would be concerning for ischemia   Assessment & Plan  Principal Problem:   Stroke/history of CVA -Admit to telemetry -Appreciate neurology service assistance -Check hemoglobin A1c and lipid panel -Continue statin and aspirin -Check echocardiogram -Check carotid Dopplers -PT/OT/SLP evaluations -Has passed bedside swallowing evaluation -No evidence of acute hemorrhagic stroke on MRI is appropriate to utilize pharmacological DVT prophylaxis  Active Problems:   Diabetes mellitus type 2, uncontrolled, with complications -Current CBGs greater than 300 -Check CBGs AC/HS -Was on Humulin 50/50 insulin 80 units 3 times a day at home- will utilize Lantus 25 units at hour of sleep while hospitalized -Patient was on Januvia prior to admission with therapeutic substitution of Tradjenta this admission -Hold metformin -Check hemoglobin A1c; previously documented patient was significantly poorly controlled diabetes with last documented hemoglobin A1c 13.2 in 2014 -Short-term gentle IV fluid hydration and setting of recent hyperglycemia and endorsed poor oral intake prior to admission    Hypertension -Current blood pressure controlled -Continue lisinopril but hold thiazide diuretics in setting of mild to moderate volume depletion in setting of hyperglycemia    Dyslipidemia -Continue preadmission Pravachol    Chronic low back  pain -Patient also reports history of neuropathy that has contributed to her back pain as well -Had been on Neurontin but this caused a skin eruption/rash so this was discontinued    DVT Prophylaxis: Lovenox  Family Communication:  No family  at bedside  Code Status:  Full code  Condition:  Stable  Discharge disposition: Pending evaluation by PT/OT patient will either discharged to home but more likely given gait ataxia and extremity deficits will likely require rehabilitative therapy prior to returning to home       ELLIS,ALLISON L. ANP on 08/18/2014 at 3:05 PM  Between 7am to 7pm - Pager - [phone]  After 7pm go to [email or web address] - password [email or web address]  And look for the night coverage person covering me after hours  [facility]  Patient was seen, examined,treatment plan was discussed with the Advance Practice Provider.  I have directly reviewed the clinical findings, lab, imaging studies and management of this patient in detail. I have made the necessary changes to the above noted documentation, and agree with the documentation, as recorded by the Advance Practice Provider.     Marzetta Board, MD [facility] [phone]

## 2014-08-18 NOTE — ED Notes (Signed)
Pt says she felt "very light headed" while standing

## 2014-08-18 NOTE — Progress Notes (Signed)
Pt arrived at 4N12 from ED. Pt alert and oriented. C/O of no pain. C/O of numbness on rt face. Safety instructions provided with verbalize understanding. Bed in lowest position. Call light within reach. Will continue to monitor.  Shella SpearingKahin, Charity fundraiserN.

## 2014-08-19 ENCOUNTER — Inpatient Hospital Stay (HOSPITAL_COMMUNITY): Payer: Medicaid Other

## 2014-08-19 DIAGNOSIS — M545 Low back pain: Secondary | ICD-10-CM

## 2014-08-19 DIAGNOSIS — G8929 Other chronic pain: Secondary | ICD-10-CM

## 2014-08-19 DIAGNOSIS — E118 Type 2 diabetes mellitus with unspecified complications: Secondary | ICD-10-CM

## 2014-08-19 DIAGNOSIS — Z8673 Personal history of transient ischemic attack (TIA), and cerebral infarction without residual deficits: Secondary | ICD-10-CM

## 2014-08-19 LAB — GLUCOSE, CAPILLARY
GLUCOSE-CAPILLARY: 247 mg/dL — AB (ref 65–99)
Glucose-Capillary: 251 mg/dL — ABNORMAL HIGH (ref 65–99)
Glucose-Capillary: 256 mg/dL — ABNORMAL HIGH (ref 65–99)
Glucose-Capillary: 222 mg/dL — ABNORMAL HIGH (ref 65–99)

## 2014-08-19 LAB — LIPID PANEL
CHOL/HDL RATIO: 6.7 ratio
Cholesterol: 174 mg/dL (ref 0–200)
HDL: 26 mg/dL — AB (ref >40)
LDL CALC: 78 mg/dL (ref 0–99)
Triglycerides: 348 mg/dL — ABNORMAL HIGH (ref <150)
VLDL: 70 mg/dL — ABNORMAL HIGH (ref 0–40)

## 2014-08-19 MED ORDER — CLOPIDOGREL BISULFATE 75 MG PO TABS
75.0000 mg | ORAL_TABLET | Freq: Every day | ORAL | Status: DC
  Administered 2014-08-20 – 2014-08-22 (×3): 75 mg via ORAL
  Filled 2014-08-19 (×4): qty 1

## 2014-08-19 MED ORDER — CLOPIDOGREL BISULFATE 75 MG PO TABS
300.0000 mg | ORAL_TABLET | Freq: Once | ORAL | Status: AC
  Administered 2014-08-19: 300 mg via ORAL
  Filled 2014-08-19: qty 4

## 2014-08-19 MED ORDER — IOHEXOL 350 MG/ML SOLN
50.0000 mL | Freq: Once | INTRAVENOUS | Status: AC | PRN
  Administered 2014-08-19: 50 mL via INTRAVENOUS

## 2014-08-19 MED ORDER — INSULIN GLARGINE 100 UNIT/ML ~~LOC~~ SOLN
40.0000 [IU] | Freq: Every day | SUBCUTANEOUS | Status: DC
  Administered 2014-08-19: 40 [IU] via SUBCUTANEOUS
  Filled 2014-08-19 (×2): qty 0.4

## 2014-08-19 MED ORDER — ASPIRIN EC 81 MG PO TBEC
81.0000 mg | DELAYED_RELEASE_TABLET | Freq: Every day | ORAL | Status: DC
  Administered 2014-08-19 – 2014-08-22 (×4): 81 mg via ORAL
  Filled 2014-08-19 (×4): qty 1

## 2014-08-19 MED ORDER — INSULIN ASPART 100 UNIT/ML ~~LOC~~ SOLN
6.0000 [IU] | Freq: Three times a day (TID) | SUBCUTANEOUS | Status: DC
  Administered 2014-08-19 – 2014-08-20 (×3): 6 [IU] via SUBCUTANEOUS

## 2014-08-19 NOTE — Progress Notes (Signed)
STROKE TEAM PROGRESS NOTE   HISTORY Michelle Brooks is a 49 y.o. female history diabetes mellitus, hypertension, cerebral infarction, hyperlipidemia and peripheral neuropathy, presenting with right facial numbness and weakness on her right side with tendency to stumble towards the right when she walks. Symptoms began on 08/14/2014 area she's been taking aspirin daily. MRI of the brain showed small acute stroke involving right lateral range stem and cerebellum peduncle. There was also diminished to absent flow in the right vertebral artery. Areas of chronic infarction were noted in the right frontal and parietal regions. NIH stroke score was 3.  LSN: 08/14/2014 tPA Given: No: Beyond time window for treatment consideration mRankin:    SUBJECTIVE (INTERVAL HISTORY) The patient's husband is at the bedside. The patient reports he still having facial numbness. She states that she has had 2 previous strokes as well as a family history of strokes. She is in the process of applying for disability. At this time she is having difficulty making ends meet and is not been able to afford the supplies required for home glucose monitoring. She has no insurance. Case manager / Child psychotherapist to evaluate.  Dr. Roda Shutters instructed her to avoid hypotension due to diffuse cerebrovascular disease. Systolic blood pressure of 120 to 150 recommended.   OBJECTIVE Temp:  [97.8 F (36.6 C)-98.5 F (36.9 C)] 98.4 F (36.9 C) (07/09 0649) Pulse Rate:  [74-93] 87 (07/09 0649) Cardiac Rhythm:  [-] Normal sinus rhythm (07/08 2000) Resp:  [11-21] 20 (07/09 0649) BP: (122-174)/(70-111) 147/89 mmHg (07/09 0649) SpO2:  [89 %-100 %] 97 % (07/09 0649)   Recent Labs Lab 08/18/14 1026 08/18/14 1706 08/18/14 2202 08/19/14 0646  GLUCAP 327* 417* 286* 251*    Recent Labs Lab 08/18/14 1008  NA 133*  K 3.8  CL 98*  CO2 25  GLUCOSE 375*  BUN 13  CREATININE 0.90  CALCIUM 9.3    Recent Labs Lab 08/18/14 1008  AST 14*   ALT 14  ALKPHOS 98  BILITOT 0.5  PROT 7.0  ALBUMIN 3.6    Recent Labs Lab 08/18/14 1008  WBC 8.1  NEUTROABS 5.4  HGB 14.1  HCT 41.9  MCV 83.0  PLT 315    Recent Labs Lab 08/18/14 1008  TROPONINI <0.03   No results for input(s): LABPROT, INR in the last 72 hours.  Recent Labs  08/18/14 1235  COLORURINE YELLOW  LABSPEC 1.039*  PHURINE 6.0  GLUCOSEU >1000*  HGBUR NEGATIVE  BILIRUBINUR NEGATIVE  KETONESUR 15*  PROTEINUR NEGATIVE  UROBILINOGEN 0.2  NITRITE NEGATIVE  LEUKOCYTESUR NEGATIVE       Component Value Date/Time   CHOL 174 08/19/2014 0500   TRIG 348* 08/19/2014 0500   HDL 26* 08/19/2014 0500   CHOLHDL 6.7 08/19/2014 0500   VLDL 70* 08/19/2014 0500   LDLCALC 78 08/19/2014 0500   Lab Results  Component Value Date   HGBA1C 13.2 05/13/2012   No results found for: LABOPIA, COCAINSCRNUR, LABBENZ, AMPHETMU, THCU, LABBARB  No results for input(s): ETH in the last 168 hours.     Imaging  Mr Shirlee Latch Wo Contrast 08/19/2014    RIGHT distal vertebral artery occlusion, distal to the posterior-inferior cerebellar artery origin. Moderate irregularity of the proximal RIGHT V4 segment likely representing atherosclerosis.   RIGHT M1 occlusion.   LEFT A1 occlusion, patent anterior cerebral arteries via anterior communicating artery.   High-grade stenosis LEFT P1 with patent posterior communicating arteries bilaterally.    Mr Brain Wo Contrast (neuro Protocol) 08/18/2014  Acute subcentimeter infarct of the RIGHT lateral medulla and RIGHT inferior cerebellar peduncle, within the distribution of the RIGHT posterior-inferior cerebellar artery.   Diminished or absent flow void in the distal RIGHT vertebral artery could represent stenosis or occlusion.  Areas of chronic infarction in the RIGHT hemisphere affecting the frontal and parietal lobes as described. No foci of acute or chronic hemorrhage.    CTA of the head and Neck 08/19/2014  Carotid and vertebral  arteries are patent in the neck withoutsignificant stenosis  Occluded right M1 segment as noted on recent MRA.  Right vertebral artery ends in PICA which is a normal variant. Notes made of acute infarct in the right medulla in the right and PICA territory.  Thyroid nodule. Recommend thyroid ultrasound  Dg Abd Acute W/chest 08/18/2014    Negative exam.     2D echo - - Left ventricle: The cavity size was normal. There was mild concentric hypertrophy. Systolic function was normal. Wall motion was normal; there were no regional wall motion abnormalities. Doppler parameters are consistent with abnormal left ventricular relaxation (grade 1 diastolic dysfunction). Impressions: - No cardiac source of emboli was indentified.  PHYSICAL EXAM  Temp:  [97.8 F (36.6 C)-98.5 F (36.9 C)] 98.1 F (36.7 C) (07/09 1321) Pulse Rate:  [84-89] 89 (07/09 1321) Resp:  [16-20] 16 (07/09 1321) BP: (138-174)/(77-97) 148/89 mmHg (07/09 1321) SpO2:  [94 %-100 %] 94 % (07/09 1321)  General - Well nourished, well developed, in no apparent distress.  Ophthalmologic - Fundi not visualized due to photophobia.  Cardiovascular - Regular rate and rhythm with no murmur.  Mental Status -  Level of arousal and orientation to time, place, and person were intact. Language including expression, naming, repetition, comprehension was assessed and found intact. Fund of Knowledge was assessed and was intact.  Cranial Nerves II - XII - II - Visual field intact OU. III, IV, VI - Extraocular movements intact. V - Facial sensation decreased on the right. VII - Facial movement intact bilaterally. VIII - Hearing & vestibular intact bilaterally. X - Palate elevates symmetrically. XI - Chin turning & shoulder shrug intact bilaterally. XII - Tongue protrusion intact.  Motor Strength - The patient's strength was normal in all extremities and pronator drift was absent.  Bulk was normal and fasciculations were  absent.   Motor Tone - Muscle tone was assessed at the neck and appendages and was normal.  Reflexes - The patient's reflexes were 1+ in all extremities and she had no pathological reflexes.  Sensory - Light touch, temperature/pinprick were assessed and were symmetrical.    Coordination - The patient had normal movements in the left hand and leg but dysmetria on right FTN and HTS.  Tremor was absent.  Gait and Station - deferred due to safety concerns   ASSESSMENT/PLAN [name] is a [age] y.o. female with history of diabetes mellitus, hypertension, previous strokes, hyperlipidemia, and neuropathy presenting with right facial numbness, right hemiparesis, and unsteady gait . She did not receive IV t-PA due to late presentation.  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 Risk factor including uncontrolled diabetes and hypertension, hyperlipidemia with obesity.  Resultant right hemiparesis (typical Wallenberg presentation)  MRI - acute subcentimeter infarct of the RIGHT lateral medulla and RIGHT inferior cerebellar peduncle.  MRA - R. distal vertebral artery occlusion, R. M1 occlusion, High-grade stenosis L.  P1  CTA head and neck - right M1 occlusion and right VA ends in PICA  2D Echo unremarkable  LDL 78  HgbA1c pending  Lovenox for VTE prophylaxis  Diet heart healthy/carb modified Room service appropriate?: Yes; Fluid consistency:: Thin  aspirin 325 mg orally every day prior to admission, now on aspirin 81 mg orally every day and clopidogrel 75 mg orally every day. Continued dural antiplatelet for 3 months and then Plavix alone.  Patient counseled to be compliant with her antithrombotic medications  Ongoing aggressive stroke risk factor management  Therapy recommendations: Pending  Disposition:   Pending  Hypertension  Home meds: Lisinopril with hydrochlorothiazide Permissive hypertension (OK if <220/120) for 24-48 hours post stroke and then gradually normalized within 5-7 days.  Stable  Patient counseled to be compliant with her blood pressure medications  Hyperlipidemia  Home meds:  Pravachol 40 mg daily resumed in hospital  LDL 78, goal < 70  Continue statin at discharge  Diabetes  HgbA1c pending, goal < 7.0  Uncontrolled  On Lantus, Januvia  Other Stroke Risk Factors  Cigarette smoker, quit smoking in 2006   ETOH use  Obesity  Hx stroke/TIA  Family hx stroke (mother)  Other Active Problems  Thyroid nodule needs further evaluation.  Sodium 133  Other Pertinent History  Not checking glucose at home   Need financial support  Hospital day # 1  Delton See PA-C [facility] Pager [phone] 08/19/2014, 12:43 PM  I, the attending vascular neurologist, have personally obtained a history, examined the patient, evaluated laboratory data, individually viewed imaging studies and agree with radiology interpretations. Together with the NP/PA, we formulated the assessment and plan of care which reflects our mutual decision.  I have made any additions or clarifications directly to the above note and agree with the findings and plan as currently documented.   49 year old female with history of right MCA stroke, diabetes, hypertension and hyperlipidemia, on home aspirin was admitted for right lateral medullary infarct, consistent with small vessel disease. MRA and CTA showed right M1 cut off and the right VA occlusion versus ending in PICA. Not able to check glucose at home due to financial difficulty. A1c pending, LDL 78 on pravastatin at home. Recommended dural antiplatelet and continue statin. Aggressive management of risk factors.  Marvel Plan, MD PhD Stroke Neurology 08/19/2014 4:09 PM        To contact Stroke Continuity provider,  please refer to [email or web address]. After hours, contact General Neurology

## 2014-08-19 NOTE — Progress Notes (Signed)
[facility] PROGRESS NOTE   Michelle BoettcherWanda D Brooks ZOX:[id number] DOB: 01/04/1966 DOA: 08/18/2014 PCP: Michelle Brooks,Michelle A, MD  HPI/Subjective: Still has some right facial numbness, think it's better than yesterday. No other complaints, denies any fever or chills.  Assessment/Plan: Principal Problem:   Stroke Active Problems:   Diabetes mellitus type 2, uncontrolled, with complications   Hypertension   History of stroke   Dyslipidemia   Chronic low back pain   CVA (cerebral infarction)    Stroke/history of CVA Patient presented to the hospital with right facial numbness and slurred speech. MRI of the brain showed acute infarct in the right lateral medulla and right inferior cerebellar peduncle. MRA of the brain showed occlusion of right distal vertebral artery, M1 and A1 with high-grade stenosis of P1. Patient is on aspirin, await recommendation from neurology currently on aspirin and Plavix.  Diabetes mellitus type 2, uncontrolled, with complications Was on Humulin 50/50 insulin 80 units 3 times Brooks day at home- will utilize Lantus 25 units at hour of sleep while hospitalized Was on metformin and Januvia as well. I told him we'll switch to Lantus, patient and significant other at bedside very resistant that it did not work before. Check hemoglobin A1c (previous A1c was 13.2 on 4/14), adjust insulin.  Hypertension Current blood pressure controlled Continue lisinopril but hold thiazide diuretics in setting of mild to moderate volume depletion in setting of hyperglycemia  Dyslipidemia Continue preadmission Pravachol  Chronic low back pain Patient also reports history of neuropathy that has contributed to her back pain as well Had been on Neurontin but this caused Brooks skin eruption/rash so this was discontinued   Code Status: Full Code Family Communication: Plan discussed with the patient. Disposition Plan: Remains inpatient Diet: Diet heart healthy/carb modified Room service  appropriate?: Yes; Fluid consistency:: Thin  Consultants:  Neurology  Procedures:  None  Antibiotics:  None   Objective: Filed Vitals:   08/19/14 0649  BP: 147/89  Pulse: 87  Temp: 98.4 F (36.9 C)  Resp: 20   No intake or output data in the 24 hours ending 08/19/14 1029 There were no vitals filed for this visit.  Exam: General: Alert and awake, oriented x3, not in any acute distress. HEENT: anicteric sclera, pupils reactive to light and accommodation, EOMI CVS: S1-S2 clear, no murmur rubs or gallops Chest: clear to auscultation bilaterally, no wheezing, rales or rhonchi Abdomen: soft nontender, nondistended, normal bowel sounds, no organomegaly Extremities: no cyanosis, clubbing or edema noted bilaterally Neuro: Cranial nerves II-XII intact, no focal neurological deficits  Data Reviewed: Basic Metabolic Panel:  Recent Labs Lab 08/18/14 1008  NA 133*  K 3.8  CL 98*  CO2 25  GLUCOSE 375*  BUN 13  CREATININE 0.90  CALCIUM 9.3   Liver Function Tests:  Recent Labs Lab 08/18/14 1008  AST 14*  ALT 14  ALKPHOS 98  BILITOT 0.5  PROT 7.0  ALBUMIN 3.6    Recent Labs Lab 08/18/14 1008  LIPASE 28   No results for input(s): AMMONIA in the last 168 hours. CBC:  Recent Labs Lab 08/18/14 1008  WBC 8.1  NEUTROABS 5.4  HGB 14.1  HCT 41.9  MCV 83.0  PLT 315   Cardiac Enzymes:  Recent Labs Lab 08/18/14 1008  TROPONINI <0.03   BNP (last 3 results) No results for input(s): BNP in the last 8760 hours.  ProBNP (last 3 results) No results for input(s): PROBNP in the last 8760 hours.  CBG:  Recent Labs Lab 08/18/14 1026  08/18/14 1706 08/18/14 2202 08/19/14 0646  GLUCAP 327* 417* 286* 251*    Micro No results found for this or any previous visit (from the past 240 hour(s)).   Studies: Mr Shirlee Latch ZO Contrast  08/19/2014   ADDENDUM REPORT: 08/19/2014 02:21  ADDENDUM: Acute findings discussed with and reconfirmed by Dr.Neil Amada Brooks  on 08/19/2014 at 2:10am.   Electronically Signed   By: Awilda Metro M.D.   On: 08/19/2014 02:21   08/19/2014   CLINICAL DATA:  Followup small posterior circulation infarcts, possible RIGHT vertebral artery stenosis/occlusion. RIGHT facial numbness, RIGHT body numbness and weakness. History of stroke, diabetes, hypertension.  EXAM: MRA HEAD WITHOUT CONTRAST  TECHNIQUE: Angiographic images of the Circle of Willis were obtained using MRA technique without intravenous contrast.  COMPARISON:  MRI of the brain August 18, 2014  FINDINGS: Anterior circulation: Flow related enhancement of the included cervical, petrous, cavernous and supra clinoid internal carotid arteries. Mild luminal regularity of the carotid siphons with at least moderate stenosis RIGHT anterior genu, low likely did atherosclerosis. Patent anterior communicating artery. LEFT A1 segment appears occluded, normal appearance the anterior cerebral arteries. Moderate stenosis LEFT M1 segment with patent though, relatively thready LEFT middle cerebral artery. Occluded RIGHT distal M1 segment. Bilateral posterior communicating arteries are present.  Posterior circulation: LEFT vertebral artery is dominant. The RIGHT vertebral artery is irregular with moderate stenosis, predominately terminating in the posterior inferior cerebellar artery, the distal RIGHT vertebral artery patent basilar artery. High-grade stenosis LEFT P1 segment with flow via the posterior communicating artery. Slower flow in LEFT posterior cerebral artery.  IMPRESSION: RIGHT distal vertebral artery occlusion, distal to the posterior-inferior cerebellar artery origin. Moderate irregularity of the proximal RIGHT V4 segment likely representing atherosclerosis.  RIGHT M1 occlusion.  LEFT A1 occlusion, patent anterior cerebral arteries via anterior communicating artery.  High-grade stenosis LEFT P1 with patent posterior communicating arteries bilaterally.  Electronically Signed: By: Awilda Metro M.D. On: 08/19/2014 02:10   Mr Brain Wo Contrast (neuro Protocol)  08/18/2014   CLINICAL DATA:  RIGHT-sided facial numbness and leaning to the RIGHT when walking. Symptoms started 4 days ago and have been getting worse. Stroke risk factors include diabetes mellitus, hypertension, history of stroke, and hyperlipidemia.  EXAM: MRI HEAD WITHOUT CONTRAST  TECHNIQUE: Multiplanar, multiecho pulse sequences of the brain and surrounding structures were obtained without intravenous contrast.  COMPARISON:  None.  FINDINGS: There is restricted diffusion involving the RIGHT lateral medulla and RIGHT inferior cerebellar peduncle as seen on DWI image 7 series 4 and image 13 series 5. Findings are consistent with an acute RIGHT posterior inferior cerebellar artery territory infarct. No visible hemorrhage, mass lesion, hydrocephalus, or extra-axial fluid.  Normal for age cerebral volume. Remote cerebral infarct affects the RIGHT frontal opercular cortex and subcortical white matter. Small subcortical RIGHT parietal lacunar infarct also chronic. No significant white matter disease.  There is diminished or absent flow void in the distal RIGHT vertebral artery which could indicate stenosis or recent occlusion. The LEFT vertebral is patent as is the basilar and both carotids. Difficult to confirm or exclude RIGHT PICA patency on this routine brain MR. MRA intracranial could provide additional information.  Partial empty sella. No tonsillar herniation. Upper cervical region and extracranial soft tissues unremarkable. Suspected intraparotid lymph node on the LEFT is incompletely evaluated.  IMPRESSION: Acute subcentimeter infarct of the RIGHT lateral medulla and RIGHT inferior cerebellar peduncle, within the distribution of the RIGHT posterior-inferior cerebellar artery.  Diminished or absent flow void  in the distal RIGHT vertebral artery could represent stenosis or occlusion.  Areas of chronic infarction in the RIGHT  hemisphere affecting the frontal and parietal lobes as described. No foci of acute or chronic hemorrhage.   Electronically Signed   By: Elsie Stain M.D.   On: 08/18/2014 12:20   Dg Abd Acute W/chest  08/18/2014   CLINICAL DATA:  Right facial numbness and leaning to the right while walking today. History of prior stroke. Initial encounter.  EXAM: DG ABDOMEN ACUTE W/ 1V CHEST  COMPARISON:  None.  FINDINGS: Single view of the chest demonstrates clear lungs and normal heart size. No pneumothorax or pleural effusion.  Two views of the abdomen show no free intraperitoneal air. The bowel gas pattern is normal. No unexpected abdominal calcification is identified.  IMPRESSION: Negative exam.   Electronically Signed   By: Drusilla Kanner M.D.   On: 08/18/2014 12:44    Scheduled Meds: . sodium chloride   Intravenous STAT  . aspirin EC  81 mg Oral Daily  . [START ON 08/20/2014] clopidogrel  75 mg Oral Daily  . enoxaparin (LOVENOX) injection  40 mg Subcutaneous Q24H  . insulin aspart  0-20 Units Subcutaneous TID WC  . insulin aspart  0-5 Units Subcutaneous QHS  . insulin aspart  3 Units Subcutaneous TID WC  . insulin glargine  25 Units Subcutaneous QHS  . linagliptin  5 mg Oral Daily  . lisinopril  10 mg Oral Daily  . pravastatin  40 mg Oral Daily  . venlafaxine  37.5 mg Oral BID WC   Continuous Infusions: . sodium chloride 50 mL/hr at 08/19/14 0646       Time spent: 35 minutes    [facility]  [facility] Pager [phone] If 7PM-7AM, please contact night-coverage at [email or web address], password [facility] 08/19/2014, 10:29 AM  LOS: 1 day

## 2014-08-19 NOTE — Progress Notes (Signed)
Echocardiogram 2D Echocardiogram has been performed.  Nolon RodBrown, Tony 08/19/2014, 10:06 AM

## 2014-08-19 NOTE — Evaluation (Signed)
Physical Therapy Evaluation Patient Details Name: Michelle Brooks MRN: [id number] DOB: 08-26-1965 Today's Date: 08/19/2014   History of Present Illness  Michelle Brooks is an 49 y.o. female history diabetes mellitus, hypertension, cerebral infarction, hyperlipidemia and peripheral neuropathy, presenting with right facial numbness and weakness on her right side with tendency to stumble towards the right when she walks. MRI of the brain showed acute infarct in the right lateral medulla and right inferior cerebellar peduncle.  Clinical Impression  Pt admitted with the above diagnosis. Pt currently with functional limitations due to the deficits listed below (see PT Problem List). Demonstrates right sided ataxia with gross muscle strength of 4/5 on Rt (5/5 left). Complains of severe dizziness exacerbated by head turns; educated on x1 and x2 viewing exercises. Functionally pt mobilizing much slower than her baseline, per husband. Required min assist intermittently for balance while using a quad cane, and appears very guarded and anxious while ambulating. Good foot placement however Rt ankle appears to have mild instability. Became nauseous while ambulating (-) emesis. Of note pt has some mobility issues at baseline (uses quad cane and husband assists pt out of tub) they report this is due to her history of lower back pain. Pt will benefit from skilled PT to increase their independence and safety with mobility to allow discharge to the venue listed below.       Follow Up Recommendations CIR (Pending progression)    Equipment Recommendations   (TBD)    Recommendations for Other Services Rehab consult;OT consult     Precautions / Restrictions Precautions Precautions: Fall Restrictions Weight Bearing Restrictions: No      Mobility  Bed Mobility Overal bed mobility: Modified Independent             General bed mobility comments: extra time  Transfers Overall transfer level: Needs  assistance Equipment used: None Transfers: Sit to/from Stand Sit to Stand: Min guard         General transfer comment: Min guard for safety. Very gaurded, heavily gripping bed rail initially. VC for hand placement. No buckling of RLE noted.  Ambulation/Gait Ambulation/Gait assistance: Min assist Ambulation Distance (Feet): 45 Feet Assistive device: Quad cane Gait Pattern/deviations: Step-to pattern;Step-through pattern;Decreased step length - left;Decreased stance time - right;Drifts right/left;Wide base of support Gait velocity: slow Gait velocity interpretation: <1.8 ft/sec, indicative of risk for recurrent falls General Gait Details: Gait training with quad cane use in Rt vs Lt hand, appears more stable with quad cane in Lt hand however pt feels more comfortable with cane in Rt hand. VC for sequencing. Min assist intermittently for balance. No buckling noted. Very fearful and anxious. Reports nausea and increased lower back pain. Good foot control without significant ataxia however Rt ankle appears to be slightly unstable. Good heel strike bil. Relies heavily on quad cane. HR elevated to 121 during bout.  Stairs            Wheelchair Mobility    Modified Rankin (Stroke Patients Only) Modified Rankin (Stroke Patients Only) Pre-Morbid Rankin Score: Moderate disability Modified Rankin: Moderately severe disability     Balance                                             Pertinent Vitals/Pain Pain Assessment: No/denies pain    Home Living Family/patient expects to be discharged to:: Private residence Living Arrangements: Spouse/significant other  Available Help at Discharge: Family;Available 24 hours/day Type of Home: Apartment Home Access: Stairs to enter Entrance Stairs-Rails: Left Entrance Stairs-Number of Steps: 15 Home Layout: One level Home Equipment: Cane - quad      Prior Function Level of Independence: Needs assistance   Gait /  Transfers Assistance Needed: uses quad cane for mobility  ADL's / Homemaking Assistance Needed: husband sometimes assists pt with getting out of tub.        Hand Dominance   Dominant Hand: Right    Extremity/Trunk Assessment   Upper Extremity Assessment: Defer to OT evaluation (RUE dysmetria with FNF test)           Lower Extremity Assessment: RLE deficits/detail RLE Deficits / Details: Ataxia with heel-to-shin test. MMT on Rt grossly 4/5 compared to LLE (5/5)       Communication   Communication: No difficulties  Cognition Arousal/Alertness: Awake/alert Behavior During Therapy: WFL for tasks assessed/performed Overall Cognitive Status: Within Functional Limits for tasks assessed                      General Comments General comments (skin integrity, edema, etc.): Discussed d/c planning, safety with mobility and reviewed modifiable risk factors of stroke including FAST acronym for awareness of signs/symptoms of stroke. Additonally pt reporting dizziness with head turns and "feeling like I'm on a ship."  Bil dysmetria with x2 viewing, and increased dizziness with x1 viewing. Reviewed these exercises with patient to perform x30, 3x/day.    Exercises Other Exercises Other Exercises: x1 viewing x30 - x2 viewing x30 - to perform 3x/day (Reports 8/10 max dizziness with x1 viewing)      Assessment/Plan    PT Assessment Patient needs continued PT services  PT Diagnosis Difficulty walking;Abnormality of gait;Acute pain;Hemiplegia dominant side   PT Problem List Decreased strength;Decreased activity tolerance;Decreased balance;Decreased mobility;Decreased coordination;Decreased knowledge of use of DME;Obesity;Pain  PT Treatment Interventions DME instruction;Gait training;Stair training;Functional mobility training;Therapeutic activities;Therapeutic exercise;Balance training;Neuromuscular re-education;Patient/family education   PT Goals (Current goals can be found in the  Care Plan section) Acute Rehab PT Goals Patient Stated Goal: Get better PT Goal Formulation: With patient/family Time For Goal Achievement: 09/02/14 Potential to Achieve Goals: Good Additional Goals Additional Goal #1:  (Pt will report 6/10 max dizziness with x1 viewing)    Frequency Min 4X/week   Barriers to discharge        Co-evaluation               End of Session Equipment Utilized During Treatment: Gait belt Activity Tolerance: Patient tolerated treatment well Patient left: in chair;with call bell/phone within reach;with family/visitor present Nurse Communication: Mobility status         Time: [id number] PT Time Calculation (min) (ACUTE ONLY): 38 min   Charges:   PT Evaluation $Initial PT Evaluation Tier I: 1 Procedure PT Treatments $Gait Training: 8-22 mins $Therapeutic Activity: 8-22 mins   PT G CodesBerton Mount:        Brytney Somes S 08/19/2014, 11:46 AM Charlsie MerlesLogan Secor Dalante Minus, PT [phone]

## 2014-08-20 LAB — BASIC METABOLIC PANEL
Anion gap: 8 (ref 5–15)
BUN: 9 mg/dL (ref 6–20)
CHLORIDE: 102 mmol/L (ref 101–111)
CO2: 23 mmol/L (ref 22–32)
Calcium: 8.6 mg/dL — ABNORMAL LOW (ref 8.9–10.3)
Creatinine, Ser: 0.94 mg/dL (ref 0.44–1.00)
GFR calc non Af Amer: >60 mL/min (ref >60)
Glucose, Bld: 276 mg/dL — ABNORMAL HIGH (ref 65–99)
Potassium: 3.9 mmol/L (ref 3.5–5.1)
Sodium: 133 mmol/L — ABNORMAL LOW (ref 135–145)

## 2014-08-20 LAB — GLUCOSE, CAPILLARY
Glucose-Capillary: 240 mg/dL — ABNORMAL HIGH (ref 65–99)
Glucose-Capillary: 294 mg/dL — ABNORMAL HIGH (ref 65–99)
Glucose-Capillary: 169 mg/dL — ABNORMAL HIGH (ref 65–99)
Glucose-Capillary: 178 mg/dL — ABNORMAL HIGH (ref 65–99)

## 2014-08-20 MED ORDER — INSULIN ASPART PROT & ASPART (70-30 MIX) 100 UNIT/ML ~~LOC~~ SUSP
40.0000 [IU] | Freq: Three times a day (TID) | SUBCUTANEOUS | Status: DC
  Administered 2014-08-20 – 2014-08-21 (×3): 40 [IU] via SUBCUTANEOUS
  Filled 2014-08-20: qty 10

## 2014-08-20 MED ORDER — HYDROCHLOROTHIAZIDE 12.5 MG PO CAPS
12.5000 mg | ORAL_CAPSULE | Freq: Every day | ORAL | Status: DC
  Administered 2014-08-20 – 2014-08-22 (×3): 12.5 mg via ORAL
  Filled 2014-08-20 (×3): qty 1

## 2014-08-20 MED ORDER — INSULIN ASPART 100 UNIT/ML ~~LOC~~ SOLN
0.0000 [IU] | Freq: Three times a day (TID) | SUBCUTANEOUS | Status: DC
  Administered 2014-08-21: 4 [IU] via SUBCUTANEOUS
  Administered 2014-08-21 (×2): 7 [IU] via SUBCUTANEOUS
  Administered 2014-08-22: 4 [IU] via SUBCUTANEOUS
  Administered 2014-08-22: 11 [IU] via SUBCUTANEOUS

## 2014-08-20 MED ORDER — INSULIN ASPART 100 UNIT/ML ~~LOC~~ SOLN
0.0000 [IU] | Freq: Every day | SUBCUTANEOUS | Status: DC
  Administered 2014-08-20: 3 [IU] via SUBCUTANEOUS

## 2014-08-20 NOTE — Progress Notes (Signed)
[facility] PROGRESS NOTE   Michelle Brooks WUJ:[id number] DOB: 10-Mar-1965 DOA: 08/18/2014 PCP: Dorrene German, MD  HPI/Subjective: Patient is not happy because of her blood pressure is elevated, telling me his Lantus did not work and her before. Blood pressure slightly elevated, will restart home medications.  Assessment/Plan: Principal Problem:   Stroke Active Problems:   Diabetes mellitus type 2, uncontrolled, with complications   Hypertension   History of stroke   Dyslipidemia   Chronic low back pain   CVA (cerebral infarction)    Stroke/history of CVA Patient presented to the hospital with right facial numbness and slurred speech. MRI of the brain showed acute infarct in the right lateral medulla and right inferior cerebellar peduncle. MRA of the brain showed occlusion of right distal vertebral artery, M1 and A1 with high-grade stenosis of P1. Patient is on aspirin, await recommendation from neurology currently on aspirin and Plavix.  Diabetes mellitus type 2, uncontrolled, with complications Was on Humulin 50/50 insulin 80 units 3 times a day at home- will utilize Lantus 25 units at hour of sleep while hospitalized Was on metformin and Januvia as well. I told him we'll switch to Lantus, patient and significant other at bedside very resistant that it did not work before. Check hemoglobin A1c (previous A1c was 13.2 on 4/14), still pending. Reviewed the previous records and showed patient was on up to 90 units Brooks of Lantus.  Hypertension Current blood pressure controlled Continue lisinopril but hold thiazide diuretics in setting of mild to moderate volume depletion in setting of hyperglycemia  Dyslipidemia Continue preadmission Pravachol  Chronic low back pain Patient also reports history of neuropathy that has contributed to her back pain as well Had been on Neurontin but this caused a skin eruption/rash so this was discontinued   Code Status: Full  Code Family Communication: Plan discussed with the patient. Disposition Plan: Remains inpatient Diet: Diet heart healthy/carb modified Room service appropriate?: Yes; Fluid consistency:: Thin  Consultants:  Neurology  Procedures:  None  Antibiotics:  None   Objective: Filed Vitals:   08/20/14 0928  BP: 155/83  Pulse: 93  Temp: 98.2 F (36.8 C)  Resp: 20   No intake or output data in the 24 hours ending 08/20/14 1104 There were no vitals filed for this visit.  Exam: General: Alert and awake, oriented x3, not in any acute distress. HEENT: anicteric sclera, pupils reactive to light and accommodation, EOMI CVS: S1-S2 clear, no murmur rubs or gallops Chest: clear to auscultation bilaterally, no wheezing, rales or rhonchi Abdomen: soft nontender, nondistended, normal bowel sounds, no organomegaly Extremities: no cyanosis, clubbing or edema noted bilaterally Neuro: Cranial nerves II-XII intact, no focal neurological deficits  Data Reviewed: Basic Metabolic Panel:  Recent Labs Lab 08/18/14 1008 08/20/14 0542  NA 133* 133*  K 3.8 3.9  CL 98* 102  CO2 25 23  GLUCOSE 375* 276*  BUN 13 9  CREATININE 0.90 0.94  CALCIUM 9.3 8.6*   Liver Function Tests:  Recent Labs Lab 08/18/14 1008  AST 14*  ALT 14  ALKPHOS 98  BILITOT 0.5  PROT 7.0  ALBUMIN 3.6    Recent Labs Lab 08/18/14 1008  LIPASE 28   No results for input(s): AMMONIA in the last 168 hours. CBC:  Recent Labs Lab 08/18/14 1008  WBC 8.1  NEUTROABS 5.4  HGB 14.1  HCT 41.9  MCV 83.0  PLT 315   Cardiac Enzymes:  Recent Labs Lab 08/18/14 1008  TROPONINI <0.03   BNP (  last 3 results) No results for input(s): BNP in the last 8760 hours.  ProBNP (last 3 results) No results for input(s): PROBNP in the last 8760 hours.  CBG:  Recent Labs Lab 08/19/14 0646 08/19/14 1125 08/19/14 1632 08/19/14 2106 08/20/14 0658  GLUCAP 251* 247* 256* 222* 240*    Micro No results found for  this or any previous visit (from the past 240 hour(s)).   Studies: Ct Angio Head W/cm &/or Wo Cm  08/19/2014   CLINICAL DATA:  Stroke. Nausea vomiting. Right facial numbness. Diabetes and hypertension and hyperlipidemia  EXAM: CT ANGIOGRAPHY HEAD AND NECK  TECHNIQUE: Multidetector CT imaging of the head and neck was performed using the standard protocol during bolus administration of intravenous contrast. Multiplanar CT image reconstructions and MIPs were obtained to evaluate the vascular anatomy. Carotid stenosis measurements (when applicable) are obtained utilizing NASCET criteria, using the distal internal carotid diameter as the denominator.  CONTRAST:  50mL OMNIPAQUE IOHEXOL 350 MG/ML SOLN  COMPARISON:  MRI and MRA head 08/18/2014  FINDINGS: CT HEAD  Brain: MRI revealed small acute infarct in the right posterior lateral medulla, not visualized on CT. No acute infarct identified on CT. Ventricle size normal. Mild hyperdensity in the frontal periventricular white matter bilaterally likely due to mild calcification. No chronic infarct. Negative for hemorrhage or mass.  Calvarium and skull base: Negative  Paranasal sinuses: Negative  Orbits: Negative  CTA NECK  Aortic arch: Normal aortic arch. Left carotid artery origin from the innominate artery is a normal variant. Proximal great vessels widely patent.  Extensive reticular of density in the lung apices bilaterally compatible with pulmonary fibrosis.  Right carotid system: Right common carotid artery normal. Right carotid bifurcation widely patent. Negative for stenosis or dissection. No significant atherosclerotic disease.  Left carotid system: Left common carotid artery widely patent. Left carotid bifurcation normal. No dissection or stenosis.  Vertebral arteries:Left vertebral artery dominant. Both vertebral arteries are patent without significant stenosis. Right vertebral artery ends in PICA.  Skeleton: Mild cervical spine degenerative change. No acute bony  abnormality.  Other neck: Left thyroid 8 mm calcification near the left isthmus. Enlargement of the left lobe of the thyroid with a possible 19 mm nodule on the left. Thyroid ultrasound recommended for further evaluation.  CTA HEAD  Anterior circulation: Mild atherosclerotic disease in the right cavernous carotid with mild stenosis. Right anterior cerebral artery widely patent. Right M1 segment occluded proximally. Right MCA branches are patent with flow through collaterals.  No significant left carotid stenosis through the cavernous segment. Hypoplastic left A1 segment which is patent. Left middle cerebral artery widely patent.  Posterior circulation: Right vertebral artery ends in PICA. Left vertebral artery contributes to the basilar. PICA patent bilaterally. Basilar patent. Superior cerebellar and posterior cerebral arteries patent. Hypoplastic left P1 segment. Fetal origin of the left posterior cerebral artery.  Venous sinuses: Patent  Anatomic variants: Negative for cerebral aneurysm  Delayed phase: Normal enhancement on delayed imaging  IMPRESSION: Carotid and vertebral arteries are patent in the neck without significant stenosis  Occluded right M1 segment as noted on recent MRA.  Right vertebral artery ends in PICA which is a normal variant. Note is made of acute infarct in the right medulla in the right and PICA territory.  Thyroid nodule.  Recommend thyroid ultrasound   Electronically Signed   By: Marlan Palau M.D.   On: 08/19/2014 10:32   Ct Angio Neck W/cm &/or Wo/cm  08/19/2014   CLINICAL DATA:  Stroke. Nausea  vomiting. Right facial numbness. Diabetes and hypertension and hyperlipidemia  EXAM: CT ANGIOGRAPHY HEAD AND NECK  TECHNIQUE: Multidetector CT imaging of the head and neck was performed using the standard protocol during bolus administration of intravenous contrast. Multiplanar CT image reconstructions and MIPs were obtained to evaluate the vascular anatomy. Carotid stenosis measurements (when  applicable) are obtained utilizing NASCET criteria, using the distal internal carotid diameter as the denominator.  CONTRAST:  50mL OMNIPAQUE IOHEXOL 350 MG/ML SOLN  COMPARISON:  MRI and MRA head 08/18/2014  FINDINGS: CT HEAD  Brain: MRI revealed small acute infarct in the right posterior lateral medulla, not visualized on CT. No acute infarct identified on CT. Ventricle size normal. Mild hyperdensity in the frontal periventricular white matter bilaterally likely due to mild calcification. No chronic infarct. Negative for hemorrhage or mass.  Calvarium and skull base: Negative  Paranasal sinuses: Negative  Orbits: Negative  CTA NECK  Aortic arch: Normal aortic arch. Left carotid artery origin from the innominate artery is a normal variant. Proximal great vessels widely patent.  Extensive reticular of density in the lung apices bilaterally compatible with pulmonary fibrosis.  Right carotid system: Right common carotid artery normal. Right carotid bifurcation widely patent. Negative for stenosis or dissection. No significant atherosclerotic disease.  Left carotid system: Left common carotid artery widely patent. Left carotid bifurcation normal. No dissection or stenosis.  Vertebral arteries:Left vertebral artery dominant. Both vertebral arteries are patent without significant stenosis. Right vertebral artery ends in PICA.  Skeleton: Mild cervical spine degenerative change. No acute bony abnormality.  Other neck: Left thyroid 8 mm calcification near the left isthmus. Enlargement of the left lobe of the thyroid with a possible 19 mm nodule on the left. Thyroid ultrasound recommended for further evaluation.  CTA HEAD  Anterior circulation: Mild atherosclerotic disease in the right cavernous carotid with mild stenosis. Right anterior cerebral artery widely patent. Right M1 segment occluded proximally. Right MCA branches are patent with flow through collaterals.  No significant left carotid stenosis through the cavernous  segment. Hypoplastic left A1 segment which is patent. Left middle cerebral artery widely patent.  Posterior circulation: Right vertebral artery ends in PICA. Left vertebral artery contributes to the basilar. PICA patent bilaterally. Basilar patent. Superior cerebellar and posterior cerebral arteries patent. Hypoplastic left P1 segment. Fetal origin of the left posterior cerebral artery.  Venous sinuses: Patent  Anatomic variants: Negative for cerebral aneurysm  Delayed phase: Normal enhancement on delayed imaging  IMPRESSION: Carotid and vertebral arteries are patent in the neck without significant stenosis  Occluded right M1 segment as noted on recent MRA.  Right vertebral artery ends in PICA which is a normal variant. Note is made of acute infarct in the right medulla in the right and PICA territory.  Thyroid nodule.  Recommend thyroid ultrasound   Electronically Signed   By: Marlan Palauharles  Clark M.D.   On: 08/19/2014 10:32   Mr Maxine GlennMra Head Wo Contrast  08/19/2014   ADDENDUM REPORT: 08/19/2014 02:21  ADDENDUM: Acute findings discussed with and reconfirmed by Dr.Neil Amada JupiterKirkpatrick on 08/19/2014 at 2:10am.   Electronically Signed   By: Awilda Metroourtnay  Bloomer M.D.   On: 08/19/2014 02:21   08/19/2014   CLINICAL DATA:  Followup small posterior circulation infarcts, possible RIGHT vertebral artery stenosis/occlusion. RIGHT facial numbness, RIGHT body numbness and weakness. History of stroke, diabetes, hypertension.  EXAM: MRA HEAD WITHOUT CONTRAST  TECHNIQUE: Angiographic images of the Circle of Willis were obtained using MRA technique without intravenous contrast.  COMPARISON:  MRI of  the brain August 18, 2014  FINDINGS: Anterior circulation: Flow related enhancement of the included cervical, petrous, cavernous and supra clinoid internal carotid arteries. Mild luminal regularity of the carotid siphons with at least moderate stenosis RIGHT anterior genu, low likely did atherosclerosis. Patent anterior communicating artery. LEFT A1 segment  appears occluded, normal appearance the anterior cerebral arteries. Moderate stenosis LEFT M1 segment with patent though, relatively thready LEFT middle cerebral artery. Occluded RIGHT distal M1 segment. Bilateral posterior communicating arteries are present.  Posterior circulation: LEFT vertebral artery is dominant. The RIGHT vertebral artery is irregular with moderate stenosis, predominately terminating in the posterior inferior cerebellar artery, the distal RIGHT vertebral artery patent basilar artery. High-grade stenosis LEFT P1 segment with flow via the posterior communicating artery. Slower flow in LEFT posterior cerebral artery.  IMPRESSION: RIGHT distal vertebral artery occlusion, distal to the posterior-inferior cerebellar artery origin. Moderate irregularity of the proximal RIGHT V4 segment likely representing atherosclerosis.  RIGHT M1 occlusion.  LEFT A1 occlusion, patent anterior cerebral arteries via anterior communicating artery.  High-grade stenosis LEFT P1 with patent posterior communicating arteries bilaterally.  Electronically Signed: By: Awilda Metro M.D. On: 08/19/2014 02:10   Mr Brain Wo Contrast (neuro Protocol)  08/18/2014   CLINICAL DATA:  RIGHT-sided facial numbness and leaning to the RIGHT when walking. Symptoms started 4 days ago and have been getting worse. Stroke risk factors include diabetes mellitus, hypertension, history of stroke, and hyperlipidemia.  EXAM: MRI HEAD WITHOUT CONTRAST  TECHNIQUE: Multiplanar, multiecho pulse sequences of the brain and surrounding structures were obtained without intravenous contrast.  COMPARISON:  None.  FINDINGS: There is restricted diffusion involving the RIGHT lateral medulla and RIGHT inferior cerebellar peduncle as seen on DWI image 7 series 4 and image 13 series 5. Findings are consistent with an acute RIGHT posterior inferior cerebellar artery territory infarct. No visible hemorrhage, mass lesion, hydrocephalus, or extra-axial fluid.   Normal for age cerebral volume. Remote cerebral infarct affects the RIGHT frontal opercular cortex and subcortical white matter. Small subcortical RIGHT parietal lacunar infarct also chronic. No significant white matter disease.  There is diminished or absent flow void in the distal RIGHT vertebral artery which could indicate stenosis or recent occlusion. The LEFT vertebral is patent as is the basilar and both carotids. Difficult to confirm or exclude RIGHT PICA patency on this routine brain MR. MRA intracranial could provide additional information.  Partial empty sella. No tonsillar herniation. Upper cervical region and extracranial soft tissues unremarkable. Suspected intraparotid lymph node on the LEFT is incompletely evaluated.  IMPRESSION: Acute subcentimeter infarct of the RIGHT lateral medulla and RIGHT inferior cerebellar peduncle, within the distribution of the RIGHT posterior-inferior cerebellar artery.  Diminished or absent flow void in the distal RIGHT vertebral artery could represent stenosis or occlusion.  Areas of chronic infarction in the RIGHT hemisphere affecting the frontal and parietal lobes as described. No foci of acute or chronic hemorrhage.   Electronically Signed   By: Elsie Stain M.D.   On: 08/18/2014 12:20   Dg Abd Acute W/chest  08/18/2014   CLINICAL DATA:  Right facial numbness and leaning to the right while walking today. History of prior stroke. Initial encounter.  EXAM: DG ABDOMEN ACUTE W/ 1V CHEST  COMPARISON:  None.  FINDINGS: Single view of the chest demonstrates clear lungs and normal heart size. No pneumothorax or pleural effusion.  Two views of the abdomen show no free intraperitoneal air. The bowel gas pattern is normal. No unexpected abdominal calcification is identified.  IMPRESSION: Negative exam.   Electronically Signed   By: Drusilla Kanner M.D.   On: 08/18/2014 12:44    Scheduled Meds: . aspirin EC  81 mg Oral Brooks  . clopidogrel  75 mg Oral Brooks  .  enoxaparin (LOVENOX) injection  40 mg Subcutaneous Q24H  . insulin aspart  0-20 Units Subcutaneous TID WC  . insulin aspart  6 Units Subcutaneous TID WC  . insulin glargine  40 Units Subcutaneous QHS  . linagliptin  5 mg Oral Brooks  . lisinopril  10 mg Oral Brooks  . pravastatin  40 mg Oral Brooks  . venlafaxine  37.5 mg Oral BID WC   Continuous Infusions: . sodium chloride 50 mL/hr at 08/19/14 0646       Time spent: 35 minutes    [facility] A  [facility] Pager [phone] If 7PM-7AM, please contact night-coverage at [email or web address], password [facility] 08/20/2014, 11:04 AM  LOS: 2 days

## 2014-08-20 NOTE — Progress Notes (Signed)
STROKE TEAM PROGRESS NOTE   HISTORY Michelle Brooks is a 49 y.o. female history diabetes mellitus, hypertension, cerebral infarction, hyperlipidemia and peripheral neuropathy, presenting with right facial numbness and weakness on her right side with tendency to stumble towards the right when she walks. Symptoms began on 08/14/2014 area she's been taking aspirin daily. MRI of the brain showed small acute stroke involving right lateral range stem and cerebellum peduncle. There was also diminished to absent flow in the right vertebral artery. Areas of chronic infarction were noted in the right frontal and parietal regions. NIH stroke score was 3.  LSN: 08/14/2014 tPA Given: No: Beyond time window for treatment consideration mRankin:    SUBJECTIVE (INTERVAL HISTORY) Family member sleeping in the room. Patient reports she is having a headache this morning. No other complaints voiced.   OBJECTIVE Temp:  [97.9 F (36.6 C)-98.9 F (37.2 C)] 98.2 F (36.8 C) (07/10 0928) Pulse Rate:  [79-93] 93 (07/10 0928) Cardiac Rhythm:  [-] Normal sinus rhythm (07/09 1550) Resp:  [16-20] 20 (07/10 0928) BP: (148-173)/(83-104) 155/83 mmHg (07/10 0928) SpO2:  [94 %-100 %] 98 % (07/10 0928)   Recent Labs Lab 08/19/14 0646 08/19/14 1125 08/19/14 1632 08/19/14 2106 08/20/14 0658  GLUCAP 251* 247* 256* 222* 240*    Recent Labs Lab 08/18/14 1008 08/20/14 0542  NA 133* 133*  K 3.8 3.9  CL 98* 102  CO2 25 23  GLUCOSE 375* 276*  BUN 13 9  CREATININE 0.90 0.94  CALCIUM 9.3 8.6*    Recent Labs Lab 08/18/14 1008  AST 14*  ALT 14  ALKPHOS 98  BILITOT 0.5  PROT 7.0  ALBUMIN 3.6    Recent Labs Lab 08/18/14 1008  WBC 8.1  NEUTROABS 5.4  HGB 14.1  HCT 41.9  MCV 83.0  PLT 315    Recent Labs Lab 08/18/14 1008  TROPONINI <0.03   No results for input(s): LABPROT, INR in the last 72 hours.  Recent Labs  08/18/14 1235  COLORURINE YELLOW  LABSPEC 1.039*  PHURINE 6.0  GLUCOSEU  >1000*  HGBUR NEGATIVE  BILIRUBINUR NEGATIVE  KETONESUR 15*  PROTEINUR NEGATIVE  UROBILINOGEN 0.2  NITRITE NEGATIVE  LEUKOCYTESUR NEGATIVE       Component Value Date/Time   CHOL 174 08/19/2014 0500   TRIG 348* 08/19/2014 0500   HDL 26* 08/19/2014 0500   CHOLHDL 6.7 08/19/2014 0500   VLDL 70* 08/19/2014 0500   LDLCALC 78 08/19/2014 0500   Lab Results  Component Value Date   HGBA1C 13.2 05/13/2012   No results found for: LABOPIA, COCAINSCRNUR, LABBENZ, AMPHETMU, THCU, LABBARB  No results for input(s): ETH in the last 168 hours.     Imaging  Mr Shirlee Latch Wo Contrast 08/19/2014    RIGHT distal vertebral artery occlusion, distal to the posterior-inferior cerebellar artery origin. Moderate irregularity of the proximal RIGHT V4 segment likely representing atherosclerosis.   RIGHT M1 occlusion.   LEFT A1 occlusion, patent anterior cerebral arteries via anterior communicating artery.   High-grade stenosis LEFT P1 with patent posterior communicating arteries bilaterally.     Mr Brain Wo Contrast (neuro Protocol) 08/18/2014    Acute subcentimeter infarct of the RIGHT lateral medulla and RIGHT inferior cerebellar peduncle, within the distribution of the RIGHT posterior-inferior cerebellar artery.   Diminished or absent flow void in the distal RIGHT vertebral artery could represent stenosis or occlusion.  Areas of chronic infarction in the RIGHT hemisphere affecting the frontal and parietal lobes as described. No foci of acute or  chronic hemorrhage.     CTA of the head and Neck 08/19/2014  Carotid and vertebral arteries are patent in the neck withoutsignificant stenosis  Occluded right M1 segment as noted on recent MRA.  Right vertebral artery ends in PICA which is a normal variant. Notes made of acute infarct in the right medulla in the right and PICA territory.  Thyroid nodule. Recommend thyroid ultrasound   Dg Abd Acute W/chest 08/18/2014    Negative exam.      2D echo  - - Left ventricle: The cavity size was normal. There was mild concentric hypertrophy. Systolic function was normal. Wall motion was normal; there were no regional wall motion abnormalities. Doppler parameters are consistent with abnormal left ventricular relaxation (grade 1 diastolic dysfunction). Impressions: - No cardiac source of emboli was indentified.   PHYSICAL EXAM  Temp:  [97.9 F (36.6 C)-98.9 F (37.2 C)] 98.2 F (36.8 C) (07/10 0928) Pulse Rate:  [79-93] 93 (07/10 0928) Resp:  [16-20] 20 (07/10 0928) BP: (148-173)/(83-104) 155/83 mmHg (07/10 0928) SpO2:  [94 %-100 %] 98 % (07/10 0928)  General - Well nourished, well developed, in no apparent distress.  Ophthalmologic - Fundi not visualized due to photophobia.  Cardiovascular - Regular rate and rhythm with no murmur.  Mental Status -  Level of arousal and orientation to time, place, and person were intact. Language including expression, naming, repetition, comprehension was assessed and found intact. Fund of Knowledge was assessed and was intact.  Cranial Nerves II - XII - II - Visual field intact OU. III, IV, VI - Extraocular movements intact. V - Facial sensation decreased on the right. VII - Facial movement intact bilaterally. VIII - Hearing & vestibular intact bilaterally. X - Palate elevates symmetrically. XI - Chin turning & shoulder shrug intact bilaterally. XII - Tongue protrusion intact.  Motor Strength - The patient's strength was normal in all extremities and pronator drift was absent.  Bulk was normal and fasciculations were absent.   Motor Tone - Muscle tone was assessed at the neck and appendages and was normal.  Reflexes - The patient's reflexes were 1+ in all extremities and she had no pathological reflexes.  Sensory - Light touch, temperature/pinprick were assessed and were symmetrical.    Coordination - The patient had normal movements in the left hand and leg but dysmetria on right  FTN and HTS.  Tremor was absent.  Gait and Station - deferred due to safety concerns   ASSESSMENT/PLAN Michelle Brooks is a 49 y.o. female with history of diabetes mellitus, hypertension, previous strokes, hyperlipidemia, and neuropathy presenting with right facial numbness, right hemiparesis, and unsteady gait . She did not receive IV t-PA due to late presentation.  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 Risk factor including uncontrolled diabetes and hypertension, hyperlipidemia with obesity.  Resultant right hemiparesis (typical Wallenberg presentation)  MRI - acute subcentimeter infarct of the RIGHT lateral medulla and RIGHT inferior cerebellar peduncle.  MRA - R. distal vertebral artery occlusion, R. M1 occlusion, High-grade stenosis L. P1  CTA head and neck - right M1 occlusion and right VA ends in PICA  2D Echo unremarkable  LDL 78  HgbA1c pending  Lovenox for VTE prophylaxis Diet heart healthy/carb modified Room service appropriate?: Yes; Fluid consistency:: Thin  aspirin 325 mg orally every day prior to admission, now on aspirin 81 mg orally  every day and clopidogrel 75 mg orally every day. Continue dual antiplatelet for 3 months and then Plavix alone.  Patient counseled to be compliant with her antithrombotic medications  Ongoing aggressive stroke risk factor management  Therapy recommendations: CIR recommended - screening has been ordered  Disposition:  Pending  Hypertension  Home meds: Lisinopril with hydrochlorothiazide Permissive hypertension (OK if <220/120) for 24-48 hours post stroke and then gradually normalized within 5-7 days.  Stable  Patient counseled to be compliant with her blood pressure medications  Hyperlipidemia  Home meds:  Pravachol 40 mg daily resumed in hospital  LDL 78, goal <  70  Continue statin at discharge  Diabetes  HgbA1c pending, goal < 7.0  Uncontrolled  On Lantus, Januvia  Other Stroke Risk Factors  Cigarette smoker, quit smoking in 2006   ETOH use  Obesity  Hx stroke/TIA  Family hx stroke (mother)  Other Active Problems  Thyroid nodule needs further evaluation.  Sodium 133  Other Pertinent History  Not checking glucose at home   Needs financial support  Hospital day # 2  Delton Seeavid Rinehuls PA-C [facility] Pager [phone] 08/20/2014, 11:19 AM  I, the attending vascular neurologist, have personally obtained a history, examined the patient, evaluated laboratory data, individually viewed imaging studies and agree with radiology interpretations. Together with the NP/PA, we formulated the assessment and plan of care which reflects our mutual decision.  I have made any additions or clarifications directly to the above note and agree with the findings and plan as currently documented.   49 year old female with history of right MCA stroke, diabetes, hypertension and hyperlipidemia, on home aspirin was admitted for right lateral medullary infarct, consistent with small vessel disease. MRA and CTA showed right M1 cut off and the right VA occlusion versus ending in PICA. Not able to check glucose at home due to financial difficulty. A1c pending, LDL 78 on pravastatin at home. Recommended dural antiplatelet and continue statin. Aggressive management of risk factors.  Neurology will sign off. Please call with questions. [facility]. Thanks for the consult.  Marvel PlanJindong Marvell Stavola, MD PhD Stroke Neurology 08/20/2014 11:19 AM        To contact Stroke Continuity provider, please refer to [email or web address]. After hours, contact General Neurology

## 2014-08-20 NOTE — Progress Notes (Signed)
PT Cancellation Note  Patient Details Name: Michelle BoettcherWanda D Brooks MRN: [id number] DOB: 01/10/1966   Cancelled Treatment:    Reason Eval/Treat Not Completed: Patient declined, no reason specified Pt reports having a headache that has not resolved. Requests PT be held at this time. Encouraged to get out of bed with staff when able. Will follow up again this afternoon if time/schedule allows.  Berton MountBarbour, Elery Cadenhead S 08/20/2014, 2:54 PM   Sunday SpillersLogan Secor VintonBarbour, [place]PT [phone]

## 2014-08-20 NOTE — Progress Notes (Signed)
OT Cancellation Note  Patient Details Name: Maurie BoettcherWanda D Brooks MRN: [id number] DOB: 12/28/1965   Cancelled Treatment:    Reason Eval/Treat Not Completed: Pain limiting ability to participate - headache.   Angelene GiovanniConarpe, Daniela Siebers M  Chasmine Lender Claytononarpe, OTR/L [phone]  08/20/2014, 5:57 PM

## 2014-08-21 ENCOUNTER — Encounter (HOSPITAL_COMMUNITY): Payer: Self-pay | Admitting: Physical Medicine and Rehabilitation

## 2014-08-21 DIAGNOSIS — I639 Cerebral infarction, unspecified: Principal | ICD-10-CM

## 2014-08-21 LAB — HEMOGLOBIN A1C
HEMOGLOBIN A1C: 12.7 % — AB (ref 4.8–5.6)
Mean Plasma Glucose: 318 mg/dL

## 2014-08-21 LAB — GLUCOSE, CAPILLARY
GLUCOSE-CAPILLARY: 207 mg/dL — AB (ref 65–99)
Glucose-Capillary: 219 mg/dL — ABNORMAL HIGH (ref 65–99)
Glucose-Capillary: 196 mg/dL — ABNORMAL HIGH (ref 65–99)
Glucose-Capillary: 183 mg/dL — ABNORMAL HIGH (ref 65–99)

## 2014-08-21 LAB — BASIC METABOLIC PANEL
Anion gap: 8 (ref 5–15)
BUN: 8 mg/dL (ref 6–20)
CALCIUM: 8.9 mg/dL (ref 8.9–10.3)
CHLORIDE: 100 mmol/L — AB (ref 101–111)
CO2: 25 mmol/L (ref 22–32)
Creatinine, Ser: 0.81 mg/dL (ref 0.44–1.00)
GFR calc Af Amer: >60 mL/min (ref >60)
GFR calc non Af Amer: >60 mL/min (ref >60)
Glucose, Bld: 213 mg/dL — ABNORMAL HIGH (ref 65–99)
POTASSIUM: 3.2 mmol/L — AB (ref 3.5–5.1)
Sodium: 133 mmol/L — ABNORMAL LOW (ref 135–145)

## 2014-08-21 MED ORDER — POTASSIUM CHLORIDE CRYS ER 20 MEQ PO TBCR
40.0000 meq | EXTENDED_RELEASE_TABLET | Freq: Four times a day (QID) | ORAL | Status: AC
  Administered 2014-08-21: 40 meq via ORAL
  Filled 2014-08-21: qty 2

## 2014-08-21 MED ORDER — INSULIN ASPART PROT & ASPART (70-30 MIX) 100 UNIT/ML ~~LOC~~ SUSP
60.0000 [IU] | Freq: Three times a day (TID) | SUBCUTANEOUS | Status: DC
  Administered 2014-08-21 – 2014-08-22 (×4): 60 [IU] via SUBCUTANEOUS
  Filled 2014-08-21: qty 10

## 2014-08-21 MED ORDER — HYDRALAZINE HCL 20 MG/ML IJ SOLN
10.0000 mg | Freq: Four times a day (QID) | INTRAMUSCULAR | Status: DC | PRN
  Administered 2014-08-21: 10 mg via INTRAVENOUS
  Filled 2014-08-21: qty 1

## 2014-08-21 MED ORDER — ONDANSETRON HCL 4 MG PO TABS
4.0000 mg | ORAL_TABLET | Freq: Four times a day (QID) | ORAL | Status: DC | PRN
  Administered 2014-08-21: 4 mg via ORAL
  Filled 2014-08-21 (×2): qty 1

## 2014-08-21 MED ORDER — LISINOPRIL 20 MG PO TABS
20.0000 mg | ORAL_TABLET | Freq: Every day | ORAL | Status: DC
  Administered 2014-08-21 – 2014-08-22 (×2): 20 mg via ORAL
  Filled 2014-08-21 (×2): qty 1

## 2014-08-21 NOTE — Progress Notes (Signed)
Met with patient and fiance at bedside.  Patient had many questions regarding her Medicaid application.  CM explained that Medicaid application related questions can be better answered by the Financial Counselor.  Patient's chart is currently being followed by Roland Earl in [employer].  CM spoke with Roland Earl via phone to relay questions.  Roland Earl states she will call into the room and speak with patient.  CM discussed patient's discharge medications.  Patient states that she is currently enrolled in a Humalog assistance program, which will remain active until February or March of 2017.  Per patient, all other medications are off of the $4 list or samples are provided by her PCP.  Patient states that she cannot afford inpatient rehab or SNF rehab and prefers to discharge to home.  CM explained role in discharge planning and will set up home health services/DME as ordered.  Will continue to follow.

## 2014-08-21 NOTE — Progress Notes (Signed)
Physical Therapy Treatment Patient Details Name: Michelle BoettcherWanda D Lao MRN: [id number] DOB: 02/01/1966 Today's Date: 08/21/2014    History of Present Illness Michelle BoettcherWanda D Walkup is an 49 y.o. female history diabetes mellitus, hypertension, cerebral infarction, hyperlipidemia and peripheral neuropathy, presenting with right facial numbness and weakness on her right side with tendency to stumble towards the right when she walks. MRI of the brain showed acute infarct in the right lateral medulla and right inferior cerebellar peduncle.    PT Comments    Patient agreeable to therapy this AM and stated that headache was somewhat better than yesterday. Patient very slow with ambulation but appeared more stable and requiring less assistance with use of RW vs. Quad cane. Awaiting word from CIR. Will continue with current POC. Finance present and very supportive.   Follow Up Recommendations  CIR     Equipment Recommendations  Rolling walker with 5" wheels    Recommendations for Other Services       Precautions / Restrictions Precautions Precautions: Fall    Mobility  Bed Mobility Overal bed mobility: Modified Independent             General bed mobility comments: extra time  Transfers Overall transfer level: Needs assistance     Sit to Stand: Min guard         General transfer comment: Min guard for safety. Very gaurded. VC for hand placement. No buckling of RLE noted.  Ambulation/Gait Ambulation/Gait assistance: Min assist Ambulation Distance (Feet): 30 Feet (x2) Assistive device: Rolling walker (2 wheeled) Gait Pattern/deviations: Step-to pattern;Decreased step length - left;Decreased stance time - right Gait velocity: slow Gait velocity interpretation: Below normal speed for age/gender General Gait Details: Initially started out with use of quad cane in Rt hand however noted patient reaching out for assistance on the L side and required Min A with use of quad cane to ensure  balance. Had patient switch to use of RW and she felt much more stable requiring Minguard A. Patient with decreased gait speed but no LOB. Complained of slight dizziness with ambulation. HR as high as 122 at one point. One seated rest break required.    Stairs            Wheelchair Mobility    Modified Rankin (Stroke Patients Only) Modified Rankin (Stroke Patients Only) Pre-Morbid Rankin Score: Moderately severe disability Modified Rankin: Moderately severe disability     Balance                                    Cognition Arousal/Alertness: Awake/alert Behavior During Therapy: WFL for tasks assessed/performed Overall Cognitive Status: Within Functional Limits for tasks assessed                      Exercises      General Comments        Pertinent Vitals/Pain Pain Assessment: No/denies pain    Home Living                      Prior Function            PT Goals (current goals can now be found in the care plan section) Progress towards PT goals: Progressing toward goals    Frequency  Min 4X/week    PT Plan Current plan remains appropriate;Equipment recommendations need to be updated    Co-evaluation  End of Session Equipment Utilized During Treatment: Gait belt Activity Tolerance: Patient tolerated treatment well Patient left: in chair;with call bell/phone within reach;with family/visitor present     Time: 1610-9604 PT Time Calculation (min) (ACUTE ONLY): 28 min  Charges:  $Gait Training: 8-22 mins $Therapeutic Activity: 8-22 mins                    G Codes:      Fredrich Birks 08/21/2014, 9:52 AM 08/21/2014 Fredrich Birks PTA [phone] pager [phone] office

## 2014-08-21 NOTE — Progress Notes (Signed)
[facility] PROGRESS NOTE   Michelle Brooks:[id number] DOB: 1965/03/27 DOA: 08/18/2014 PCP: Dorrene German, MD  HPI/Subjective: Patient seen with boyfriend, daughter and some other young female in the room. Reported nausea but no vomiting after she took the potassium supplements. Patient about to be discharged, reported that her nausea has severe, keep one more day in the hospital.  Assessment/Plan: Principal Problem:   Stroke Active Problems:   Diabetes mellitus type 2, uncontrolled, with complications   Hypertension   History of stroke   Dyslipidemia   Chronic low back pain   CVA (cerebral infarction)    Stroke/history of CVA Patient presented to the hospital with right facial numbness and slurred speech. MRI of the brain showed acute infarct in the right lateral medulla and right inferior cerebellar peduncle. MRA of the brain showed occlusion of right distal vertebral artery, M1 and A1 with high-grade stenosis of P1. Neurology recommended dual antiplatelets with aspirin and Plavix.  Diabetes mellitus type 2, uncontrolled, with complications Was on Humulin 50/50 insulin 80 units 3 times a day at home- will utilize Lantus 25 units at hour of sleep while hospitalized Was on metformin and Januvia as well. I told him we'll switch to Lantus, patient and significant other at bedside very resistant that it did not work before. Check hemoglobin A1c (previous A1c was 13.2 on 4/14), still pending. Reviewed the previous records and showed patient was on up to 90 units daily of Lantus.  Hypertension Current blood pressure controlled Continue lisinopril but hold thiazide diuretics in setting of mild to moderate volume depletion in setting of hyperglycemia  Dyslipidemia Continue preadmission Pravachol  Chronic low back pain Patient also reports history of neuropathy that has contributed to her back pain as well Had been on Neurontin but this caused a skin eruption/rash so  this was discontinued   Code Status: Full Code Family Communication: Plan discussed with the patient. Disposition Plan: Remains inpatient Diet: Diet heart healthy/carb modified Room service appropriate?: Yes; Fluid consistency:: Thin  Consultants:  Neurology  Procedures:  None  Antibiotics:  None   Objective: Filed Vitals:   08/21/14 0918  BP: 156/99  Pulse: 87  Temp: 98.8 F (37.1 C)  Resp: 20   No intake or output data in the 24 hours ending 08/21/14 1412 There were no vitals filed for this visit.  Exam: General: Alert and awake, oriented x3, not in any acute distress. HEENT: anicteric sclera, pupils reactive to light and accommodation, EOMI CVS: S1-S2 clear, no murmur rubs or gallops Chest: clear to auscultation bilaterally, no wheezing, rales or rhonchi Abdomen: soft nontender, nondistended, normal bowel sounds, no organomegaly Extremities: no cyanosis, clubbing or edema noted bilaterally Neuro: Cranial nerves II-XII intact, no focal neurological deficits  Data Reviewed: Basic Metabolic Panel:  Recent Labs Lab 08/18/14 1008 08/20/14 0542 08/21/14 0629  NA 133* 133* 133*  K 3.8 3.9 3.2*  CL 98* 102 100*  CO2 GLUCOSE 375* 276* 213*  BUN CREATININE 0.90 0.94 0.81  CALCIUM 9.3 8.6* 8.9   Liver Function Tests:  Recent Labs Lab 08/18/14 1008  AST 14*  ALT 14  ALKPHOS 98  BILITOT 0.5  PROT 7.0  ALBUMIN 3.6    Recent Labs Lab 08/18/14 1008  LIPASE 28   No results for input(s): AMMONIA in the last 168 hours. CBC:  Recent Labs Lab 08/18/14 1008  WBC 8.1  NEUTROABS 5.4  HGB 14.1  HCT 41.9  MCV 83.0  PLT 315   Cardiac Enzymes:  Recent Labs Lab 08/18/14 1008  TROPONINI <0.03   BNP (last 3 results) No results for input(s): BNP in the last 8760 hours.  ProBNP (last 3 results) No results for input(s): PROBNP in the last 8760 hours.  CBG:  Recent Labs Lab 08/20/14 1117 08/20/14 1602 08/20/14 2106  08/21/14 0642 08/21/14 1119  GLUCAP 294* 169* 178* 207* 219*    Micro No results found for this or any previous visit (from the past 240 hour(s)).   Studies: No results found.  Scheduled Meds: . aspirin EC  81 mg Oral Daily  . clopidogrel  75 mg Oral Daily  . enoxaparin (LOVENOX) injection  40 mg Subcutaneous Q24H  . hydrochlorothiazide  12.5 mg Oral Daily  . insulin aspart  0-20 Units Subcutaneous TID WC  . insulin aspart  0-5 Units Subcutaneous QHS  . insulin aspart protamine- aspart  60 Units Subcutaneous TID WC  . linagliptin  5 mg Oral Daily  . lisinopril  20 mg Oral Daily  . potassium chloride  40 mEq Oral Q6H  . pravastatin  40 mg Oral Daily  . venlafaxine  37.5 mg Oral BID WC   Continuous Infusions:       Time spent: 35 minutes    [facility] CenterELMAHI,Nattalie Santiesteban A  [facility] Pager [phone] If 7PM-7AM, please contact night-coverage at [email or web address], password [email or web address] 08/21/2014, 2:12 PM  LOS: 3 days

## 2014-08-21 NOTE — Progress Notes (Signed)
Rehab Admissions Coordinator Note:  Patient was screened by Clois DupesBoyette, Avalee Castrellon Godwin for appropriateness for an Inpatient Acute Rehab Consult per PT recommendation. At this time, we are recommending Inpatient Rehab consult.  Clois DupesBoyette, Tacey Dimaggio Godwin 08/21/2014, 7:58 AM  I can be reached at [phone].

## 2014-08-21 NOTE — Consult Note (Signed)
Physical Medicine and Rehabilitation Consult   Reason for Consult: Right sided weakness and right facial numbness Referring Physician:  Dr. Erlinda Hong   HPI: Michelle Brooks is a 49 y.o. female with history of DM type 2 with neuropathy/retinopathy, chronic back pain, CVA; who was admitted on 08/18/14 with reports of right sided weakness with difficulty walking and right facial numbness X 4 days. 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 2D echo done normal systolic function with grade 1 diastolic dysfunction. Dr. 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 PT evaluation done this weekend and patient limited by ataxia, nausea and anxiety. CIR recommended for follow up therapy.    Review of Systems  HENT: Negative for hearing loss and tinnitus.   Eyes: Positive for blurred vision (has to wear reading glasses). Negative for double vision and photophobia.  Respiratory: Negative for cough, shortness of breath and wheezing.   Cardiovascular: Positive for chest pain (occasional sharp pains). Negative for palpitations.  Gastrointestinal: Positive for heartburn and nausea (with acitivity since admisison). Negative for abdominal pain and constipation.  Musculoskeletal: Positive for myalgias and back pain.  Neurological: Positive for dizziness (with acitivity), sensory change (numbness tingling bilateral hands and feet occasionally), speech change and headaches. Negative for focal weakness.  Psychiatric/Behavioral: The patient is nervous/anxious.      Past Medical History  Diagnosis Date  . Diabetes mellitus type 2 in obese   . Hypertension   . History of stroke 03/2010    Right MCA stroke in 03/2010, with MRI also noting  subacute strokes in left fronto parietal area - occured in [place] received care at [facility] thought due to uncontrolled blood pressure // No residual deficits // TTE (03/2010) at [facility] - normal LV systolic function, moderate pericardial effusion with 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  [name] Hyperlipidemia LDL goal < 100 04/01/2011  . Chronic back pain   . Headache   . Diabetic neuropathy   . Diabetic retinopathy     Past Surgical History  Procedure Laterality Date  . Breast lumpectomy  2002    states benign lymph node removed.    Family History  Problem Relation Age of Onset  . Diabetes Mother   . Stroke Mother 12  . Diabetes Brother   . Diabetes Brother   . Cancer Brother 38    unknown type  . Hypertension Brother      Social History:  Lives with fiancee and daughters. Moved from [place] 2012--used to work in [place] and awaiting disability. Independent but limited by back pain. She reports that she quit smoking about 10 years ago. Her smoking use included Cigarettes. She has a 1.4 pack-year smoking history. She has never used smokeless tobacco. She reports that she drinks alcohol. She reports that she does not use illicit drugs.    Allergies: No Known Allergies    Medications Prior to Admission  Medication Sig Dispense Refill  . aspirin 325 MG tablet Take 325 mg by mouth daily.    . insulin lispro protamine-lispro (HUMALOG 50/50) (50-50) 100 UNIT/ML SUSP injection Inject 80 Units into the skin 3 (three) times daily. Up to 60 depending on cbg    .  lisinopril-hydrochlorothiazide (PRINZIDE) 10-12.5 MG per tablet Take 1 tablet by mouth daily. 30 tablet 0  . metFORMIN (GLUCOPHAGE) 500 MG tablet Take 1 tablet (500 mg total) by mouth daily with breakfast. (Patient taking differently: Take 500 mg by mouth 2 (two) times daily with a meal. ) 90 tablet 1  . pravastatin (PRAVACHOL) 40 MG tablet Take 1 tablet (40 mg total) by mouth  daily. 30 tablet 5  . sitaGLIPtin (JANUVIA) 100 MG tablet Take 100 mg by mouth daily.    [name] aspirin EC 325 MG tablet Take 1 tablet (325 mg total) by mouth daily. 100 tablet 3  . Blood Glucose Monitoring Suppl (ACCU-CHEK AVIVA PLUS) W/DEVICE KIT 1 kit by Does not apply route 3 (three) times daily before meals. (Patient not taking: Reported on 08/18/2014) 1 kit 0  . gabapentin (NEURONTIN) 300 MG capsule Take 300 mg by mouth 3 (three) times daily.    [name] glucose blood (ACCU-CHEK AVIVA) test strip Use as instructed (Patient not taking: Reported on 08/18/2014) 100 each 12  . Multiple Vitamins-Minerals (MULTIVITAMIN WITH MINERALS) tablet Take 1 tablet by mouth daily.    [name] venlafaxine (EFFEXOR) 37.5 MG tablet Take 37.5 mg by mouth daily.      Home: Home Living Family/patient expects to be discharged to:: Private residence Living Arrangements: Spouse/significant other Available Help at Discharge: Family, Available 24 hours/day Type of Home: Apartment Home Access: Stairs to enter [employer] of Steps: 15 Entrance Stairs-Rails: Left Home Layout: One level Home Equipment: Cane - quad  Functional History: Prior Function Level of Independence: Needs assistance Gait / Transfers Assistance Needed: uses quad cane for mobility ADL's / Homemaking Assistance Needed: husband sometimes assists pt with getting out of tub. Functional Status:  Mobility: Bed Mobility Overal bed mobility: Modified Independent General bed mobility comments: extra time Transfers Overall transfer level: Needs assistance Equipment used: None Transfers: Sit to/from Stand Sit to Stand: Min guard General transfer comment: Min guard for safety. Very gaurded, heavily gripping bed rail initially. VC for hand placement. No buckling of RLE noted. Ambulation/Gait Ambulation/Gait assistance: Min assist Ambulation Distance (Feet): 45 Feet Assistive device: Quad cane General Gait Details: Gait training with quad cane use in Rt vs  Lt hand, appears more stable with quad cane in Lt hand however pt feels more comfortable with cane in Rt hand. VC for sequencing. Min assist intermittently for balance. No buckling noted. Very fearful and anxious. Reports nausea and increased lower back pain. Good foot control without significant ataxia however Rt ankle appears to be slightly unstable. Good heel strike bil. Relies heavily on quad cane. HR elevated to 121 during bout. Gait Pattern/deviations: Step-to pattern, Step-through pattern, Decreased step length - left, Decreased stance time - right, Drifts right/left, Wide base of support Gait velocity: slow Gait velocity interpretation: <1.8 ft/sec, indicative of risk for recurrent falls    ADL:    Cognition: Cognition Overall Cognitive Status: Within Functional Limits for tasks assessed Orientation Level: Oriented X4 Cognition Arousal/Alertness: Awake/alert Behavior During Therapy: WFL for tasks assessed/performed Overall Cognitive Status: Within Functional Limits for tasks assessed  Blood pressure 156/92, pulse 84, temperature 98.8 F (37.1 C), temperature source Oral, resp. rate 20, last menstrual period 07/25/2014, SpO2 100 %. Physical Exam  Constitutional: She is oriented to person, place, and time. She appears well-developed and well-nourished.  HENT:  Head: Normocephalic and atraumatic.  Neck: No thyromegaly present.  Cardiovascular:  Tachy with exercise  Respiratory: No respiratory distress.  Neurological: She is alert and oriented to  person, place, and time.  Mild limb ataxia on right. Decreased [place] and limb control in standing. Cognitively intact  Skin: No erythema.  Psychiatric: She has a normal mood and affect.    Results for orders placed or performed during the hospital encounter of 08/18/14 (from the past 24 hour(s))  Glucose, capillary     Status: Abnormal   Collection Time: 08/20/14 11:17 AM  Result Value Ref Range   Glucose-Capillary 294 (H) 65 - 99  mg/dL   Comment 1 Notify RN    Comment 2 Document in Chart   Glucose, capillary     Status: Abnormal   Collection Time: 08/20/14  4:02 PM  Result Value Ref Range   Glucose-Capillary 169 (H) 65 - 99 mg/dL   Comment 1 Notify RN    Comment 2 Document in Chart   Glucose, capillary     Status: Abnormal   Collection Time: 08/20/14  9:06 PM  Result Value Ref Range   Glucose-Capillary 178 (H) 65 - 99 mg/dL   Comment 1 Notify RN    Comment 2 Document in Chart   Basic metabolic panel     Status: Abnormal   Collection Time: 08/21/14  6:29 AM  Result Value Ref Range   Sodium 133 (L) 135 - 145 mmol/L   Potassium 3.2 (L) 3.5 - 5.1 mmol/L   Chloride 100 (L) 101 - 111 mmol/L   CO2 25 22 - 32 mmol/L   Glucose, Bld 213 (H) 65 - 99 mg/dL   BUN 8 6 - 20 mg/dL   Creatinine, Ser 0.81 0.44 - 1.00 mg/dL   Calcium 8.9 8.9 - 10.3 mg/dL   GFR calc non Af Amer >60 >60 mL/min   GFR calc Af Amer >60 >60 mL/min   Anion gap 8 5 - 15  Glucose, capillary     Status: Abnormal   Collection Time: 08/21/14  6:42 AM  Result Value Ref Range   Glucose-Capillary 207 (H) 65 - 99 mg/dL   Ct Angio Head W/cm &/or Wo Cm  08/19/2014   CLINICAL DATA:  Stroke. Nausea vomiting. Right facial numbness. Diabetes and hypertension and hyperlipidemia  EXAM: CT ANGIOGRAPHY HEAD AND NECK  TECHNIQUE: Multidetector CT imaging of the head and neck was performed using the standard protocol during bolus administration of intravenous contrast. Multiplanar CT image reconstructions and MIPs were obtained to evaluate the vascular anatomy. Carotid stenosis measurements (when applicable) are obtained utilizing NASCET criteria, using the distal internal carotid diameter as the denominator.  CONTRAST:  10m OMNIPAQUE IOHEXOL 350 MG/ML SOLN  COMPARISON:  MRI and MRA head 08/18/2014  FINDINGS: CT HEAD  Brain: MRI revealed small acute infarct in the right posterior lateral medulla, not visualized on CT. No acute infarct identified on CT. Ventricle size  normal. Mild hyperdensity in the frontal periventricular white matter bilaterally likely due to mild calcification. No chronic infarct. Negative for hemorrhage or mass.  Calvarium and skull base: Negative  Paranasal sinuses: Negative  Orbits: Negative  CTA NECK  Aortic arch: Normal aortic arch. Left carotid artery origin from the innominate artery is a normal variant. Proximal great vessels widely patent.  Extensive reticular of density in the lung apices bilaterally compatible with pulmonary fibrosis.  Right carotid system: Right common carotid artery normal. Right carotid bifurcation widely patent. Negative for stenosis or dissection. No significant atherosclerotic disease.  Left carotid system: Left common carotid artery widely patent. Left carotid bifurcation normal. No dissection or stenosis.  Vertebral arteries:Left vertebral artery dominant. Both  vertebral arteries are patent without significant stenosis. Right vertebral artery ends in PICA.  Skeleton: Mild cervical spine degenerative change. No acute bony abnormality.  Other neck: Left thyroid 8 mm calcification near the left isthmus. Enlargement of the left lobe of the thyroid with a possible 19 mm nodule on the left. Thyroid ultrasound recommended for further evaluation.  CTA HEAD  Anterior circulation: Mild atherosclerotic disease in the right cavernous carotid with mild stenosis. Right anterior cerebral artery widely patent. Right M1 segment occluded proximally. Right MCA branches are patent with flow through collaterals.  No significant left carotid stenosis through the cavernous segment. Hypoplastic left A1 segment which is patent. Left middle cerebral artery widely patent.  Posterior circulation: Right vertebral artery ends in PICA. Left vertebral artery contributes to the basilar. PICA patent bilaterally. Basilar patent. Superior cerebellar and posterior cerebral arteries patent. Hypoplastic left P1 segment. Fetal origin of the left posterior  cerebral artery.  Venous sinuses: Patent  Anatomic variants: Negative for cerebral aneurysm  Delayed phase: Normal enhancement on delayed imaging  IMPRESSION: Carotid and vertebral arteries are patent in the neck without significant stenosis  Occluded right M1 segment as noted on recent MRA.  Right vertebral artery ends in PICA which is a normal variant. Note is made of acute infarct in the right medulla in the right and PICA territory.  Thyroid nodule.  Recommend thyroid ultrasound   Electronically Signed   By: Franchot Gallo M.D.   On: 08/19/2014 10:32   Ct Angio Neck W/cm &/or Wo/cm  08/19/2014   CLINICAL DATA:  Stroke. Nausea vomiting. Right facial numbness. Diabetes and hypertension and hyperlipidemia  EXAM: CT ANGIOGRAPHY HEAD AND NECK  TECHNIQUE: Multidetector CT imaging of the head and neck was performed using the standard protocol during bolus administration of intravenous contrast. Multiplanar CT image reconstructions and MIPs were obtained to evaluate the vascular anatomy. Carotid stenosis measurements (when applicable) are obtained utilizing NASCET criteria, using the distal internal carotid diameter as the denominator.  CONTRAST:  15m OMNIPAQUE IOHEXOL 350 MG/ML SOLN  COMPARISON:  MRI and MRA head 08/18/2014  FINDINGS: CT HEAD  Brain: MRI revealed small acute infarct in the right posterior lateral medulla, not visualized on CT. No acute infarct identified on CT. Ventricle size normal. Mild hyperdensity in the frontal periventricular white matter bilaterally likely due to mild calcification. No chronic infarct. Negative for hemorrhage or mass.  Calvarium and skull base: Negative  Paranasal sinuses: Negative  Orbits: Negative  CTA NECK  Aortic arch: Normal aortic arch. Left carotid artery origin from the innominate artery is a normal variant. Proximal great vessels widely patent.  Extensive reticular of density in the lung apices bilaterally compatible with pulmonary fibrosis.  Right carotid system:  Right common carotid artery normal. Right carotid bifurcation widely patent. Negative for stenosis or dissection. No significant atherosclerotic disease.  Left carotid system: Left common carotid artery widely patent. Left carotid bifurcation normal. No dissection or stenosis.  Vertebral arteries:Left vertebral artery dominant. Both vertebral arteries are patent without significant stenosis. Right vertebral artery ends in PICA.  Skeleton: Mild cervical spine degenerative change. No acute bony abnormality.  Other neck: Left thyroid 8 mm calcification near the left isthmus. Enlargement of the left lobe of the thyroid with a possible 19 mm nodule on the left. Thyroid ultrasound recommended for further evaluation.  CTA HEAD  Anterior circulation: Mild atherosclerotic disease in the right cavernous carotid with mild stenosis. Right anterior cerebral artery widely patent. Right M1 segment occluded proximally. Right  MCA branches are patent with flow through collaterals.  No significant left carotid stenosis through the cavernous segment. Hypoplastic left A1 segment which is patent. Left middle cerebral artery widely patent.  Posterior circulation: Right vertebral artery ends in PICA. Left vertebral artery contributes to the basilar. PICA patent bilaterally. Basilar patent. Superior cerebellar and posterior cerebral arteries patent. Hypoplastic left P1 segment. Fetal origin of the left posterior cerebral artery.  Venous sinuses: Patent  Anatomic variants: Negative for cerebral aneurysm  Delayed phase: Normal enhancement on delayed imaging  IMPRESSION: Carotid and vertebral arteries are patent in the neck without significant stenosis  Occluded right M1 segment as noted on recent MRA.  Right vertebral artery ends in PICA which is a normal variant. Note is made of acute infarct in the right medulla in the right and PICA territory.  Thyroid nodule.  Recommend thyroid ultrasound   Electronically Signed   By: Franchot Gallo M.D.    On: 08/19/2014 10:32    Assessment/Plan: Diagnosis: right medullary/inferior cerebellar peduncle infarct 1. Does the need for close, 24 hr/day medical supervision in concert with the patient's rehab needs make it unreasonable for this patient to be served in a less intensive setting? No 2. Co-Morbidities requiring supervision/potential complications:   3. Due to bladder management, bowel management, safety, skin/wound care and disease management, does the patient require 24 hr/day rehab nursing? No 4. Does the patient require coordinated care of a physician, rehab nurse, PT, OT to address physical and functional deficits in the context of the above medical diagnosis(es)? No Addressing deficits in the following areas: balance, endurance and strength 5. Can the patient actively participate in an intensive therapy program of at least 3 hrs of therapy per day at least 5 days per week? No 6. The potential for patient to make measurable gains while on inpatient rehab is fair 7. Anticipated functional outcomes upon discharge from inpatient rehab are n/a  with PT, n/a with OT, n/a with SLP. 8. Estimated rehab length of stay to reach the above functional goals is: n/a 9. Does the patient have adequate social supports and living environment to accommodate these discharge functional goals? Potentially 10. Anticipated D/C setting: Home 11. Anticipated post D/C treatments: [place] therapy 12. Overall Rehab/Functional Prognosis: good  RECOMMENDATIONS: This patient's condition is appropriate for continued rehabilitative care in the following setting: [facility] Therapy Patient has agreed to participate in recommended program. Yes Note that insurance prior authorization may be required for reimbursement for recommended care.  Comment: Pt prefers to go home and is doing well enough that she should be ok with some intermittent assistance which is available at home.   Meredith Staggers, MD, [place] [facility] 08/21/2014     08/21/2014

## 2014-08-21 NOTE — Progress Notes (Signed)
Hydralazine was ordered and will continue to monitor patient. Melina Schoolshompson, Jaymes Hang E, [place] 08/21/2014 5:31 AM

## 2014-08-21 NOTE — Progress Notes (Signed)
OT Cancellation Note  Patient Details Name: Michelle BoettcherWanda D Brooks MRN: [id number] DOB: 01/16/1966   Cancelled Treatment:    Reason Eval/Treat Not Completed: Other (comment) (Pt c/o nausea. Requests OT to reattempt.) Pt reports nausea, "I keep burping and I feel like I'm about to throw up." Pt attributes nausea to taking Potasium pills, "I felt ok until I took them." Nursing aware. OT to reattempt as schedule permits.   Pilar GrammesMathews, Alexy Heldt H 08/21/2014, 11:17 AM

## 2014-08-21 NOTE — Progress Notes (Signed)
Patient's BP was 180/106 and she is asymptomatic, Paged MD will continue to monitor. Suzy Bouchardhompson, Lashun Ramseyer E, [place] 08/21/2014 [phone]

## 2014-08-21 NOTE — Progress Notes (Signed)
Rehab admissions - I met with patient and her boyfriend/fiance.  Patient prefers to discharge directly home and not come to inpatient rehab.  She has no insurance.  She has her boyfriend, a 49 yo daughter and a father who can assist at home.  Patient wants to speak with a Development worker, community and a Education officer, museum regarding potential medicaid.  Call me for questions.  #[phone]

## 2014-08-22 LAB — GLUCOSE, CAPILLARY
Glucose-Capillary: 178 mg/dL — ABNORMAL HIGH (ref 65–99)
Glucose-Capillary: 263 mg/dL — ABNORMAL HIGH (ref 65–99)

## 2014-08-22 LAB — BASIC METABOLIC PANEL WITH GFR
Anion gap: 10 (ref 5–15)
BUN: 9 mg/dL (ref 6–20)
CO2: 25 mmol/L (ref 22–32)
Calcium: 9.6 mg/dL (ref 8.9–10.3)
Chloride: 100 mmol/L — ABNORMAL LOW (ref 101–111)
Creatinine, Ser: 0.94 mg/dL (ref 0.44–1.00)
GFR calc Af Amer: >60 mL/min
GFR calc non Af Amer: >60 mL/min
Glucose, Bld: 156 mg/dL — ABNORMAL HIGH (ref 65–99)
Potassium: 3.5 mmol/L (ref 3.5–5.1)
Sodium: 135 mmol/L (ref 135–145)

## 2014-08-22 LAB — CBC
HCT: 44 % (ref 36.0–46.0)
Hemoglobin: 15.1 g/dL — ABNORMAL HIGH (ref 12.0–15.0)
MCH: 28.3 pg (ref 26.0–34.0)
MCHC: 34.3 g/dL (ref 30.0–36.0)
MCV: 82.4 fL (ref 78.0–100.0)
Platelets: 349 10*3/uL (ref 150–400)
RBC: 5.34 MIL/uL — ABNORMAL HIGH (ref 3.87–5.11)
RDW: 13.1 % (ref 11.5–15.5)
WBC: 9.1 10*3/uL (ref 4.0–10.5)

## 2014-08-22 LAB — HEMOGLOBIN A1C
Hgb A1c MFr Bld: 12.4 % — ABNORMAL HIGH (ref 4.8–5.6)
Mean Plasma Glucose: 309 mg/dL

## 2014-08-22 MED ORDER — ASPIRIN 81 MG PO TABS: 81.0000 mg | ORAL_TABLET | Freq: Every day | ORAL | Status: DC

## 2014-08-22 MED ORDER — CLOPIDOGREL BISULFATE 75 MG PO TABS
75.0000 mg | ORAL_TABLET | Freq: Every day | ORAL | Status: DC
Start: 2014-08-22 — End: 2014-09-15

## 2014-08-22 NOTE — Progress Notes (Signed)
Physical Therapy Treatment Patient Details Name: Michelle Brooks MRN: [id number] DOB: 1965/06/08 Today's Date: 08/22/2014    History of Present Illness 49 y.o. female history diabetes mellitus, hypertension, cerebral infarction, hyperlipidemia and peripheral neuropathy, presenting with right facial numbness and weakness on her right side with tendency to stumble towards the right when she walks. MRI of the brain showed acute infarct in the right lateral medulla and right inferior cerebellar peduncle.    PT Comments    Patient seen for mobility and stair negotiation in preparation for discharge home. Patient able to perform full flight of steps with min guard min assist (hand held assist and use of rail) no significant issues. Educated patient and fiance on technique for safety. Patient receptive. Patient is declining follow up therapy stating that she will have adequate level of assist at home.    Follow Up Recommendations  (declining follow up, states will have assist at home)     Equipment Recommendations  Rolling walker with 5" wheels    Recommendations for Other Services Rehab consult;OT consult     Precautions / Restrictions Precautions Precautions: Fall Restrictions Weight Bearing Restrictions: No    Mobility  Bed Mobility               General bed mobility comments: Pt in chair upon arrival  Transfers Overall transfer level: Needs assistance Equipment used: 1 person hand held assist Transfers: Sit to/from Stand Sit to Stand: Min guard         General transfer comment: VCs for hand placement upon elevating to upright positioning from recliner  Ambulation/Gait Ambulation/Gait assistance: Min assist Ambulation Distance (Feet): 8 Feet Assistive device: 1 person hand held assist Gait Pattern/deviations: Step-through pattern;Decreased stride length Gait velocity: decreased Gait velocity interpretation: Below normal speed for age/gender General Gait Details:  hand held assist to ambulate from chair to stair rail. Min assist with cues for upright posture   Stairs Stairs: Yes Stairs assistance: Min assist Stair Management: Step to pattern;One rail Left;Forwards Number of Stairs: 13 General stair comments: patient able to perform stair negotaiton with min guard min assist (hand held assist on right) no difficulties, tolerated full flight  Wheelchair Mobility    Modified Rankin (Stroke Patients Only)       Balance Overall balance assessment: Needs assistance Sitting-balance support: Feet supported Sitting balance-Leahy Scale: Good     Standing balance support: Single extremity supported   Standing balance comment: able to static stand with LUE supported on stair rail                    Cognition Arousal/Alertness: Awake/alert Behavior During Therapy: WFL for tasks assessed/performed Overall Cognitive Status: Within Functional Limits for tasks assessed                      Exercises      General Comments General comments (skin integrity, edema, etc.): Discussed d/c planning, safety with ADLs at home and energy conservation techniques.       Pertinent Vitals/Pain Pain Assessment: No/denies pain    Home Living Family/patient expects to be discharged to:: Private residence Living Arrangements: Spouse/significant other;Children;Parent Available Help at Discharge: Family;Available 24 hours/day Type of Home: Apartment Home Access: Stairs to enter Entrance Stairs-Rails: Left Home Layout: One level Home Equipment: Cane - quad;Bedside commode;Walker - 2 wheels      Prior Function Level of Independence: Needs assistance  Gait / Transfers Assistance Needed: uses quad cane for mobility ADL's / Homemaking  Assistance Needed: spouse assist with ADLs due to weakness and back pain Comments: Pt does not drive; spouse, father, daughter all live with Pt and can provide transportation   PT Goals (current goals can now be  found in the care plan section) Acute Rehab PT Goals Patient Stated Goal: go home PT Goal Formulation: With patient/family Time For Goal Achievement: 09/02/14 Potential to Achieve Goals: Good Progress towards PT goals: Progressing toward goals    Frequency  Min 4X/week    PT Plan Current plan remains appropriate;Equipment recommendations need to be updated    Co-evaluation             End of Session Equipment Utilized During Treatment: Gait belt Activity Tolerance: Patient tolerated treatment well Patient left: in chair;with call bell/phone within reach;with family/visitor present     Time: [id number] PT Time Calculation (min) (ACUTE ONLY): 15 min  Charges:  $Self Care/Home Management: 8-22                    G CodesFabio Asa:      Naome Brigandi J 08/22/2014, 11:03 AM Charlotte Crumbevon Jaiah Weigel, PT DPT  [phone]

## 2014-08-22 NOTE — Evaluation (Signed)
Speech Language Pathology Evaluation Patient Details Name: Michelle BoettcherWanda D Brooks MRN: [id number] DOB: 02/14/1965 Today's Date: 08/22/2014 Time: [id number] SLP Time Calculation (min) (ACUTE ONLY): 20 min  Problem List:  Patient Active Problem List   Diagnosis Date Noted  . Stroke 08/18/2014  . CVA (cerebral infarction) 08/18/2014  . Hot flashes 12/03/2012  . High risk sexual behavior 05/13/2012  . Chronic low back pain 12/29/2011  . Obesity 10/23/2011  . Financial difficulties 10/23/2011  . Poor dentition 10/23/2011  . Paresthesia of left arm and leg 10/23/2011  . Irregular periods/menstrual cycles 09/12/2011  . Preventative health care 05/13/2011  . Dyslipidemia 04/01/2011  . Hypertension 03/27/2011  . History of stroke 03/13/2010  . Diabetes mellitus type 2, uncontrolled, with complications 02/11/2004   Past Medical History:  Past Medical History  Diagnosis Date  . Diabetes mellitus type 2 in obese   . Hypertension   . History of stroke 03/2010    Right MCA stroke in 03/2010, with MRI also noting subacute strokes in left fronto parietal area - [place] [facility] due to uncontrolled blood pressure // No residual deficits // [facility] - normal LV systolic function, moderate pericardial effusion with 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  [name]. Hyperlipidemia LDL goal < 100 04/01/2011  . Chronic back pain   . Headache   . Diabetic neuropathy   . Diabetic retinopathy    Past Surgical History:  Past Surgical History  Procedure Laterality Date  . Breast lumpectomy  2002    states benign lymph node removed.   HPI:   Michelle Brooks is a 49 y.o. female history diabetes mellitus, hypertension, cerebral infarction, hyperlipidemia and peripheral neuropathy, presenting with right facial numbness and weakness on her right side with tendency to stumble towards the right when she walks. Symptoms  began on 08/14/2014 area she's been taking aspirin daily. MRI of the brain showed small acute stroke involving right lateral brain stem and cerebellum peduncle. There was also diminished to absent flow in the right vertebral artery. Areas of chronic infarction were noted in the right frontal and parietal regions.   Assessment / Plan / Recommendation Clinical Impression   Pt presents with grossly intact cognitive-linguistic function.  She scored a 27/30 on the Mo[employer] standardized assessment of cognitive function; norms are > or = 26.  Pt and family report return to cognitive baseline.  Her speech is fluent and free from word finding impairments or dysarthria.  No focal oral motor weakness was evident upon exam. As a result, no further SLP services are indicated at this time.      SLP Assessment  Patient does not need any further Speech Lanaguage Pathology Services    Follow Up Recommendations  None       Pertinent Vitals/Pain Pain Assessment: No/denies pain   SLP Goals  Patient/Family Stated Goal: none stated   SLP Evaluation Prior Functioning  Cognitive/Linguistic Baseline: Within functional limits Type of Home: Apartment  Lives With: Family Available Help at Discharge: Family;Available 24 hours/day   Cognition  Overall Cognitive Status: Within Functional Limits for tasks assessed Orientation Level: Oriented X4 Attention: Alternating Alternating Attention: Appears intact Memory: Appears intact Awareness: Appears intact Problem Solving: Appears intact Safety/Judgment: Appears intact    Comprehension  Auditory Comprehension Overall Auditory Comprehension: Appears within functional limits for tasks assessed    Expression Expression Primary Mode of Expression: Verbal Verbal Expression Overall Verbal  Expression: Appears within functional limits for tasks assessed Written Expression Dominant Hand: Right   Oral / Motor Oral Motor/Sensory Function Overall Oral Motor/Sensory  Function: Appears within functional limits for tasks assessed Motor Speech Overall Motor Speech: Appears within functional limits for tasks assessed   GO     Maryjane Hurter 08/22/2014, 9:35 AM

## 2014-08-22 NOTE — Progress Notes (Signed)
Patient being discharged home with family IV removed, prescriptions provided, front wheel walker was brought to her vitals are stable no complaints of pain, will take her to car in wheel chair.

## 2014-08-22 NOTE — Evaluation (Signed)
Occupational Therapy Evaluation Patient Details Name: Michelle Brooks MRN: [id number] DOB: 1965/04/22 Today's Date: 08/22/2014    History of Present Illness 49 y.o. female history diabetes mellitus, hypertension, cerebral infarction, hyperlipidemia and peripheral neuropathy, presenting with right facial numbness and weakness on her right side with tendency to stumble towards the right when she walks. MRI of the brain showed acute infarct in the right lateral medulla and right inferior cerebellar peduncle.   Clinical Impression   Pt reports general weakness in R UE and LE and mild facial numbness; no other sensation or vision deficits reported. Pt completed ADLs standing at sink, requiring 2 rest breaks. Pt reports family is available to assist with ADLs as needed; but requests therapy to work on balance and strengthening upon d/c. Education provided to Pt and spouse on energy conservation and fall prevention at home.    Follow Up Recommendations  Outpatient OT;Supervision/Assistance - 24 hour    Equipment Recommendations  None recommended by OT    Recommendations for Other Services       Precautions / Restrictions Precautions Precautions: Fall Restrictions Weight Bearing Restrictions: No      Mobility Bed Mobility               General bed mobility comments: Pt in chair upon arrival  Transfers Overall transfer level: Needs assistance Equipment used: 1 person hand held assist Transfers: Sit to/from Stand Sit to Stand: Min guard         General transfer comment: VCs for hand placement upon elevating to upright positioning from recliner    Balance Overall balance assessment: Needs assistance Sitting-balance support: Feet supported Sitting balance-Leahy Scale: Good     Standing balance support: Single extremity supported   Standing balance comment: able to static stand with LUE supported on stair rail                            ADL Overall ADL's :  Needs assistance/impaired Eating/Feeding: Independent;Sitting   Grooming: Wash/dry hands;Wash/dry face;Oral care;Min guard;Standing Grooming Details (indicate cue type and reason): Pt required 1 rest break during ADLs Upper Body Bathing: Supervision/ safety;Sitting   Lower Body Bathing: Minimal assistance;With caregiver independent assisting;Sit to/from stand Lower Body Bathing Details (indicate cue type and reason): Pt reports spouse A with bathing at home due to weakness Upper Body Dressing : Independent;Sitting   Lower Body Dressing: Supervision/safety;Sit to/from stand   Toilet Transfer: Supervision/safety;Ambulation;Regular Toilet;RW   Toileting- Clothing Manipulation and Hygiene: Independent;Sit to/from stand   Tub/ Engineer, structural: Moderate assistance;With caregiver independent assisting;Ambulation;Rolling walker Tub/Shower Transfer Details (indicate cue type and reason): Spouse A with tub transfer for safety Functional mobility during ADLs: Min guard;Rolling walker General ADL Comments: Pt required 2 rest breaks during 15 min ADL session; Pt reports spouse assist with ADLs at home due to weakness from past CVA     Vision Vision Assessment?: Yes Eye Alignment: Within Functional Limits Ocular Range of Motion: Within Functional Limits Alignment/Gaze Preference: Within Defined Limits Tracking/Visual Pursuits: Able to track stimulus in all quads without difficulty Saccades: Within functional limits Convergence: Within functional limits Visual Fields: No apparent deficits   Perception     Praxis      Pertinent Vitals/Pain Pain Assessment: No/denies pain     Hand Dominance Right   Extremity/Trunk Assessment Upper Extremity Assessment Upper Extremity Assessment: Generalized weakness   Lower Extremity Assessment Lower Extremity Assessment: Defer to PT evaluation;Generalized weakness  Communication Communication Communication: No difficulties   Cognition  Arousal/Alertness: Awake/alert Behavior During Therapy: WFL for tasks assessed/performed Overall Cognitive Status: Within Functional Limits for tasks assessed                     General Comments       Exercises       Shoulder Instructions      Home Living Family/patient expects to be discharged to:: Private residence Living Arrangements: Spouse/significant other;Children;Parent Available Help at Discharge: Family;Available 24 hours/day Type of Home: Apartment Home Access: Stairs to enter Entrance Stairs-Number of Steps: 15 Entrance Stairs-Rails: Left Home Layout: One level     Bathroom Shower/Tub: Tub/shower unit Shower/tub characteristics: Engineer, building servicesCurtain Bathroom Toilet: Standard     Home Equipment: Cane - quad;Bedside commode;Walker - 2 wheels      Lives With: Family    Prior Functioning/Environment Level of Independence: Needs assistance  Gait / Transfers Assistance Needed: uses quad cane for mobility ADL's / Homemaking Assistance Needed: spouse assist with ADLs due to weakness and back pain   Comments: Pt does not drive; spouse, father, daughter all live with Pt and can provide transportation    OT Diagnosis: Generalized weakness   OT Problem List: Decreased strength;Decreased activity tolerance;Impaired balance (sitting and/or standing);Decreased safety awareness;Decreased knowledge of use of DME or AE   OT Treatment/Interventions: Self-care/ADL training;Therapeutic exercise;DME and/or AE instruction;Energy conservation;Therapeutic activities;Patient/family education;Balance training    OT Goals(Current goals can be found in the care plan section) Acute Rehab OT Goals Patient Stated Goal: go home OT Goal Formulation: With patient Time For Goal Achievement: 09/05/14 Potential to Achieve Goals: Good  OT Frequency: Min 3X/week   Barriers to D/C:            Co-evaluation              End of Session Equipment Utilized During Treatment: Gait  belt;Rolling walker Nurse Communication: Mobility status  Activity Tolerance: Patient tolerated treatment well Patient left: in chair;with call bell/phone within reach;with family/visitor present   Time: [id number] OT Time Calculation (min): 22 min Charges:    G-Codes:    Marden NobleMary Rebecca Mayola Mcbain 08/22/2014, 12:00 PM

## 2014-08-22 NOTE — Discharge Summary (Signed)
Physician Discharge Summary  Michelle Brooks MGQ:[id number] DOB: 10/01/1965 DOA: 08/18/2014  PCP: Philis Fendt, MD  Admit date: 08/18/2014 Discharge date: 08/22/2014  Time spent: 40 minutes  Recommendations for Outpatient Follow-up:  1. Follow-up with primary care physician within one week. 2. Follow-up with Dr. Erlinda Hong of the neurology in 2 month  Discharge Diagnoses:  Principal Problem:   Stroke Active Problems:   Diabetes mellitus type 2, uncontrolled, with complications   Hypertension   History of stroke   Dyslipidemia   Chronic low back pain   CVA (cerebral infarction)   Discharge Condition: Stable  Diet recommendation: Carbohydrate modified diet  Filed Weights   08/21/14 0800  Weight: 90.175 kg (198 lb 12.8 oz)    History of present illness:  This is a 49 year old female patient with past history of diabetes on insulin metformin and Januvia, hypertension, prior right MCA stroke in 2012, dyslipidemia, chronic low back pain. Patient presented to the hospital today with progressive neurological symptoms. Patient reports that on 7/3 she had low-grade headaches and took aspirin without any relief. She also felt like she was having difficulty eating. No apparent choking or inability to manage saliva. By 7/4 she noticed right facial numbness but did not notice any drooping in her face. Because she did not see drooping in her face she did not think initially this was stroke symptoms but continue to monitor symptoms as they progress. She continue to have persistent right facial numbness and today she had right sided numbness with weakness. She reports that at onset of symptoms on 7/4 she did not have any perceived weakness or numbness in the right side but she did notice significant gait ataxia with drifting and sensation of "falling to the right". She denied any symptoms that would be consistent with a hemianopsia. She reported fleeting symptoms of feeling like she could not catch her breath  as well as a sharp pain in her anterior chest that radiated to her right arm. Because of these symptoms she presented to the ER for evaluation.  Upon arrival to the ER stroke evaluation was begun. Because her symptoms had begun on 7/4 she was out of the window for any acute thrombolytic therapies. MRI of the brain revealed acute subcentimeter infarct of the right lateral medulla and right inferior cerebellar peduncle within the distribution of the right posterior inferior cerebellar artery. There was also diminished or absent flow in the distal right vertebral artery that could represent stenosis or occlusion. There were also areas of chronic infarction in the right hemisphere affecting the frontal and parietal lobes as described. She was afebrile and normotensive at presentation. She was not orthostatic. Her room air saturations were 98%. Laboratory data was unremarkable except for sodium of 133 in setting of serum glucose 375, AST was 14. Troponin was normal, CBC was normal with hemoglobin of 14 and normal platelets. Urinalysis was unremarkable except for glycosuria with glucose greater than 1000, ketones 15, urine specific gravity demonstrated concentrated urine at 1.039. Serum pregnancy was negative. Acute abdominal series revealed no acute processes other than the chest or abdomen.  Hospital Course:   Stroke/history of CVA Patient presented to the hospital with right facial numbness and slurred speech. MRI of the brain showed acute infarct in the right lateral medulla and right inferior cerebellar peduncle. MRA of the brain showed occlusion of right distal vertebral artery, M1 and A1 with high-grade stenosis of P1. Neurology recommended dual antiplatelets with aspirin and Plavix. Follow-up with neurology as outpatient in  2 month.  Diabetes mellitus type 2, uncontrolled, with complications Was on Humulin 50/50 insulin 80 units 3 times a day at home. Was on metformin and Januvia as  well. Hemoglobin A1c is 12.7, correlate with me plasma glucose of 318 indicates poorly controlled diabetes mellitus. Patient was resistant to change her current insulin, reported that Lantus insulin did not work for her before. Discharge on her previous regimen, to follow-up with primary care physician  Hypertension Current blood pressure controlled Lisinopril on HCTZ restarted.  Dyslipidemia Continue preadmission Pravachol  Chronic low back pain Patient also reports history of neuropathy that has contributed to her back pain as well Had been on Neurontin but this caused a skin eruption/rash so this was discontinued  Procedures:  2-D echo and carotid duplex showed no gross abnormalities.  Consultations:  Neurology  Discharge Exam: Filed Vitals:   08/22/14 1006  BP: 124/86  Pulse: 98  Temp: 98.2 F (36.8 C)  Resp: 16   General: Alert and awake, oriented x3, not in any acute distress. HEENT: anicteric sclera, pupils reactive to light and accommodation, EOMI CVS: S1-S2 clear, no murmur rubs or gallops Chest: clear to auscultation bilaterally, no wheezing, rales or rhonchi Abdomen: soft nontender, nondistended, normal bowel sounds, no organomegaly Extremities: no cyanosis, clubbing or edema noted bilaterally Neuro: Cranial nerves II-XII intact, no focal neurological deficits  Discharge Instructions   Discharge Instructions    Ambulatory referral to Neurology    Complete by:  As directed   Pt will follow up with Dr. Erlinda Hong at [facility] in about 2 months. Thanks.     Diet - low sodium heart healthy    Complete by:  As directed      Diet Carb Modified    Complete by:  As directed      Increase activity slowly    Complete by:  As directed           Current Discharge Medication List    START taking these medications   Details  clopidogrel (PLAVIX) 75 MG tablet Take 1 tablet (75 mg total) by mouth daily. Qty: 30 tablet, Refills: 0      CONTINUE these medications which  have CHANGED   Details  aspirin 81 MG tablet Take 1 tablet (81 mg total) by mouth daily.   Associated Diagnoses: Dysuria; Well woman exam; Hot flashes      CONTINUE these medications which have NOT CHANGED   Details  insulin lispro protamine-lispro (HUMALOG 50/50) (50-50) 100 UNIT/ML SUSP injection Inject 80 Units into the skin 3 (three) times daily. Up to 60 depending on cbg   Associated Diagnoses: Dysuria; Well woman exam; Hot flashes    pravastatin (PRAVACHOL) 40 MG tablet Take 1 tablet (40 mg total) by mouth daily. Qty: 30 tablet, Refills: 5    sitaGLIPtin (JANUVIA) 100 MG tablet Take 100 mg by mouth daily.   Associated Diagnoses: Dysuria; Well woman exam; Hot flashes    Blood Glucose Monitoring Suppl (ACCU-CHEK AVIVA PLUS) W/DEVICE KIT 1 kit by Does not apply route 3 (three) times daily before meals. Qty: 1 kit, Refills: 0    gabapentin (NEURONTIN) 300 MG capsule Take 300 mg by mouth 3 (three) times daily.   Associated Diagnoses: Dysuria; Well woman exam; Hot flashes    glucose blood (ACCU-CHEK AVIVA) test strip Use as instructed Qty: 100 each, Refills: 12    Multiple Vitamins-Minerals (MULTIVITAMIN WITH MINERALS) tablet Take 1 tablet by mouth daily.    venlafaxine (EFFEXOR) 37.5 MG tablet Take 37.5  mg by mouth daily.   Associated Diagnoses: Dysuria; Well woman exam; Hot flashes      STOP taking these medications     lisinopril-hydrochlorothiazide (PRINZIDE) 10-12.5 MG per tablet      metFORMIN (GLUCOPHAGE) 500 MG tablet      aspirin EC 325 MG tablet        No Known Allergies Follow-up Information    Follow up with Xu,Jindong, MD. Schedule an appointment as soon as possible for a visit in 2 months.   Specialty:  Neurology   Why:  stroke clinic   Contact information:   [address] [address] [phone]        The results of significant diagnostics from this hospitalization (including imaging, microbiology, ancillary and  laboratory) are listed below for reference.    Significant Diagnostic Studies: Ct Angio Head W/cm &/or Wo Cm  08/19/2014   CLINICAL DATA:  Stroke. Nausea vomiting. Right facial numbness. Diabetes and hypertension and hyperlipidemia  EXAM: CT ANGIOGRAPHY HEAD AND NECK  TECHNIQUE: Multidetector CT imaging of the head and neck was performed using the standard protocol during bolus administration of intravenous contrast. Multiplanar CT image reconstructions and MIPs were obtained to evaluate the vascular anatomy. Carotid stenosis measurements (when applicable) are obtained utilizing NASCET criteria, using the distal internal carotid diameter as the denominator.  CONTRAST:  34m OMNIPAQUE IOHEXOL 350 MG/ML SOLN  COMPARISON:  MRI and MRA head 08/18/2014  FINDINGS: CT HEAD  Brain: MRI revealed small acute infarct in the right posterior lateral medulla, not visualized on CT. No acute infarct identified on CT. Ventricle size normal. Mild hyperdensity in the frontal periventricular white matter bilaterally likely due to mild calcification. No chronic infarct. Negative for hemorrhage or mass.  Calvarium and skull base: Negative  Paranasal sinuses: Negative  Orbits: Negative  CTA NECK  Aortic arch: Normal aortic arch. Left carotid artery origin from the innominate artery is a normal variant. Proximal great vessels widely patent.  Extensive reticular of density in the lung apices bilaterally compatible with pulmonary fibrosis.  Right carotid system: Right common carotid artery normal. Right carotid bifurcation widely patent. Negative for stenosis or dissection. No significant atherosclerotic disease.  Left carotid system: Left common carotid artery widely patent. Left carotid bifurcation normal. No dissection or stenosis.  Vertebral arteries:Left vertebral artery dominant. Both vertebral arteries are patent without significant stenosis. Right vertebral artery ends in PICA.  Skeleton: Mild cervical spine degenerative change.  No acute bony abnormality.  Other neck: Left thyroid 8 mm calcification near the left isthmus. Enlargement of the left lobe of the thyroid with a possible 19 mm nodule on the left. Thyroid ultrasound recommended for further evaluation.  CTA HEAD  Anterior circulation: Mild atherosclerotic disease in the right cavernous carotid with mild stenosis. Right anterior cerebral artery widely patent. Right M1 segment occluded proximally. Right MCA branches are patent with flow through collaterals.  No significant left carotid stenosis through the cavernous segment. Hypoplastic left A1 segment which is patent. Left middle cerebral artery widely patent.  Posterior circulation: Right vertebral artery ends in PICA. Left vertebral artery contributes to the basilar. PICA patent bilaterally. Basilar patent. Superior cerebellar and posterior cerebral arteries patent. Hypoplastic left P1 segment. Fetal origin of the left posterior cerebral artery.  Venous sinuses: Patent  Anatomic variants: Negative for cerebral aneurysm  Delayed phase: Normal enhancement on delayed imaging  IMPRESSION: Carotid and vertebral arteries are patent in the neck without significant stenosis  Occluded right M1 segment as  noted on recent MRA.  Right vertebral artery ends in PICA which is a normal variant. Note is made of acute infarct in the right medulla in the right and PICA territory.  Thyroid nodule.  Recommend thyroid ultrasound   Electronically Signed   By: Franchot Gallo M.D.   On: 08/19/2014 10:32   Ct Angio Neck W/cm &/or Wo/cm  08/19/2014   CLINICAL DATA:  Stroke. Nausea vomiting. Right facial numbness. Diabetes and hypertension and hyperlipidemia  EXAM: CT ANGIOGRAPHY HEAD AND NECK  TECHNIQUE: Multidetector CT imaging of the head and neck was performed using the standard protocol during bolus administration of intravenous contrast. Multiplanar CT image reconstructions and MIPs were obtained to evaluate the vascular anatomy. Carotid stenosis  measurements (when applicable) are obtained utilizing NASCET criteria, using the distal internal carotid diameter as the denominator.  CONTRAST:  38m OMNIPAQUE IOHEXOL 350 MG/ML SOLN  COMPARISON:  MRI and MRA head 08/18/2014  FINDINGS: CT HEAD  Brain: MRI revealed small acute infarct in the right posterior lateral medulla, not visualized on CT. No acute infarct identified on CT. Ventricle size normal. Mild hyperdensity in the frontal periventricular white matter bilaterally likely due to mild calcification. No chronic infarct. Negative for hemorrhage or mass.  Calvarium and skull base: Negative  Paranasal sinuses: Negative  Orbits: Negative  CTA NECK  Aortic arch: Normal aortic arch. Left carotid artery origin from the innominate artery is a normal variant. Proximal great vessels widely patent.  Extensive reticular of density in the lung apices bilaterally compatible with pulmonary fibrosis.  Right carotid system: Right common carotid artery normal. Right carotid bifurcation widely patent. Negative for stenosis or dissection. No significant atherosclerotic disease.  Left carotid system: Left common carotid artery widely patent. Left carotid bifurcation normal. No dissection or stenosis.  Vertebral arteries:Left vertebral artery dominant. Both vertebral arteries are patent without significant stenosis. Right vertebral artery ends in PICA.  Skeleton: Mild cervical spine degenerative change. No acute bony abnormality.  Other neck: Left thyroid 8 mm calcification near the left isthmus. Enlargement of the left lobe of the thyroid with a possible 19 mm nodule on the left. Thyroid ultrasound recommended for further evaluation.  CTA HEAD  Anterior circulation: Mild atherosclerotic disease in the right cavernous carotid with mild stenosis. Right anterior cerebral artery widely patent. Right M1 segment occluded proximally. Right MCA branches are patent with flow through collaterals.  No significant left carotid stenosis  through the cavernous segment. Hypoplastic left A1 segment which is patent. Left middle cerebral artery widely patent.  Posterior circulation: Right vertebral artery ends in PICA. Left vertebral artery contributes to the basilar. PICA patent bilaterally. Basilar patent. Superior cerebellar and posterior cerebral arteries patent. Hypoplastic left P1 segment. Fetal origin of the left posterior cerebral artery.  Venous sinuses: Patent  Anatomic variants: Negative for cerebral aneurysm  Delayed phase: Normal enhancement on delayed imaging  IMPRESSION: Carotid and vertebral arteries are patent in the neck without significant stenosis  Occluded right M1 segment as noted on recent MRA.  Right vertebral artery ends in PICA which is a normal variant. Note is made of acute infarct in the right medulla in the right and PICA territory.  Thyroid nodule.  Recommend thyroid ultrasound   Electronically Signed   By: CFranchot GalloM.D.   On: 08/19/2014 10:32   Mr MJodene NamHead Wo Contrast  08/19/2014   ADDENDUM REPORT: 08/19/2014 02:21  ADDENDUM: Acute findings discussed with and reconfirmed by Dr.Neil KLeonel Ramsayon 08/19/2014 at 2:10am.   Electronically  Signed   By: Elon Alas M.D.   On: 08/19/2014 02:21   08/19/2014   CLINICAL DATA:  Followup small posterior circulation infarcts, possible RIGHT vertebral artery stenosis/occlusion. RIGHT facial numbness, RIGHT body numbness and weakness. History of stroke, diabetes, hypertension.  EXAM: MRA HEAD WITHOUT CONTRAST  TECHNIQUE: Angiographic images of the Circle of Willis were obtained using MRA technique without intravenous contrast.  COMPARISON:  MRI of the brain August 18, 2014  FINDINGS: Anterior circulation: Flow related enhancement of the included cervical, petrous, cavernous and supra clinoid internal carotid arteries. Mild luminal regularity of the carotid siphons with at least moderate stenosis RIGHT anterior genu, low likely did atherosclerosis. Patent anterior communicating  artery. LEFT A1 segment appears occluded, normal appearance the anterior cerebral arteries. Moderate stenosis LEFT M1 segment with patent though, relatively thready LEFT middle cerebral artery. Occluded RIGHT distal M1 segment. Bilateral posterior communicating arteries are present.  Posterior circulation: LEFT vertebral artery is dominant. The RIGHT vertebral artery is irregular with moderate stenosis, predominately terminating in the posterior inferior cerebellar artery, the distal RIGHT vertebral artery patent basilar artery. High-grade stenosis LEFT P1 segment with flow via the posterior communicating artery. Slower flow in LEFT posterior cerebral artery.  IMPRESSION: RIGHT distal vertebral artery occlusion, distal to the posterior-inferior cerebellar artery origin. Moderate irregularity of the proximal RIGHT V4 segment likely representing atherosclerosis.  RIGHT M1 occlusion.  LEFT A1 occlusion, patent anterior cerebral arteries via anterior communicating artery.  High-grade stenosis LEFT P1 with patent posterior communicating arteries bilaterally.  Electronically Signed: By: Elon Alas M.D. On: 08/19/2014 02:10   Mr Brain Wo Contrast (neuro Protocol)  08/18/2014   CLINICAL DATA:  RIGHT-sided facial numbness and leaning to the RIGHT when walking. Symptoms started 4 days ago and have been getting worse. Stroke risk factors include diabetes mellitus, hypertension, history of stroke, and hyperlipidemia.  EXAM: MRI HEAD WITHOUT CONTRAST  TECHNIQUE: Multiplanar, multiecho pulse sequences of the brain and surrounding structures were obtained without intravenous contrast.  COMPARISON:  None.  FINDINGS: There is restricted diffusion involving the RIGHT lateral medulla and RIGHT inferior cerebellar peduncle as seen on DWI image 7 series 4 and image 13 series 5. Findings are consistent with an acute RIGHT posterior inferior cerebellar artery territory infarct. No visible hemorrhage, mass lesion, hydrocephalus,  or extra-axial fluid.  Normal for age cerebral volume. Remote cerebral infarct affects the RIGHT frontal opercular cortex and subcortical white matter. Small subcortical RIGHT parietal lacunar infarct also chronic. No significant white matter disease.  There is diminished or absent flow void in the distal RIGHT vertebral artery which could indicate stenosis or recent occlusion. The LEFT vertebral is patent as is the basilar and both carotids. Difficult to confirm or exclude RIGHT PICA patency on this routine brain MR. MRA intracranial could provide additional information.  Partial empty sella. No tonsillar herniation. Upper cervical region and extracranial soft tissues unremarkable. Suspected intraparotid lymph node on the LEFT is incompletely evaluated.  IMPRESSION: Acute subcentimeter infarct of the RIGHT lateral medulla and RIGHT inferior cerebellar peduncle, within the distribution of the RIGHT posterior-inferior cerebellar artery.  Diminished or absent flow void in the distal RIGHT vertebral artery could represent stenosis or occlusion.  Areas of chronic infarction in the RIGHT hemisphere affecting the frontal and parietal lobes as described. No foci of acute or chronic hemorrhage.   Electronically Signed   By: Staci Righter M.D.   On: 08/18/2014 12:20   Dg Abd Acute W/chest  08/18/2014   CLINICAL DATA:  Right facial numbness and leaning to the right while walking today. History of prior stroke. Initial encounter.  EXAM: DG ABDOMEN ACUTE W/ 1V CHEST  COMPARISON:  None.  FINDINGS: Single view of the chest demonstrates clear lungs and normal heart size. No pneumothorax or pleural effusion.  Two views of the abdomen show no free intraperitoneal air. The bowel gas pattern is normal. No unexpected abdominal calcification is identified.  IMPRESSION: Negative exam.   Electronically Signed   By: Inge Rise M.D.   On: 08/18/2014 12:44    Microbiology: No results found for this or any previous visit (from the  past 240 hour(s)).   Labs: Basic Metabolic Panel:  Recent Labs Lab 08/18/14 1008 08/20/14 0542 08/21/14 0629 08/22/14 0545  NA 133* 133* 133* 135  K 3.8 3.9 3.2* 3.5  CL 98* 102 100* 100*  CO2 _0 GLUCOSE 375* 276* 213* 156*  BUN _1 CREATININE 0.90 0.94 0.81 0.94  CALCIUM 9.3 8.6* 8.9 9.6   Liver Function Tests:  Recent Labs Lab 08/18/14 1008  AST 14*  ALT 14  ALKPHOS 98  BILITOT 0.5  PROT 7.0  ALBUMIN 3.6    Recent Labs Lab 08/18/14 1008  LIPASE 28   No results for input(s): AMMONIA in the last 168 hours. CBC:  Recent Labs Lab 08/18/14 1008 08/22/14 0545  WBC 8.1 9.1  NEUTROABS 5.4  --   HGB 14.1 15.1*  HCT 41.9 44.0  MCV 83.0 82.4  PLT 315 349   Cardiac Enzymes:  Recent Labs Lab 08/18/14 1008  TROPONINI <0.03   BNP: BNP (last 3 results) No results for input(s): BNP in the last 8760 hours.  ProBNP (last 3 results) No results for input(s): PROBNP in the last 8760 hours.  CBG:  Recent Labs Lab 08/21/14 1119 08/21/14 1702 08/21/14 2109 08/22/14 0634 08/22/14 1133  GLUCAP 219* 196* 183* 178* 263*       Signed:  Nivia Gervase A  [facility] 08/22/2014, 12:21 PM

## 2014-08-23 NOTE — Patient Outreach (Signed)
[facility] Care Management  08/23/2014  Michelle BoettcherWanda D Brooks 09/12/1965 [id number]   Patient is eligible for [facility] Transition Stroke series. Outreached to patient and received verbal consent to enroll in these calls.  Calls will begin 08/24/14.

## 2014-09-06 NOTE — Patient Outreach (Signed)
[facility]) Care Management  09/06/2014  Michelle Brooks [birth year] [id number]   RED on EMMI Stroke dashboard, assigned Colleen Can, RN to outreach.  Michelle Brooks. [facility] Care Management [facility] CM Assistant Phone: [phone] Fax: [phone]

## 2014-09-07 ENCOUNTER — Other Ambulatory Visit: Payer: Self-pay | Admitting: *Deleted

## 2014-09-07 NOTE — Patient Outreach (Signed)
[facility]) Care Management  09/07/2014  Michelle Brooks [birth year] [id number]  EMMI-stroke referral: Red on dashboard for no follow up appointment scheduled.  Telephone call to patient who gave HIPPA verification. Patient states she had recent hospital stay from 07/08 thru 08/22/2014 with diagnosis of stroke.   Patient states currently has no insurance; Medicaid is pending.Patient states she is unable to pay primary care office for follow up.   Plan: Will develop care plan. Will set up appointment at [facility] for post hospital follow up & notify patient.  RN CM was able to make appointment for August 5th at [facility]. Patient was notified of appointment.  Plan: will follow up. Colleen Can, RN BSN CCM Care Management Coordinator [facility] Care Management  [phone]

## 2014-09-15 ENCOUNTER — Encounter: Payer: Self-pay | Admitting: Family Medicine

## 2014-09-15 ENCOUNTER — Ambulatory Visit: Payer: Medicaid Other | Attending: Family Medicine | Admitting: Family Medicine

## 2014-09-15 ENCOUNTER — Inpatient Hospital Stay: Payer: Medicaid Other | Admitting: Family Medicine

## 2014-09-15 VITALS — BP 127/86 | HR 84 | Temp 97.9°F | Ht 68.0 in | Wt 253.0 lb

## 2014-09-15 DIAGNOSIS — Z794 Long term (current) use of insulin: Secondary | ICD-10-CM | POA: Insufficient documentation

## 2014-09-15 DIAGNOSIS — E785 Hyperlipidemia, unspecified: Secondary | ICD-10-CM | POA: Diagnosis not present

## 2014-09-15 DIAGNOSIS — E11319 Type 2 diabetes mellitus with unspecified diabetic retinopathy without macular edema: Secondary | ICD-10-CM | POA: Insufficient documentation

## 2014-09-15 DIAGNOSIS — E1140 Type 2 diabetes mellitus with diabetic neuropathy, unspecified: Secondary | ICD-10-CM | POA: Insufficient documentation

## 2014-09-15 DIAGNOSIS — Z87891 Personal history of nicotine dependence: Secondary | ICD-10-CM | POA: Diagnosis not present

## 2014-09-15 DIAGNOSIS — Z8673 Personal history of transient ischemic attack (TIA), and cerebral infarction without residual deficits: Secondary | ICD-10-CM | POA: Insufficient documentation

## 2014-09-15 DIAGNOSIS — M549 Dorsalgia, unspecified: Secondary | ICD-10-CM | POA: Diagnosis not present

## 2014-09-15 DIAGNOSIS — Z7982 Long term (current) use of aspirin: Secondary | ICD-10-CM | POA: Insufficient documentation

## 2014-09-15 DIAGNOSIS — G8929 Other chronic pain: Secondary | ICD-10-CM | POA: Insufficient documentation

## 2014-09-15 DIAGNOSIS — E1165 Type 2 diabetes mellitus with hyperglycemia: Secondary | ICD-10-CM | POA: Insufficient documentation

## 2014-09-15 DIAGNOSIS — Z79899 Other long term (current) drug therapy: Secondary | ICD-10-CM | POA: Insufficient documentation

## 2014-09-15 DIAGNOSIS — K219 Gastro-esophageal reflux disease without esophagitis: Secondary | ICD-10-CM | POA: Insufficient documentation

## 2014-09-15 DIAGNOSIS — I63011 Cerebral infarction due to thrombosis of right vertebral artery: Secondary | ICD-10-CM | POA: Diagnosis not present

## 2014-09-15 DIAGNOSIS — E1149 Type 2 diabetes mellitus with other diabetic neurological complication: Secondary | ICD-10-CM

## 2014-09-15 DIAGNOSIS — I10 Essential (primary) hypertension: Secondary | ICD-10-CM | POA: Diagnosis not present

## 2014-09-15 DIAGNOSIS — I69351 Hemiplegia and hemiparesis following cerebral infarction affecting right dominant side: Secondary | ICD-10-CM | POA: Diagnosis not present

## 2014-09-15 DIAGNOSIS — Z7902 Long term (current) use of antithrombotics/antiplatelets: Secondary | ICD-10-CM | POA: Diagnosis not present

## 2014-09-15 LAB — GLUCOSE, POCT (MANUAL RESULT ENTRY): POC Glucose: 275 mg/dl — AB (ref 70–99)

## 2014-09-15 MED ORDER — GLUCOSE BLOOD VI STRP: ORAL_STRIP | Status: AC

## 2014-09-15 MED ORDER — GLIPIZIDE 5 MG PO TABS: 5.0000 mg | ORAL_TABLET | Freq: Two times a day (BID) | ORAL | Status: DC

## 2014-09-15 MED ORDER — TRUEPLUS LANCETS 30G MISC: 1.0000 | Freq: Three times a day (TID) | Status: DC

## 2014-09-15 MED ORDER — CLOPIDOGREL BISULFATE 75 MG PO TABS: 75.0000 mg | ORAL_TABLET | Freq: Every day | ORAL | Status: DC

## 2014-09-15 MED ORDER — TRUE METRIX METER DEVI: 1.0000 | Freq: Three times a day (TID) | Status: AC

## 2014-09-15 NOTE — Progress Notes (Signed)
Patient following up after 3rd CVA She reports no pain but reports continued right facial numbness still

## 2014-09-15 NOTE — Progress Notes (Signed)
Michelle Brooks, is a 49 y.o. female  LTJ:[id number]  [id number]:[id number]  DOB - 1965-07-16  CC: No chief complaint on file.  Admit date: 08/18/14 Discharge date: 08/22/14  HPI: Michelle Brooks is a 49 y.o. female with a history of uncontrolled type 2 diabetes mellitus (A1c 12.4), hypertension, dyslipidemia, previous history of stroke who presented to [facility] ED with progressively worsening right  facial numbness, right facial weakness, ataxia, drifting to the right.   On presentation she did not receive TPA as she was outside the window for thrombolytic therapy .MRI of the brain revealed acute subcentimeter infarct of the right lateral medulla and right inferior cerebellar peduncle within the distribution of the right posterior inferior cerebellar artery. There was also diminished or absent flow in the distal right vertebral artery that could represent stenosis or occlusion. There were also areas of chronic infarction in the right hemisphere affecting the frontal and parietal lobes as described.She was seen by Neurology and commenced on aspirin and Plavix. She also received some inpatient physical, occupational and speech therapy after which she was discharged.  Interval history: She has no primary care physician but was previously followed by Dr Jeanie Cooks whom she has not seen the last 1 year but states she goes to his office to receive samples for diabetic medications. States she still feels some numbness on the right side of her face and is still weak on the right side of her body; she ambulates sometimes with a cane at other times with a walker. She states she has been compliant with her medications except for Jardiance due to lack of insurance.  No Known Allergies Past Medical History  Diagnosis Date  . Diabetes mellitus type 2 in obese   . Hypertension   . History of stroke 03/2010    Right MCA stroke in 03/2010, with MRI also noting subacute strokes in left fronto parietal area - occured in  [place] received care at [facility] thought due to uncontrolled blood pressure // No residual deficits // TTE (03/2010) at [facility] - normal LV systolic function, moderate pericardial effusion with 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  [name] Hyperlipidemia LDL goal < 100 04/01/2011  . Chronic back pain   . Headache   . Diabetic neuropathy   . Diabetic retinopathy    Current Outpatient Prescriptions on File Prior to Visit  Medication Sig Dispense Refill  . aspirin 81 MG tablet Take 1 tablet (81 mg total) by mouth daily.    . Blood Glucose Monitoring Suppl (ACCU-CHEK AVIVA PLUS) W/DEVICE KIT 1 kit by Does not apply route 3 (three) times daily before meals. (Patient not taking: Reported on 08/18/2014) 1 kit 0  . clopidogrel (PLAVIX) 75 MG tablet Take 1 tablet (75 mg total) by mouth daily. 30 tablet 0  . gabapentin (NEURONTIN) 300 MG capsule Take 300 mg by mouth 3 (three) times daily.    [name] glucose blood (ACCU-CHEK AVIVA) test strip Use as instructed (Patient not taking: Reported on 08/18/2014) 100 each 12  . insulin lispro protamine-lispro (HUMALOG 50/50) (50-50) 100 UNIT/ML SUSP injection Inject 80 Units into the skin 3 (three) times daily. Up to 60 depending on cbg    . Multiple Vitamins-Minerals (MULTIVITAMIN WITH MINERALS) tablet Take 1 tablet by mouth daily.    . pravastatin (PRAVACHOL) 40 MG tablet Take 1 tablet (40 mg total) by mouth daily. 30 tablet 5  . sitaGLIPtin (JANUVIA) 100 MG tablet Take 100 mg by  mouth daily.    [name] venlafaxine (EFFEXOR) 37.5 MG tablet Take 37.5 mg by mouth daily.     No current facility-administered medications on file prior to visit.   Family History  Problem Relation Age of Onset  . Diabetes Mother   . Stroke Mother 99  . Diabetes Brother   . Diabetes Brother   . Cancer Brother 64    unknown type  . Hypertension Brother    History   Social History  . Marital Status: Divorced    Spouse Name: N/A  . Number  of Children: 3  . Years of Education: 12th grade   Occupational History  . unemployed     previously worked in [place] before her stroke   Social History Main Topics  . Smoking status: Former Smoker -- 0.20 packs/day for 7 years    Types: Cigarettes    Quit date: 03/26/2004  . Smokeless tobacco: Never Used  . Alcohol Use: Yes     Comment: social rarely  . Drug Use: No  . Sexual Activity:    Partners: Male    Birth Control/ Protection: Condom   Other Topics Concern  . Not on file   Social History Narrative   Recently moved from [place] in 10/2010 due to wanting a change from stressors.   Previously followed by Dr. Wynelle Bourgeois at [place]: Constitutional: Negative for fever, chills, diaphoresis, activity change, appetite change and fatigue. HENT: Negative for ear pain, nosebleeds, congestion, facial swelling, rhinorrhea, neck pain, neck stiffness and ear discharge.  Eyes: Negative for pain, discharge, redness, itching and visual disturbance. Respiratory: Negative for cough, choking, chest tightness, shortness of breath, wheezing and stridor.  Cardiovascular: Negative for chest pain, palpitations and leg swelling. Gastrointestinal: Negative for abdominal distention. Genitourinary: Negative for dysuria, urgency, frequency, hematuria, flank pain, decreased urine volume, difficulty urinating and dyspareunia.  Musculoskeletal: Negative for back pain, joint swelling, arthralgia and gait problem. Neurological: Negative for dizziness, tremors, seizures, syncope, facial asymmetry, speech difficulty, weakness, light-headedness, positive for numbness on right side of face, negative for headaches.  Hematological: Negative for adenopathy. Does not bruise/bleed easily. Skin: Negative for rash, ulcer. Psychiatric/Behavioral: Negative for hallucinations, behavioral problems, confusion, dysphoric mood, decreased concentration and agitation.    Objective:   Physical  Exam: Constitutional: Obese HENT: Normocephalic, atraumatic, External right and left ear normal. Oropharynx is clear and moist.  Eyes: Conjunctivae and EOM are normal. PERRLA, no scleral icterus. Neck: Normal ROM, No JVD. No tracheal deviation. No thyromegaly. CVS: RRR, S1/S2 +, no murmurs, no gallops, no carotid bruit.  Pulmonary: Effort and breath sounds normal, no stridor, rhonchi, wheezes, rales.  Abdominal: Soft. BS +, no distension, tenderness, rebound or guarding.  Musculoskeletal: Normal range of motion. No edema and no tenderness.  Lymphadenopathy: No lymphadenopathy noted, cervical, inguinal or axillary Neuro: Alert. Cranial nerves intact. Motor strength is 5/5 in left upper and left lower extremity, motor strength is 4+/5 in right upper extremity and 4/5 in right lower extremity. Skin: Skin is warm and dry. No rash noted. Not diaphoretic. No erythema. No pallor. Psychiatric: Normal mood and affect. Behavior, judgment, thought content normal.  Lab Results  Component Value Date   WBC 9.1 08/22/2014   HGB 15.1* 08/22/2014   HCT 44.0 08/22/2014   MCV 82.4 08/22/2014   PLT 349 08/22/2014   Lab Results  Component Value Date   CREATININE 0.94 08/22/2014   BUN 9 08/22/2014   NA 135  08/22/2014   K 3.5 08/22/2014   CL 100* 08/22/2014   CO2 25 08/22/2014    Lab Results  Component Value Date   HGBA1C 12.4* 08/21/2014   Lipid Panel     Component Value Date/Time   CHOL 174 08/19/2014 0500   TRIG 348* 08/19/2014 0500   HDL 26* 08/19/2014 0500   CHOLHDL 6.7 08/19/2014 0500   VLDL 70* 08/19/2014 0500   LDLCALC 78 08/19/2014 0500   EXAM: MRI HEAD WITHOUT CONTRAST  TECHNIQUE: Multiplanar, multiecho pulse sequences of the brain and surrounding structures were obtained without intravenous contrast.  COMPARISON: None.  FINDINGS: There is restricted diffusion involving the RIGHT lateral medulla and RIGHT inferior cerebellar peduncle as seen on DWI image 7 series 4  and image 13 series 5. Findings are consistent with an acute RIGHT posterior inferior cerebellar artery territory infarct. No visible hemorrhage, mass lesion, hydrocephalus, or extra-axial fluid.  Normal for age cerebral volume. Remote cerebral infarct affects the RIGHT frontal opercular cortex and subcortical white matter. Small subcortical RIGHT parietal lacunar infarct also chronic. No significant white matter disease.  There is diminished or absent flow void in the distal RIGHT vertebral artery which could indicate stenosis or recent occlusion. The LEFT vertebral is patent as is the basilar and both carotids. Difficult to confirm or exclude RIGHT PICA patency on this routine brain MR. MRA intracranial could provide additional information.  Partial empty sella. No tonsillar herniation. Upper cervical region and extracranial soft tissues unremarkable. Suspected intraparotid lymph node on the LEFT is incompletely evaluated.  IMPRESSION: Acute subcentimeter infarct of the RIGHT lateral medulla and RIGHT inferior cerebellar peduncle, within the distribution of the RIGHT posterior-inferior cerebellar artery.  Diminished or absent flow void in the distal RIGHT vertebral artery could represent stenosis or occlusion.  Areas of chronic infarction in the RIGHT hemisphere affecting the frontal and parietal lobes as described. No foci of acute or chronic hemorrhage.   Electronically Signed  By: Staci Righter M.D.  On: 08/18/2014 12:20      EXAM: MRA HEAD WITHOUT CONTRAST  TECHNIQUE: Angiographic images of the Circle of Willis were obtained using MRA technique without intravenous contrast.  COMPARISON: MRI of the brain August 18, 2014  FINDINGS: Anterior circulation: Flow related enhancement of the included cervical, petrous, cavernous and supra clinoid internal carotid arteries. Mild luminal regularity of the carotid siphons with at least moderate stenosis  RIGHT anterior genu, low likely did atherosclerosis. Patent anterior communicating artery. LEFT A1 segment appears occluded, normal appearance the anterior cerebral arteries. Moderate stenosis LEFT M1 segment with patent though, relatively thready LEFT middle cerebral artery. Occluded RIGHT distal M1 segment. Bilateral posterior communicating arteries are present.  Posterior circulation: LEFT vertebral artery is dominant. The RIGHT vertebral artery is irregular with moderate stenosis, predominately terminating in the posterior inferior cerebellar artery, the distal RIGHT vertebral artery patent basilar artery. High-grade stenosis LEFT P1 segment with flow via the posterior communicating artery. Slower flow in LEFT posterior cerebral artery.  IMPRESSION: RIGHT distal vertebral artery occlusion, distal to the posterior-inferior cerebellar artery origin. Moderate irregularity of the proximal RIGHT V4 segment likely representing atherosclerosis.  RIGHT M1 occlusion.  LEFT A1 occlusion, patent anterior cerebral arteries via anterior communicating artery.  High-grade stenosis LEFT P1 with patent posterior communicating arteries bilaterally.  Electronically Signed: By: Elon Alas M.D. On: 08/19/2014 02:10    Assessment and plan:  49 year old female with a history of uncontrolled type 2 diabetes mellitus (A1c 12.4), hypertension, dyslipidemia, recently hospitalized for a stroke.  UnControlled type 2 diabetes mellitus: A1c of 12.4. She insists on remaining on her Humalog 50/50 as she is on a program with her PCP where she receives medications for a year and is reluctant to trying Lantus because she states it did not work for her previously. Has been out of Jardiance which I am replacing with glipizide. Continue metformin, Januvia and Humalog and I will review her blood sugar log at next visit Prescription for new testing supplies written and sent to the pharmacy on  site  Diabetic Neuropathy: Remains on Gabapentin  GERD: Controlled on Pantoprazole.  CVA: With residual right sided weakness. Hypercoagulable panel sent off given history of 2 strokes in the young patient. She denies a family history of hypercoagulable disorder but endorses a history of stroke in her mom. High risk for falls; ambulates with the aid of a walker. Continue aspirin and Plavix; recommendation at discharge was for neurology follow-up at 2 months We'll place her for a next visit  Hypertension: Controlled. Continue antihypertensives  Dyslipidemia: Continue statin.   She expresses her desire to return back to her PCP Dr Jeanie Cooks once her Medicaid is reinstated but for now she will use the [facility].    The patient was given clear instructions to go to ER or return to medical center if symptoms don't improve, worsen or new problems develop. The patient verbalized understanding. The patient was told to call to get lab results if they haven't heard anything in the next week.     Arnoldo Morale, [facility] [facility] [phone] 09/15/2014, 2:50 PM

## 2014-09-15 NOTE — Patient Instructions (Signed)
Diabetes Mellitus and Food It is important for you to manage your blood sugar (glucose) level. Your blood glucose level can be greatly affected by what you eat. Eating healthier foods in the appropriate amounts throughout the day at about the same time each day will help you control your blood glucose level. It can also help slow or prevent worsening of your diabetes mellitus. Healthy eating may even help you improve the level of your blood pressure and reach or maintain a healthy weight.  HOW CAN FOOD AFFECT ME? Carbohydrates Carbohydrates affect your blood glucose level more than any other type of food. Your dietitian will help you determine how many carbohydrates to eat at each meal and teach you how to count carbohydrates. Counting carbohydrates is important to keep your blood glucose at a healthy level, especially if you are using insulin or taking certain medicines for diabetes mellitus. Alcohol Alcohol can cause sudden decreases in blood glucose (hypoglycemia), especially if you use insulin or take certain medicines for diabetes mellitus. Hypoglycemia can be a life-threatening condition. Symptoms of hypoglycemia (sleepiness, dizziness, and disorientation) are similar to symptoms of having too much alcohol.  If your health care provider has given you approval to drink alcohol, do so in moderation and use the following guidelines:  Women should not have more than one drink per day, and men should not have more than two drinks per day. One drink is equal to:  12 oz of beer.  5 oz of wine.  1 oz of hard liquor.  Do not drink on an empty stomach.  Keep yourself hydrated. Have water, diet soda, or unsweetened iced tea.  Regular soda, juice, and other mixers might contain a lot of carbohydrates and should be counted. WHAT FOODS ARE NOT RECOMMENDED? As you make food choices, it is important to remember that all foods are not the same. Some foods have fewer nutrients per serving than other  foods, even though they might have the same number of calories or carbohydrates. It is difficult to get your body what it needs when you eat foods with fewer nutrients. Examples of foods that you should avoid that are high in calories and carbohydrates but low in nutrients include:  Trans fats (most processed foods list trans fats on the Nutrition Facts label).  Regular soda.  Juice.  Candy.  Sweets, such as cake, pie, doughnuts, and cookies.  Fried foods. WHAT FOODS CAN I EAT? Have nutrient-rich foods, which will nourish your body and keep you healthy. The food you should eat also will depend on several factors, including:  The calories you need.  The medicines you take.  Your weight.  Your blood glucose level.  Your blood pressure level.  Your cholesterol level. You also should eat a variety of foods, including:  Protein, such as meat, poultry, fish, tofu, nuts, and seeds (lean animal proteins are best).  Fruits.  Vegetables.  Dairy products, such as milk, cheese, and yogurt (low fat is best).  Breads, grains, pasta, cereal, rice, and beans.  Fats such as olive oil, trans fat-free margarine, canola oil, avocado, and olives. DOES EVERYONE WITH DIABETES MELLITUS HAVE THE SAME MEAL PLAN? Because every person with diabetes mellitus is different, there is not one meal plan that works for everyone. It is very important that you meet with a dietitian who will help you create a meal plan that is just right for you. Document Released: 10/24/2004 Document Revised: 02/01/2013 Document Reviewed: 12/24/2012 Exit[employer] Patient Information 2015 [employer]. This   information is not intended to replace advice given to you by your health care provider. Make sure you discuss any questions you have with your health care provider.  

## 2014-09-18 ENCOUNTER — Other Ambulatory Visit: Payer: Self-pay | Admitting: Pharmacist

## 2014-09-18 LAB — RFX PTT-LA W/RFX TO HEX PHASE CONF: PTT-LA SCREEN: 25 s (ref <=40)

## 2014-09-18 LAB — RFX DRVVT SCR W/RFLX CONF 1:1 MIX: DRVVT SCREEN: 32 s (ref <=45)

## 2014-09-18 MED ORDER — TRUEPLUS LANCETS 28G MISC: 1.0000 | Freq: Three times a day (TID) | Status: AC

## 2014-09-19 LAB — HYPERCOAGULABLE PANEL, COMPREHENSIVE
AntiThromb III Func: 108 % activity (ref 80–120)
Anticardiolipin IgA: 2 APL U/mL (ref <22)
Anticardiolipin IgG: 7 GPL U/mL (ref <23)
Anticardiolipin IgM: 0 MPL U/mL (ref <11)
Beta-2 Glyco I IgG: 0 G Units (ref <20)
Beta-2-Glycoprotein I IgA: 5 A Units (ref <20)
Beta-2-Glycoprotein I IgM: 3 M Units (ref <20)
PROTEIN C ANTIGEN: 135 % (ref 70–140)
PROTEIN S ANTIGEN, TOTAL: 126 % (ref 70–140)
Protein C Activity: 187 % — ABNORMAL HIGH (ref 70–180)
Protein S Activity: 130 % (ref 60–140)

## 2014-09-22 ENCOUNTER — Ambulatory Visit: Payer: Medicaid Other | Admitting: Family Medicine

## 2014-09-22 ENCOUNTER — Other Ambulatory Visit: Payer: Self-pay | Admitting: *Deleted

## 2014-09-22 NOTE — Patient Outreach (Signed)
[facility]) Care Management  09/22/2014  Michelle Brooks 04/29/1965 [id number]  Emmi-stroke follow up. Telephone call to patient who advised that she had follow up appointment at [facility] 09/15/2014.  States she has no new symptoms of stroke but continues to have right sided weakness. Continues to use walker or cane and has not had any falls. No hospital admissions since out last contact.   States able to get needed medications at [facility] and takes as directed by doctor.   Plan: continue to follow care plan as noted. Will follow up with patient and appointment agreed upon.  Colleen Can, RN BSN CCM Care Management Coordinator [facility] Care Management  [phone]

## 2014-09-28 ENCOUNTER — Other Ambulatory Visit: Payer: Self-pay | Admitting: *Deleted

## 2014-09-28 NOTE — Patient Outreach (Signed)
[facility]) Care Management  09/28/2014  BETTE BRIENZA [birth year] [id number]  Emmi-Stroke follow up: Telephone call to patient who voices that she has follow up appointment with [facility]-10/05/2014. States she plans to keep appointment and has transportation.  States she is taken medications as prescribed. States no new stroke symptoms. States she still has some numbness to face and right sided weakness but that both are improving.  Continues to use cane or walker and has not had any falls.   Patient advised of falls prevention strategies. Patient voices understanding,  Plan: update care plan as noted. Patient agrees with follow telephonically.  Colleen Can, RN BSN CCM Care Management Coordinator [facility] Care Management  [phone]

## 2014-10-05 ENCOUNTER — Encounter: Payer: Self-pay | Admitting: Family Medicine

## 2014-10-05 ENCOUNTER — Ambulatory Visit: Payer: Medicaid Other | Attending: Family Medicine | Admitting: Family Medicine

## 2014-10-05 VITALS — BP 110/77 | HR 78 | Temp 98.3°F | Resp 18 | Ht 67.0 in | Wt 260.0 lb

## 2014-10-05 DIAGNOSIS — E1149 Type 2 diabetes mellitus with other diabetic neurological complication: Secondary | ICD-10-CM

## 2014-10-05 DIAGNOSIS — I10 Essential (primary) hypertension: Secondary | ICD-10-CM

## 2014-10-05 DIAGNOSIS — IMO0002 Reserved for concepts with insufficient information to code with codable children: Secondary | ICD-10-CM

## 2014-10-05 DIAGNOSIS — G8190 Hemiplegia, unspecified affecting unspecified side: Secondary | ICD-10-CM

## 2014-10-05 DIAGNOSIS — E1165 Type 2 diabetes mellitus with hyperglycemia: Secondary | ICD-10-CM

## 2014-10-05 DIAGNOSIS — K219 Gastro-esophageal reflux disease without esophagitis: Secondary | ICD-10-CM

## 2014-10-05 DIAGNOSIS — Z8673 Personal history of transient ischemic attack (TIA), and cerebral infarction without residual deficits: Secondary | ICD-10-CM

## 2014-10-05 DIAGNOSIS — E118 Type 2 diabetes mellitus with unspecified complications: Secondary | ICD-10-CM

## 2014-10-05 DIAGNOSIS — G8191 Hemiplegia, unspecified affecting right dominant side: Secondary | ICD-10-CM | POA: Insufficient documentation

## 2014-10-05 LAB — GLUCOSE, POCT (MANUAL RESULT ENTRY): POC Glucose: 179 mg/dl — AB (ref 70–99)

## 2014-10-05 MED ORDER — CLOTRIMAZOLE-BETAMETHASONE 1-0.05 % EX LOTN: TOPICAL_LOTION | Freq: Two times a day (BID) | CUTANEOUS | Status: DC

## 2014-10-05 NOTE — Progress Notes (Signed)
Subjective:    Patient ID: Michelle Brooks, female    DOB: 12-Mar-1965, 49 y.o.   MRN: [id number]  HPI Michelle Brooks is a 49 year old female with a history of uncontrolled type 2 diabetes mellitus (A1c 12.4), hypertension, dyslipidemia, previous history of stroke who was recently managed at [facility] for a CVA with R hemiparesis from 08/18/14-08/22/14  She had presented to [facility] ED with progressively worsening right  facial numbness, right facial weakness, ataxia, drifting to the right.  On presentation she did not receive TPA as she was outside the window for thrombolytic therapy .MRI of the brain revealed acute subcentimeter infarct of the right lateral medulla and right inferior cerebellar peduncle within the distribution of the right posterior inferior cerebellar artery. There was also diminished or absent flow in the distal right vertebral artery that could represent stenosis or occlusion. There were also areas of chronic infarction in the right hemisphere affecting the frontal and parietal lobes as described.She was seen by Neurology and commenced on aspirin and Plavix.  Interval history: At her last office visit she was commenced on glipizide for better glycemic control as she was unable to obtain Jardiance which she previously received from her PCP. She has not been taking Januvia even though it appears on her medication list because she has been unable to obtain it. Blood sugar log reveals fasting blood sugars in the range of 80-130 and random blood sugars less than 200; she denies hypoglycemic episodes. Still complains of persisting right facial numbness and weakness in right extremities; ambulates with the aid of a walker and a cane. Also request refill on her topical steroid which she uses for a pruritic rash on her anterior chest wall.  Past Medical History  Diagnosis Date  . Diabetes mellitus type 2 in obese   . Hypertension   . History of stroke 03/2010    Right MCA  stroke in 03/2010, with MRI also noting subacute strokes in left fronto parietal area - occured in [place] received care at [facility] thought due to uncontrolled blood pressure // No residual deficits // TTE (03/2010) at [facility] - normal LV systolic function, moderate pericardial effusion with 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  [name] Hyperlipidemia LDL goal < 100 04/01/2011  . Chronic back pain   . Headache   . Diabetic neuropathy   . Diabetic retinopathy   . Stroke     Past Surgical History  Procedure Laterality Date  . Breast lumpectomy  2002    states benign lymph node removed.    Social History   Social History  . Marital Status: Divorced    Spouse Name: N/A  . Number of Children: 3  . Years of Education: 12th grade   Occupational History  . unemployed     previously worked in [place] before her stroke   Social History Main Topics  . Smoking status: Former Smoker -- 0.20 packs/day for 7 years    Types: Cigarettes    Quit date: 03/26/2004  . Smokeless tobacco: Never Used  . Alcohol Use: No  . Drug Use: No  . Sexual Activity:    Partners: Male    Birth Control/ Protection: Condom   Other Topics Concern  . Not on file   Social History Narrative   Recently moved from [place] in 10/2010 due to wanting a change from stressors.   Previously followed by Dr. Wynelle Bourgeois at [facility]  No Known Allergies  Current Outpatient Prescriptions on File Prior to Visit  Medication Sig Dispense Refill  . Blood Glucose Monitoring Suppl (TRUE METRIX METER) DEVI 1 each by Does not apply route 4 (four) times daily -  before meals and at bedtime. 1 Device 0  . clopidogrel (PLAVIX) 75 MG tablet Take 1 tablet (75 mg total) by mouth daily. 30 tablet 2  . gabapentin (NEURONTIN) 300 MG capsule Take 300 mg by mouth 3 (three) times daily.    [name] glipiZIDE (GLUCOTROL) 5 MG tablet Take 1 tablet (5 mg total) by mouth 2 (two) times daily before a  meal. 60 tablet 3  . glucose blood (TRUE METRIX BLOOD GLUCOSE TEST) test strip Use as instructed 100 each 5  . insulin lispro protamine-lispro (HUMALOG 50/50) (50-50) 100 UNIT/ML SUSP injection Inject 80 Units into the skin 3 (three) times daily. Up to 60 depending on cbg    . lisinopril-hydrochlorothiazide (PRINZIDE,ZESTORETIC) 20-12.5 MG per tablet Take 1 tablet by mouth daily.    . metFORMIN (GLUCOPHAGE) 500 MG tablet Take 500 mg by mouth 2 (two) times daily with a meal.    . pravastatin (PRAVACHOL) 40 MG tablet Take 1 tablet (40 mg total) by mouth daily. 30 tablet 5  . TRUEPLUS LANCETS 28G MISC 1 each by Does not apply route 4 (four) times daily -  before meals and at bedtime. 100 each 5  . Blood Glucose Monitoring Suppl (ACCU-CHEK AVIVA PLUS) W/DEVICE KIT 1 kit by Does not apply route 3 (three) times daily before meals. (Patient not taking: Reported on 10/05/2014) 1 kit 0  . glucose blood (ACCU-CHEK AVIVA) test strip Use as instructed (Patient not taking: Reported on 08/18/2014) 100 each 12  . Multiple Vitamins-Minerals (MULTIVITAMIN WITH MINERALS) tablet Take 1 tablet by mouth daily.    . pantoprazole (PROTONIX) 40 MG tablet Take 40 mg by mouth daily.    [name] venlafaxine (EFFEXOR) 37.5 MG tablet Take 37.5 mg by mouth daily.     No current facility-administered medications on file prior to visit.     Review of Systems Constitutional: Negative for fever, chills, diaphoresis, activity change, appetite change and fatigue. HENT: Negative for ear pain, nosebleeds, congestion, facial swelling, rhinorrhea, neck pain, neck stiffness and ear discharge.  Eyes: Negative for pain, discharge, redness, itching and visual disturbance. Respiratory: Negative for cough, choking, chest tightness, shortness of breath, wheezing and stridor.  Cardiovascular: Negative for chest pain, palpitations and leg swelling. Gastrointestinal: Negative for abdominal distention. Genitourinary: Negative for dysuria, urgency,  frequency, hematuria, flank pain, decreased urine volume, difficulty urinating and dyspareunia.  Musculoskeletal: Negative for back pain, joint swelling, arthralgia and gait problem. Neurological: Negative for dizziness, tremors, seizures, syncope, facial asymmetry, speech difficulty, positive for right sided weakness, neg for light-headedness, positive for numbness on right side of face, negative for headaches.  Hematological: Negative for adenopathy. Does not bruise/bleed easily. Skin: positive for pruritic rash Psychiatric/Behavioral: Negative for hallucinations, behavioral problems, confusion, dysphoric mood, decreased concentration and agitation.      Objective: Filed Vitals:   10/05/14 1104  BP: 110/77  Pulse: 78  Temp: 98.3 F (36.8 C)  TempSrc: Oral  Resp: 18  Height: '5\' 7"'  (1.702 m)  Weight: 260 lb (117.935 kg)  SpO2: 98%      Physical Exam  Constitutional: Obese HENT: Normocephalic, atraumatic, External right and left ear normal. Oropharynx is clear and moist.  Eyes: Conjunctivae and EOM are normal. PERRLA, no scleral icterus. Neck: Normal ROM, No JVD. No tracheal deviation.  No thyromegaly. CVS: RRR, S1/S2 +, no murmurs, no gallops, no carotid bruit.  Pulmonary: Effort and breath sounds normal, no stridor, rhonchi, wheezes, rales.  Abdominal: Soft. BS +, no distension, tenderness, rebound or guarding.  Musculoskeletal: Normal range of motion. No edema and no tenderness.  Lymphadenopathy: No lymphadenopathy noted, cervical, inguinal or axillary Neuro: Alert. Cranial nerves intact. Motor strength is 5/5 in left upper and left lower extremity, motor strength is 4/5 in right upper extremity and 3+/5 in right lower extremity. Skin: Skin is warm and dry. No rash noted. Not diaphoretic. No erythema. No pallor. Psychiatric: Normal mood and affect. Behavior, judgment, thought content normal.      Assessment & Plan:  49 year old female with a history of uncontrolled type 2  diabetes mellitus (A1c 12.4), hypertension, dyslipidemia, recently hospitalized for a stroke.  UnControlled type 2 diabetes mellitus: A1c of 12.4. Blood sugar log reveals improvement in glycemic control. She insists on remaining on her Humalog 50/50 as she is on a program with her PCP where she receives medications for a year and is reluctant to trying Lantus because she states it did not work for her previously. Has been out of Jardiance which I had replaced with glipizide at her last visit. Continue metformin and  Humalog; Januvia discontinued because blood sugar log reveals sugars are controlled at home.  Diabetic Neuropathy: Remains on Gabapentin  GERD: Controlled on Pantoprazole.  CVA: With residual right sided weakness. Hypercoagulable panel still pending; will call the lab High risk for falls; ambulates with the aid of a walker. Would benefit from PT but she has no insurance Continue aspirin and Plavix; recommendation at discharge was for neurology follow-up at 2 months Referred to neurology today.  Hypertension: Controlled. Continue antihypertensives  Dyslipidemia: Continue statin.   Rash: Refilled topical steroid.  She expresses her desire to return back to her PCP Dr Jeanie Cooks once her Medicaid is reinstated but for now she will [facility] the [facility].

## 2014-10-05 NOTE — Progress Notes (Signed)
Patient here for 1 week follow up for stroke. Patient reports having good days and bad days. Patient has headache at right eye "like it was when I had my stroke", at level 6, started today. Patient suspects due to nothing to eat or drink today. Face is still numb from stroke.  Patient has dries patches on arms, across chest, feels like dry skin, itches. Uses cream and needs refill.   Patient takes gabapentin PRN instead of TID. Patient not sure if she is supposed to take Januvia with metformin and glipizide.

## 2014-10-05 NOTE — Patient Instructions (Signed)
Ischemic Stroke °A stroke (cerebrovascular accident) is the sudden death of brain tissue. It is a medical emergency. A stroke can cause permanent loss of brain function. This can cause problems with different parts of your body. A transient ischemic attack (TIA) is different because it does not cause permanent damage. A TIA is a short-lived problem of poor blood flow affecting a part of the brain. A TIA is also a serious problem because having a TIA greatly increases the chances of having a stroke. When symptoms first develop, you cannot know if the problem might be a stroke or a TIA. °CAUSES  °A stroke is caused by a decrease of oxygen supply to an area of your brain. It is usually the result of a small blood clot or collection of cholesterol or fat (plaque) that blocks blood flow in the brain. A stroke can also be caused by blocked or damaged carotid arteries.  °RISK FACTORS °· High blood pressure (hypertension). °· High cholesterol. °· Diabetes mellitus. °· Heart disease. °· The buildup of plaque in the blood vessels (peripheral artery disease or atherosclerosis). °· The buildup of plaque in the blood vessels providing blood and oxygen to the brain (carotid artery stenosis). °· An abnormal heart rhythm (atrial fibrillation). °· Obesity. °· Smoking. °· Taking oral contraceptives (especially in combination with smoking). °· Physical inactivity. °· A diet high in fats, salt (sodium), and calories. °· Alcohol use. °· Use of illegal drugs (especially cocaine and methamphetamine). °· Being African American. °· Being over the age of 55. °· Family history of stroke. °· Previous history of blood clots, stroke, TIA, or heart attack. °· Sickle cell disease. °SYMPTOMS  °These symptoms usually develop suddenly, or may be newly present upon awakening from sleep: °· Sudden weakness or numbness of the face, arm, or leg, especially on one side of the body. °· Sudden trouble walking or difficulty moving arms or legs. °· Sudden  confusion. °· Sudden personality changes. °· Trouble speaking (aphasia) or understanding. °· Difficulty swallowing. °· Sudden trouble seeing in one or both eyes. °· Double vision. °· Dizziness. °· Loss of balance or coordination. °· Sudden severe headache with no known cause. °· Trouble reading or writing. °DIAGNOSIS  °Your health care provider can often determine the presence or absence of a stroke based on your symptoms, history, and physical exam. Computed tomography (CT) of the brain is usually performed to confirm the stroke, determine causes, and determine stroke severity. Other tests may be done to find the cause of the stroke. These tests may include: °· Electrocardiography. °· Continuous heart monitoring. °· Echocardiography. °· Carotid ultrasonography. °· Magnetic resonance imaging (MRI). °· A scan of the brain circulation. °· Blood tests. °PREVENTION  °The risk of a stroke can be decreased by appropriately treating high blood pressure, high cholesterol, diabetes, heart disease, and obesity and by quitting smoking, limiting alcohol, and staying physically active. °TREATMENT  °Time is of the essence. It is important to seek treatment at the first sign of these symptoms because you may receive a medicine to dissolve the clot (thrombolytic) that cannot be given if too much time has passed since your symptoms began. Even if you do not know when your symptoms began, get treatment as soon as possible as there are other treatment options available including oxygen, intravenous (IV) fluids, and medicines to thin the blood (anticoagulants). Treatment of stroke depends on the duration, severity, and cause of your symptoms. Medicines and dietary changes may be used to address diabetes, high blood   pressure, and other risk factors. Physical, speech, and occupational therapists will assess you and work with you to improve any functions impaired by the stroke. Measures will be taken to prevent short-term and long-term  complications, including infection from breathing foreign material into the lungs (aspiration pneumonia), blood clots in the legs, bedsores, and falls. Rarely, surgery may be needed to remove large blood clots or to open up blocked arteries. °HOME CARE INSTRUCTIONS  °· Take medicines only as directed by your health care provider. Follow the directions carefully. Medicines may be used to control risk factors for a stroke. Be sure you understand all your medicine instructions. °· You may be told to take a medicine to thin the blood, such as aspirin or the anticoagulant warfarin. Warfarin needs to be taken exactly as instructed. °¨ Too much and too little warfarin are both dangerous. Too much warfarin increases the risk of bleeding. Too little warfarin continues to allow the risk for blood clots. While taking warfarin, you will need to have regular blood tests to measure your blood clotting time. These blood tests usually include both the PT and INR tests. The PT and INR results allow your health care provider to adjust your dose of warfarin. The dose can change for many reasons. It is critically important that you take warfarin exactly as prescribed, and that you have your PT and INR levels drawn exactly as directed. °¨ Many foods, especially foods high in vitamin K, can interfere with warfarin and affect the PT and INR results. Foods high in vitamin K include spinach, kale, broccoli, cabbage, collard and turnip greens, brussels sprouts, peas, cauliflower, seaweed, and parsley, as well as beef and pork liver, green tea, and soybean oil. You should eat a consistent amount of foods high in vitamin K. Avoid major changes in your diet, or notify your health care provider before changing your diet. Arrange a visit with a dietitian to answer your questions. °¨ Many medicines can interfere with warfarin and affect the PT and INR results. You must tell your health care provider about any and all medicines you take. This  includes all vitamins and supplements. Be especially cautious with aspirin and anti-inflammatory medicines. Do not take or discontinue any prescribed or over-the-counter medicine except on the advice of your health care provider or pharmacist. °¨ Warfarin can have side effects, such as excessive bruising or bleeding. You will need to hold pressure over cuts for longer than usual. Your health care provider or pharmacist will discuss other potential side effects. °¨ Avoid sports or activities that may cause injury or bleeding. °¨ Be mindful when shaving, flossing your teeth, or handling sharp objects. °¨ Alcohol can change the body's ability to handle warfarin. It is best to avoid alcoholic drinks or consume only very small amounts while taking warfarin. Notify your health care provider if you change your alcohol intake. °¨ Notify your dentist or other health care providers before procedures. °· If swallow studies have determined that your swallowing reflex is present, you should eat healthy foods. Including 5 or more servings of fruits and vegetables a day may reduce the risk of stroke. Foods may need to be a certain consistency (soft or pureed), or small bites may need to be taken in order to avoid aspirating or choking. Certain dietary changes may be advised to address high blood pressure, high cholesterol, diabetes, or obesity. °¨ Food choices that are low in sodium, saturated fat, trans fat, and cholesterol are recommended to manage high blood pressure. °¨   Food choies that are high in fiber, and low in saturated fat, trans fat, and cholesterol may control cholesterol levels. °¨ Controlling carbohydrates and sugar intake is recommended to manage diabetes. °¨ Reducing calorie intake and making food choices that are low in sodium, saturated fat, trans fat, and cholesterol are recommended to manage obesity. °· Maintain a healthy weight. °· Stay physically active. It is recommended that you get at least 30 minutes of  activity on all or most days. °· Do not use any tobacco products including cigarettes, chewing tobacco, or electronic cigarettes. °· Limit alcohol use even if you are not taking warfarin. Moderate alcohol use is considered to be: °¨ No more than 2 drinks each day for men. °¨ No more than 1 drink each day for nonpregnant women. °· Home safety. A safe home environment is important to reduce the risk of falls. Your health care provider may arrange for specialists to evaluate your home. Having grab bars in the bedroom and bathroom is often important. Your health care provider may arrange for equipment to be used at home, such as raised toilets and a seat for the shower. °· Physical, occupational, and speech therapy. Ongoing therapy may be needed to maximize your recovery after a stroke. If you have been advised to use a walker or a cane, use it at all times. Be sure to keep your therapy appointments. °· Follow all instructions for follow-up with your health care provider. This is very important. This includes any referrals, physical therapy, rehabilitation, and lab tests. Proper follow-up can prevent another stroke from occurring. °SEEK MEDICAL CARE IF: °· You have personality changes. °· You have difficulty swallowing. °· You are seeing double. °· You have dizziness. °· You have a fever. °· You have skin breakdown. °SEEK IMMEDIATE MEDICAL CARE IF:  °Any of these symptoms may represent a serious problem that is an emergency. Do not wait to see if the symptoms will go away. Get medical help right away. Call your local emergency services (911 in [place] Do not drive yourself to the hospital. °· You have sudden weakness or numbness of the face, arm, or leg, especially on one side of the body. °· You have sudden trouble walking or difficulty moving arms or legs. °· You have sudden confusion. °· You have trouble speaking (aphasia) or understanding. °· You have sudden trouble seeing in one or both eyes. °· You have a loss of  balance or coordination. °· You have a sudden, severe headache with no known cause. °· You have new chest pain or an irregular heartbeat. °· You have a partial or total loss of consciousness. °Document Released: 01/27/2005 Document Revised: 06/13/2013 Document Reviewed: 09/07/2011 °ExitCare® Patient Information ©2015 [employer]. This information is not intended to replace advice given to you by your health care provider. Make sure you discuss any questions you have with your health care provider. ° °

## 2014-10-11 ENCOUNTER — Other Ambulatory Visit: Payer: Self-pay | Admitting: *Deleted

## 2014-10-11 ENCOUNTER — Telehealth: Payer: Self-pay | Admitting: Clinical

## 2014-10-11 DIAGNOSIS — R21 Rash and other nonspecific skin eruption: Secondary | ICD-10-CM

## 2014-10-11 MED ORDER — CLOTRIMAZOLE-BETAMETHASONE 1-0.05 % EX CREA: 1.0000 "application " | TOPICAL_CREAM | Freq: Two times a day (BID) | CUTANEOUS | Status: DC

## 2014-10-11 NOTE — Telephone Encounter (Signed)
Left HIPPA-compliant message to call back Asher Muir from [facility] at [phone], attempt to f/u w pt.

## 2014-10-24 ENCOUNTER — Encounter: Payer: Self-pay | Admitting: *Deleted

## 2014-10-24 ENCOUNTER — Other Ambulatory Visit: Payer: Self-pay | Admitting: *Deleted

## 2014-10-24 NOTE — Patient Outreach (Signed)
[facility]) Care Management  10/24/2014  Michelle Brooks Mar 26, 1965 [id number]  EMMI-Stroke follow-up; Telephone call to patient who voices that she has had no admission or emergency room visits since recent hospital stay. States she has no new symptoms. Continues to have some facial numbness which is the same when she was discharged from hospital. Continues to use walker & cane carefully. Has had no falls.  States she recently had another appointment with [facility]. States she is able to get her medications there and takes them as directed by MD.   States follow-up appointment with neurologist will be in October and transportation will be provided by her daughter. States her Medicaid is still pending and that disability has been declined.   Plan: will follow care plan as noted. Will follow up next week . Appointment agreed upon by patient.  Colleen Can, RN BSN CCM Care Management Coordinator [facility] Care Management  [phone]

## 2014-10-31 ENCOUNTER — Other Ambulatory Visit: Payer: Self-pay | Admitting: *Deleted

## 2014-10-31 NOTE — Patient Outreach (Signed)
[facility]) Care Management  10/31/2014  Michelle Brooks 01-Sep-1965 [id number]   EMMI-Stroke follow up: Telephone call to patient who gave HIPPA verification .  Patient voices that she has not had any readmissions to hospital since 08/13/14 admission for stroke. States she is feeling well today without any new stroke symptoms. She continues to have numbness to face which she had when she was discharged from hospital. States she is doing facial exercises daily. States she continues to use cane or walker when ambulating and has not fallen.  Voices that she is taking medications as prescribed and has had no changes in medications. Continues with blood thinner as prescribed. States she is working to get A1c level down with a target of 7.0. Continues to check blood sugar twice daily with ranges from 121 to 140 this week. States really watching diet.  States blood work will be done in several months and she has appointment set up with previous primary care doctor Oct 3rd.(states Medicaid is pending)States she plans to make appointment with neurologist for stroke follow up.  Continues to have strong support from her daughter.   Falls prevention strategies reviewed with patient and she voices understanding. Care plan goals have been met as noted.   Plan: will close case and send MD closure letter.  Sherrin Daisy, RN BSN [place] Management Coordinator [facility] Care Management  [phone]

## 2014-11-07 ENCOUNTER — Encounter: Payer: Self-pay | Admitting: *Deleted

## 2014-11-09 NOTE — Patient Outreach (Signed)
[facility] [facility]) Care Management  11/09/2014  Michelle Brooks Feb 24, 1965 [id number]   Notification from Sherrin Daisy, RN to close case due to goals met with [facility].  Thanks, Ronnell Freshwater. [place], [place] Assistant Phone: [phone] Fax: [phone]

## 2015-01-11 ENCOUNTER — Ambulatory Visit: Payer: Medicaid Other | Admitting: Family Medicine

## 2015-01-12 ENCOUNTER — Encounter: Payer: Self-pay | Admitting: Obstetrics and Gynecology

## 2015-01-12 ENCOUNTER — Ambulatory Visit: Payer: Medicaid Other | Admitting: Obstetrics and Gynecology

## 2015-01-12 NOTE — Progress Notes (Signed)
Patient ID: Maurie BoettcherWanda D Brooks, female   DOB: 03/18/1965, 49 y.o.   MRN: [id number] Patient was not seen by a provider today

## 2015-01-12 NOTE — Progress Notes (Signed)
Pt filled out [facility] Scholarship information.  Scholarship faxed to [facility].

## 2015-01-24 ENCOUNTER — Other Ambulatory Visit: Payer: Self-pay | Admitting: Internal Medicine

## 2015-01-24 DIAGNOSIS — Z1231 Encounter for screening mammogram for malignant neoplasm of breast: Secondary | ICD-10-CM

## 2015-02-14 ENCOUNTER — Ambulatory Visit
Admission: RE | Admit: 2015-02-14 | Discharge: 2015-02-14 | Disposition: A | Discharge status: Home / Self Care | Payer: No Typology Code available for payment source | Source: Ambulatory Visit | Attending: Internal Medicine | Admitting: Internal Medicine

## 2015-02-14 DIAGNOSIS — Z1231 Encounter for screening mammogram for malignant neoplasm of breast: Secondary | ICD-10-CM

## 2015-07-16 ENCOUNTER — Encounter (HOSPITAL_COMMUNITY): Payer: Self-pay | Admitting: *Deleted

## 2015-07-16 ENCOUNTER — Inpatient Hospital Stay (HOSPITAL_COMMUNITY)
Admission: AD | Admit: 2015-07-16 | Discharge: 2015-07-16 | Disposition: A | Discharge status: Home / Self Care | Payer: Medicaid Other | Source: Ambulatory Visit | Attending: Obstetrics & Gynecology | Admitting: Obstetrics & Gynecology

## 2015-07-16 DIAGNOSIS — Z794 Long term (current) use of insulin: Secondary | ICD-10-CM | POA: Diagnosis not present

## 2015-07-16 DIAGNOSIS — E11319 Type 2 diabetes mellitus with unspecified diabetic retinopathy without macular edema: Secondary | ICD-10-CM | POA: Diagnosis not present

## 2015-07-16 DIAGNOSIS — Z3202 Encounter for pregnancy test, result negative: Secondary | ICD-10-CM | POA: Insufficient documentation

## 2015-07-16 DIAGNOSIS — B373 Candidiasis of vulva and vagina: Secondary | ICD-10-CM | POA: Diagnosis not present

## 2015-07-16 DIAGNOSIS — R102 Pelvic and perineal pain: Secondary | ICD-10-CM | POA: Diagnosis present

## 2015-07-16 DIAGNOSIS — Z8673 Personal history of transient ischemic attack (TIA), and cerebral infarction without residual deficits: Secondary | ICD-10-CM | POA: Diagnosis not present

## 2015-07-16 DIAGNOSIS — I10 Essential (primary) hypertension: Secondary | ICD-10-CM | POA: Diagnosis not present

## 2015-07-16 DIAGNOSIS — Z87891 Personal history of nicotine dependence: Secondary | ICD-10-CM | POA: Diagnosis not present

## 2015-07-16 DIAGNOSIS — Z7982 Long term (current) use of aspirin: Secondary | ICD-10-CM | POA: Insufficient documentation

## 2015-07-16 DIAGNOSIS — B3731 Acute candidiasis of vulva and vagina: Secondary | ICD-10-CM

## 2015-07-16 DIAGNOSIS — N898 Other specified noninflammatory disorders of vagina: Secondary | ICD-10-CM | POA: Diagnosis not present

## 2015-07-16 HISTORY — DX: Herpesviral infection, unspecified: B00.9

## 2015-07-16 LAB — URINE MICROSCOPIC-ADD ON: RBC / HPF: NONE SEEN RBC/hpf (ref 0–5)

## 2015-07-16 LAB — WET PREP, GENITAL
CLUE CELLS WET PREP: NONE SEEN
Sperm: NONE SEEN
Trich, Wet Prep: NONE SEEN
YEAST WET PREP: NONE SEEN

## 2015-07-16 LAB — URINALYSIS, ROUTINE W REFLEX MICROSCOPIC
BILIRUBIN URINE: NEGATIVE
GLUCOSE, UA: 500 mg/dL — AB
Hgb urine dipstick: NEGATIVE
KETONES UR: 15 mg/dL — AB
NITRITE: NEGATIVE
PH: 6 (ref 5.0–8.0)
Protein, ur: NEGATIVE mg/dL
Specific Gravity, Urine: 1.015 (ref 1.005–1.030)

## 2015-07-16 LAB — POCT PREGNANCY, URINE: PREG TEST UR: NEGATIVE

## 2015-07-16 MED ORDER — ACYCLOVIR 400 MG PO TABS: 400.0000 mg | ORAL_TABLET | Freq: Three times a day (TID) | ORAL | Status: DC

## 2015-07-16 MED ORDER — FLUCONAZOLE 150 MG PO TABS: 150.0000 mg | ORAL_TABLET | Freq: Once | ORAL | Status: DC | PRN

## 2015-07-16 MED ORDER — FLUCONAZOLE 150 MG PO TABS
150.0000 mg | ORAL_TABLET | Freq: Once | ORAL | Status: AC
  Administered 2015-07-16: 150 mg via ORAL
  Filled 2015-07-16: qty 1

## 2015-07-16 NOTE — MAU Note (Signed)
Been having problems with her 'area', feeling irritated.has had some itching and irritation, something is not right.    Is a diabetic, hard to heal. Going on over a wk.

## 2015-07-16 NOTE — Discharge Instructions (Signed)
Monilial Vaginitis Vaginitis in a soreness, swelling and redness (inflammation) of the vagina and vulva. Monilial vaginitis is not a sexually transmitted infection. CAUSES  Yeast vaginitis is caused by yeast (candida) that is normally found in your vagina. With a yeast infection, the candida has overgrown in number to a point that upsets the chemical balance. SYMPTOMS   White, thick vaginal discharge.  Swelling, itching, redness and irritation of the vagina and possibly the lips of the vagina (vulva).  Burning or painful urination.  Painful intercourse. DIAGNOSIS  Things that may contribute to monilial vaginitis are:  Postmenopausal and virginal states.  Pregnancy.  Infections.  Being tired, sick or stressed, especially if you had monilial vaginitis in the past.  Diabetes. Good control will help lower the chance.  Birth control pills.  Tight fitting garments.  Using bubble bath, feminine sprays, douches or deodorant tampons.  Taking certain medications that kill germs (antibiotics).  Sporadic recurrence can occur if you become ill. TREATMENT  Your caregiver will give you medication.  There are several kinds of anti monilial vaginal creams and suppositories specific for monilial vaginitis. For recurrent yeast infections, use a suppository or cream in the vagina 2 times a week, or as directed.  Anti-monilial or steroid cream for the itching or irritation of the vulva may also be used. Get your caregiver's permission.  Painting the vagina with methylene blue solution may help if the monilial cream does not work.  Eating yogurt may help prevent monilial vaginitis. HOME CARE INSTRUCTIONS   Finish all medication as prescribed.  Do not have sex until treatment is completed or after your caregiver tells you it is okay.  Take warm sitz baths.  Do not douche.  Do not use tampons, especially scented ones.  Wear cotton underwear.  Avoid tight pants and panty  hose.  Tell your sexual partner that you have a yeast infection. They should go to their caregiver if they have symptoms such as mild rash or itching.  Your sexual partner should be treated as well if your infection is difficult to eliminate.  Practice safer sex. Use condoms.  Some vaginal medications cause latex condoms to fail. Vaginal medications that harm condoms are:  Cleocin cream.  Butoconazole (Femstat).  Terconazole (Terazol) vaginal suppository.  Miconazole (Monistat) (may be purchased over the counter). SEEK MEDICAL CARE IF:   You have a temperature by mouth above 102 F (38.9 C).  The infection is getting worse after 2 days of treatment.  The infection is not getting better after 3 days of treatment.  You develop blisters in or around your vagina.  You develop vaginal bleeding, and it is not your menstrual period.  You have pain when you urinate.  You develop intestinal problems.  You have pain with sexual intercourse.   This information is not intended to replace advice given to you by your health care provider. Make sure you discuss any questions you have with your health care provider.   Document Released: 11/06/2004 Document Revised: 04/21/2011 Document Reviewed: 07/31/2014 Elsevier Interactive Patient Education 2016 [employer] Genital Herpes Genital herpes is a common sexually transmitted infection (STI) that is caused by a virus. The virus is spread from person to person through sexual contact. Infection can cause itching, blisters, and sores in the genital area or rectal area. This is called an outbreak. It affects both men and women. Genital herpes is particularly concerning for pregnant women because the virus can be passed to the baby during delivery and cause  serious problems. Genital herpes is also a concern for people with a weakened defense (immune) system. Symptoms of genital herpes may last several days and then go away. However, the virus  remains in your body, so you may have more outbreaks of symptoms in the future. The time between outbreaks varies and can be months or years. CAUSES Genital herpes is caused by a virus called herpes simplex virus (HSV) type 2 or HSV type 1. These viruses are contagious and are most often spread through sexual contact with an infected person. Sexual contact includes vaginal, anal, and oral sex. RISK FACTORS Risk factors for genital herpes include:  Being sexually active with multiple partners.  Having unprotected sex. SIGNS AND SYMPTOMS Symptoms may include:  Pain and itching in the genital area or rectal area.  Small red bumps that turn into blisters and then turn into sores.  Flu-like symptoms, including:  Fever.  Body aches.  Painful urination.  Vaginal discharge. DIAGNOSIS Genital herpes may be diagnosed by:  Physical exam.  Blood test.  Fluid culture test from an open sore. TREATMENT There is no cure for genital herpes. Oral antiviral medicines may be used to speed up healing and to help prevent the return of symptoms. These medicines can also help to reduce the spread of the virus to sexual partners. HOME CARE INSTRUCTIONS  Keep the affected areas dry and clean.  Take medicines only as directed by your health care provider.  Do not have sexual contact during active infections. Genital herpes is contagious.  Practice safe sex. Latex condoms and female condoms may help to prevent the spread of the herpes virus.  Avoid rubbing or touching the blisters and sores. If you do touch the blister or sores:  Wash your hands thoroughly.  Do not touch your eyes afterward.  If you become pregnant, tell your health care provider if you have had genital herpes.  Keep all follow-up visits as directed by your health care provider. This is important. PREVENTION  Use condoms. Although anyone can contract genital herpes during sexual contact even with the use of a condom, a  condom can provide some protection.  Avoid having multiple sexual partners.  Talk to your sexual partner about any symptoms and past history that either of you may have.  Get tested before you have sex. Ask your partner to do the same.  Recognize the symptoms of genital herpes. Do not have sexual contact if you notice these symptoms. SEEK MEDICAL CARE IF:  Your symptoms are not improving with medicine.  Your symptoms return.  You have new symptoms.  You have a fever.  You have abdominal pain.  You have redness, swelling, or pain in your eye. MAKE SURE YOU:  Understand these instructions.  Will watch your condition.  Will get help right away if you are not doing well or get worse.   This information is not intended to replace advice given to you by your health care provider. Make sure you discuss any questions you have with your health care provider.   Document Released: 01/25/2000 Document Revised: 02/17/2014 Document Reviewed: 06/14/2013 Elsevier Interactive Patient Education [employer]

## 2015-07-16 NOTE — MAU Provider Note (Signed)
History     CSN: [id number]  Arrival date and time: 07/16/15 1017   First Provider Initiated Contact with Patient 07/16/15 1226      Chief Complaint  Patient presents with  . Vaginal Pain   HPI Michelle Brooks is a 50 y.o. female who presents with vaginal irritation & discharge. Symptoms began 2 weeks ago. Can't tell what the discharge looks like but has not noticed an odor. Reports vaginal itching of her labia & irritation/peeling of the intertriginous area of her bilateral groin. Pt states she went to the [facility] today for STD testing but they couldn't see her for her other complaints so she came here. Denies vaginal bleeding. Has had 1 partner for the last 6 years.  PMH significant for stroke, hypertension, and diabetes; pt is currently being followed by a PCP who is managing these conditions. States she has been wearing adult diapers for the last few months d/t issues with fecal & urinary incontinence. Able to change diapers soon after incontinent episode.    OB History    Gravida Para Term Preterm AB TAB SAB Ectopic Multiple Living   _0 Past Medical History  Diagnosis Date  . Diabetes mellitus type 2 in obese ([place])   . Hypertension   . 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  [name] Hyperlipidemia LDL goal < 100 04/01/2011  . Chronic back pain   . Headache   . Diabetic retinopathy [facility])     Past Surgical History  Procedure Laterality Date  . Breast lumpectomy  2002    states benign lymph node removed.    Family History  Problem Relation Age of Onset  . Diabetes Mother   . Stroke  Mother 66  . Diabetes Brother   . Diabetes Brother   . Cancer Brother 34    unknown type  . Hypertension Brother     Social History  Substance Use Topics  . Smoking status: Former Smoker -- 0.20 packs/day for 7 years    Types: Cigarettes    Quit date: 03/26/2004  . Smokeless tobacco: Never Used  . Alcohol Use: No    Allergies:  Allergies  Allergen Reactions  . Latex     Like a burning in area    Prescriptions prior to admission  Medication Sig Dispense Refill Last Dose  . aspirin 81 MG tablet Take 81 mg by mouth daily.   07/15/2015 at Unknown time  . gabapentin (NEURONTIN) 300 MG capsule Take 300 mg by mouth 3 (three) times daily.   Past Month at Unknown time  . insulin lispro protamine-lispro (HUMALOG 50/50) (50-50) 100 UNIT/ML SUSP injection Inject 80 Units into the skin 3 (three) times daily. Up to 60 depending on cbg   07/15/2015 at Unknown time  . lisinopril-hydrochlorothiazide (PRINZIDE,ZESTORETIC) 20-12.5 MG per tablet Take 1 tablet by mouth daily.   07/15/2015 at Unknown time  . metFORMIN (GLUCOPHAGE) 500 MG tablet Take 500 mg by mouth 2 (two) times daily with a meal.   07/15/2015 at Unknown time  . pravastatin (PRAVACHOL)  40 MG tablet Take 1 tablet (40 mg total) by mouth daily. 30 tablet 5 07/15/2015 at Unknown time  . venlafaxine (EFFEXOR) 37.5 MG tablet Take 37.5 mg by mouth daily.   Past Month at Unknown time  . Blood Glucose Monitoring Suppl (ACCU-CHEK AVIVA PLUS) W/DEVICE KIT 1 kit by Does not apply route 3 (three) times daily before meals. (Patient not taking: Reported on 10/05/2014) 1 kit 0 Not Taking  . Blood Glucose Monitoring Suppl (TRUE METRIX METER) DEVI 1 each by Does not apply route 4 (four) times daily -  before meals and at bedtime. 1 Device 0 Taking  . clopidogrel (PLAVIX) 75 MG tablet Take 1 tablet (75 mg total) by mouth daily. (Patient not taking: Reported on 07/16/2015) 30 tablet 2 Not Taking at Unknown time  . glucose blood (TRUE METRIX BLOOD GLUCOSE TEST) test  strip Use as instructed 100 each 5 Taking  . TRUEPLUS LANCETS 28G MISC 1 each by Does not apply route 4 (four) times daily -  before meals and at bedtime. 100 each 5 Taking    Review of Systems  Constitutional: Negative.   Respiratory: Negative for shortness of breath.   Cardiovascular: Negative for chest pain.  Gastrointestinal: Negative.   Genitourinary: Negative for dysuria.       + vaginal discharge & irritation  Neurological: Negative for headaches.   Physical Exam   Blood pressure 163/93, pulse 91, temperature 98.5 F (36.9 C), temperature source Oral, resp. rate 18.  Physical Exam  Nursing note and vitals reviewed. Constitutional: She is oriented to person, place, and time. She appears well-developed and well-nourished. No distress.  HENT:  Head: Normocephalic and atraumatic.  Eyes: Conjunctivae are normal. Right eye exhibits no discharge. Left eye exhibits no discharge. No scleral icterus.  Neck: Normal range of motion.  Cardiovascular: Normal rate, regular rhythm and normal heart sounds.   No murmur heard. Respiratory: Effort normal and breath sounds normal. No respiratory distress. She has no wheezes.  Genitourinary: There is tenderness and lesion (small ulterated lesion x 1,mid right labia minora) on the right labia. There is tenderness on the left labia. There is erythema in the vagina. Vaginal discharge (small amount of clumpy white discharge adherant to vaginal walls) found.  Neurological: She is alert and oriented to person, place, and time.  Skin: Skin is warm and dry. She is not diaphoretic.  Psychiatric: She has a normal mood and affect. Her behavior is normal. Judgment and thought content normal.    MAU Course  Procedures Results for orders placed or performed during the hospital encounter of 07/16/15 (from the past 24 hour(s))  Urinalysis, Routine w reflex microscopic (not at [facility])     Status: Abnormal   Collection Time: 07/16/15 10:45 AM  Result Value Ref  Range   Color, Urine YELLOW YELLOW   APPearance HAZY (A) CLEAR   Specific Gravity, Urine 1.015 1.005 - 1.030   pH 6.0 5.0 - 8.0   Glucose, UA 500 (A) NEGATIVE mg/dL   Hgb urine dipstick NEGATIVE NEGATIVE   Bilirubin Urine NEGATIVE NEGATIVE   Ketones, ur 15 (A) NEGATIVE mg/dL   Protein, ur NEGATIVE NEGATIVE mg/dL   Nitrite NEGATIVE NEGATIVE   Leukocytes, UA TRACE (A) NEGATIVE  Urine microscopic-add on     Status: Abnormal   Collection Time: 07/16/15 10:45 AM  Result Value Ref Range   Squamous Epithelial / LPF 0-5 (A) NONE SEEN   WBC, UA 0-5 0 - 5 WBC/hpf   RBC / HPF NONE  SEEN 0 - 5 RBC/hpf   Bacteria, UA RARE (A) NONE SEEN  Pregnancy, urine POC     Status: None   Collection Time: 07/16/15 10:52 AM  Result Value Ref Range   Preg Test, Ur NEGATIVE NEGATIVE  Wet prep, genital     Status: Abnormal   Collection Time: 07/16/15  1:00 PM  Result Value Ref Range   Yeast Wet Prep HPF POC NONE SEEN NONE SEEN   Trich, Wet Prep NONE SEEN NONE SEEN   Clue Cells Wet Prep HPF POC NONE SEEN NONE SEEN   WBC, Wet Prep HPF POC FEW (A) NONE SEEN   Sperm NONE SEEN     MDM UPT negative HSV culture obtained of suspicious labial lesion Diflucan 150 mg po in MAU Will start on acyclovir while HSV culture pending Pt is on medication for CHTN -- denies headache, chest pain, or SOB & is being followed by PCP with routine visits  Assessment and Plan  A: 1. Vaginal lesion   2. Vaginal yeast infection     P; Discharge home Rx acylovir Rx diflucan 150 mg po in 3 days if symptoms don't improve HSV culture pending Use barrier ointment (i.e. A&D ointment) in groin while wearing adult diapers  Jorje Guild 07/16/2015, 12:26 PM

## 2015-07-18 LAB — HSV CULTURE AND TYPING

## 2015-07-23 ENCOUNTER — Telehealth: Payer: Self-pay | Admitting: Student

## 2015-07-23 ENCOUNTER — Encounter (HOSPITAL_COMMUNITY): Payer: Self-pay | Admitting: Student

## 2015-07-23 NOTE — Telephone Encounter (Signed)
Verified pt identity by name & DOB.  Informed patient of positive HSV results. Patient currently taking acyclovir. Questions answered.

## 2015-12-19 ENCOUNTER — Ambulatory Visit: Payer: Medicaid Other | Attending: Neurology | Admitting: Physical Therapy

## 2015-12-19 ENCOUNTER — Encounter: Payer: Self-pay | Admitting: Physical Therapy

## 2015-12-19 VITALS — BP 133/92 | HR 80

## 2015-12-19 DIAGNOSIS — G8191 Hemiplegia, unspecified affecting right dominant side: Secondary | ICD-10-CM | POA: Diagnosis present

## 2015-12-19 DIAGNOSIS — R2689 Other abnormalities of gait and mobility: Secondary | ICD-10-CM | POA: Diagnosis present

## 2015-12-19 DIAGNOSIS — M6281 Muscle weakness (generalized): Secondary | ICD-10-CM | POA: Diagnosis present

## 2015-12-19 DIAGNOSIS — R2681 Unsteadiness on feet: Secondary | ICD-10-CM | POA: Diagnosis present

## 2015-12-19 NOTE — Therapy (Signed)
[facility] [address] [place], [place], [address] Phone: [phone]   Fax:  [phone]  Physical Therapy Evaluation  Patient Details  Name: Michelle Brooks MRN: [id number] Date of Birth: 06/02/1965 Referring Provider: Rema Jasminelukayode O Onasanya, MD  Encounter Date: 12/19/2015      PT End of Session - 12/19/15 1001    Visit Number 1   Number of Visits 4  eval + 3 visits   Date for PT Re-Evaluation 01/18/16   Authorization Type Medicaid   Authorization Time Period authorization pending   PT Start Time 0834   PT Stop Time 0931   PT Time Calculation (min) 57 min   Equipment Utilized During Treatment Gait belt   Activity Tolerance Patient tolerated treatment well   Behavior During Therapy [facility] for tasks assessed/performed;Anxious      Past Medical History:  Diagnosis Date  . Chronic back pain   . Diabetes mellitus type 2 in obese (HCC)   . Diabetic retinopathy (HCC)   . Headache   . 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  [name]. HSV-2 infection   . Hyperlipidemia LDL goal < 100 04/01/2011  . Hypertension     Past Surgical History:  Procedure Laterality Date  . BREAST LUMPECTOMY  2002   states benign lymph node removed.    Vitals:   12/19/15 0855  BP: (!) 133/92  Pulse: 80         Subjective Assessment - 12/19/15 0847    Subjective Pt sustained CVA on 08/18/14 with R hemiparesis. Pt DC'ed from hospital with RW. [facility] Prior to CVA, pt completely independent for all household/community mobility without device.    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   Patient Stated Goals "I wanna walk.[name].[name].I wanna get back on my feet."   Currently in Pain? No/denies  No pain at this time; experiences back pain at times during mobility.            [facility] - 12/19/15 0001      Assessment   Medical Diagnosis CVA   Referring Provider Rema Jasminelukayode O Onasanya, MD   Onset Date/Surgical Date 08/18/14   Hand Dominance Right     Precautions   Precautions Fall     Restrictions   Weight Bearing Restrictions No     Balance Screen   Has the patient fallen in the past 6 months No   Has the patient had a decrease in activity level because of a fear of falling?  Yes   Is the patient reluctant to leave their home because of a fear of falling?  Yes     Home Environment   Living Environment Private residence   Living Arrangements Spouse/significant other  lives with 50 y/o daughter; works FT   Type of Home Other(Comment)  townhome  Home Access Stairs to enter   Entrance Stairs-Number of Steps 15   Entrance Stairs-Rails Left   Home Layout One level   Home Equipment Walker - 2 wheels;Cane - quad     Prior Function   Level of Independence Independent   Vocation Unemployed     Cognition   Overall Cognitive Status Within Functional Limits for tasks assessed     Sensation   Light Touch Appears Intact   Proprioception Appears Intact     Coordination   Gross Motor Movements are Fluid and Coordinated No   Heel Shin Test RLE impaired     Tone   Assessment Location Right Lower Extremity     ROM / Strength   AROM / PROM / Strength AROM;PROM;Strength     AROM   Overall AROM  Deficits   Overall AROM Comments Limited  B hip extension.     PROM   Overall PROM  Deficits   Overall PROM Comments Limited B hip extension.     Strength   Overall Strength Deficits   Overall Strength Comments L hip flexion 2+/5, L knee flexion 3+/5, L knee extension 4/5, L ankle PF/DF grossly WFL.   Strength Assessment Site Hip;Knee;Ankle   Right/Left Hip Right   Right Hip Flexion 3-/5   Right/Left Knee Right   Right Knee Flexion 3+/5   Right Knee Extension 4-/5   Right/Left Ankle Right   Right Ankle Dorsiflexion 3/5   Right Ankle Plantar Flexion 3-/5   Right Ankle Inversion 3+/5   Right Ankle Eversion 2-/5     Balance   Balance Assessed Yes     Static Standing Balance   Static Standing - Balance Support No upper extremity supported   Static Standing - Level of Assistance 4: Min assist   Static Standing - Comment/# of Minutes Requires min A, HHA to attempt static standing with narrow BOS due to pt fear of fallling. During attempt, note L subtalar inversion/eversion (unable to rule out inappropriate balance recovery reaction, impaired midline orientation)     RLE Tone   RLE Tone Other (comment)  no velocity-dependent tone on MAS                   [facility] Adult PT Treatment/Exercise - 12/19/15 0001      Bed Mobility   Bed Mobility Supine to Sit;Sit to Supine   Supine to Sit 5: Supervision   Sit to Supine 5: Supervision   Sit to Supine - Details (indicate cue type and reason) cueing for logroll technique to increased efficiency of movement     Transfers   Transfers Sit to Stand;Stand to Sit   Sit to Stand 5: Supervision   Sit to Stand Details (indicate cue type and reason) unable to stand from standard chair without BUE use   Stand to Sit 5: Supervision     Ambulation/Gait   Ambulation/Gait Yes   Ambulation/Gait Assistance 4: Min guard;4: Min assist;5: Supervision   Ambulation/Gait Assistance Details Gait x41' with [facility] and no R AFO with min guard-min A; x100' with [facility] and R AFO (Reaction) with  close supervision to min guard. Note that pt tends to reach for nearby objects for support during gait, even with use of cane. PT provided cueing on changing cane position from R to L hand, sequencing cane advancement with RLE advancement, and for increased LLE step length for more normalized weight shift.   Ambulation Distance (Feet) 185 Feet  total   Assistive device Small  based quad cane   Gait Pattern Decreased arm swing - right;Decreased arm swing - left;Step-to pattern;Decreased step length - left;Decreased stance time - right;Decreased dorsiflexion - right;Decreased weight shift to right;Right flexed knee in stance;Ataxic;Decreased trunk rotation  RLE ataxia, LLE inversion/eversion throughout   Ambulation Surface Level;Indoor   Gait velocity 0.59 ft/sec  < 1.8 ft/sec indicates risk of recurrent falls                PT Education - 12/19/15 1009    Education provided Yes   Education Details PT eval findings, goals, and POC. Explained limitations of insurance coverage and possible alternatives (self pay, local pro bono clinic) after visits covered by insurance are exhausted. Recommended pt use RW for all mobility to decrease fall risk and provided tennis balls for posterior legs of RW.   Person(s) Educated Patient;Spouse   Methods Explanation   Comprehension Verbalized understanding          PT Short Term Goals - 12/19/15 1243      PT SHORT TERM GOAL #1   Title STG's = LTG's           PT Long Term Goals - 12/19/15 1243      PT LONG TERM GOAL #1   Title Pt will demonstrate independence with initial HEP to maximize functional gains made in PT.  (Target: 01/16/16)   Baseline Patient completely dependent for HEP.   Status New     PT LONG TERM GOAL #2   Title Orthotist will see patient (during PT session or in office) to assist in determining in pt appropriate candidate for AFO to maximize safety with fmobility.  (01/16/16)   Baseline Patient does not have appt  scheduled with orthotist but may benefit from R AFO to maximize pt safety with mobility.   Status New     PT LONG TERM GOAL #3   Title Pt will negotiate 15 stairs with L rail (to ascend) with mod I using LRAD to enable pt to safely use primary home entrance.  (01/16/16)   Baseline Pt currently requires assistance of daughter or spouse to negotiate stairs to enter home.   Status New     PT LONG TERM GOAL #4   Title Pt will ambulate x100' over level, indoor surfaces with mod I using LRAD to indicate increased independence with household mobility.  (01/16/16)   Baseline Pt requires up to min A for gait x85' over level, indoor surfaces with [facility].   Status New     PT LONG TERM GOAL #5   Title Pt will ambulate x200' over unlevel, paved surfaces including curb step and ramp with supervision using LRAD to indicate increased safety with community mobility.  (01/16/16)   Baseline Pt requires up to min A for gait x85' over level, indoor surfaces with [facility].   Status New     Additional Long Term Goals   Additional Long Term Goals Yes     PT LONG TERM GOAL #6   Title Pt will improve gait velocity from 0.59 ft/sec to >/= 0.89 ft/sec to indicate increased efficiency of mobility.  (01/16/16)   Baseline gait velocity = 0.59 ft/sec   Status New               Plan - 12/19/15 1003    Clinical Impression Statement Pt is a 50 y/o F referred to outpatient neuro PT to address functional impairments associated with right hemiplegia, unspecified, affecting right dominant side (G81.91) sustained 08/18/14. Pt was  hospitalized from 7/8 - 08/22/14 for said CVA.  PMH significant for: CVA's x2 in 2011, HLD, HTN, DM II.  PT evaluation reveals the following: R hemiplegia, impaired coordination, gait impairments, impaired midline orientation, impaired standing balance, decreased stability/independence with all aspects of functional mobility, significant fear of falling, gait velocity suggestive of risk of recurrent falls .  Pt will benefit from skilled outpatient PT 1x/week for 3 weeks (limited to 3 visits by insurance coverage) to address said impairments.    Rehab Potential Fair   Clinical Impairments Affecting Rehab Potential financial/insurance limitations; chronicity of impairments   PT Frequency 1x / week   PT Duration 3 weeks   PT Treatment/Interventions ADLs/Self Care Home Management;DME Instruction;Gait training;Stair training;Functional mobility training;Therapeutic activities;Patient/family education;Orthotic Fit/Training;Therapeutic exercise;Balance training;Neuromuscular re-education;Passive range of motion;Manual techniques;Visual/perceptual remediation/compensation;Vestibular   PT Next Visit Plan Initiate HEP for standing balance. Determine appropriate assistive device (this PT recommended RW on eval). Assess stair negotiation and community mobility.    Consulted and Agree with Plan of Care Patient;Other (Comment)   Family Member Consulted Les PouCarlton (spouse?)      Patient will benefit from skilled therapeutic intervention in order to improve the following deficits and impairments:  Abnormal gait, Decreased balance, Decreased coordination, Decreased knowledge of use of DME, Decreased strength, Impaired perceived functional ability, Impaired flexibility, Decreased mobility, Impaired vision/preception, Other (comment) (Pain will be monitored but not directly addressed by PT due to nature of referral.)  Visit Diagnosis: Hemiplegia affecting right dominant side, unspecified etiology, unspecified hemiplegia type (HCC) - Plan: PT plan of care cert/re-cert  Other abnormalities of gait and mobility - Plan: PT plan of care cert/re-cert  Unsteadiness on feet - Plan: PT plan of care cert/re-cert  Muscle weakness (generalized) - Plan: PT plan of care cert/re-cert     Problem List Patient Active Problem List   Diagnosis Date Noted  . Right hemiparesis (HCC) 10/05/2014  . Diabetic neuropathy (HCC) 09/15/2014   . GERD (gastroesophageal reflux disease) 09/15/2014  . Stroke (HCC) 08/18/2014  . CVA (cerebral infarction) 08/18/2014  . Hot flashes 12/03/2012  . High risk sexual behavior 05/13/2012  . Chronic low back pain 12/29/2011  . Obesity 10/23/2011  . Financial difficulties 10/23/2011  . Poor dentition 10/23/2011  . Paresthesia of left arm and leg 10/23/2011  . Irregular periods/menstrual cycles 09/12/2011  . Preventative health care 05/13/2011  . Dyslipidemia 04/01/2011  . Hypertension 03/27/2011  . History of stroke 03/13/2010  . Diabetes mellitus type 2, uncontrolled, with complications (HCC) 02/11/2004    Jorje GuildBlair Corinne Goucher, PT, DPT [facility] [address] [place], [place], [address] Phone: [phone]   Fax:  [phone] 12/19/15, 12:53 PM  Name: Michelle Brooks MRN: [id number] Date of Birth: 10/13/1965

## 2015-12-31 ENCOUNTER — Ambulatory Visit: Payer: Medicaid Other | Admitting: *Deleted

## 2015-12-31 DIAGNOSIS — M6281 Muscle weakness (generalized): Secondary | ICD-10-CM

## 2015-12-31 DIAGNOSIS — G8191 Hemiplegia, unspecified affecting right dominant side: Secondary | ICD-10-CM

## 2015-12-31 DIAGNOSIS — R2681 Unsteadiness on feet: Secondary | ICD-10-CM

## 2015-12-31 DIAGNOSIS — R2689 Other abnormalities of gait and mobility: Secondary | ICD-10-CM

## 2015-12-31 NOTE — Patient Instructions (Signed)
Knee Roll    Lying on back, with knees bent and feet flat on floor, arms outstretched to sides, slowly roll both knees to side, hold 5 seconds. Back to starting position, hold 5 seconds. Then to opposite side, hold 5 seconds. Return to starting position. Keep shoulders and arms in contact with floor.   Copyright  VHI. All rights reserved.  Bracing With Bridging (Hook-Lying)    With neutral spine, tighten pelvic floor and abdominals and hold. Lift bottom. Repeat __5_ times. Do __2_ times a day.   Copyright  VHI. All rights reserved.  Sit to Stand / Stand to Sit / Transfers    Sit on edge of a solid chair with arms, feet flat on floor. Lean forward over feet and stand up with hands on chair arms. Sit down slowly with hands on chair arms. Repeat _5___ times per session. Do __2__ sessions per day.  Copyright  VHI. All rights reserved.

## 2015-12-31 NOTE — Therapy (Signed)
[facility] [address] [place], [place], [address] Phone: [phone]   Fax:  [phone]  Physical Therapy Treatment  Patient Details  Name: Michelle BoettcherWanda D Wist MRN: [id number] Date of Birth: 01/28/1966 Referring Provider: Rema Jasminelukayode O Onasanya, MD  Encounter Date: 12/31/2015      PT End of Session - 12/31/15 1300    Visit Number 2   Number of Visits 4   Date for PT Re-Evaluation 01/18/16   Authorization Type Medicaid   Authorization Time Period 3 visits authorized 12/28/15-01/17/16   Authorization - Visit Number 2   Authorization - Number of Visits 4   PT Start Time 1145   PT Stop Time 1234   PT Time Calculation (min) 49 min   Activity Tolerance Patient tolerated treatment well   Behavior During Therapy [facility] for tasks assessed/performed      Past Medical History:  Diagnosis Date  . Chronic back pain   . Diabetes mellitus type 2 in obese (HCC)   . Diabetic retinopathy (HCC)   . Headache   . 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  [name]. HSV-2 infection   . Hyperlipidemia LDL goal < 100 04/01/2011  . Hypertension     Past Surgical History:  Procedure Laterality Date  . BREAST LUMPECTOMY  2002   states benign lymph node removed.    There were no vitals filed for this visit.      Subjective Assessment - 12/31/15 1152    Subjective She reports no falls, states holds onto walls on stairs, uses walker outside if it is raining, but cane in the house due to carpet   Currently in Pain? Yes   Pain Score 8    Pain Location Back   Pain  Orientation Lower   Pain Type Chronic pain   Pain Onset More than a month ago   Pain Frequency Intermittent   Aggravating Factors  can't stand or sit too long, has to use grocery cart scooter in store   Pain Relieving Factors better to sit if been standing or sitting too long                         [facility] Adult PT Treatment/Exercise - 12/31/15 0001      Bed Mobility   Bed Mobility Supine to Sit;Sit to Supine   Supine to Sit 6: Modified independent (Device/Increase time)   Sit to Supine 5: Supervision   Sit to Supine - Details (indicate cue type and reason) increased time and pt having to manually lift legs onto bed, cues for positioning     Transfers   Transfers Sit to Stand;Stand to Sit   Sit to Stand 5: Supervision;4: Min assist   Sit to Stand Details (indicate cue type and reason) S when performed with Bilat UE support, min A and cues for technique with less UE support   Number of Reps Other reps (comment)  5     Ambulation/Gait   Ambulation/Gait Yes   Ambulation/Gait Assistance 4: Min guard;4: Min assist;5: Supervision   Ambulation/Gait Assistance Details minguard with cane no  AFO, min A with cane and AFO, S with RW and AFO   Ambulation Distance (Feet) 115 Feet  x 2   Assistive device Small based quad cane;Rolling walker  R AFO   Gait Pattern Step-through pattern;Ataxic;Decreased stride length;Decreased dorsiflexion - right;Trunk flexed  R ankle eversion in mid to terminal stance,improved with AFO   Ambulation Surface Level;Indoor   Stairs Yes   Stairs Assistance 5: Supervision;4: Min guard   Stairs Assistance Details (indicate cue type and reason) assist for safety, cues for technique   Stair Management Technique One rail Left;Step to pattern;Sideways   Number of Stairs 4   Height of Stairs 6     Exercises   Exercises Lumbar;Knee/Hip     Lumbar Exercises: Supine   Bridge 5 reps   Bridge Limitations cues for abdominal activation and breath control    Other Supine Lumbar Exercises trunk rotation in small range in hooklying x 10 reps     Knee/Hip Exercises: Seated   Sit to Sand 5 reps;with UE support  one UE support, cues for posture, technique and LE use                PT Education - 12/31/15 1249    Education provided Yes   Education Details HEP, walker use especially when leaving home, and even recommended indoors though pt reports carpet too thick for walker in her home.    Person(s) Educated Patient   Methods Explanation;Demonstration;Handout   Comprehension Need further instruction;Verbalized understanding;Returned demonstration          PT Short Term Goals - 12/19/15 1243      PT SHORT TERM GOAL #1   Title STG's = LTG's           PT Long Term Goals - 12/19/15 1243      PT LONG TERM GOAL #1   Title Pt will demonstrate independence with initial HEP to maximize functional gains made in PT.  (Target: 01/16/16)   Baseline Patient completely dependent for HEP.   Status New     PT LONG TERM GOAL #2   Title Orthotist will see patient (during PT session or in office) to assist in determining in pt appropriate candidate for AFO to maximize safety with fmobility.  (01/16/16)   Baseline Patient does not have appt scheduled with orthotist but may benefit from R AFO to maximize pt safety with mobility.   Status New     PT LONG TERM GOAL #3   Title Pt will negotiate 15 stairs with L rail (to ascend) with mod I using LRAD to enable pt to safely use primary home entrance.  (01/16/16)   Baseline Pt currently requires assistance of daughter or spouse to negotiate stairs to enter home.   Status New     PT LONG TERM GOAL #4   Title Pt will ambulate x100' over level, indoor surfaces with mod I using LRAD to indicate increased independence with household mobility.  (01/16/16)   Baseline Pt requires up to min A for gait x85' over level, indoor surfaces with [facility].   Status New     PT LONG TERM GOAL #5   Title Pt will ambulate  x200' over unlevel, paved surfaces including curb step and ramp with supervision using LRAD to indicate increased safety with community mobility.  (01/16/16)   Baseline Pt requires up to min A for gait x85' over level, indoor surfaces with [facility].   Status New     Additional Long Term Goals  Additional Long Term Goals Yes     PT LONG TERM GOAL #6   Title Pt will improve gait velocity from 0.59 ft/sec to >/= 0.89 ft/sec to indicate increased efficiency of mobility.  (01/16/16)   Baseline gait velocity = 0.59 ft/sec   Status New               Plan - 12/31/15 1301    Clinical Impression Statement Patient demonstrates improved safety, confidence and gait speed with walker and AFO.  Does fatigue quickly needing rest breaks from walking during session.  Initial HEP performed without increased pain, but cautioned pt to stop if noted impacting her mobility.  Feel orthotist consult indicated for next session as well as continued gait, strengthening and balance activiites.    PT Frequency 1x / week   PT Duration 3 weeks   PT Treatment/Interventions ADLs/Self Care Home Management;DME Instruction;Gait training;Stair training;Functional mobility training;Therapeutic activities;Patient/family education;Orthotic Fit/Training;Therapeutic exercise;Balance training;Neuromuscular re-education;Passive range of motion;Manual techniques;Visual/perceptual remediation/compensation;Vestibular   PT Next Visit Plan Add to HEP for balance, complete orthotist consult, address safety/fall prevention.      Patient will benefit from skilled therapeutic intervention in order to improve the following deficits and impairments:  Abnormal gait, Decreased balance, Decreased coordination, Decreased knowledge of use of DME, Decreased strength, Impaired perceived functional ability, Impaired flexibility, Decreased mobility, Impaired vision/preception, Other (comment)  Visit Diagnosis: Hemiplegia affecting right dominant side,  unspecified etiology, unspecified hemiplegia type (HCC)  Other abnormalities of gait and mobility  Unsteadiness on feet  Muscle weakness (generalized)     Problem List Patient Active Problem List   Diagnosis Date Noted  . Right hemiparesis (HCC) 10/05/2014  . Diabetic neuropathy (HCC) 09/15/2014  . GERD (gastroesophageal reflux disease) 09/15/2014  . Stroke (HCC) 08/18/2014  . CVA (cerebral infarction) 08/18/2014  . Hot flashes 12/03/2012  . High risk sexual behavior 05/13/2012  . Chronic low back pain 12/29/2011  . Obesity 10/23/2011  . Financial difficulties 10/23/2011  . Poor dentition 10/23/2011  . Paresthesia of left arm and leg 10/23/2011  . Irregular periods/menstrual cycles 09/12/2011  . Preventative health care 05/13/2011  . Dyslipidemia 04/01/2011  . Hypertension 03/27/2011  . History of stroke 03/13/2010  . Diabetes mellitus type 2, uncontrolled, with complications [facility](HCC) 02/11/2004    Elray McgregorCynthia Grecia Lynk 12/31/2015, 1:11 PM  Sheran Lawlessyndi Rafel Garde, [place] [phone] 12/31/2015   [facility] [address] [place], [place], [address] Phone: [phone]   Fax:  [phone]  Name: Michelle BoettcherWanda D Vanhise MRN: [id number] Date of Birth: 08/25/1965

## 2016-01-07 ENCOUNTER — Ambulatory Visit: Payer: Medicaid Other | Admitting: *Deleted

## 2016-01-16 ENCOUNTER — Ambulatory Visit: Payer: Medicaid Other | Attending: Neurology | Admitting: *Deleted

## 2016-01-16 DIAGNOSIS — G8191 Hemiplegia, unspecified affecting right dominant side: Secondary | ICD-10-CM | POA: Diagnosis present

## 2016-01-16 DIAGNOSIS — R2689 Other abnormalities of gait and mobility: Secondary | ICD-10-CM | POA: Insufficient documentation

## 2016-01-16 DIAGNOSIS — M6281 Muscle weakness (generalized): Secondary | ICD-10-CM | POA: Diagnosis present

## 2016-01-16 DIAGNOSIS — R2681 Unsteadiness on feet: Secondary | ICD-10-CM | POA: Diagnosis present

## 2016-01-16 NOTE — Patient Instructions (Signed)
Feet Apart, Varied Arm Positions - Eyes Open    With eyes open, feet shoulder width apart, standing in front of counter look straight ahead at a stationary object. Hold _5-10___ seconds. Repeat _3___ times per session. Do _2___ sessions per day.  Copyright  VHI. All rights reserved.  Forward Progression With 180 (Half) Turns    Walk making a slow half turn in place, leading with head and eyes, toward right every _5___ steps. Walk in a straight path forward between turns. Repeat sequence __5__ times per session. Do ___1_ sessions per day.  Walk with the cane; can do figure eight turns   Copyright  VHI. All rights reserved.  Side-Stepping    Walk to left side with eyes open. Take even steps, leading with same foot. Make sure each foot lifts off the floor. Repeat in opposite direction. Repeat for _1___ minutes per session. Do __3__ sessions per day.   Copyright  VHI. All rights reserved.

## 2016-01-16 NOTE — Therapy (Signed)
[facility] [address] [place], [place], [address] Phone: [phone]   Fax:  [phone]  Physical Therapy Treatment  Patient Details  Name: Michelle Brooks MRN: [id number] Date of Birth: 06/02/1965 Referring Provider: Rema Jasminelukayode O Onasanya, MD  Encounter Date: 01/16/2016      PT End of Session - 01/16/16 1547    Visit Number 3   Number of Visits 4   Date for PT Re-Evaluation 01/18/16   Authorization Type Medicaid   Authorization Time Period 3 visits authorized 12/28/15-01/17/16   Authorization - Visit Number 3   Authorization - Number of Visits 4   PT Start Time 1400   PT Stop Time 1447   PT Time Calculation (min) 47 min   Equipment Utilized During Treatment Gait belt   Activity Tolerance Patient tolerated treatment well   Behavior [facility] for tasks assessed/performed      Past Medical History:  Diagnosis Date  . Chronic back pain   . Diabetes mellitus type 2 in obese (HCC)   . Diabetic retinopathy (HCC)   . Headache   . 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  [name]. HSV-2 infection   . Hyperlipidemia LDL goal < 100 04/01/2011  . Hypertension     Past Surgical History:  Procedure Laterality Date  . BREAST LUMPECTOMY  2002   states benign lymph node removed.    There were no vitals filed for this visit.      Subjective Assessment - 01/16/16 1401    Subjective Reports went go get fitted for the brace.  Stated needs to get new shoes since current ones have too much air.  Missed last visit due to no transportation   Patient is accompained by: Family member  boyfriend   Currently in Pain? Yes   Pain Score 8    Pain Location Back   Pain Orientation Lower   Pain Descriptors / Indicators Constant   Pain Type Chronic pain   Pain Onset More than a month ago   Pain Frequency Intermittent   Aggravating Factors  rain/weather change   Pain Relieving Factors hot water, medication                         [facility] Adult PT Treatment/Exercise - 01/16/16 1413      Transfers   Sit to Stand 5: Supervision;With armrests   Sit to Stand Details (indicate cue type and reason) with cues for foot placement to increase LE use   Number of Reps Other reps (comment)  5     Ambulation/Gait   Ambulation/Gait Yes   Ambulation/Gait Assistance 5: Supervision   Ambulation/Gait Assistance Details for safety on turns figure of 8 x 3   Ambulation Distance (Feet) 45 Feet   Assistive device Small based quad cane   Gait Pattern Step-through pattern;Ataxic;Decreased stride length;Decreased dorsiflexion - right;Trunk flexed   Ambulation Surface Level;Indoor   Gait Comments around cones with cane and S increased time     Balance   Balance Assessed Yes     Static Standing Balance   Static  Standing - Balance Support No upper extremity supported   Static Standing - Level of Assistance 5: Stand by assistance   Static Standing - Comment/# of Minutes 5 sec x 3 reps at counter hands hovering     High Level Balance   High Level Balance Activities Side stepping;Turns     Lumbar Exercises: Supine   Bridge 5 reps   Bridge Limitations cues for abdominal activation and breath control   Other Supine Lumbar Exercises trunk rotation in small range in hooklying x 10 reps                PT Education - 01/16/16 1544    Education provided Yes   Education Details HEP, try to get new script for next year for traning with AFO once obtained, walk in home with cane at least   Person(s) Educated Patient   Methods  Explanation;Demonstration   Comprehension Verbalized understanding;Need further instruction          PT Short Term Goals - 12/19/15 1243      PT SHORT TERM GOAL #1   Title STG's = LTG's           PT Long Term Goals - 01/16/16 1552      PT LONG TERM GOAL #1   Title Pt will demonstrate independence with initial HEP to maximize functional gains made in PT.  (Target: 01/16/16)   Status On-going     PT LONG TERM GOAL #2   Title Orthotist will see patient (during PT session or in office) to assist in determining in pt appropriate candidate for AFO to maximize safety with fmobility.  (01/16/16)   Status Achieved  saw orthotist at his office     PT LONG TERM GOAL #3   Title Pt will negotiate 15 stairs with L rail (to ascend) with mod I using LRAD to enable pt to safely use primary home entrance.  (01/16/16)   Status On-going     PT LONG TERM GOAL #4   Title Pt will ambulate x100' over level, indoor surfaces with mod I using LRAD to indicate increased independence with household mobility.  (01/16/16)   Status On-going     PT LONG TERM GOAL #5   Title Pt will ambulate x200' over unlevel, paved surfaces including curb step and ramp with supervision using LRAD to indicate increased safety with community mobility.  (01/16/16)   Status On-going     PT LONG TERM GOAL #6   Title Pt will improve gait velocity from 0.59 ft/sec to >/= 0.89 ft/sec to indicate increased efficiency of mobility.  (01/16/16)   Status On-going               Plan - 01/16/16 1548    Clinical Impression Statement Patient able to demonstrate initial HEP with supervision min cues for breathing with bridging and for foot placement with sit to stand.  Seems stable with SBQC short distances, but legs get wobbly and pt with back pain that inhibits longer distances.  Still safest with walker, but needs at least cane in the home for fall prevention.  Needs one more visit for HEP review and fall prevention as well as goal  assessment with Medicaid end date tomorrow.    Clinical Impairments Affecting Rehab Potential financial/insurance limitations; chronicity of impairments   PT Duration 3 weeks   PT Treatment/Interventions ADLs/Self Care Home Management;DME Instruction;Gait training;Stair training;Functional mobility training;Therapeutic activities;Patient/family education;Orthotic Fit/Training;Therapeutic exercise;Balance training;Neuromuscular re-education;Passive range of motion;Manual techniques;Visual/perceptual remediation/compensation;Vestibular   PT  Next Visit Plan check balance HEP, fall prevention education, check goals and d/c.   Consulted and Agree with Plan of Care Patient   Family Member Consulted Les [place]ou[place] boyfriend      Patient will benefit from skilled therapeutic intervention in order to improve the following deficits and impairments:     Visit Diagnosis: Hemiplegia affecting right dominant side, unspecified etiology, unspecified hemiplegia type (HCC)  Other abnormalities of gait and mobility  Unsteadiness on feet  Muscle weakness (generalized)     Problem List Patient Active Problem List   Diagnosis Date Noted  . Right hemiparesis (HCC) 10/05/2014  . Diabetic neuropathy (HCC) 09/15/2014  . GERD (gastroesophageal reflux disease) 09/15/2014  . Stroke (HCC) 08/18/2014  . CVA (cerebral infarction) 08/18/2014  . Hot flashes 12/03/2012  . High risk sexual behavior 05/13/2012  . Chronic low back pain 12/29/2011  . Obesity 10/23/2011  . Financial difficulties 10/23/2011  . Poor dentition 10/23/2011  . Paresthesia of left arm and leg 10/23/2011  . Irregular periods/menstrual cycles 09/12/2011  . Preventative health care 05/13/2011  . Dyslipidemia 04/01/2011  . Hypertension 03/27/2011  . History of stroke 03/13/2010  . Diabetes mellitus type 2, uncontrolled, with complications [facility](HCC) 02/11/2004    Elray McgregorCynthia Crissa Sowder 01/16/2016, 3:57 PM  Sheran Lawlessyndi Swara [place], [place] [phone] 01/16/2016   [facility] [address] [place], [place], [address] Phone: [phone]   Fax:  [phone]  Name: Michelle Brooks MRN: [id number] Date of Birth: 04/30/1965

## 2016-01-17 ENCOUNTER — Encounter: Payer: Self-pay | Admitting: Rehabilitation

## 2016-01-17 ENCOUNTER — Ambulatory Visit: Payer: Medicaid Other | Admitting: Rehabilitation

## 2016-01-17 DIAGNOSIS — R2689 Other abnormalities of gait and mobility: Secondary | ICD-10-CM

## 2016-01-17 DIAGNOSIS — G8191 Hemiplegia, unspecified affecting right dominant side: Secondary | ICD-10-CM | POA: Diagnosis not present

## 2016-01-17 DIAGNOSIS — R2681 Unsteadiness on feet: Secondary | ICD-10-CM

## 2016-01-17 DIAGNOSIS — M6281 Muscle weakness (generalized): Secondary | ICD-10-CM

## 2016-01-17 NOTE — Therapy (Signed)
[place] [address] [place], [place], [address] Phone: [phone]   Fax:  [phone]  Physical Therapy Treatment and DC Summary  Patient Details  Name: Michelle Brooks MRN: [id number] Date of Birth: 11-21-1965 Referring Provider: Melburn Popper, MD  Encounter Date: 01/17/2016      PT End of Session - 01/17/16 1809    Visit Number 4   Number of Visits 4   Date for PT Re-Evaluation 01/18/16   Authorization Type Medicaid   Authorization Time Period 3 visits authorized 12/28/15-01/17/16   Authorization - Visit Number 4   Authorization - Number of Visits 4   PT Start Time 1497  pt late to appt   PT Stop Time 1619   PT Time Calculation (min) 38 min   Equipment Utilized During Treatment Gait belt   Activity Tolerance Patient tolerated treatment well   Behavior During Therapy [facility] for tasks assessed/performed      Past Medical History:  Diagnosis Date  . Chronic back pain   . Diabetes mellitus type 2 in obese ([place])   . Diabetic retinopathy ([place])   . Headache   . 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  [name] HSV-2 infection   . Hyperlipidemia LDL goal < 100 04/01/2011  . Hypertension     Past Surgical History:  Procedure Laterality Date  . BREAST LUMPECTOMY  2002   states benign lymph node removed.    There were no vitals filed for this visit.      Subjective Assessment - 01/17/16 1544    Subjective Reports she has an appt on Dec 21st to hopefully get brace, but needs a perscription for new shoes as well (diabetic).    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   Patient Stated Goals "I wanna walk.Marland KitchenMarland KitchenI wanna get back on my feet."   Currently in Pain? No/denies                         [facility] Adult PT Treatment/Exercise - 01/17/16 0001      Transfers   Transfers Sit to Stand;Stand to Sit   Sit to Stand 6: Modified independent (Device/Increase time)   Stand to Sit 6: Modified independent (Device/Increase time)     Ambulation/Gait   Ambulation/Gait Yes   Ambulation/Gait Assistance 6: Modified independent (Device/Increase time)   Ambulation/Gait Assistance Details Pt able to ambulate over indoor surfaces up to 120' with RW at mod I level.  Recommend continued use of RW in home for safety and improved gait efficiency and quality.     Ambulation Distance (Feet) 120 Feet  then another 115'   Assistive device Rolling walker   Gait Pattern Step-through pattern;Ataxic;Decreased stride length;Decreased dorsiflexion - right;Trunk flexed   Ambulation Surface Level;Indoor   Stairs Yes   Stairs Assistance 6: Modified  independent (Device/Increase time);5: Supervision   Stairs Assistance Details (indicate cue type and reason) Pt requires S for safety when ascending/descending forwards with L rail and SBQC, however can perform sideways at mod I level and encouraged her to perform several times a day for exercise    Stair Management Technique One rail Left;Step to pattern;Sideways;Forwards;With cane   Number of Stairs 4  x 4 reps   Height of Stairs 6     Neuro Re-ed    Neuro Re-ed Details  Gait without AD x 60' with UEs placed on PTs shoulders for improved postural control with emphasis on getting improved hip protraction during  stance phase of gait.  Note marked improvement, however when given RW to assess for carryover, note she tends to slow gait even more to focus on  hip protraction.                 PT Education - 01/17/16 1546    Education provided Yes   Education Details Encouragement to perform stairs more often for exercise, gaith with RW at all times, returning to therapy in new year once has AFO   Person(s) Educated Patient   Methods Explanation   Comprehension Verbalized understanding          PT Short Term Goals - 12/19/15 1243      PT SHORT TERM GOAL #1   Title STG's = LTG's           PT Long Term Goals - 01/17/16 1547      PT LONG TERM GOAL #1   Title Pt will demonstrate independence with initial HEP to maximize functional gains made in PT.  (Target: 01/16/16)   Baseline met verbally 01/17/16 (did not perform due to time)   Status Achieved     PT LONG TERM GOAL #2   Title Orthotist will see patient (during PT session or in office) to assist in determining in pt appropriate candidate for AFO to maximize safety with fmobility.  (01/16/16)   Status Achieved  saw orthotist at his office     PT LONG TERM GOAL #3   Title Pt will negotiate 15 stairs with L rail (to ascend) with mod I using LRAD to enable pt to safely use primary home entrance.  (01/16/16)   Baseline S with use of cane, however mod I when ascending/descending sideways with L rail.    Status Achieved     PT LONG TERM GOAL #4   Title Pt will ambulate x100' over level, indoor surfaces with mod I using LRAD to indicate increased independence with household mobility.  (01/16/16)   Baseline met 01/17/16   Status Achieved     PT LONG TERM GOAL #5   Title Pt will ambulate x200' over unlevel, paved surfaces including curb step and ramp with supervision using LRAD to indicate increased safety with community mobility.  (01/16/16)   Baseline Did not have time to assess, however she ambulated into clinic with RW at S level, feel  that she could likely do at mod I level with RW   Status Partially Met     PT LONG TERM GOAL #6   Title Pt will improve gait velocity from 0.59 ft/sec to >/= 0.89 ft/sec to indicate increased efficiency of mobility.  (01/16/16)   Baseline did not formally assess due to time   Status Unable to assess               Plan - 01/17/16 1812  Clinical Impression Statement Skilled session focused on assessment of LTGs with pt meeting 4/6 goals, partially meeting one goal and did not have time to formally assess gait speed goal.  Note that she demonstrates improved fluidity and speed with RW vs SBQC.  Recommend that she use RW at all times for safety and improved efficiency of gait as well as returning to therapy in new year once she has AFO.  Pt verbalized understanding.     Clinical Impairments Affecting Rehab Potential financial/insurance limitations; chronicity of impairments   PT Duration 3 weeks   PT Treatment/Interventions ADLs/Self Care Home Management;DME Instruction;Gait training;Stair training;Functional mobility training;Therapeutic activities;Patient/family education;Orthotic Fit/Training;Therapeutic exercise;Balance training;Neuromuscular re-education;Passive range of motion;Manual techniques;Visual/perceptual remediation/compensation;Vestibular   Consulted and Agree with Plan of Care Patient   Family Member Consulted Wilber Oliphant boyfriend      Patient will benefit from skilled therapeutic intervention in order to improve the following deficits and impairments:     Visit Diagnosis: Hemiplegia affecting right dominant side, unspecified etiology, unspecified hemiplegia type ([place])  Other abnormalities of gait and mobility  Unsteadiness on feet  Muscle weakness (generalized)  PHYSICAL THERAPY DISCHARGE SUMMARY  Visits from Start of Care: 4  Current functional level related to goals / functional outcomes: See goals above   Remaining deficits: Pt continues to have significant  endurance and gait efficiency/mechanic deficits.  Note that she is getting AFO by end of year and will hopefully return to therapy for 4 more visits in new year.     Education / Equipment: HEP  Plan: Patient agrees to discharge.  Patient goals were partially met. Patient is being discharged due to meeting the stated rehab goals.  ?????        Problem List Patient Active Problem List   Diagnosis Date Noted  . Right hemiparesis ([place]) 10/05/2014  . Diabetic neuropathy ([place]) 09/15/2014  . GERD (gastroesophageal reflux disease) 09/15/2014  . Stroke ([place]) 08/18/2014  . CVA (cerebral infarction) 08/18/2014  . Hot flashes 12/03/2012  . High risk sexual behavior 05/13/2012  . Chronic low back pain 12/29/2011  . Obesity 10/23/2011  . Financial difficulties 10/23/2011  . Poor dentition 10/23/2011  . Paresthesia of left arm and leg 10/23/2011  . Irregular periods/menstrual cycles 09/12/2011  . Preventative health care 05/13/2011  . Dyslipidemia 04/01/2011  . Hypertension 03/27/2011  . History of stroke 03/13/2010  . Diabetes mellitus type 2, uncontrolled, with complications ([place]) 83/25/4982    Cameron Sprang, PT, MPT [facility] [address] [place], [place], [address] Phone: [phone]   Fax:  [phone] 01/17/16, 6:16 PM  Name: Michelle Brooks MRN: [id number] Date of Birth: September 15, 1965

## 2016-01-18 ENCOUNTER — Ambulatory Visit: Payer: Medicaid Other | Admitting: Physical Therapy

## 2016-03-25 ENCOUNTER — Ambulatory Visit: Payer: Medicaid Other | Attending: Internal Medicine | Admitting: Physical Therapy

## 2016-03-25 ENCOUNTER — Encounter: Payer: Medicaid Other | Admitting: Occupational Therapy

## 2016-03-25 DIAGNOSIS — R2681 Unsteadiness on feet: Secondary | ICD-10-CM | POA: Insufficient documentation

## 2016-03-25 DIAGNOSIS — G8191 Hemiplegia, unspecified affecting right dominant side: Secondary | ICD-10-CM

## 2016-03-25 DIAGNOSIS — R2689 Other abnormalities of gait and mobility: Secondary | ICD-10-CM

## 2016-03-25 DIAGNOSIS — M6281 Muscle weakness (generalized): Secondary | ICD-10-CM | POA: Diagnosis present

## 2016-03-25 NOTE — Therapy (Signed)
[facility] [address] [place], [place], [address] Phone: [phone]   Fax:  [phone]  Physical Therapy Evaluation  Patient Details  Name: Michelle Brooks MRN: [id number] Date of Birth: [birth year] Referring Provider: Fleet Contras, MD  Encounter Date: 03/25/2016      PT End of Session - 03/25/16 1213    Visit Number 1   Number of Visits 4   Date for PT Re-Evaluation 05/06/16   Authorization Type Medicaid   PT Start Time 1100   PT Stop Time 1145   PT Time Calculation (min) 45 min   Equipment Utilized During Treatment Gait belt   Activity Tolerance Patient tolerated treatment well   Behavior During Therapy [facility] for tasks assessed/performed      Past Medical History:  Diagnosis Date  . Chronic back pain   . Diabetes mellitus type 2 in obese (HCC)   . Diabetic retinopathy (HCC)   . Headache   . History of stroke 03/2010   Right MCA stroke in 03/2010, with MRI also noting subacute strokes in left fronto parietal area - occured in [place] received care at [facility] thought due to uncontrolled blood pressure // No residual deficits // TTE (03/2010) at [facility] - normal LV systolic function, moderate pericardial effusion with 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  [name] HSV-2 infection   . Hyperlipidemia LDL goal < 100 04/01/2011  . Hypertension     Past Surgical History:  Procedure Laterality Date  . BREAST LUMPECTOMY  2002   states benign lymph node removed.    There were no vitals filed for this visit.       Subjective Assessment - 03/25/16 1102    Subjective Pt is a 51 y/o female who returns to OPPT s/p CVA July 2016.  Pt seen at this facility in Dec 2017, and AFO obtained, which pt is using today.   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   Patient Stated Goals "to get me back on my feet so I can walk without this" (pointing to RW)   Currently in Pain? Yes   Pain Score 7    Pain Location Back   Pain Orientation Lower   Pain Descriptors / Indicators Constant   Pain Type Chronic pain   Pain Onset More than a month ago   Pain Frequency Intermittent   Aggravating Factors  rain/weather change   Pain Relieving Factors hot water, medication   Effect of Pain on Daily Activities LBP present prior to CVA but affecting functional mobility - will attempt to address through exercise            [facility] PT Assessment - 03/25/16 1105      Assessment   Medical Diagnosis CVA   Referring Provider Fleet Contras, MD   Onset Date/Surgical Date 08/18/14   Hand Dominance Right     Precautions   Precautions Fall     Restrictions   Weight Bearing Restrictions No     Balance Screen   Has the patient fallen in the past 6 months No   Has the patient had a decrease in activity level because of a fear of falling?  Yes   Is the patient  reluctant to leave their home because of a fear of falling?  Yes     Home Environment   Living Environment Private residence   Living Arrangements --  daughter   Type of Home Other(Comment)  townhouse   Home Access Stairs to enter   Entrance Stairs-Number of Steps 15   Entrance Stairs-Rails Left   Home Layout One level   Home Equipment Walker - 2 wheels;Cane - quad     Prior Function   Level of Independence Independent   Vocation Unemployed     Cognition   Overall Cognitive Status Within Functional Limits for tasks assessed     Strength   Overall Strength Comments tested in sitting   Right/Left Hip Right;Left   Right Hip Flexion 3+/5   Left Hip Flexion 4/5   Right/Left Knee Left;Right   Right Knee Flexion 3+/5   Right Knee Extension 3+/5   Left Knee Flexion 4/5    Left Knee Extension 4/5   Right Ankle Dorsiflexion 3/5     Transfers   Transfers Sit to Stand;Stand to Sit   Sit to Stand 6: Modified independent (Device/Increase time)   Stand to Sit 6: Modified independent (Device/Increase time)     Ambulation/Gait   Ambulation/Gait Yes   Ambulation/Gait Assistance 5: Supervision;4: Min guard   Ambulation/Gait Assistance Details supervision with RW, minguard A with SBQC   Ambulation Distance (Feet) 150 Feet  total; max 40' without rest break   Assistive device Rolling walker;Small based quad cane   Gait Pattern Step-through pattern;Ataxic;Decreased stride length;Decreased dorsiflexion - right;Trunk flexed;Step-to pattern   Ambulation Surface Level;Indoor   Gait velocity RW: 0.49 ft/sec; SBQC: 0.39 ft/sec  RW: 66.63 sec; SBQC: 83.31 sec     Standardized Balance Assessment   Standardized Balance Assessment Timed Up and Go Test     Timed Up and Go Test   Normal TUG (seconds) 47.22  with RW   TUG Comments 53.4 sec with [facility]                           PT Education - 03/25/16 1207    Education provided Yes   Education Details clinical findings, POC, goals of care, AHOY   Person(s) Educated Patient   Methods Explanation   Comprehension Verbalized understanding          PT Short Term Goals - 12/19/15 1243      PT SHORT TERM GOAL #1   Title STG's = LTG's           PT Long Term Goals - 03/25/16 1218      PT LONG TERM GOAL #1   Title Pt will demonstrate independence with HEP to maximize functional gains made in PT.  (Target: 05/06/16)   Baseline has small HEP from prior PT   Status New     PT LONG TERM GOAL #2   Title improve gait velocity to > 0.75 ft/sec with RW for improved mobility (05/06/16)   Baseline RW: 0.49 ft/sec   Status New     PT LONG TERM GOAL #3   Title improve timed up and go to < 43 sec with SBQC for improved mobility with household distances (05/06/16)   Baseline SBQC: 53.4 sec   Status New      PT LONG TERM GOAL #4   Title amb > 150' with SBQC and supervision on level indoor/paved outdoor surfaces for improved community access (05/06/16)   Baseline amb 45' with  SBQC and minguard A   Status New     PT LONG TERM GOAL #5   Title pt will report back pain < 5/10 with ambulation with LRAD for improved mobilty and decreased pain (05/06/16)   Baseline pain 7/10 at rest   Status New     PT LONG TERM GOAL #6   Title n/a   Baseline n/a               Plan - 03/25/16 1213    Clinical Impression Statement Pt is a 51 y/o female who presents to OPPT for moderate complexity PT eval s/p multiple CVAs (most recent 08/18/14) resulting in bil LE weakness and decreased balance and mobility.  Pt will benefit from PT to address deficits and maximize function.  [facility] and supervision for short commuinty distances to improve functional mobility.     Rehab Potential Good   Clinical Impairments Affecting Rehab Potential financial/insurance limitations; chronicity of impairments   PT Frequency Biweekly   PT Duration 6 weeks   PT Treatment/Interventions ADLs/Self Care Home Management;DME Instruction;Gait training;Stair training;Functional mobility training;Therapeutic activities;Patient/family education;Orthotic Fit/Training;Therapeutic exercise;Balance training;Neuromuscular re-education;Passive range of motion;Manual techniques;Visual/perceptual remediation/compensation;Vestibular   PT Next Visit Plan look at prior HEP and update/review, add back stretches and core strengthening exercises, gait with SBQC   Consulted and Agree with Plan of Care Patient      Patient will benefit from skilled therapeutic intervention in order to improve the following deficits and impairments:  Abnormal gait, Decreased balance, Decreased coordination, Decreased knowledge of use of DME, Decreased strength, Impaired perceived functional ability, Impaired flexibility, Decreased mobility,  Impaired vision/preception, Decreased activity tolerance  Visit Diagnosis: Hemiplegia affecting right dominant side, unspecified etiology, unspecified hemiplegia type (HCC) - Plan: PT plan of care cert/re-cert  Other abnormalities of gait and mobility - Plan: PT plan of care cert/re-cert  Unsteadiness on feet - Plan: PT plan of care cert/re-cert  Muscle weakness (generalized) - Plan: PT plan of care cert/re-cert     Problem List Patient Active Problem List   Diagnosis Date Noted  . Right hemiparesis (HCC) 10/05/2014  . Diabetic neuropathy (HCC) 09/15/2014  . GERD (gastroesophageal reflux disease) 09/15/2014  . Stroke (HCC) 08/18/2014  . CVA (cerebral infarction) 08/18/2014  . Hot flashes 12/03/2012  . High risk sexual behavior 05/13/2012  . Chronic low back pain 12/29/2011  . Obesity 10/23/2011  . Financial difficulties 10/23/2011  . Poor dentition 10/23/2011  . Paresthesia of left arm and leg 10/23/2011  . Irregular periods/menstrual cycles 09/12/2011  . Preventative health care 05/13/2011  . Dyslipidemia 04/01/2011  . Hypertension 03/27/2011  . History of stroke 03/13/2010  . Diabetes mellitus type 2, uncontrolled, with complications (HCC) 02/11/2004       Clarita CraneStephanie F Reanna Scoggin, PT, DPT 03/25/16 12:29 PM     Outpt Rehabilitation [facility] [address] [place], [place], [address] Phone: [phone]   Fax:  [phone]  Name: Maurie BoettcherWanda D Brooks MRN: [id number] Date of Birth: 05/18/1965

## 2016-04-08 ENCOUNTER — Other Ambulatory Visit (HOSPITAL_COMMUNITY)
Admission: RE | Admit: 2016-04-08 | Discharge: 2016-04-08 | Disposition: A | Discharge status: Home / Self Care | Payer: Medicaid Other | Source: Ambulatory Visit | Attending: Student | Admitting: Student

## 2016-04-08 ENCOUNTER — Ambulatory Visit: Payer: Medicaid Other | Admitting: Physical Therapy

## 2016-04-08 ENCOUNTER — Encounter: Payer: Self-pay | Admitting: Student

## 2016-04-08 ENCOUNTER — Ambulatory Visit (INDEPENDENT_AMBULATORY_CARE_PROVIDER_SITE_OTHER): Payer: Medicaid Other | Admitting: Student

## 2016-04-08 VITALS — BP 165/98 | HR 86

## 2016-04-08 VITALS — BP 144/83 | HR 74 | Ht 68.0 in | Wt 244.8 lb

## 2016-04-08 DIAGNOSIS — Z01419 Encounter for gynecological examination (general) (routine) without abnormal findings: Secondary | ICD-10-CM

## 2016-04-08 DIAGNOSIS — Z0000 Encounter for general adult medical examination without abnormal findings: Secondary | ICD-10-CM

## 2016-04-08 DIAGNOSIS — Z113 Encounter for screening for infections with a predominantly sexual mode of transmission: Secondary | ICD-10-CM | POA: Diagnosis present

## 2016-04-08 DIAGNOSIS — N760 Acute vaginitis: Secondary | ICD-10-CM

## 2016-04-08 DIAGNOSIS — G8191 Hemiplegia, unspecified affecting right dominant side: Secondary | ICD-10-CM

## 2016-04-08 DIAGNOSIS — A6004 Herpesviral vulvovaginitis: Secondary | ICD-10-CM

## 2016-04-08 DIAGNOSIS — Z1151 Encounter for screening for human papillomavirus (HPV): Secondary | ICD-10-CM | POA: Insufficient documentation

## 2016-04-08 DIAGNOSIS — R2689 Other abnormalities of gait and mobility: Secondary | ICD-10-CM

## 2016-04-08 DIAGNOSIS — M6281 Muscle weakness (generalized): Secondary | ICD-10-CM

## 2016-04-08 DIAGNOSIS — R2681 Unsteadiness on feet: Secondary | ICD-10-CM

## 2016-04-08 MED ORDER — VALACYCLOVIR HCL 1 G PO TABS: 1000.0000 mg | ORAL_TABLET | Freq: Every day | ORAL | 0 refills | Status: DC

## 2016-04-08 MED ORDER — NYSTATIN-TRIAMCINOLONE 100000-0.1 UNIT/GM-% EX OINT: 1.0000 "application " | TOPICAL_OINTMENT | Freq: Two times a day (BID) | CUTANEOUS | 0 refills | Status: DC

## 2016-04-08 NOTE — Patient Instructions (Signed)
Genital Herpes Genital herpes is a common sexually transmitted infection (STI) that is caused by a virus. The virus spreads from person to person through sexual contact. Infection can cause itching, blisters, and sores around the genitals or rectum. Symptoms may last several days and then go away This is called an outbreak. However, the virus remains in your body, so you may have more outbreaks in the future. The time between outbreaks varies and can be months or years. Genital herpes affects men and women. It is particularly concerning for pregnant women because the virus can be passed to the baby during delivery and can cause serious problems. Genital herpes is also a concern for people who have a weak disease-fighting (immune) system. What are the causes? This condition is caused by the herpes simplex virus (HSV) type 1 or type 2. The virus may spread through:  Sexual contact with an infected person, including vaginal, anal, and oral sex.  Contact with fluid from a herpes sore.  The skin. This means that you can get herpes from an infected partner even if he or she does not have a visible sore or does not know that he or she is infected. What increases the risk? You are more likely to develop this condition if:  You have sex with many partners.  You do not use latex condoms during sex. What are the signs or symptoms? Most people do not have symptoms (asymptomatic) or have mild symptoms that may be mistaken for other skin problems. Symptoms may include:  Small red bumps near the genitals, rectum, or mouth. These bumps turn into blisters and then turn into sores.  Flu-like symptoms, including:  Fever.  Body aches.  Swollen lymph nodes.  Headache.  Painful urination.  Pain and itching in the genital area or rectal area.  Vaginal discharge.  Tingling or shooting pain in the legs and buttocks. Generally, symptoms are more severe and last longer during the first (primary)  outbreak. Flu-like symptoms are also more common during the primary outbreak. How is this diagnosed? Genital herpes may be diagnosed based on:  A physical exam.  Your medical history.  Blood tests.  A test of a fluid sample (culture) from an open sore. How is this treated? There is no cure for this condition, but treatment with antiviral medicines that are taken by mouth (orally) can do the following:  Speed up healing and relieve symptoms.  Help to reduce the spread of the virus to sexual partners.  Limit the chance of future outbreaks, or make future outbreaks shorter.  Lessen symptoms of future outbreaks. Your health care provider may also recommend pain relief medicines, such as aspirin or ibuprofen. Follow these instructions at home: Sexual activity   Do not have sexual contact during active outbreaks.  Practice safe sex. Latex condoms and female condoms may help prevent the spread of the herpes virus. General instructions   Keep the affected areas dry and clean.  Take over-the-counter and prescription medicines only as told by your health care provider.  Avoid rubbing or touching blisters and sores. If you do touch blisters or sores:  Wash your hands thoroughly with soap and water.  Do not touch your eyes afterward.  To help relieve pain or itching, you may take the following actions as directed by your health care provider:  Apply a cold, wet cloth (cold compress) to affected areas 4-6 times a day.  Apply a substance that protects your skin and reduces bleeding (astringent).  Apply  a gel that helps relieve pain around sores (lidocaine gel).  Take a warm, shallow bath that cleans the genital area (sitz bath).  Keep all follow-up visits as told by your health care provider. This is important. How is this prevented?  Use condoms. Although anyone can get genital herpes during sexual contact, even with the use of a condom, a condom can provide some  protection.  Avoid having multiple sexual partners.  Talk with your sexual partner about any symptoms either of you may have. Also, talk with your partner about any history of STIs.  Get tested for STIs before you have sex. Ask your partner to do the same.  Do not have sexual contact if you have symptoms of genital herpes. Contact a health care provider if:  Your symptoms are not improving with medicine.  Your symptoms return.  You have new symptoms.  You have a fever.  You have abdominal pain.  You have redness, swelling, or pain in your eye.  You notice new sores on other parts of your body.  You are a woman and experience bleeding between menstrual periods.  You have had herpes and you become pregnant or plan to become pregnant. Summary  Genital herpes is a common sexually transmitted infection (STI) that is caused by the herpes simplex virus (HSV) type 1 or type 2.  These viruses are most often spread through sexual contact with an infected person.  You are more likely to develop this condition if you have sex with many partners or you have unprotected sex.  Most people do not have symptoms (asymptomatic) or have mild symptoms that may be mistaken for other skin problems. Symptoms occur as outbreaks that may happen months or years apart.  There is no cure for this condition, but treatment with oral antiviral medicines can reduce symptoms, reduce the chance of spreading the virus to a partner, prevent future outbreaks, or shorten future outbreaks. This information is not intended to replace advice given to you by your health care provider. Make sure you discuss any questions you have with your health care provider. Document Released: 01/25/2000 Document Revised: 12/28/2015 Document Reviewed: 12/28/2015 Elsevier Interactive Patient Education  2017 [employer]     Vaginitis Vaginitis is an inflammation of the vagina. It can happen when the normal bacteria and yeast  in the vagina grow too much. There are different types. Treatment will depend on the type you have. Follow these instructions at home:  Take all medicines as told by your doctor.  Keep your vagina area clean and dry. Avoid soap. Rinse the area with water.  Avoid washing and cleaning out the vagina (douching).  Do not use tampons or have sex (intercourse) until your treatment is done.  Wipe from front to back after going to the restroom.  Wear cotton underwear.  Avoid wearing underwear while you sleep until your vaginitis is gone.  Avoid tight pants. Avoid underwear or nylons without a cotton panel.  Take off wet clothing (such as a bathing suit) as soon as you can.  Use mild, unscented products. Avoid fabric softeners and scented:  Feminine sprays.  Laundry detergents.  Tampons.  Soaps or bubble baths.  Practice safe sex and use condoms. Get help right away if:  You have belly (abdominal) pain.  You have a fever or lasting symptoms for more than 2-3 days.  You have a fever and your symptoms suddenly get worse. This information is not intended to replace advice given to you by  your health care provider. Make sure you discuss any questions you have with your health care provider. Document Released: 04/25/2008 Document Revised: 07/05/2015 Document Reviewed: 07/10/2011 Elsevier Interactive Patient Education  2017 [employer].

## 2016-04-08 NOTE — Therapy (Signed)
[facility] [address] [place], [place], [address] Phone: [phone]   Fax:  [phone]  Physical Therapy Treatment  Patient Details  Name: Michelle BoettcherWanda D Barsanti MRN: [id number] Date of Birth: 04/25/1965 Referring Provider: Fleet ContrasEdwin Avbuere, MD  Encounter Date: 04/08/2016      PT End of Session - 04/08/16 1432    Visit Number 2   Number of Visits 4   Date for PT Re-Evaluation 05/06/16   Authorization Type Medicaid-3 visits   Authorization Time Period 04/04/16-05/15/16   Authorization - Visit Number 1   Authorization - Number of Visits 3   PT Start Time 1315   PT Stop Time 1427   PT Time Calculation (min) 72 min   Activity Tolerance Patient tolerated treatment well   Behavior During Therapy [facility] for tasks assessed/performed      Past Medical History:  Diagnosis Date  . Chronic back pain   . Diabetes mellitus type 2 in obese (HCC)   . Diabetic retinopathy (HCC)   . Headache   . History of stroke 03/2010   Right MCA stroke in 03/2010, with MRI also noting subacute strokes in left fronto parietal area [place] [facility] to uncontrolled [facility] // No residual deficits // [facility]2010[facility] - normal LV systolic function, moderate pericardial effusion with 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  [name]. HSV-2 infection   . Hyperlipidemia LDL goal < 100 04/01/2011  . Hypertension     Past Surgical History:  Procedure Laterality Date  . BREAST LUMPECTOMY  2002   states benign lymph node removed.    Vitals:   04/08/16 1328 04/08/16 1411 04/08/16 1416  BP: (!) 158/98 (!) 172/94 (!) 165/98  Pulse: 84 86         Subjective Assessment - 04/08/16 1328    Subjective doing well; no falls.  reports she's having to use pull ups to keep from having accidents in the house.   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   Patient Stated Goals "to get me back on my feet so I can walk without this" (pointing to RW)   Currently in Pain? Yes   Pain Score 6    Pain Location Back   Pain Orientation Lower   Pain Descriptors / Indicators Constant;Aching;Spasm   Pain Type Chronic pain   Pain Onset More than a month ago   Pain Frequency Intermittent   Aggravating Factors  rain/weather changes   Pain Relieving Factors heat, meds                         [facility] Adult PT Treatment/Exercise - 04/08/16 1335      Lumbar Exercises: Stretches   Single Knee to Chest Stretch 3 reps;30 seconds   Single Knee to Chest Stretch Limitations bil   Lower Trunk Rotation 3 reps;30 seconds     Lumbar Exercises: Supine   Ab Set 10 reps;5 seconds   Bridge 5 reps;5 seconds   Other Supine Lumbar Exercises single limb hip abduction x 10 bil with green theraband     Knee/Hip Exercises: Standing   Hip Flexion Both;10 reps  Hip Flexion Limitations alt with bil UE support   Hip Abduction Both;10 reps;Knee straight   Abduction Limitations alt with bil UE support   Hip Extension Both;10 reps;Knee straight   Extension Limitations alt with bil UE support     Knee/Hip Exercises: Seated   Sit to Sand 5 reps;with UE support                PT Education - 04/08/16 1432    Education provided Yes   Education Details HEP   Person(s) Educated Patient   Methods Explanation;Demonstration;Handout   Comprehension Verbalized understanding;Returned demonstration          PT Short Term Goals - 12/19/15 1243      PT SHORT TERM GOAL #1   Title STG's = LTG's           PT Long Term Goals - 03/25/16 1218      PT LONG TERM GOAL #1   Title Pt will demonstrate independence with HEP to  maximize functional gains made in PT.  (Target: 05/06/16)   Baseline has small HEP from prior PT   Status New     PT LONG TERM GOAL #2   Title improve gait velocity to > 0.75 ft/sec with RW for improved mobility (05/06/16)   Baseline RW: 0.49 ft/sec   Status New     PT LONG TERM GOAL #3   Title improve timed up and go to < 43 sec with SBQC for improved mobility with household distances (05/06/16)   Baseline SBQC: 53.4 sec   Status New     PT LONG TERM GOAL #4   Title amb > 150' with SBQC and supervision on level indoor/paved outdoor surfaces for improved community access (05/06/16)   Baseline amb 45' with SBQC and minguard A   Status New     PT LONG TERM GOAL #5   Title pt will report back pain < 5/10 with ambulation with LRAD for improved mobilty and decreased pain (05/06/16)   Baseline pain 7/10 at rest   Status New     PT LONG TERM GOAL #6   Title n/a   Baseline n/a               Plan - 04/08/16 1433    Clinical Impression Statement Extended session today focused on reviewing and updating HEP.  Pt needs frequent rest breaks as she fatigues easily.  Added to HEP to address balance and strengthening as well as back pain.  Will continue to benefit from PT to maximize functional mobility and independence.  May benefit from power w/c or scooter due to hx of falls and incontinence.  Briefly discussed with pt and recommended she discuss with MD.    Clinical Impairments Affecting Rehab Potential financial/insurance limitations; chronicity of impairments   PT Treatment/Interventions ADLs/Self Care Home Management;DME Instruction;Gait training;Stair training;Functional mobility training;Therapeutic activities;Patient/family education;Orthotic Fit/Training;Therapeutic exercise;Balance training;Neuromuscular re-education;Passive range of motion;Manual techniques;Visual/perceptual remediation/compensation;Vestibular   PT Next Visit Plan gait with SBQC, add to HEP PRN   Consulted and Agree  with Plan of Care Patient      Patient will benefit from skilled therapeutic intervention in order to improve the following deficits and impairments:  Abnormal gait, Decreased balance, Decreased coordination, Decreased knowledge of use of DME, Decreased strength, Impaired perceived functional ability, Impaired flexibility, Decreased mobility, Impaired vision/preception, Decreased activity tolerance  Visit Diagnosis: Hemiplegia affecting right dominant side, unspecified etiology, unspecified hemiplegia type (HCC)  Other abnormalities of gait and mobility  Unsteadiness on  feet  Muscle weakness (generalized)     Problem List Patient Active Problem List   Diagnosis Date Noted  . Right hemiparesis (HCC) 10/05/2014  . Diabetic neuropathy (HCC) 09/15/2014  . GERD (gastroesophageal reflux disease) 09/15/2014  . Stroke (HCC) 08/18/2014  . CVA (cerebral infarction) 08/18/2014  . Hot flashes 12/03/2012  . High risk sexual behavior 05/13/2012  . Chronic low back pain 12/29/2011  . Obesity 10/23/2011  . Financial difficulties 10/23/2011  . Poor dentition 10/23/2011  . Paresthesia of left arm and leg 10/23/2011  . Irregular periods/menstrual cycles 09/12/2011  . Preventative health care 05/13/2011  . Dyslipidemia 04/01/2011  . Hypertension 03/27/2011  . History of stroke 03/13/2010  . Diabetes mellitus type 2, uncontrolled, with complications (HCC) 02/11/2004      Clarita Crane, PT, DPT 04/08/16 2:36 PM    [facility] [address] [place], [place], [address] Phone: [phone]   Fax:  [phone]  Name: ANNELLE BEHRENDT MRN: [id number] Date of Birth: Oct 27, 1965

## 2016-04-08 NOTE — Progress Notes (Signed)
Subjective:     Michelle Brooks is a 51 y.o. female here for a routine exam.  Current complaints: vaginal discharge & irritation.  Personal health questionnaire reviewed: yes.  Vaginal irritation & discharge since beginning of February. Discharge was clear, now yellow. No odor. No dyspareunia or PC bleeding. Did self swab last month at PCP; says everything was ok but her PCP gave her 4 pills to take at once. Uses FDS body wash & vinegar douches Uses condoms. Same partner for 7 yrs. Hx of HSV, diagnosed last year. Doesn't think she's had an outbreak since then.    Gynecologic History Patient's last menstrual period was 03/30/2016. Contraception: condoms Last Pap: 2014. Results were: normal Last mammogram: 02/2015. Results were: normal  Obstetric History OB History  Gravida Para Term Preterm AB Living  5 3 3   2     SAB TAB Ectopic Multiple Live Births    2          # Outcome Date GA Lbr Len/2nd Weight Sex Delivery Anes PTL Lv  5 Term 1996     Vag-Spont     4 Term 761994     Vag-Spont     3 Term 861986     Vag-Spont     2 TAB           1 TAB                The following portions of the patient's history were reviewed and updated as appropriate: allergies, current medications, past family history, past medical history, past social history, past surgical history and problem list.  Review of Systems Pertinent items are noted in HPI.    Objective:    BP (!) 144/83   Pulse 74   Ht 5\' 8"  (1.727 m)   Wt 244 lb 12.8 oz (111 kg)   LMP 03/30/2016   BMI 37.22 kg/m  General appearance: alert, appears stated age, no distress and moderately obese Lungs: clear to auscultation bilaterally Heart: regular rate and rhythm, S1, S2 normal, no murmur, click, rub or gallop Abdomen: soft, non-tender; bowel sounds normal; no masses,  no organomegaly Pelvic: cervix normal in appearance, no adnexal masses or tenderness, no cervical motion tenderness, positive findings: Nabothian cyst(s), uterus normal  size, shape, and consistency and vagina normal without discharge    Skin: Erythema & edema noted of bilateral vulva into groin. Few small ulcerative lesions.   Assessment:    Healthy female exam.   Pap smear done today Mammogram scheduled   Plan:  1. Encounter for annual routine gynecological examination  - Cytology - PAP - MM Digital Screening; Future  2. Herpes simplex vulvovaginitis  - valACYclovir (VALTREX) 1000 MG tablet; Take 1 tablet (1,000 mg total) by mouth daily.  Dispense: 5 tablet; Refill: 0  3. Vulvovaginitis  - nystatin-triamcinolone ointment (MYCOLOG); Apply 1 application topically 2 (two) times daily.  Dispense: 30 g; Refill: 0   Mammogram ordered. Follow up in: 1 year. Take valtrex x 5 days, if symptoms don't improve use mycolog cream. If symptoms worsen or don't improve, return for evaluation.

## 2016-04-08 NOTE — Progress Notes (Signed)
C/o yellowish vaginal discharge. Did not take blood pressure medication today.

## 2016-04-08 NOTE — Patient Instructions (Signed)
Knee Roll    Lying on back, with knees bent and feet flat on floor, arms outstretched to sides, slowly roll both knees to side, hold 5 seconds. Back to starting position, hold 5 seconds. Then to opposite side, hold 5 seconds. Return to starting position. Keep shoulders and arms in contact with floor.   Copyright  VHI. All rights reserved.  Bracing With Bridging (Hook-Lying)    With neutral spine, tighten pelvic floor and abdominals and hold. Lift bottom. Repeat __5_ times. Do __2_ times a day.   Copyright  VHI. All rights reserved.   Strengthening: Hip Abductor - Resisted    With band looped around both legs above knees, push right leg out to side keeping left leg still.  Repeat with other leg. Repeat __10__ times per set. Do __1__ sets per session. Do __2__ sessions per day.  http://orth.exer.us/689   Copyright  VHI. All rights reserved.    Knee to Chest (Flexion)    Pull knee toward chest. Feel stretch in lower back or buttock area. Breathing deeply, Hold __30__ seconds. Repeat with other knee. Repeat _3___ times. Do _2___ sessions per day.  http://gt2.exer.us/226   Copyright  VHI. All rights reserved.   PELVIC STABILIZATION: Pelvic Tilt (Lying)    Exhaling, pull belly toward spine, tilting pelvis forward. Hold for 5 seconds. Repeat _10__ times. Do _2__ times per day.  Copyright  VHI. All rights reserved.     Sit to Stand / Stand to Sit / Transfers    Sit on edge of a solid chair with arms, feet flat on floor. Lean forward over feet and stand up with hands on chair arms. Sit down slowly with hands on chair arms. Repeat _5___ times per session. Do __2__ sessions per day.  Copyright  VHI. All rights reserved.    Standing Marching   Using a chair if necessary, march in place. Repeat ____ times. Do ____ sessions per day.  http://gt2.exer.us/344   Copyright  VHI. All rights reserved.   Hip Backward Kick   Using a chair for balance, keep  legs shoulder width apart and toes pointed for- ward. Slowly extend one leg back, keeping knee straight. Do not lean forward. Repeat with other leg. Repeat ____ times. Do ____ sessions per day.  http://gt2.exer.us/340   Copyright  VHI. All rights reserved.   Hip Side Kick   Holding a chair for balance, keep legs shoulder width apart and toes pointed forward. Swing a leg out to side, keeping knee straight. Do not lean. Repeat using other leg. Repeat ____ times. Do ____ sessions per day.  http://gt2.exer.us/342   Copyright  VHI. All rights reserved.

## 2016-04-10 ENCOUNTER — Other Ambulatory Visit: Payer: Self-pay | Admitting: Student

## 2016-04-10 DIAGNOSIS — B373 Candidiasis of vulva and vagina: Secondary | ICD-10-CM

## 2016-04-10 DIAGNOSIS — B3731 Acute candidiasis of vulva and vagina: Secondary | ICD-10-CM

## 2016-04-10 LAB — CYTOLOGY - PAP
BACTERIAL VAGINITIS: NEGATIVE
Candida vaginitis: POSITIVE — AB
Chlamydia: NEGATIVE
DIAGNOSIS: NEGATIVE
HPV (WINDOPATH): DETECTED — AB
Neisseria Gonorrhea: NEGATIVE
Trichomonas: NEGATIVE

## 2016-04-10 MED ORDER — FLUCONAZOLE 150 MG PO TABS: 150.0000 mg | ORAL_TABLET | Freq: Every day | ORAL | 1 refills | Status: DC

## 2016-04-14 ENCOUNTER — Telehealth: Payer: Self-pay | Admitting: *Deleted

## 2016-04-14 NOTE — Telephone Encounter (Signed)
Per message from Judeth HornErin Lawrence, NP - wants us to call patient and tell her pap shows negative for dysplacia, but + for hrhpv and recommends pap in one year. Also shows yeast and she sent rx to her pharmacy.   I called Burna MortimerWanda and notified her of result/ reccommendation. And she states she had went to pharmacy and they said doctor had to authorize the mycolog ointment. I informed her I would call pharmacy.  I called pharmacy and they said her insurance doesn't cover it and is over $100 - should order alternate.  Will send message to [facility].

## 2016-04-21 ENCOUNTER — Telehealth: Payer: Self-pay | Admitting: *Deleted

## 2016-04-21 NOTE — Telephone Encounter (Signed)
Per message from Judeth HornErin Lawrence, NP since patient insurance not covering mycolog for yeast -reccommends monistat otc 3 or 7 day regimen. I called Burna MortimerWanda and notified her of Erin's reccomendation .She voices understanding.

## 2016-04-22 ENCOUNTER — Ambulatory Visit: Payer: Medicaid Other | Admitting: Physical Therapy

## 2016-04-29 ENCOUNTER — Ambulatory Visit: Payer: Medicaid Other

## 2016-04-29 ENCOUNTER — Ambulatory Visit: Payer: Medicaid Other | Admitting: Physical Therapy

## 2016-05-06 ENCOUNTER — Ambulatory Visit: Payer: Medicaid Other | Admitting: Physical Therapy

## 2016-05-08 ENCOUNTER — Ambulatory Visit: Payer: Medicaid Other | Attending: Internal Medicine | Admitting: Physical Therapy

## 2016-05-13 ENCOUNTER — Encounter: Payer: Self-pay | Admitting: Physical Therapy

## 2016-05-13 ENCOUNTER — Ambulatory Visit: Payer: Medicaid Other | Admitting: Physical Therapy

## 2016-05-13 ENCOUNTER — Ambulatory Visit: Payer: Medicaid Other | Attending: Internal Medicine | Admitting: Physical Therapy

## 2016-05-13 VITALS — BP 140/88 | HR 75

## 2016-05-13 DIAGNOSIS — G8191 Hemiplegia, unspecified affecting right dominant side: Secondary | ICD-10-CM | POA: Insufficient documentation

## 2016-05-13 DIAGNOSIS — M6281 Muscle weakness (generalized): Secondary | ICD-10-CM

## 2016-05-13 DIAGNOSIS — R2689 Other abnormalities of gait and mobility: Secondary | ICD-10-CM | POA: Insufficient documentation

## 2016-05-13 DIAGNOSIS — R2681 Unsteadiness on feet: Secondary | ICD-10-CM | POA: Diagnosis present

## 2016-05-13 NOTE — Therapy (Signed)
[facility] [address] [place], [place], [address] Phone: [phone]   Fax:  [phone]  Physical Therapy Treatment  Patient Details  Name: Michelle Brooks MRN: [id number] Date of Birth: Aug 13, 1965 Referring Provider: Fleet Contras, MD  Encounter Date: 05/13/2016      PT End of Session - 05/13/16 0810    Visit Number 3   Number of Visits 4   Date for PT Re-Evaluation 05/06/16   Authorization Type Medicaid-3 visits   Authorization Time Period 04/04/16-05/15/16   Authorization - Visit Number 2   Authorization - Number of Visits 3   PT Start Time 0805   PT Stop Time 0925   PT Time Calculation (min) 80 min   Activity Tolerance Patient tolerated treatment well   Behavior During Therapy [facility] for tasks assessed/performed      Past Medical History:  Diagnosis Date  . Chronic back pain   . Diabetes mellitus type 2 in obese (HCC)   . Diabetic retinopathy (HCC)   . Headache   . 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  [name] HSV-2 infection   . Hyperlipidemia LDL goal < 100 04/01/2011  . Hypertension     Past Surgical History:  Procedure Laterality Date  . BREAST LUMPECTOMY  2002   states benign lymph node removed.    Vitals:   05/13/16 0809 05/13/16 0828 05/13/16 0833 05/13/16 0918  BP: (!) 142/93 (!) 141/100 (!) 148/92 140/88  Pulse: 72 75          Subjective Assessment - 05/13/16 0809    Subjective No new complaints. Having some low back pain today. No falls to report.    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   Patient Stated Goals "to get me back on my feet so I can walk without this" (pointing to RW)   Currently in Pain? Yes   Pain Score 5    Pain Location Back   Pain Orientation Lower   Pain Descriptors / Indicators Aching;Constant;Spasm   Pain Type Chronic pain   Pain Onset More than a month ago   Pain Frequency Intermittent   Aggravating Factors  rain/weather changes   Pain Relieving Factors heat, meds             [facility] Adult PT Treatment/Exercise - 05/13/16 6962      Transfers   Transfers Sit to Stand;Stand to Sit   Sit to Stand 6: Modified independent (Device/Increase time);With upper extremity assist;From bed;From chair/3-in-1   Stand to Sit 6: Modified independent (Device/Increase time);With upper extremity assist;To bed;To chair/3-in-1   Number of Reps Other reps (comment);1 set   Comments sit<>stands from low mat table with minimal UE use, increased time needed. Pt performed 5 reps.      Ambulation/Gait   Ambulation/Gait Yes   Ambulation/Gait Assistance 4: Min guard;4: Min  assist   Ambulation/Gait Assistance Details cues on posture, step length, weight shifting and cane placement with gait   Ambulation Distance (Feet) 50 Feet  x1, 75 x1, 68 x1   Assistive device Small based quad cane   Gait Pattern Step-through pattern;Step-to pattern;Decreased stance time - right;Decreased step length - left;Decreased weight shift to right;Ataxic;Trunk flexed   Ambulation Surface Level;Indoor     Neuro Re-ed    Neuro Re-ed Details  tall kneeling on mat with UE support on red pball: mini squats x 10 reps, moving right leg backwards/forwards x 10 reps. quadruped over red pball: alternating leg outs x 10 reps each side with up to min  assist for balance, cues on ex form/technique. standing with single UE support: alternating fwd toe taps to foam bubbles x 10 reps each leg, alternating cross toe taps to foam bubbles x 10 reps each side, min guard assist with cues on posture and weight shifting with single leg stance activities.              Exercises   Other Exercises  on mat in hook lying position: posterior pelvic tilt 5 sec hold x 10 reps, bridge 5 sec holds x 5 reps, single knee to chest stretch 30 sec's x 1 rep each side, lower trunk rotation left<>right with 5 sed holds x 5 each side.            PT Short Term Goals - 12/19/15 1243      PT SHORT TERM GOAL #1   Title STG's = LTG's           PT Long Term Goals - 05/13/16 0830      PT LONG TERM GOAL #1   Title Pt will demonstrate independence with HEP to maximize functional gains made in PT.  (Target: 05/06/16)   Baseline 05/13/16: pt able to demo advanced HEP from last session today without cues needed   Status New     PT LONG TERM GOAL #2   Title improve gait velocity to > 0.75 ft/sec with RW for improved mobility (05/06/16)   Baseline RW: 0.49 ft/sec   Status New     PT LONG TERM GOAL #3   Title improve timed up and go to < 43 sec with SBQC for improved mobility with household distances (05/06/16)   Baseline SBQC: 53.4 sec   Status New     PT LONG TERM GOAL #4   Title amb > 150' with SBQC and supervision on level indoor/paved outdoor surfaces for improved community access (05/06/16)   Baseline amb 45' with SBQC and minguard A   Status New     PT LONG TERM GOAL #5   Title pt will report back pain < 5/10 with ambulation with LRAD for improved mobilty and decreased pain (05/06/16)   Baseline pain 7/10 at rest   Status New     PT LONG TERM GOAL #6   Title n/a   Baseline n/a          Plan - 05/13/16 0810    Clinical Impression Statement Today's extended session focused on strengthening, balance and gait with small based quad cane. Pt was able to  demo HEP with minor corrections/cues for ex form. Pt continues to fatigue with activity, needing short rest breaks. PTA recommended pt begin a walking program at home to work on activity tolerance/endurance due to pt. fatiging quickly. Pt/family member verbalized understanding. Pt is making progress toward goals and should benefit from continued PT  to progress toward unmet goals.                                             Clinical Impairments Affecting Rehab Potential financial/insurance limitations; chronicity of impairments   PT Treatment/Interventions ADLs/Self Care Home Management;DME Instruction;Gait training;Stair training;Functional mobility training;Therapeutic activities;Patient/family education;Orthotic Fit/Training;Therapeutic exercise;Balance training;Neuromuscular re-education;Passive range of motion;Manual techniques;Visual/perceptual remediation/compensation;Vestibular   PT Next Visit Plan gait with SBQC, add to HEP PRN. assess goals due to last visit authorized by insurance    Consulted and Agree with Plan of Care Patient      Patient will benefit from skilled therapeutic intervention in order to improve the following deficits and impairments:  Abnormal gait, Decreased balance, Decreased coordination, Decreased knowledge of use of DME, Decreased strength, Impaired perceived functional ability, Impaired flexibility, Decreased mobility, Impaired vision/preception, Decreased activity tolerance  Visit Diagnosis: Hemiplegia affecting right dominant side, unspecified etiology, unspecified hemiplegia type (HCC)  Other abnormalities of gait and mobility  Unsteadiness on feet  Muscle weakness (generalized)     Problem List Patient Active Problem List   Diagnosis Date Noted  . Right hemiparesis (HCC) 10/05/2014  . Diabetic neuropathy (HCC) 09/15/2014  . GERD (gastroesophageal reflux disease) 09/15/2014  . Stroke (HCC) 08/18/2014  . CVA (cerebral infarction) 08/18/2014  . Hot  flashes 12/03/2012  . High risk sexual behavior 05/13/2012  . Chronic low back pain 12/29/2011  . Obesity 10/23/2011  . Financial difficulties 10/23/2011  . Poor dentition 10/23/2011  . Paresthesia of left arm and leg 10/23/2011  . Irregular periods/menstrual cycles 09/12/2011  . Preventative health care 05/13/2011  . Dyslipidemia 04/01/2011  . Hypertension 03/27/2011  . History of stroke 03/13/2010  . Diabetes mellitus type 2, uncontrolled, with complications (HCC) 02/11/2004    Sallyanne Kuster, PTA, [facility] [address] [place], [place] [address] [phone] 05/13/16, 7:54 PM   Name: Michelle Brooks MRN: [id number] Date of Birth: 1965-03-02

## 2016-06-06 ENCOUNTER — Encounter: Payer: Self-pay | Admitting: Physical Therapy

## 2016-06-06 ENCOUNTER — Ambulatory Visit: Payer: Medicaid Other | Admitting: Physical Therapy

## 2016-06-06 VITALS — BP 127/91

## 2016-06-06 DIAGNOSIS — G8191 Hemiplegia, unspecified affecting right dominant side: Secondary | ICD-10-CM

## 2016-06-06 DIAGNOSIS — R2689 Other abnormalities of gait and mobility: Secondary | ICD-10-CM

## 2016-06-06 DIAGNOSIS — M6281 Muscle weakness (generalized): Secondary | ICD-10-CM

## 2016-06-06 DIAGNOSIS — R2681 Unsteadiness on feet: Secondary | ICD-10-CM

## 2016-06-06 NOTE — Therapy (Signed)
[facility] [address] [place], [place], [address] Phone: [phone]   Fax:  [phone]  Physical Therapy Treatment  Patient Details  Name: Michelle Brooks MRN: [id number] Date of Birth: 1965-06-07 Referring Provider: Nolene Ebbs, MD  Encounter Date: 06/06/2016      PT End of Session - 06/06/16 0814    Visit Number 4   Number of Visits 4   Date for PT Re-Evaluation 05/06/16   Authorization Type Medicaid-3 visits   Authorization Time Period 04/04/16-05/15/16, extended to 06/14/16   Authorization - Visit Number 3   Authorization - Number of Visits 3   PT Start Time 0803   PT Stop Time 0912   PT Time Calculation (min) 69 min   Activity Tolerance Patient tolerated treatment well   Behavior During Therapy [facility] for tasks assessed/performed      Past Medical History:  Diagnosis Date  . Chronic back pain   . Diabetes mellitus type 2 in obese ([place])   . Diabetic retinopathy ([place])   . Headache   . History of stroke 03/2010   Right MCA stroke in 03/2010, with MRI also noting subacute strokes in left fronto parietal area - occured in [place] received care at 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  [name] HSV-2 infection   . Hyperlipidemia LDL goal < 100 04/01/2011  . Hypertension     Past Surgical History:  Procedure Laterality Date  . BREAST LUMPECTOMY  2002   states benign lymph node removed.    Vitals:   06/06/16 0808  BP: (!) 127/91        Subjective Assessment - 06/06/16 0808    Subjective No new complaints. Having some low back pain today. No falls to report. Did not take her BP medication today because she has not eaten yet, "too early".    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   Patient Stated Goals "to get me back on my feet so I can walk without this" (pointing to RW)   Currently in Pain? Yes   Pain Score 5    Pain Location Back   Pain Orientation Lower   Pain Descriptors / Indicators Aching;Constant;Spasm   Pain Type Chronic pain   Pain Onset More than a month ago   Pain Frequency Intermittent   Aggravating Factors  rain/weather changes   Pain Relieving Factors heat, medis             [facility] Adult PT Treatment/Exercise - 06/06/16 0815      Transfers   Transfers Sit to Stand;Stand to Sit   Sit to Stand 6: Modified independent (Device/Increase time);With upper extremity assist;From bed;From chair/3-in-1   Stand to Sit 6: Modified independent (Device/Increase time);With upper extremity assist;To bed;To chair/3-in-1     Ambulation/Gait   Ambulation/Gait Yes   Ambulation/Gait Assistance 4: Min guard;5: Supervision   Ambulation/Gait Assistance Details cues on posture. pt prefers to hold cane on weak side vs opposite with no issues noted. No reaching out to other objects for  stability today. min guard to supervision only.   Ambulation Distance (Feet) 65 Feet  x 2, 70 x2 SBQC; 150 x 2 with RW   Assistive device Small based quad cane   Gait Pattern Step-through pattern;Step-to pattern;Decreased stance time - right;Decreased step length - left;Decreased weight shift to right;Ataxic;Trunk flexed   Ambulation Surface Level;Indoor   Gait velocity 1.06 ft/sec with RW      Timed Up and Go Test   TUG Normal TUG   Normal TUG (seconds) 35.28  with [facility]     Exercises   Other Exercises  reviewed HEP issued this episode of care. Pt performing all except sit<>stands and standing marching. Pt  performed in session with no issues reported. Reinforced importance of continued exercise, both HEP and a walking program, to maintain the gains made with attending therapy. Pt verbalized understanding.                            PT Education - 06/06/16 0913    Education provided Yes   Education Details importance of continued exercises and walking program after therapy ends today   Person(s) Educated Patient;Spouse   Methods Explanation;Demonstration   Comprehension Verbalized understanding;Returned demonstration          PT Short Term Goals - 12/19/15 1243      PT SHORT TERM GOAL #1   Title STG's = LTG's           PT Long Term Goals - 06/06/16 0815      PT LONG TERM GOAL #1   Title Pt will demonstrate independence with HEP to maximize functional gains made in PT.  (Target: 05/06/16)   Baseline 06/06/16: comprehensive review of current HEP performed, Had pt perform sit/stands and standing marching as she reports not doing those at home   Status Achieved     PT LONG TERM GOAL #2   Title improve gait velocity to > 0.75 ft/sec with RW for improved mobility (05/06/16)   Baseline 06/06/16: 31.03 sec's= 1.06 ft/sec with RW   Status Achieved     PT LONG TERM GOAL #3   Title improve timed up and go to < 43 sec with SBQC for improved mobility with household distances (05/06/16)   Baseline 06/06/16: 35.28 sec's with SBQC   Status Achieved     PT LONG TERM GOAL #4   Title amb > 150' with SBQC and supervision on level indoor/paved outdoor surfaces for improved community access (05/06/16)   Baseline 06/06/16: has increased distance to 70 feet on indoor level before needing rest break due to back pain   Status Partially Met     PT LONG TERM GOAL #5   Title pt will report back pain < 5/10 with ambulation with LRAD for improved mobilty and decreased pain (05/06/16)   Baseline 06/06/16: 5/10 at rest, up to 7/10 with gait. at eval was 7/10 at rest. Pain has improved, just not to goal  level.    Status Partially Met     PT LONG TERM GOAL #6   Title n/a   Baseline n/a            Plan - 06/06/16 0815    Clinical Impression Statement Pt demo'd increased stability and activity tolerance with use of small based quad cane today. She was able to go increased distances both individually and overall. Pt has met 3/5 STGs and partially met the remaining 2 STGs.    Clinical  Impairments Affecting Rehab Potential financial/insurance limitations; chronicity of impairments   PT Treatment/Interventions ADLs/Self Care Home Management;DME Instruction;Gait training;Stair training;Functional mobility training;Therapeutic activities;Patient/family education;Orthotic Fit/Training;Therapeutic exercise;Balance training;Neuromuscular re-education;Passive range of motion;Manual techniques;Visual/perceptual remediation/compensation;Vestibular   PT Next Visit Plan discharge today.   Consulted and Agree with Plan of Care Patient      Patient will benefit from skilled therapeutic intervention in order to improve the following deficits and impairments:  Abnormal gait, Decreased balance, Decreased coordination, Decreased knowledge of use of DME, Decreased strength, Impaired perceived functional ability, Impaired flexibility, Decreased mobility, Impaired vision/preception, Decreased activity tolerance  Visit Diagnosis: Hemiplegia affecting right dominant side, unspecified etiology, unspecified hemiplegia type ([place])  Other abnormalities of gait and mobility  Unsteadiness on feet  Muscle weakness (generalized)     Problem List Patient Active Problem List   Diagnosis Date Noted  . Right hemiparesis ([place]) 10/05/2014  . Diabetic neuropathy ([place]) 09/15/2014  . GERD (gastroesophageal reflux disease) 09/15/2014  . Stroke ([place]) 08/18/2014  . CVA (cerebral infarction) 08/18/2014  . Hot flashes 12/03/2012  . High risk sexual behavior 05/13/2012  . Chronic low back pain 12/29/2011  . Obesity  10/23/2011  . Financial difficulties 10/23/2011  . Poor dentition 10/23/2011  . Paresthesia of left arm and leg 10/23/2011  . Irregular periods/menstrual cycles 09/12/2011  . Preventative health care 05/13/2011  . Dyslipidemia 04/01/2011  . Hypertension 03/27/2011  . History of stroke 03/13/2010  . Diabetes mellitus type 2, uncontrolled, with complications ([place]) 78/24/2353    Willow Ora, PTA, [facility] [address], [place], [place] [address] [phone] 06/06/16, 9:23 AM   Name: Michelle Brooks MRN: [id number] Date of Birth: Sep 27, 1965

## 2016-06-09 ENCOUNTER — Encounter: Payer: Self-pay | Admitting: Physical Therapy

## 2016-06-09 DIAGNOSIS — G8191 Hemiplegia, unspecified affecting right dominant side: Secondary | ICD-10-CM

## 2016-06-09 DIAGNOSIS — R2681 Unsteadiness on feet: Secondary | ICD-10-CM

## 2016-06-09 DIAGNOSIS — M6281 Muscle weakness (generalized): Secondary | ICD-10-CM

## 2016-06-09 DIAGNOSIS — R2689 Other abnormalities of gait and mobility: Secondary | ICD-10-CM

## 2016-06-09 DIAGNOSIS — I69351 Hemiplegia and hemiparesis following cerebral infarction affecting right dominant side: Secondary | ICD-10-CM

## 2016-06-09 NOTE — Therapy (Signed)
[facility] [address] [place], [place], [address] Phone: [phone]   Fax:  [phone]  Patient Details  Name: Michelle Brooks MRN: [id number] Date of Birth: [birth year] Referring Provider:  No ref. provider found  Encounter Date: 06/09/2016  PHYSICAL THERAPY DISCHARGE SUMMARY  Visits from Start of Care: 4  Current functional level related to goals / functional outcomes:     PT Long Term Goals - 06/06/16 0815      PT LONG TERM GOAL #1   Title Pt will demonstrate independence with HEP to maximize functional gains made in PT.  (Target: 05/06/16)   Baseline 06/06/16: comprehensive review of current HEP performed, Had pt perform sit/stands and standing marching as she reports not doing those at home   Status Achieved     PT LONG TERM GOAL #2   Title improve gait velocity to > 0.75 ft/sec with RW for improved mobility (05/06/16)   Baseline 06/06/16: 31.03 sec's= 1.06 ft/sec with RW   Status Achieved     PT LONG TERM GOAL #3   Title improve timed up and go to < 43 sec with SBQC for improved mobility with household distances (05/06/16)   Baseline 06/06/16: 35.28 sec's with SBQC   Status Achieved     PT LONG TERM GOAL #4   Title amb > 150' with SBQC and supervision on level indoor/paved outdoor surfaces for improved community access (05/06/16)   Baseline 06/06/16: has increased distance to 70 feet on indoor level before needing rest break due to back pain   Status Partially Met     PT LONG TERM GOAL #5   Title pt will report back pain < 5/10 with ambulation with LRAD for improved mobilty and decreased pain (05/06/16)   Baseline 06/06/16: 5/10 at rest, up to 7/10 with gait. at eval was 7/10 at rest. Pain has improved, just not to goal level.    Status Partially Met     PT LONG TERM GOAL #6   Title n/a   Baseline n/a        Remaining deficits: Pt continues to have decreased functional mobility due to CVA and LBP.  Unable  to proceed as self pay and and all covered visits have been used.  Pt has extensive HEP.   Education / Equipment: HEP  Plan: Patient agrees to discharge.  Patient goals were partially met. Patient is being discharged due to financial reasons.  ?????    Laureen Abrahams, PT, DPT 06/09/16 8:01 AM   [facility] [address] [place], [place], [address] Phone: [phone]   Fax:  [phone]

## 2016-06-09 NOTE — Therapy (Deleted)
  [facility] [place], [place] [address]  [phone] (office) [phone] (fax)  Patient Details  Name: Michelle Brooks MRN: [id number] Date of Birth: 1965-10-29 Referring Provider:  No ref. provider found  Encounter Date: 06/09/2016   PHYSICAL THERAPY DISCHARGE SUMMARY  Visits from Start of Care: 4  Current functional level related to goals / functional outcomes:     PT Long Term Goals - 06/06/16 0815      PT LONG TERM GOAL #1   Title Pt will demonstrate independence with HEP to maximize functional gains made in PT.  (Target: 05/06/16)   Baseline 06/06/16: comprehensive review of current HEP performed, Had pt perform sit/stands and standing marching as she reports not doing those at home   Status Achieved     PT LONG TERM GOAL #2   Title improve gait velocity to > 0.75 ft/sec with RW for improved mobility (05/06/16)   Baseline 06/06/16: 31.03 sec's= 1.06 ft/sec with RW   Status Achieved     PT LONG TERM GOAL #3   Title improve timed up and go to < 43 sec with SBQC for improved mobility with household distances (05/06/16)   Baseline 06/06/16: 35.28 sec's with SBQC   Status Achieved     PT LONG TERM GOAL #4   Title amb > 150' with SBQC and supervision on level indoor/paved outdoor surfaces for improved community access (05/06/16)   Baseline 06/06/16: has increased distance to 70 feet on indoor level before needing rest break due to back pain   Status Partially Met     PT LONG TERM GOAL #5   Title pt will report back pain < 5/10 with ambulation with LRAD for improved mobilty and decreased pain (05/06/16)   Baseline 06/06/16: 5/10 at rest, up to 7/10 with gait. at eval was 7/10 at rest. Pain has improved, just not to goal level.    Status Partially Met     PT LONG TERM GOAL #6   Title n/a   Baseline n/a        Remaining deficits: See above; pt continues to have decreased functional mobility due to CVA and back pain.  Improved with covered visits  by insurance, but unable to continue due to no additional visits covered and pt unable to continue as self-pay.   Education / Equipment: HEP  Plan: Patient agrees to discharge.  Patient goals were partially met. Patient is being discharged due to financial reasons.  ?????       Laureen Abrahams, PT, DPT 06/09/16 7:40 AM        [facility] [address] [place],  [address]  [phone] (office) [phone] (fax)

## 2016-06-10 ENCOUNTER — Ambulatory Visit: Payer: Medicaid Other

## 2017-01-26 ENCOUNTER — Other Ambulatory Visit: Payer: Self-pay | Admitting: Student

## 2017-02-08 IMAGING — CT CT ANGIO HEAD
1 of 12 series · 1 of 33 positions shown · IV contrast (Iohexol (Omnipaque 350))
Comparison: MRI and MRA head 08/18/2014

CLINICAL DATA: Stroke. Nausea vomiting. Right facial numbness.
Diabetes and hypertension and hyperlipidemia

EXAM:
CT ANGIOGRAPHY HEAD AND NECK
TECHNIQUE: Multidetector CT imaging of the head and neck was performed using
the standard protocol during bolus administration of intravenous
contrast. Multiplanar CT image reconstructions and MIPs were
obtained to evaluate the vascular anatomy. Carotid stenosis
measurements (when applicable) are obtained utilizing NASCET
criteria, using the distal internal carotid diameter as the
denominator.
CONTRAST:  50mL OMNIPAQUE IOHEXOL 350 MG/ML SOLN

[Series 300: locator · axial · 0.49mm/px · 1 of 1 slices shown]
[im 1/1  soft-tissue]
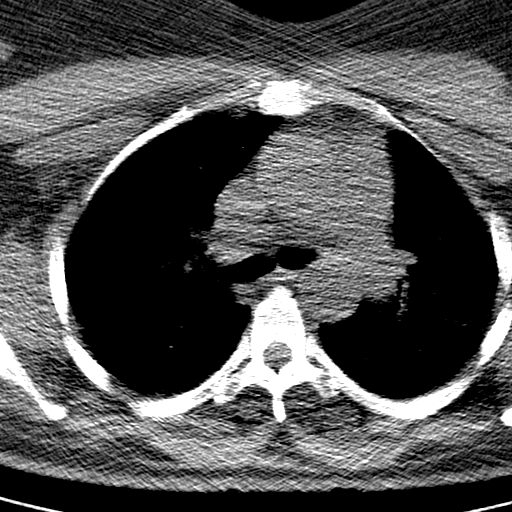

[1 of 33 positions shown; findings below may reference images not displayed]

FINDINGS: CT HEAD

Brain: MRI revealed small acute infarct in the right posterior
lateral medulla, not visualized on CT. No acute infarct identified
on CT. Ventricle size normal. Mild hyperdensity in the frontal
periventricular white matter bilaterally likely due to mild
calcification. No chronic infarct. Negative for hemorrhage or mass.

Calvarium and skull base: Negative

Paranasal sinuses: Negative

Orbits: Negative

CTA NECK

Aortic arch: Normal aortic arch. Left carotid artery origin from the
innominate artery is a normal variant. Proximal great vessels widely
patent.

Extensive reticular of density in the lung apices bilaterally
compatible with pulmonary fibrosis.

Right carotid system: Right common carotid artery normal. Right
carotid bifurcation widely patent. Negative for stenosis or
dissection. No significant atherosclerotic disease.

Left carotid system: Left common carotid artery widely patent. Left
carotid bifurcation normal. No dissection or stenosis.

Vertebral arteries:Left vertebral artery dominant. Both vertebral
arteries are patent without significant stenosis. Right vertebral
artery ends in PICA.

Skeleton: Mild cervical spine degenerative change. No acute bony
abnormality.

Other neck: Left thyroid 8 mm calcification near the left isthmus.
Enlargement of the left lobe of the thyroid with a possible 19 mm
nodule on the left. Thyroid ultrasound recommended for further
evaluation.

CTA HEAD

Anterior circulation: Mild atherosclerotic disease in the right
cavernous carotid with mild stenosis. Right anterior cerebral artery
widely patent. Right M1 segment occluded proximally. Right MCA
branches are patent with flow through collaterals.

No significant left carotid stenosis through the cavernous segment.
Hypoplastic left A1 segment which is patent. Left middle cerebral
artery widely patent.

Posterior circulation: Right vertebral artery ends in PICA. Left
vertebral artery contributes to the basilar. PICA patent
bilaterally. Basilar patent. Superior cerebellar and posterior
cerebral arteries patent. Hypoplastic left P1 segment. Fetal origin
of the left posterior cerebral artery.

Venous sinuses: Patent

Anatomic variants: Negative for cerebral aneurysm

Delayed phase: Normal enhancement on delayed imaging
IMPRESSION: Carotid and vertebral arteries are patent in the neck without
significant stenosis

Occluded right M1 segment as noted on recent MRA.

Right vertebral artery ends in PICA which is a normal variant. Note
is made of acute infarct in the right medulla in the right and PICA
territory.

Thyroid nodule.  Recommend thyroid ultrasound

## 2017-02-24 ENCOUNTER — Other Ambulatory Visit: Payer: Self-pay | Admitting: Internal Medicine

## 2017-02-24 DIAGNOSIS — E2839 Other primary ovarian failure: Secondary | ICD-10-CM

## 2017-03-11 ENCOUNTER — Inpatient Hospital Stay
Admission: RE | Admit: 2017-03-11 | Discharge: 2017-03-11 | Disposition: A | Discharge status: Home / Self Care | Payer: Self-pay | Source: Ambulatory Visit | Attending: Internal Medicine | Admitting: Internal Medicine

## 2017-03-23 ENCOUNTER — Other Ambulatory Visit: Payer: Self-pay | Admitting: Student

## 2017-03-23 ENCOUNTER — Ambulatory Visit (INDEPENDENT_AMBULATORY_CARE_PROVIDER_SITE_OTHER): Payer: Medicare Other | Admitting: Neurology

## 2017-03-23 ENCOUNTER — Encounter: Payer: Self-pay | Admitting: Neurology

## 2017-03-23 VITALS — BP 170/100 | HR 65 | Wt 253.8 lb

## 2017-03-23 DIAGNOSIS — M542 Cervicalgia: Secondary | ICD-10-CM | POA: Diagnosis not present

## 2017-03-23 DIAGNOSIS — M4804 Spinal stenosis, thoracic region: Secondary | ICD-10-CM

## 2017-03-23 DIAGNOSIS — A6004 Herpesviral vulvovaginitis: Secondary | ICD-10-CM

## 2017-03-23 DIAGNOSIS — M5440 Lumbago with sciatica, unspecified side: Secondary | ICD-10-CM

## 2017-03-23 MED ORDER — TOPIRAMATE 50 MG PO TABS: 50.0000 mg | ORAL_TABLET | Freq: Two times a day (BID) | ORAL | 3 refills | Status: DC

## 2017-03-23 NOTE — Progress Notes (Signed)
[facility] [address] [place]. [place] [address] [phone]       OFFICE CONSULT NOTE  [name] Date of Birth:  [birth year] Medical Record Number:  [id number]   Referring MD:  Michelle Brooks  Reason for Referral: Neck and back pain HPI: Ms Michelle Brooks is a 52 year african american lady who states she is mostly here to see me today for back and neck pain and spasms. She states she's had it for couple of years. She states this back happened any time but is mostly brought on by physical activity. She describes pain in the back of her head and neck as well as back the pain occasionally shoots up to the bottom of the neck and also down her lower back. She at times describes muscle spasms in the back which prevent her from continuing activity and she has to rest and feels better in a few minutes. She denies specifically shooting pain down her arm or her legs but this does happen in her back. She denies any recent fall, neck or back injury. She denies any prior history of degenerative spine disease. She denies any trouble with walking balance or weakness in the legs. She has no trouble controlling her bowel or bladder. She does have a remote history of right brain stem stroke in 2016 when she was seen by Dr. Erlinda Brooks. She did have initial gait and balance difficulties but required prolonged rehabilitation and recovered to initially walk with a walker and now with a cane. She was on Plavix for about a year and then switched herself to aspirin 81 mg. She's had no recurrent stroke or TIA symptoms. She states she is tolerating aspirin well without bleeding or bruising. She states her blood pressure is usually well controlled and today it is elevated in office at 170/100. She remains on Pravachol which is tolerating well without muscle aches and pain. She states she's been eating healthy and has lost some weight. She complains of minor headaches off-and-on occurring once a week or so. These  are sharp involving heart cortex and temples but lasted less than 5 minutes. She takes Advil which seems to work quite well. She has been talking about her neck and back pain to with her primary physician for a while but informed me that no x-rays or MRI scans have been done. She does have occasional paresthesia in the feet from diabetic neuropathy but that this seems to have responded well to gabapentin 300 mg 3 times daily which she takes. Review of medical records accompanying her sugar LDL cholesterol is elevated at 126 mg percent on 09/03/16 ROS:   14 system review of systems is positive for  back pain, neck pain, insomnia, feeling hot, feeling cold back and neck spasms and all other systems negative  PMH:  Past Medical History:  Diagnosis Date  . Chronic back pain   . Diabetes mellitus type 2 in obese ([place])   . Diabetic retinopathy ([place])   . GERD (gastroesophageal reflux disease)   . Headache   . History of stroke 03/2010   Right MCA stroke in 03/2010, with MRI also noting subacute strokes in left fronto parietal area - occured in [place] received care at [facility] thought due to uncontrolled blood pressure // No residual deficits // TTE (03/2010) at [facility] - normal LV systolic function, moderate pericardial effusion with 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  [name] HSV-2 infection   .  Hyperlipidemia LDL goal < 100 04/01/2011  . Hypertension   . Lumbago    pain in the back  . Stroke [facility])     Social History:  Social History   Socioeconomic History  . Marital status: Divorced    Spouse name: Not on file  . Number of children: 3  . Years of education: 12th grade  . Highest education level: Not on file  Social Needs  . Financial resource strain: Not on file  . Food insecurity - worry: Not on file  . Food insecurity - inability: Not on file  . Transportation needs - medical: Not on file  . Transportation needs - non-medical:  Not on file  Occupational History  . Occupation: unemployed    Comment: previously worked in [place] before her stroke  Tobacco Use  . Smoking status: Former Smoker    Packs/day: 0.20    Years: 7.00    Pack years: 1.40    Types: Cigarettes    Last attempt to quit: 03/26/2004    Years since quitting: 13.0  . Smokeless tobacco: Never Used  Substance and Sexual Activity  . Alcohol use: No  . Drug use: No  . Sexual activity: Yes    Partners: Male    Birth control/protection: Condom  Other Topics Concern  . Not on file  Social History Narrative   Recently moved from [place] in 10/2010 due to wanting a change from stressors.   Previously followed by Dr. Wynelle Brooks at [facility]          Medications:   Current Outpatient Medications on File Prior to Visit  Medication Sig Dispense Refill  . acyclovir (ZOVIRAX) 400 MG tablet Take 1 tablet (400 mg total) by mouth 3 (three) times daily. (Patient taking differently: Take 400 mg by mouth as needed. ) 21 tablet 1  . aspirin 81 MG tablet Take 81 mg by mouth daily.    . Blood Glucose Monitoring Suppl (ACCU-CHEK AVIVA PLUS) W/DEVICE KIT 1 kit by Does not apply route 3 (three) times daily before meals. 1 kit 0  . Blood Glucose Monitoring Suppl (TRUE METRIX METER) DEVI 1 each by Does not apply route 4 (four) times daily -  before meals and at bedtime. 1 Device 0  . fluconazole (DIFLUCAN) 150 MG tablet Take 1 tablet (150 mg total) by mouth daily. 1 tablet 1  . gabapentin (NEURONTIN) 300 MG capsule Take 300 mg by mouth 3 (three) times daily.    [name] glucose blood (TRUE METRIX BLOOD GLUCOSE TEST) test strip Use as instructed 100 each 5  . ibuprofen (ADVIL,MOTRIN) 400 MG tablet Take by mouth.    . insulin lispro protamine-lispro (HUMALOG 50/50) (50-50) 100 UNIT/ML SUSP injection Inject 80 Units into the skin 3 (three) times daily. Up to 60 depending on cbg    . JARDIANCE 10 MG TABS tablet     . lisinopril-hydrochlorothiazide (PRINZIDE,ZESTORETIC) 20-12.5 MG per  tablet Take 1 tablet by mouth daily.    . metFORMIN (GLUCOPHAGE) 500 MG tablet Take 500 mg by mouth 2 (two) times daily with a meal.    . nystatin-triamcinolone ointment (MYCOLOG) Apply 1 application topically 2 (two) times daily. 30 g 0  . pravastatin (PRAVACHOL) 40 MG tablet Take 1 tablet (40 mg total) by mouth daily. 30 tablet 5  . TRUEPLUS LANCETS 28G MISC 1 each by Does not apply route 4 (four) times daily -  before meals and at bedtime. 100 each 5  . venlafaxine (EFFEXOR) 37.5 MG tablet Take  37.5 mg by mouth daily.     No current facility-administered medications on file prior to visit.     Allergies:   Allergies  Allergen Reactions  . Latex     Like a burning in area    Physical Exam General: obese middle aged african [place] lady not in distress..   Neck: supple with no carotid or supraclavicular bruits Cardiovascular: regular rate and rhythm, no murmurs Musculoskeletal: no deformity but mild spasm of paraspinal muscles in the lumbar regions as well as neck. Straight leg raising limited bilaterally symmetrically due to back pain and spasm Skin:  no rash/petichiae Vascular:  Normal pulses all extremities  Neurologic Exam Mental Status: Awake and fully alert. Oriented to place and time. Recent and remote memory intact. Attention span, concentration and fund of knowledge appropriate. Mood and affect appropriate.  Cranial Nerves: Fundoscopic exam reveals sharp disc margins. Pupils equal, briskly reactive to light. Extraocular movements full without nystagmus. Visual fields full to confrontation. Hearing intact. Facial sensation intact. Face, tongue, palate moves normally and symmetrically.  Motor: Normal bulk and tone. Normal strength in all tested extremity muscles. Sensory.: intact to touch , pinprick , position and vibratory sensation.  Coordination: Rapid alternating movements normal in all extremities. Finger-to-nose and heel-to-shin performed accurately bilaterally. Gait and  Station: Arises from chair without difficulty. Stance is normal. Gait demonstrates normal stride length and balance . Able to heel, toe and tandem walk without difficulty.  Reflexes: 1+ and symmetric. Toes downgoing.      ASSESSMENT: 52 year old obese African American lady with intermittent back and neck pain mostly of musculoskeletal etiology with some neuralgic features. Unremarkable exam with no focal deficits. 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 I also advised her to do regular neck and back stretching exercises. Check MRI scan of cervical, thoracic and lumbar spines. Continue aspirin for stroke prevention and maintain strict control of hypertension with blood pressure goal below 130/90, lipids with LDL cholesterol goal below 70 mg percent and diabetes with him about an A1c goal below 6.5%. She was advised to use a cane at all times. Grater than 50% time during this 45 minute consultation visit was spent on counseling and coordination of care about her back and neck pain and answering questions She'll return for follow-up in 2 months or call earlier if necessary Antony Contras, MD  [facility] [address] [place], [place] [address]  Phone [phone] Fax [phone] Note: This document was prepared with digital dictation and possible smart phrase technology. Any transcriptional errors that result from this process are unintentional.

## 2017-03-23 NOTE — Patient Instructions (Signed)
I had a long discussion with the patient regarding her back and neck pain and recommend a trial of Topamax 50 mg daily for a week to increase to twice daily if tolerated without side effects. I also advised her to do regular neck and back stretching exercises. Check MRI scan of cervical, thoracic and lumbar spines. Continue aspirin for stroke prevention and maintain strict control of hypertension with blood pressure goal below 130/90, lipids with LDL cholesterol goal below 70 mg percent and diabetes with him about an A1c goal below 6.5%. She was advised to use a cane at all times. She'll return for follow-up in 2 months or call earlier if necessary   Back Exercises If you have pain in your back, do these exercises 2-3 times each day or as told by your doctor. When the pain goes away, do the exercises once each day, but repeat the steps more times for each exercise (do more repetitions). If you do not have pain in your back, do these exercises once each day or as told by your doctor. Exercises Single Knee to Chest  Do these steps 3-5 times in a row for each leg: 1. Lie on your back on a firm bed or the floor with your legs stretched out. 2. Bring one knee to your chest. 3. Hold your knee to your chest by grabbing your knee or thigh. 4. Pull on your knee until you feel a gentle stretch in your lower back. 5. Keep doing the stretch for 10-30 seconds. 6. Slowly let go of your leg and straighten it.  Pelvic Tilt  Do these steps 5-10 times in a row: 1. Lie on your back on a firm bed or the floor with your legs stretched out. 2. Bend your knees so they point up to the ceiling. Your feet should be flat on the floor. 3. Tighten your lower belly (abdomen) muscles to press your lower back against the floor. This will make your tailbone point up to the ceiling instead of pointing down to your feet or the floor. 4. Stay in this position for 5-10 seconds while you gently tighten your muscles and breathe  evenly.  Cat-Cow  Do these steps until your lower back bends more easily: 1. Get on your hands and knees on a firm surface. Keep your hands under your shoulders, and keep your knees under your hips. You may put padding under your knees. 2. Let your head hang down, and make your tailbone point down to the floor so your lower back is round like the back of a cat. 3. Stay in this position for 5 seconds. 4. Slowly lift your head and make your tailbone point up to the ceiling so your back hangs low (sags) like the back of a cow. 5. Stay in this position for 5 seconds.  Press-Ups  Do these steps 5-10 times in a row: 1. Lie on your belly (face-down) on the floor. 2. Place your hands near your head, about shoulder-width apart. 3. While you keep your back relaxed and keep your hips on the floor, slowly straighten your arms to raise the top half of your body and lift your shoulders. Do not use your back muscles. To make yourself more comfortable, you may change where you place your hands. 4. Stay in this position for 5 seconds. 5. Slowly return to lying flat on the floor.  Bridges  Do these steps 10 times in a row: 1. Lie on your back on a firm surface.  2. Bend your knees so they point up to the ceiling. Your feet should be flat on the floor. 3. Tighten your butt muscles and lift your butt off of the floor until your waist is almost as high as your knees. If you do not feel the muscles working in your butt and the back of your thighs, slide your feet 1-2 inches farther away from your butt. 4. Stay in this position for 3-5 seconds. 5. Slowly lower your butt to the floor, and let your butt muscles relax.  If this exercise is too easy, try doing it with your arms crossed over your chest. Belly Crunches  Do these steps 5-10 times in a row: 1. Lie on your back on a firm bed or the floor with your legs stretched out. 2. Bend your knees so they point up to the ceiling. Your feet should be flat on  the floor. 3. Cross your arms over your chest. 4. Tip your chin a little bit toward your chest but do not bend your neck. 5. Tighten your belly muscles and slowly raise your chest just enough to lift your shoulder blades a tiny bit off of the floor. 6. Slowly lower your chest and your head to the floor.  Back Lifts Do these steps 5-10 times in a row: 1. Lie on your belly (face-down) with your arms at your sides, and rest your forehead on the floor. 2. Tighten the muscles in your legs and your butt. 3. Slowly lift your chest off of the floor while you keep your hips on the floor. Keep the back of your head in line with the curve in your back. Look at the floor while you do this. 4. Stay in this position for 3-5 seconds. 5. Slowly lower your chest and your face to the floor.  Contact a doctor if:  Your back pain gets a lot worse when you do an exercise.  Your back pain does not lessen 2 hours after you exercise. If you have any of these problems, stop doing the exercises. Do not do them again unless your doctor says it is okay. Get help right away if:  You have sudden, very bad back pain. If this happens, stop doing the exercises. Do not do them again unless your doctor says it is okay. This information is not intended to replace advice given to you by your health care provider. Make sure you discuss any questions you have with your health care provider. Document Released: 03/01/2010 Document Revised: 07/05/2015 Document Reviewed: 03/23/2014 Elsevier Interactive Patient Education  2018 [employer]  Neck Exercises Neck exercises can be important for many reasons:  They can help you to improve and maintain flexibility in your neck. This can be especially important as you age.  They can help to make your neck stronger. This can make movement easier.  They can reduce or prevent neck pain.  They may help your upper back.  Ask your health care provider which neck exercises would be  best for you. Exercises Neck Press Repeat this exercise 10 times. Do it first thing in the morning and right before bed or as told by your health care provider. 1. Lie on your back on a firm bed or on the floor with a pillow under your head. 2. Use your neck muscles to push your head down on the pillow and straighten your spine. 3. Hold the position as well as you can. Keep your head facing up and your chin tucked.  4. Slowly count to 5 while holding this position. 5. Relax for a few seconds. Then repeat.  Isometric Strengthening Do a full set of these exercises 2 times a day or as told by your health care provider. 1. Sit in a supportive chair and place your hand on your forehead. 2. Push forward with your head and neck while pushing back with your hand. Hold for 10 seconds. 3. Relax. Then repeat the exercise 3 times. 4. Next, do thesequence again, this time putting your hand against the back of your head. Use your head and neck to push backward against the hand pressure. 5. Finally, do the same exercise on either side of your head, pushing sideways against the pressure of your hand.  Prone Head Lifts Repeat this exercise 5 times. Do this 2 times a day or as told by your health care provider. 1. Lie face-down, resting on your elbows so that your chest and upper back are raised. 2. Start with your head facing downward, near your chest. Position your chin either on or near your chest. 3. Slowly lift your head upward. Lift until you are looking straight ahead. Then continue lifting your head as far back as you can stretch. 4. Hold your head up for 5 seconds. Then slowly lower it to your starting position.  Supine Head Lifts Repeat this exercise 8-10 times. Do this 2 times a day or as told by your health care provider. 1. Lie on your back, bending your knees to point to the ceiling and keeping your feet flat on the floor. 2. Lift your head slowly off the floor, raising your chin toward your  chest. 3. Hold for 5 seconds. 4. Relax and repeat.  Scapular Retraction Repeat this exercise 5 times. Do this 2 times a day or as told by your health care provider. 1. Stand with your arms at your sides. Look straight ahead. 2. Slowly pull both shoulders backward and downward until you feel a stretch between your shoulder blades in your upper back. 3. Hold for 10-30 seconds. 4. Relax and repeat.  Contact a health care provider if:  Your neck pain or discomfort gets much worse when you do an exercise.  Your neck pain or discomfort does not improve within 2 hours after you exercise. If you have any of these problems, stop exercising right away. Do not do the exercises again unless your health care provider says that you can. Get help right away if:  You develop sudden, severe neck pain. If this happens, stop exercising right away. Do not do the exercises again unless your health care provider says that you can. Exercises Neck Stretch  Repeat this exercise 3-5 times. 1. Do this exercise while standing or while sitting in a chair. 2. Place your feet flat on the floor, shoulder-width apart. 3. Slowly turn your head to the right. Turn it all the way to the right so you can look over your right shoulder. Do not tilt or tip your head. 4. Hold this position for 10-30 seconds. 5. Slowly turn your head to the left, to look over your left shoulder. 6. Hold this position for 10-30 seconds.  Neck Retraction Repeat this exercise 8-10 times. Do this 3-4 times a day or as told by your health care provider. 1. Do this exercise while standing or while sitting in a sturdy chair. 2. Look straight ahead. Do not bend your neck. 3. Use your fingers to push your chin backward. Do not bend your neck  for this movement. Continue to face straight ahead. If you are doing the exercise properly, you will feel a slight sensation in your throat and a stretch at the back of your neck. 4. Hold the stretch for 1-2  seconds. Relax and repeat.  This information is not intended to replace advice given to you by your health care provider. Make sure you discuss any questions you have with your health care provider. Document Released: 01/08/2015 Document Revised: 07/05/2015 Document Reviewed: 08/07/2014 Elsevier Interactive Patient Education  [employer].

## 2017-03-26 ENCOUNTER — Telehealth: Payer: Self-pay

## 2017-03-26 NOTE — Telephone Encounter (Signed)
Notes from [facility] for [facility] Pearlean BrownieSethi to review.

## 2017-03-26 NOTE — Telephone Encounter (Signed)
Patient was seen by Dr.Sethi for complaints of head, back, leg pain.Pt had stroke in the past. [phone]. Dr.Sethi wants office notes. PT sign release form on 03/23/2017. Release form fax to [facility] at [phone].

## 2017-03-30 ENCOUNTER — Inpatient Hospital Stay
Admission: RE | Admit: 2017-03-30 | Discharge: 2017-03-30 | Disposition: A | Discharge status: Home / Self Care | Payer: Self-pay | Source: Ambulatory Visit | Attending: Internal Medicine | Admitting: Internal Medicine

## 2017-04-06 ENCOUNTER — Ambulatory Visit
Admission: RE | Admit: 2017-04-06 | Discharge: 2017-04-06 | Disposition: A | Discharge status: Home / Self Care | Payer: Medicare Other | Source: Ambulatory Visit | Attending: Neurology | Admitting: Neurology

## 2017-04-06 DIAGNOSIS — M542 Cervicalgia: Secondary | ICD-10-CM

## 2017-04-06 DIAGNOSIS — M5440 Lumbago with sciatica, unspecified side: Secondary | ICD-10-CM | POA: Diagnosis not present

## 2017-04-06 DIAGNOSIS — M4804 Spinal stenosis, thoracic region: Secondary | ICD-10-CM | POA: Diagnosis not present

## 2017-04-07 ENCOUNTER — Other Ambulatory Visit: Payer: Self-pay | Admitting: Internal Medicine

## 2017-04-07 DIAGNOSIS — Z1231 Encounter for screening mammogram for malignant neoplasm of breast: Secondary | ICD-10-CM

## 2017-04-15 ENCOUNTER — Other Ambulatory Visit: Payer: Self-pay | Admitting: Internal Medicine

## 2017-04-15 DIAGNOSIS — L299 Pruritus, unspecified: Secondary | ICD-10-CM

## 2017-05-01 ENCOUNTER — Other Ambulatory Visit: Payer: Self-pay | Admitting: Internal Medicine

## 2017-05-01 ENCOUNTER — Ambulatory Visit
Admission: RE | Admit: 2017-05-01 | Discharge: 2017-05-01 | Disposition: A | Discharge status: Home / Self Care | Payer: Medicare Other | Source: Ambulatory Visit | Attending: Internal Medicine | Admitting: Internal Medicine

## 2017-05-01 ENCOUNTER — Ambulatory Visit: Payer: Medicare Other

## 2017-05-01 DIAGNOSIS — L299 Pruritus, unspecified: Secondary | ICD-10-CM

## 2017-05-01 DIAGNOSIS — E2839 Other primary ovarian failure: Secondary | ICD-10-CM

## 2017-06-01 ENCOUNTER — Ambulatory Visit: Payer: Medicare Other | Admitting: Neurology

## 2017-11-02 ENCOUNTER — Inpatient Hospital Stay: Admission: RE | Admit: 2017-11-02 | Payer: Medicare Other | Source: Ambulatory Visit

## 2018-08-27 ENCOUNTER — Inpatient Hospital Stay (HOSPITAL_COMMUNITY): Payer: Medicare Other

## 2018-08-27 ENCOUNTER — Emergency Department (HOSPITAL_COMMUNITY): Payer: Medicare Other

## 2018-08-27 ENCOUNTER — Other Ambulatory Visit: Payer: Self-pay

## 2018-08-27 ENCOUNTER — Encounter (HOSPITAL_COMMUNITY): Payer: Self-pay | Admitting: Emergency Medicine

## 2018-08-27 ENCOUNTER — Inpatient Hospital Stay (HOSPITAL_COMMUNITY)
Admission: EM | Admit: 2018-08-27 | Discharge: 2018-08-31 | DRG: 042 | Disposition: A | Discharge status: Home / Self Care | Payer: Medicare Other | Attending: Internal Medicine | Admitting: Internal Medicine

## 2018-08-27 DIAGNOSIS — Z7902 Long term (current) use of antithrombotics/antiplatelets: Secondary | ICD-10-CM

## 2018-08-27 DIAGNOSIS — K219 Gastro-esophageal reflux disease without esophagitis: Secondary | ICD-10-CM | POA: Diagnosis present

## 2018-08-27 DIAGNOSIS — Z833 Family history of diabetes mellitus: Secondary | ICD-10-CM | POA: Diagnosis not present

## 2018-08-27 DIAGNOSIS — R1110 Vomiting, unspecified: Secondary | ICD-10-CM | POA: Diagnosis not present

## 2018-08-27 DIAGNOSIS — E669 Obesity, unspecified: Secondary | ICD-10-CM | POA: Diagnosis present

## 2018-08-27 DIAGNOSIS — E785 Hyperlipidemia, unspecified: Secondary | ICD-10-CM | POA: Diagnosis present

## 2018-08-27 DIAGNOSIS — E1169 Type 2 diabetes mellitus with other specified complication: Secondary | ICD-10-CM | POA: Diagnosis present

## 2018-08-27 DIAGNOSIS — Z7982 Long term (current) use of aspirin: Secondary | ICD-10-CM

## 2018-08-27 DIAGNOSIS — Z79899 Other long term (current) drug therapy: Secondary | ICD-10-CM | POA: Diagnosis not present

## 2018-08-27 DIAGNOSIS — E11319 Type 2 diabetes mellitus with unspecified diabetic retinopathy without macular edema: Secondary | ICD-10-CM | POA: Diagnosis present

## 2018-08-27 DIAGNOSIS — E1165 Type 2 diabetes mellitus with hyperglycemia: Secondary | ICD-10-CM | POA: Diagnosis present

## 2018-08-27 DIAGNOSIS — I10 Essential (primary) hypertension: Secondary | ICD-10-CM | POA: Diagnosis present

## 2018-08-27 DIAGNOSIS — Z823 Family history of stroke: Secondary | ICD-10-CM | POA: Diagnosis not present

## 2018-08-27 DIAGNOSIS — I6501 Occlusion and stenosis of right vertebral artery: Secondary | ICD-10-CM | POA: Diagnosis present

## 2018-08-27 DIAGNOSIS — I6340 Cerebral infarction due to embolism of unspecified cerebral artery: Secondary | ICD-10-CM | POA: Diagnosis present

## 2018-08-27 DIAGNOSIS — Z1159 Encounter for screening for other viral diseases: Secondary | ICD-10-CM

## 2018-08-27 DIAGNOSIS — M545 Low back pain: Secondary | ICD-10-CM | POA: Diagnosis present

## 2018-08-27 DIAGNOSIS — Z87891 Personal history of nicotine dependence: Secondary | ICD-10-CM | POA: Diagnosis not present

## 2018-08-27 DIAGNOSIS — E860 Dehydration: Secondary | ICD-10-CM | POA: Diagnosis not present

## 2018-08-27 DIAGNOSIS — R29700 NIHSS score 0: Secondary | ICD-10-CM | POA: Diagnosis present

## 2018-08-27 DIAGNOSIS — Z8673 Personal history of transient ischemic attack (TIA), and cerebral infarction without residual deficits: Secondary | ICD-10-CM

## 2018-08-27 DIAGNOSIS — I639 Cerebral infarction, unspecified: Secondary | ICD-10-CM

## 2018-08-27 DIAGNOSIS — G8929 Other chronic pain: Secondary | ICD-10-CM | POA: Diagnosis present

## 2018-08-27 DIAGNOSIS — I6300 Cerebral infarction due to thrombosis of unspecified precerebral artery: Secondary | ICD-10-CM | POA: Diagnosis not present

## 2018-08-27 DIAGNOSIS — I82409 Acute embolism and thrombosis of unspecified deep veins of unspecified lower extremity: Secondary | ICD-10-CM | POA: Diagnosis not present

## 2018-08-27 DIAGNOSIS — I340 Nonrheumatic mitral (valve) insufficiency: Secondary | ICD-10-CM | POA: Diagnosis not present

## 2018-08-27 DIAGNOSIS — I6389 Other cerebral infarction: Secondary | ICD-10-CM | POA: Diagnosis not present

## 2018-08-27 LAB — BASIC METABOLIC PANEL
Anion gap: 12 (ref 5–15)
BUN: 16 mg/dL (ref 6–20)
CO2: 24 mmol/L (ref 22–32)
Calcium: 10 mg/dL (ref 8.9–10.3)
Chloride: 100 mmol/L (ref 98–111)
Creatinine, Ser: 1.29 mg/dL — ABNORMAL HIGH (ref 0.44–1.00)
GFR calc Af Amer: 55 mL/min — ABNORMAL LOW (ref >60)
GFR calc non Af Amer: 47 mL/min — ABNORMAL LOW (ref >60)
Glucose, Bld: 328 mg/dL — ABNORMAL HIGH (ref 70–99)
Potassium: 4.2 mmol/L (ref 3.5–5.1)
Sodium: 136 mmol/L (ref 135–145)

## 2018-08-27 LAB — URINALYSIS, ROUTINE W REFLEX MICROSCOPIC
Bacteria, UA: NONE SEEN
Bilirubin Urine: NEGATIVE
Glucose, UA: >=500 mg/dL — AB
Hgb urine dipstick: NEGATIVE
Ketones, ur: NEGATIVE mg/dL
Leukocytes,Ua: NEGATIVE
Nitrite: NEGATIVE
Protein, ur: NEGATIVE mg/dL
Specific Gravity, Urine: 1.02 (ref 1.005–1.030)
pH: 5 (ref 5.0–8.0)

## 2018-08-27 LAB — POCT I-STAT EG7
Acid-base deficit: 1 mmol/L (ref 0.0–2.0)
Bicarbonate: 25.6 mmol/L (ref 20.0–28.0)
Calcium, Ion: 1.26 mmol/L (ref 1.15–1.40)
HCT: 43 % (ref 36.0–46.0)
Hemoglobin: 14.6 g/dL (ref 12.0–15.0)
O2 Saturation: 65 %
Potassium: 4.2 mmol/L (ref 3.5–5.1)
Sodium: 138 mmol/L (ref 135–145)
TCO2: 27 mmol/L (ref 22–32)
pCO2, Ven: 47.8 mmHg (ref 44.0–60.0)
pH, Ven: 7.338 (ref 7.250–7.430)
pO2, Ven: 36 mmHg (ref 32.0–45.0)

## 2018-08-27 LAB — CBC
HCT: 42.5 % (ref 36.0–46.0)
Hemoglobin: 13.7 g/dL (ref 12.0–15.0)
MCH: 28 pg (ref 26.0–34.0)
MCHC: 32.2 g/dL (ref 30.0–36.0)
MCV: 86.9 fL (ref 80.0–100.0)
Platelets: 288 10*3/uL (ref 150–400)
RBC: 4.89 MIL/uL (ref 3.87–5.11)
RDW: 13.2 % (ref 11.5–15.5)
WBC: 9.5 10*3/uL (ref 4.0–10.5)
nRBC: 0 % (ref 0.0–0.2)

## 2018-08-27 LAB — SARS CORONAVIRUS 2 BY RT PCR (HOSPITAL ORDER, PERFORMED IN ~~LOC~~ HOSPITAL LAB): SARS Coronavirus 2: NEGATIVE

## 2018-08-27 LAB — I-STAT BETA HCG BLOOD, ED (MC, WL, AP ONLY): I-stat hCG, quantitative: <5 m[IU]/mL (ref <5)

## 2018-08-27 LAB — GLUCOSE, CAPILLARY
Glucose-Capillary: 235 mg/dL — ABNORMAL HIGH (ref 70–99)
Glucose-Capillary: 271 mg/dL — ABNORMAL HIGH (ref 70–99)

## 2018-08-27 LAB — CBG MONITORING, ED: Glucose-Capillary: 320 mg/dL — ABNORMAL HIGH (ref 70–99)

## 2018-08-27 MED ORDER — CLOPIDOGREL BISULFATE 75 MG PO TABS
75.0000 mg | ORAL_TABLET | Freq: Every day | ORAL | Status: DC
  Administered 2018-08-28 – 2018-08-31 (×4): 75 mg via ORAL
  Filled 2018-08-27 (×4): qty 1

## 2018-08-27 MED ORDER — PROMETHAZINE HCL 25 MG/ML IJ SOLN
12.5000 mg | Freq: Four times a day (QID) | INTRAMUSCULAR | Status: DC | PRN
  Administered 2018-08-27: 12.5 mg via INTRAVENOUS
  Filled 2018-08-27: qty 1

## 2018-08-27 MED ORDER — SENNOSIDES-DOCUSATE SODIUM 8.6-50 MG PO TABS: 1.0000 | ORAL_TABLET | Freq: Every evening | ORAL | Status: DC | PRN

## 2018-08-27 MED ORDER — ONDANSETRON HCL 4 MG/2ML IJ SOLN
4.0000 mg | Freq: Once | INTRAMUSCULAR | Status: AC
  Administered 2018-08-27: 4 mg via INTRAVENOUS
  Filled 2018-08-27: qty 2

## 2018-08-27 MED ORDER — PRAVASTATIN SODIUM 40 MG PO TABS
40.0000 mg | ORAL_TABLET | Freq: Every day | ORAL | Status: DC
  Administered 2018-08-28: 40 mg via ORAL
  Filled 2018-08-27: qty 1

## 2018-08-27 MED ORDER — ENOXAPARIN SODIUM 40 MG/0.4ML ~~LOC~~ SOLN
40.0000 mg | SUBCUTANEOUS | Status: DC
  Administered 2018-08-27 – 2018-08-30 (×4): 40 mg via SUBCUTANEOUS
  Filled 2018-08-27 (×4): qty 0.4

## 2018-08-27 MED ORDER — ACETAMINOPHEN 160 MG/5ML PO SOLN: 650.0000 mg | ORAL | Status: DC | PRN

## 2018-08-27 MED ORDER — INSULIN ASPART 100 UNIT/ML ~~LOC~~ SOLN
0.0000 [IU] | Freq: Three times a day (TID) | SUBCUTANEOUS | Status: DC
  Administered 2018-08-28: 11 [IU] via SUBCUTANEOUS
  Administered 2018-08-28 (×2): 8 [IU] via SUBCUTANEOUS
  Administered 2018-08-29 (×2): 5 [IU] via SUBCUTANEOUS
  Administered 2018-08-29: 2 [IU] via SUBCUTANEOUS
  Administered 2018-08-30: 3 [IU] via SUBCUTANEOUS
  Administered 2018-08-31: 2 [IU] via SUBCUTANEOUS

## 2018-08-27 MED ORDER — ASPIRIN EC 81 MG PO TBEC
81.0000 mg | DELAYED_RELEASE_TABLET | Freq: Every day | ORAL | Status: DC
  Administered 2018-08-27 – 2018-08-31 (×5): 81 mg via ORAL
  Filled 2018-08-27 (×7): qty 1

## 2018-08-27 MED ORDER — LACTATED RINGERS IV BOLUS
1000.0000 mL | Freq: Once | INTRAVENOUS | Status: AC
  Administered 2018-08-27: 1000 mL via INTRAVENOUS

## 2018-08-27 MED ORDER — LINAGLIPTIN 5 MG PO TABS
5.0000 mg | ORAL_TABLET | Freq: Every day | ORAL | Status: DC
  Administered 2018-08-28 – 2018-08-31 (×4): 5 mg via ORAL
  Filled 2018-08-27 (×4): qty 1

## 2018-08-27 MED ORDER — INSULIN ASPART PROT & ASPART (70-30 MIX) 100 UNIT/ML ~~LOC~~ SUSP
30.0000 [IU] | Freq: Two times a day (BID) | SUBCUTANEOUS | Status: DC
  Administered 2018-08-28: 30 [IU] via SUBCUTANEOUS
  Filled 2018-08-27: qty 10

## 2018-08-27 MED ORDER — TOPIRAMATE 25 MG PO TABS: 50.0000 mg | ORAL_TABLET | Freq: Two times a day (BID) | ORAL | Status: DC

## 2018-08-27 MED ORDER — LABETALOL HCL 5 MG/ML IV SOLN: 5.0000 mg | INTRAVENOUS | Status: DC | PRN

## 2018-08-27 MED ORDER — DEXTROSE-NACL 5-0.9 % IV SOLN
INTRAVENOUS | Status: DC
  Administered 2018-08-27: 21:00:00 via INTRAVENOUS

## 2018-08-27 MED ORDER — ACETAMINOPHEN 650 MG RE SUPP: 650.0000 mg | RECTAL | Status: DC | PRN

## 2018-08-27 MED ORDER — ONDANSETRON HCL 4 MG/2ML IJ SOLN
4.0000 mg | Freq: Four times a day (QID) | INTRAMUSCULAR | Status: DC | PRN
  Administered 2018-08-27: 4 mg via INTRAVENOUS
  Filled 2018-08-27: qty 2

## 2018-08-27 MED ORDER — INSULIN ASPART 100 UNIT/ML ~~LOC~~ SOLN
0.0000 [IU] | Freq: Every day | SUBCUTANEOUS | Status: DC
  Administered 2018-08-27: 2 [IU] via SUBCUTANEOUS
  Administered 2018-08-28: 3 [IU] via SUBCUTANEOUS
  Administered 2018-08-30: 4 [IU] via SUBCUTANEOUS

## 2018-08-27 MED ORDER — ONDANSETRON 4 MG PO TBDP: 4.0000 mg | ORAL_TABLET | Freq: Three times a day (TID) | ORAL | 0 refills | Status: DC | PRN

## 2018-08-27 MED ORDER — ACETAMINOPHEN 325 MG PO TABS
650.0000 mg | ORAL_TABLET | ORAL | Status: DC | PRN
  Administered 2018-08-28 – 2018-08-30 (×3): 650 mg via ORAL
  Filled 2018-08-27 (×3): qty 2

## 2018-08-27 MED ORDER — STROKE: EARLY STAGES OF RECOVERY BOOK
Freq: Once | Status: AC
  Administered 2018-08-27: 21:00:00
  Filled 2018-08-27: qty 1

## 2018-08-27 NOTE — Progress Notes (Signed)
Patient concern about why she has no order for diet. RN explained to patient that per the MD who admitted her does not want her to eat at moment due to her constant vomiting and also to prevent her from aspirating though she did pass swallow screen. Patient  verbalized understanding but called family and told them different story that the nurse is not allowing her to eat for a reason she does not know. Family(daughter) called at the front desk with an attitude about 6 times and hanging staff up.  Boyfriend also called on patient's phone cursing the RN. Patient was asked  why she did not explained to family the way the "nurse" explained to her, she apologized on behalf of her daughter and told RN she should ignore her daughter because her daughter always have an attitude.

## 2018-08-27 NOTE — ED Provider Notes (Signed)
[place] EMERGENCY DEPARTMENT Provider Note   CSN: [id number] Arrival date & time: 08/27/18  1003    History   Chief Complaint Chief Complaint  Patient presents with  . Emesis  . Dizziness  . Hyperglycemia    HPI Michelle Brooks is a 53 y.o. female.     HPI  53 year old female presents with dizziness and vomiting.  Started this morning when she woke up around 6 AM.  First noticed the dizziness like she was going to pass out but also felt like some things were spinning.  Then she has had vomiting.  EMS gave Zofran which temporarily helped but now she is nauseated and vomiting again.  No headache, blurry vision, chest pain, shortness of breath.  Chronic back pain in her low back that is present but not worse or new compared to typical.  No weakness.  Glucose was elevated today, recently has been fairly well managed per her.  Past Medical History:  Diagnosis Date  . Chronic back pain   . Diabetes mellitus type 2 in obese ([place])   . Diabetic retinopathy ([place])   . GERD (gastroesophageal reflux disease)   . Headache   . History of stroke 03/2010   Right MCA stroke in 03/2010, with MRI also noting subacute strokes in left fronto parietal area - occured in [place] received care at [facility] thought due to uncontrolled blood pressure // No residual deficits // TTE (03/2010) at [facility] - normal LV systolic function, moderate pericardial effusion with 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  [name] HSV-2 infection   . Hyperlipidemia LDL goal < 100 04/01/2011  . Hypertension   . Lumbago    pain in the back  . Stroke [facility])     Patient Active Problem List   Diagnosis Date Noted  . Right hemiparesis ([place]) 10/05/2014  . Diabetic neuropathy ([place]) 09/15/2014  . GERD (gastroesophageal reflux disease) 09/15/2014  . Stroke ([place]) 08/18/2014  . CVA (cerebral infarction) 08/18/2014  . Hot flashes 12/03/2012  . High risk  sexual behavior 05/13/2012  . Chronic low back pain 12/29/2011  . Obesity 10/23/2011  . Financial difficulties 10/23/2011  . Poor dentition 10/23/2011  . Paresthesia of left arm and leg 10/23/2011  . Irregular periods/menstrual cycles 09/12/2011  . Preventative health care 05/13/2011  . Dyslipidemia 04/01/2011  . Hypertension 03/27/2011  . History of stroke 03/13/2010  . Diabetes mellitus type 2, uncontrolled, with complications ([place]) [id number]/[id number]    Past Surgical History:  Procedure Laterality Date  . BREAST LUMPECTOMY  2002   states benign lymph node removed.     OB History    Gravida  5   Para  3   Term  3   Preterm      AB  2   Living        SAB      TAB  2   Ectopic      Multiple      Live Births               Home Medications    Prior to Admission medications   Medication Sig Start Date End Date Taking? Authorizing Provider  acyclovir (ZOVIRAX) 400 MG tablet Take 1 tablet (400 mg total) by mouth 3 (three) times daily. Patient taking differently: Take 400 mg by mouth 3 (three) times daily. Prn for flare up 07/16/15  Yes Jorje Guild, NP  aspirin 81 MG tablet  Take 81 mg by mouth daily.   Yes [provider]  clopidogrel (PLAVIX) 75 MG tablet Take 75 mg by mouth daily. 07/02/18  Yes [provider]  diclofenac sodium (VOLTAREN) 1 % GEL Apply 2 g topically 4 (four) times daily as needed for pain. 03/29/18  Yes [provider]  gabapentin (NEURONTIN) 300 MG capsule Take 300 mg by mouth 3 (three) times daily.   Yes [provider]  insulin lispro protamine-lispro (HUMALOG 50/50) (50-50) 100 UNIT/ML SUSP injection Inject 80 Units into the skin 3 (three) times daily. Up to 60 depending on cbg   Yes [provider]  JANUVIA 100 MG tablet Take 100 mg by mouth daily. 07/02/18  Yes [provider]  lisinopril-hydrochlorothiazide (PRINZIDE,ZESTORETIC) 20-12.5 MG per tablet Take 1 tablet by mouth daily.   Yes  [provider]  metFORMIN (GLUCOPHAGE) 500 MG tablet Take 500 mg by mouth 2 (two) times daily with a meal.   Yes [provider]  pravastatin (PRAVACHOL) 40 MG tablet Take 1 tablet (40 mg total) by mouth daily. 05/14/12  Yes Kalia-Reynolds, Maitri S, DO  Blood Glucose Monitoring Suppl (ACCU-CHEK AVIVA PLUS) W/DEVICE KIT 1 kit by Does not apply route 3 (three) times daily before meals. 06/24/12   Kalia-Reynolds, Maitri S, DO  Blood Glucose Monitoring Suppl (TRUE METRIX METER) DEVI 1 each by Does not apply route 4 (four) times daily -  before meals and at bedtime. 09/15/14   Charlott Rakes, MD  fluconazole (DIFLUCAN) 150 MG tablet Take 1 tablet (150 mg total) by mouth daily. Patient not taking: Reported on 08/27/2018 04/10/16   Jorje Guild, NP  glucose blood (TRUE METRIX BLOOD GLUCOSE TEST) test strip Use as instructed 09/15/14   Charlott Rakes, MD  nystatin-triamcinolone ointment (MYCOLOG) Apply 1 application topically 2 (two) times daily. Patient not taking: Reported on 08/27/2018 04/08/16   Jorje Guild, NP  ondansetron (ZOFRAN ODT) 4 MG disintegrating tablet Take 1 tablet (4 mg total) by mouth every 8 (eight) hours as needed for nausea or vomiting. 08/27/18   Sherwood Gambler, MD  topiramate (TOPAMAX) 50 MG tablet Take 1 tablet (50 mg total) by mouth 2 (two) times daily. Start 1 tablet daily x 1 week then twice daily Patient not taking: Reported on 08/27/2018 03/23/17   Garvin Fila, MD  TRUEPLUS LANCETS 28G MISC 1 each by Does not apply route 4 (four) times daily -  before meals and at bedtime. 09/18/14   Charlott Rakes, MD  valACYclovir (VALTREX) 1000 MG tablet TAKE 1 TABLET BY MOUTH ONCE DAILY Patient not taking: Reported on 08/27/2018 03/23/17   Jorje Guild, NP    Family History Family History  Problem Relation Age of Onset  . Diabetes Mother   . Stroke Mother 61  . Diabetes Brother   . Diabetes Brother   . Cancer Brother 46       unknown type  . Hypertension Brother      Social History Social History   Tobacco Use  . Smoking status: Former Smoker    Packs/day: 0.20    Years: 7.00    Pack years: 1.40    Types: Cigarettes    Quit date: 03/26/2004    Years since quitting: 14.4  . Smokeless tobacco: Never Used  Substance Use Topics  . Alcohol use: No  . Drug use: No     Allergies   Lactose intolerance (gi) and Latex   Review of Systems Review of Systems  Constitutional: Negative for fever.  Eyes: Negative for visual disturbance.  Respiratory: Negative for shortness of breath.   Gastrointestinal: Positive for nausea and vomiting. Negative for abdominal pain.  Musculoskeletal: Positive for back pain.  Neurological: Positive for dizziness and light-headedness. Negative for weakness and headaches.  All other systems reviewed and are negative.    Physical Exam Updated Vital Signs BP 140/75   Pulse 80   Temp 98.2 F (36.8 C) (Oral)   Resp (!) 23   Ht 5' 8" (1.727 m)   Wt 113.4 kg   SpO2 95%   BMI 38.01 kg/m   Physical Exam Vitals signs and nursing note reviewed.  Constitutional:      General: She is not in acute distress.    Appearance: She is well-developed. She is obese. She is not ill-appearing or diaphoretic.  HENT:     Head: Normocephalic and atraumatic.     Right Ear: External ear normal.     Left Ear: External ear normal.     Nose: Nose normal.  Eyes:     General:        Right eye: No discharge.        Left eye: No discharge.     Extraocular Movements: Extraocular movements intact.     Pupils: Pupils are equal, round, and reactive to light.  Cardiovascular:     Rate and Rhythm: Normal rate and regular rhythm.     Heart sounds: Normal heart sounds.  Pulmonary:     Effort: Pulmonary effort is normal.     Breath sounds: Normal breath sounds.  Abdominal:     Palpations: Abdomen is soft.     Tenderness: There is no abdominal tenderness.  Skin:    General: Skin is warm and dry.  Neurological:     Mental Status: She  is alert.     Comments: CN 3-12 grossly intact. 5/5 strength in all 4 extremities. Grossly normal sensation. Normal finger to nose.   Psychiatric:        Mood and Affect: Mood is not anxious.      ED Treatments / Results  Labs (all labs ordered are listed, but only abnormal results are displayed) Labs Reviewed  BASIC METABOLIC PANEL - Abnormal; Notable for the following components:      Result Value   Glucose, Bld 328 (*)    Creatinine, Ser 1.29 (*)    GFR calc non Af Amer 47 (*)    GFR calc Af Amer 55 (*)    All other components within normal limits  URINALYSIS, ROUTINE W REFLEX MICROSCOPIC - Abnormal; Notable for the following components:   Glucose, UA >=500 (*)    All other components within normal limits  CBG MONITORING, ED - Abnormal; Notable for the following components:   Glucose-Capillary 320 (*)    All other components within normal limits  CBC  I-STAT BETA HCG BLOOD, ED (MC, WL, AP ONLY)  I-STAT VENOUS BLOOD GAS, ED  POCT I-STAT EG7    EKG EKG Interpretation  Date/Time:  Friday August 27 2018 10:24:53 EDT Ventricular Rate:  81 PR Interval:    QRS Duration: 93 QT Interval:  413 QTC Calculation: 480 R Axis:   88 Text Interpretation:  Sinus rhythm no acute ST/T changes no significant change since 2016 Confirmed by Sherwood Gambler [phone]) on 08/27/2018 10:35:23 AM   Radiology No results found.  Procedures Procedures (including critical care time)  Medications Ordered in ED Medications  lactated ringers bolus 1,000 mL (0 mLs Intravenous Stopped 08/27/18 1149)  ondansetron (ZOFRAN) injection 4 mg (4 mg Intravenous Given 08/27/18 1045)  lactated ringers bolus 1,000 mL (0 mLs Intravenous Stopped 08/27/18 1404)     Initial Impression / Assessment and Plan / ED Course  I have reviewed the triage vital signs and the nursing notes.  Pertinent labs & imaging results that were available during my care of the patient were reviewed by me and considered in my medical  decision making (see chart for details).        Patient's vomiting and dizziness is much better.  She endorses no headache or other symptoms such as numbness originally but just prior to discharge she states that she has been having an occipital headache for a couple weeks and now is having intermittent numbness to her jaw which is just like when she had a stroke.  She did have an MRI confirmed stroke a couple years ago and it was cerebellar.  I think is reasonable to get an MRI given this dizziness could have been vertigo associated with stroke.  Care transferred to Dr. Stark Jock. She has already ambulated well, if negative can d/c.  Final Clinical Impressions(s) / ED Diagnoses   Final diagnoses:  Vomiting in adult  Dehydration    ED Discharge Orders         Ordered    ondansetron (ZOFRAN ODT) 4 MG disintegrating tablet  Every 8 hours PRN     08/27/18 1329           Sherwood Gambler, MD 08/27/18 [phone]

## 2018-08-27 NOTE — H&P (Signed)
[facility]                                                                                    Patient Demographics  Michelle Brooks, is a 53 y.o. female  CSN: [id number]  MRN: [id number]  DOB - [birth year]  Admit Date - 08/27/2018  Outpatient Primary MD for the patient is Nolene Ebbs, MD   With History of -  Past Medical History:  Diagnosis Date  . Chronic back pain   . Diabetes mellitus type 2 in obese ([place])   . Diabetic retinopathy ([place])   . GERD (gastroesophageal reflux disease)   . Headache   . History of stroke 03/2010   Right MCA stroke in 03/2010, with MRI also noting subacute strokes in left fronto parietal area - occured in [place] received care at [facility] thought due to uncontrolled blood pressure // No residual deficits // TTE (03/2010) at [facility] - normal LV systolic function, moderate pericardial effusion with 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  [name] HSV-2 infection   . Hyperlipidemia LDL goal < 100 04/01/2011  . Hypertension   . Lumbago    pain in the back  . Stroke [facility])       Past Surgical History:  Procedure Laterality Date  . BREAST LUMPECTOMY  2002   states benign lymph node removed.    in for   Chief Complaint  Patient presents with  . Emesis  . Dizziness  . Hyperglycemia     HPI  Michelle Brooks  is a 53 y.o. female, with past medical history significant for diabetes mellitus type 2, history of multiple CVAs with right MCA stroke in 2012 and right lateral medulla, right inferior cerebellar peduncle infarcts in 2016 on dual antiplatelet therapy with aspirin and Plavix, history of hypertension and dyslipidemia presenting with 1 day history of dizziness associated with nausea and vomiting.  No chest pain, shortness of breath, headaches reported. Work-up in the emergency room showed an acute cerebellar infarct . Neurology was consulted and we are asked to admit .  Patient was not  a candidate for TPA, outside the window.    Review of Systems    Patient denies any chest pains or palpitations. No fever chills No headaches at this time Complains of dizziness No cough or shortness of breath No diarrhea No urinary symptoms No focal weaknesses were reported , but she reports feeling unsteady All other systems are reviewed and are negative  Social History Social History   Tobacco Use  . Smoking status: Former Smoker    Packs/day: 0.20    Years: 7.00    Pack years: 1.40    Types: Cigarettes    Quit date: 03/26/2004    Years since quitting: 14.4  . Smokeless tobacco: Never Used  Substance Use Topics  . Alcohol use: No     Family History Family History  Problem Relation Age of Onset  . Diabetes Mother   . Stroke Mother 43  . Diabetes Brother   . Diabetes Brother   . Cancer Brother 23       unknown type  .  Hypertension Brother      Prior to Admission medications   Medication Sig Start Date End Date Taking? Authorizing Provider  acyclovir (ZOVIRAX) 400 MG tablet Take 1 tablet (400 mg total) by mouth 3 (three) times daily. Patient taking differently: Take 400 mg by mouth 3 (three) times daily. Prn for flare up 07/16/15  Yes Jorje Guild, NP  aspirin 81 MG tablet Take 81 mg by mouth daily.   Yes [provider]  clopidogrel (PLAVIX) 75 MG tablet Take 75 mg by mouth daily. 07/02/18  Yes [provider]  diclofenac sodium (VOLTAREN) 1 % GEL Apply 2 g topically 4 (four) times daily as needed for pain. 03/29/18  Yes [provider]  gabapentin (NEURONTIN) 300 MG capsule Take 300 mg by mouth 3 (three) times daily.   Yes [provider]  insulin lispro protamine-lispro (HUMALOG 50/50) (50-50) 100 UNIT/ML SUSP injection Inject 80 Units into the skin 3 (three) times daily. Up to 60 depending on cbg   Yes [provider]  JANUVIA 100 MG tablet Take 100 mg by mouth daily. 07/02/18  Yes [provider]   lisinopril-hydrochlorothiazide (PRINZIDE,ZESTORETIC) 20-12.5 MG per tablet Take 1 tablet by mouth daily.   Yes [provider]  metFORMIN (GLUCOPHAGE) 500 MG tablet Take 500 mg by mouth 2 (two) times daily with a meal.   Yes [provider]  pravastatin (PRAVACHOL) 40 MG tablet Take 1 tablet (40 mg total) by mouth daily. 05/14/12  Yes Kalia-Reynolds, Maitri S, DO  Blood Glucose Monitoring Suppl (ACCU-CHEK AVIVA PLUS) W/DEVICE KIT 1 kit by Does not apply route 3 (three) times daily before meals. 06/24/12   Kalia-Reynolds, Maitri S, DO  Blood Glucose Monitoring Suppl (TRUE METRIX METER) DEVI 1 each by Does not apply route 4 (four) times daily -  before meals and at bedtime. 09/15/14   Charlott Rakes, MD  fluconazole (DIFLUCAN) 150 MG tablet Take 1 tablet (150 mg total) by mouth daily. Patient not taking: Reported on 08/27/2018 04/10/16   Jorje Guild, NP  glucose blood (TRUE METRIX BLOOD GLUCOSE TEST) test strip Use as instructed 09/15/14   Charlott Rakes, MD  nystatin-triamcinolone ointment (MYCOLOG) Apply 1 application topically 2 (two) times daily. Patient not taking: Reported on 08/27/2018 04/08/16   Jorje Guild, NP  ondansetron (ZOFRAN ODT) 4 MG disintegrating tablet Take 1 tablet (4 mg total) by mouth every 8 (eight) hours as needed for nausea or vomiting. 08/27/18   Sherwood Gambler, MD  topiramate (TOPAMAX) 50 MG tablet Take 1 tablet (50 mg total) by mouth 2 (two) times daily. Start 1 tablet daily x 1 week then twice daily Patient not taking: Reported on 08/27/2018 03/23/17   Garvin Fila, MD  TRUEPLUS LANCETS 28G MISC 1 each by Does not apply route 4 (four) times daily -  before meals and at bedtime. 09/18/14   Charlott Rakes, MD  valACYclovir (VALTREX) 1000 MG tablet TAKE 1 TABLET BY MOUTH ONCE DAILY Patient not taking: Reported on 08/27/2018 03/23/17   Jorje Guild, NP    Allergies  Allergen Reactions  . Lactose Intolerance (Gi) Diarrhea and Nausea And Vomiting  . Latex      Like a burning in area    Physical Exam  Vitals  Blood pressure (!) 190/91, pulse 91, temperature 98.2 F (36.8 C), temperature source Oral, resp. rate 20, height _0  (1.727 m), weight 113.4 kg, SpO2 99 %.   General appearance; looks tired, weak but very pleasant HEENT no jaundice or pallor,  no facial deviation pupils equal reactive to light Neck supple, no neck vein distention, no bruits Heart normal S1-S2, no murmurs gallops rubs Chest clear and resonant no rhonchi noted Abdomen soft, nontender bowel sounds present, obese Extremities no clubbing cyanosis or edema noted Neurological examination, motor power seems to be intact with symmetrical           motor power on both sides   Finger-to-nose is questionable but was done Skin no rashes or ulcers   Data Review  CBC Recent Labs  Lab 08/27/18 1012 08/27/18 1035  WBC 9.5  --   HGB 13.7 14.6  HCT 42.5 43.0  PLT 288  --   MCV 86.9  --   MCH 28.0  --   MCHC 32.2  --   RDW 13.2  --    ------------------------------------------------------------------------------------------------------------------  Chemistries  Recent Labs  Lab 08/27/18 1012 08/27/18 1035  NA 136 138  K 4.2 4.2  CL 100  --   CO2 24  --   GLUCOSE 328*  --   BUN 16  --   CREATININE 1.29*  --   CALCIUM 10.0  --    ------------------------------------------------------------------------------------------------------------------ estimated creatinine clearance is 66.6 mL/min (A) (by C-G formula based on SCr of 1.29 mg/dL (H)). ------------------------------------------------------------------------------------------------------------------ No results for input(s): TSH, T4TOTAL, T3FREE, THYROIDAB in the last 72 hours.  Invalid input(s): FREET3   Coagulation profile No results for input(s): INR, PROTIME in the last 168 hours. ------------------------------------------------------------------------------------------------------------------- No  results for input(s): DDIMER in the last 72 hours. -------------------------------------------------------------------------------------------------------------------  Cardiac Enzymes No results for input(s): CKMB, TROPONINI, MYOGLOBIN in the last 168 hours.  Invalid input(s): CK ------------------------------------------------------------------------------------------------------------------ Invalid input(s): POCBNP   ---------------------------------------------------------------------------------------------------------------  Urinalysis    Component Value Date/Time   COLORURINE YELLOW 08/27/2018 1012   APPEARANCEUR CLEAR 08/27/2018 1012   LABSPEC 1.020 08/27/2018 1012   PHURINE 5.0 08/27/2018 1012   GLUCOSEU >=500 (A) 08/27/2018 1012   HGBUR NEGATIVE 08/27/2018 1012   BILIRUBINUR NEGATIVE 08/27/2018 1012   KETONESUR NEGATIVE 08/27/2018 1012   PROTEINUR NEGATIVE 08/27/2018 1012   UROBILINOGEN 0.2 08/18/2014 1235   NITRITE NEGATIVE 08/27/2018 1012   LEUKOCYTESUR NEGATIVE 08/27/2018 1012    ----------------------------------------------------------------------------------------------------------------   Imaging results:   Mr Brain Wo Contrast  Result Date: 08/27/2018 CLINICAL DATA:  Ataxia, stroke suspected.  Dizziness and vomiting. EXAM: MRI HEAD WITHOUT CONTRAST TECHNIQUE: Multiplanar, multiecho pulse sequences of the brain and surrounding structures were obtained without intravenous contrast. COMPARISON:  Head MRI 08/18/2014, head MRA 08/19/2014, and head and neck CTA 08/19/2014 FINDINGS: Brain: There is patchy restricted diffusion inferiorly and posteriorly in the right cerebellar hemisphere consistent with an acute, moderate-sized PICA territory infarct. There is subtle volume loss posterolaterally in the medulla on the right corresponding to the location of the acute infarct on the prior MRI. A small chronic cortical infarct in the right frontal lobe is unchanged. T2  hyperintensities in the cerebral white matter bilaterally are nonspecific but compatible with mild chronic small vessel ischemic disease, slightly progressed from the prior MRI. There is wallerian degeneration along the left corticospinal tract extending from the corona radiata to the cerebellar peduncle. No intracranial hemorrhage, mass, midline shift, or extra-axial fluid collection is identified. The ventricles and sulci are within normal limits for age. 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 Vascular: Absent distal right V4 segment flow void consistent with previously demonstrated occlusion. Skull and upper cervical spine: Unremarkable bone marrow signal. Sinuses/Orbits: Unremarkable orbits. Small right maxillary sinus mucous retention cyst. Clear mastoid air  cells. Other: None. IMPRESSION: 1. Moderate-sized acute right cerebellar infarct. 2. 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 Electronically Signed   By: Logan Bores M.D.   On: 08/27/2018 16:47      Assessment & Plan  Acute right cerebral CVA with history of of right distal vertebral artery occlusion and multiple infarcts in the past including lateral medulla and right inferior cerebellar peduncle Continue with aspirin Admit to neuro telemetry for observation and keep n.p.o. Transesophageal echo might be considered according to neurology note , to check for PFO  Diabetes mellitus type 2, uncontrolled Continue with Humulin/Januvia for now, hold metformin Insulin sliding scale  Hypertension Hold lisinopril/HCTZ , PRN Normodyne  Dyslipidemia Continue with Pravachol    DVT Prophylaxis Lovenox  AM Labs Ordered, also please review Full Orders  Family Communication: Called Sister Anissa, no answer  Code Status full  Disposition Plan: Home  Time spent in minutes : 39 minutes  Condition GUARDED   _0 @

## 2018-08-27 NOTE — ED Triage Notes (Signed)
Per EMS- pt here for eval of emesis and dizziness, hypertension and hypertension. States she normally has normal glucose, today it is 300s. Pt was dizzy when she woke up with emesis, and back pain.

## 2018-08-27 NOTE — ED Notes (Addendum)
Patient is requesting a head CT because the right side of her jaw "feels like its moving and similar to when I had a stroke" and the back of her head feels like blood is rushing. EDP made aware.

## 2018-08-27 NOTE — ED Notes (Signed)
Pt denied gown 

## 2018-08-27 NOTE — ED Notes (Signed)
Pt aware that urine sample is needed.  

## 2018-08-27 NOTE — ED Provider Notes (Signed)
Care assumed from Dr. Regenia Skeeter at shift change.  Patient is awaiting results of an MRI.  Patient woke this morning acutely dizzy.  She has history of prior strokes and believes she may have had another.  MRI report shows an acute cerebellar infarct.  This finding was discussed with Dr. Rory Percy from neurology.  He will evaluate the patient and is requesting admission to the hospitalist service.  I have spoken with the hospitalist who agrees to admit.   Veryl Speak, MD 08/27/18 1710

## 2018-08-27 NOTE — Progress Notes (Signed)
Pt daughter called the unit to ask about why her mother can't eat. Pt daughter had attitude and rude to staff on the phone; Network engineer reported to charge RN; charge RN Regulatory affairs officer) answered the call when pt daughter called again; pt daughter was rude and yelling at RN on the phone as she was trying to explain and address her concerns. Pt daughter kept on insisting she don't understand why her mother blood sugar is higher than it was this morning; as RN was explaining things to her; pt daughter hanged up on RN. Pt daughter called back to the unit and insisted on having her mother blood sugar checked again now and for staff to call her with the results even though pt daughter had gotten update on mother. Pt daughter was called back with results and she said "so it's rising, you guys will rather have it rise up than going down!" pt daughter was informed mother was on dextrose fluids; and to call back to speak with MD in am for update; she started yelling at RN; MD Hijazi paged and notified to call pt daughter with updates; pt daughter called back again and was informed MD has been notified to call her back with updates. Pt RN aware of pt's daughter behavior/incident.

## 2018-08-27 NOTE — ED Notes (Signed)
Updated pt's daughter Olive Bass) on pt's stauts and plan of care at pt's request [phone]

## 2018-08-27 NOTE — Consult Note (Signed)
Neurology Consultation  Reason for Consult: Dizziness Referring Physician: Dr. Veryl Speak  CC: Dizziness  History is obtained from: Patient, chart  HPI: Michelle Brooks is a 53 y.o. female who was past medical history of right MCA stroke in 2012 with nearly resolved symptoms with no residual deficit, reports found in care everywhere, and reports having had at least 2 or 3 more strokes, another stroke involving the right lateral medulla and right inferior cerebral peduncle in the region of the right PICA territory in 2016 again with no residual deficits, diabetes, hypertension, hyperlipidemia, headaches, chronic back pain, who had a sudden onset of dizziness and multiple episodes of vomiting this morning. She reports that she went to bed last known normal at 9 PM, and woke up this morning feeling unsteady and unstable as if she has been skating for a while and things around her are moving without her moving with them making her feel dizzy.  She also vomited multiple times. She denied any focal tingling numbness or weakness but said that usually when she walks with a walker, it is not too much of a problem for her but today walking with a walker was very bothersome and she felt as if she is going to fall if she continued to try to walk. Her examination in the ED was unremarkable but there was a suspicion for small vessel stroke given her history of strokes and MRI was obtained that showed a scattered right cerebellar infarct and absent distal right V4 segment flow void consistent with previously demonstrated occlusion. She denied having been feeling sick prior to this.  Denies prior headaches but said this morning she had a headache on the right side of her head. Denies shortness of breath, chest pain, fevers chills.  Denies abdominal pain.  Endorses nausea and vomiting as above. Denies any bleeding clotting issues.  For outpatient care, she was sent to [facility] neurology-I do not have access to any  charts from an outpatient neurology care after her discharge from the hospital in 2016-last progress note by Dr. Erlinda Hong.  LKW: 9 PM on 08/26/2018 tpa given?: no, outside the window Premorbid modified Rankin scale (mRS): 3-uses a walker to walk due to chronic back pain and has been using it for a while.  ROS:  Unable to obtain due to altered mental status.   Past Medical History:  Diagnosis Date  . Chronic back pain   . Diabetes mellitus type 2 in obese ([place])   . Diabetic retinopathy ([place])   . GERD (gastroesophageal reflux disease)   . Headache   . History of stroke 03/2010   Right MCA stroke in 03/2010, with MRI also noting subacute strokes in left fronto parietal area - occured in [place] received care at [facility] thought due to uncontrolled blood pressure // No residual deficits // TTE (03/2010) at [facility] - normal LV systolic function, moderate pericardial effusion with 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  [name] HSV-2 infection   . Hyperlipidemia LDL goal < 100 04/01/2011  . Hypertension   . Lumbago    pain in the back  . Stroke [facility])     Family History  Problem Relation Age of Onset  . Diabetes Mother   . Stroke Mother 53  . Diabetes Brother   . Diabetes Brother   . Cancer Brother 22       unknown type  . Hypertension Brother    Social History:  reports that she quit smoking about 14 years ago. Her smoking use included cigarettes. She has a 1.40 pack-year smoking history. She has never used smokeless tobacco. She reports that she does not drink alcohol or use drugs.  Medications No current facility-administered medications for this encounter.   Current Outpatient Medications:  .  acyclovir (ZOVIRAX) 400 MG tablet, Take 1 tablet (400 mg total) by mouth 3 (three) times daily. (Patient taking differently: Take 400 mg by mouth 3 (three) times daily. Prn for flare up), Disp: 21 tablet, Rfl: 1 .  aspirin 81 MG tablet, Take 81  mg by mouth daily., Disp: , Rfl:  .  clopidogrel (PLAVIX) 75 MG tablet, Take 75 mg by mouth daily., Disp: , Rfl:  .  diclofenac sodium (VOLTAREN) 1 % GEL, Apply 2 g topically 4 (four) times daily as needed for pain., Disp: , Rfl:  .  gabapentin (NEURONTIN) 300 MG capsule, Take 300 mg by mouth 3 (three) times daily., Disp: , Rfl:  .  insulin lispro protamine-lispro (HUMALOG 50/50) (50-50) 100 UNIT/ML SUSP injection, Inject 80 Units into the skin 3 (three) times daily. Up to 60 depending on cbg, Disp: , Rfl:  .  JANUVIA 100 MG tablet, Take 100 mg by mouth daily., Disp: , Rfl:  .  lisinopril-hydrochlorothiazide (PRINZIDE,ZESTORETIC) 20-12.5 MG per tablet, Take 1 tablet by mouth daily., Disp: , Rfl:  .  metFORMIN (GLUCOPHAGE) 500 MG tablet, Take 500 mg by mouth 2 (two) times daily with a meal., Disp: , Rfl:  .  pravastatin (PRAVACHOL) 40 MG tablet, Take 1 tablet (40 mg total) by mouth daily., Disp: 30 tablet, Rfl: 5 .  Blood Glucose Monitoring Suppl (ACCU-CHEK AVIVA PLUS) W/DEVICE KIT, 1 kit by Does not apply route 3 (three) times daily before meals., Disp: 1 kit, Rfl: 0 .  Blood Glucose Monitoring Suppl (TRUE METRIX METER) DEVI, 1 each by Does not apply route 4 (four) times daily -  before meals and at bedtime., Disp: 1 Device, Rfl: 0 .  fluconazole (DIFLUCAN) 150 MG tablet, Take 1 tablet (150 mg total) by mouth daily. (Patient not taking: Reported on 08/27/2018), Disp: 1 tablet, Rfl: 1 .  glucose blood (TRUE METRIX BLOOD GLUCOSE TEST) test strip, Use as instructed, Disp: 100 each, Rfl: 5 .  nystatin-triamcinolone ointment (MYCOLOG), Apply 1 application topically 2 (two) times daily. (Patient not taking: Reported on 08/27/2018), Disp: 30 g, Rfl: 0 .  ondansetron (ZOFRAN ODT) 4 MG disintegrating tablet, Take 1 tablet (4 mg total) by mouth every 8 (eight) hours as needed for nausea or vomiting., Disp: 10 tablet, Rfl: 0 .  topiramate (TOPAMAX) 50 MG tablet, Take 1 tablet (50 mg total) by mouth 2 (two) times  daily. Start 1 tablet daily x 1 week then twice daily (Patient not taking: Reported on 08/27/2018), Disp: 60 tablet, Rfl: 3 .  TRUEPLUS LANCETS 28G MISC, 1 each by Does not apply route 4 (four) times daily -  before meals and at bedtime., Disp: 100 each, Rfl: 5 .  valACYclovir (VALTREX) 1000 MG tablet, TAKE 1 TABLET BY MOUTH ONCE DAILY (Patient not taking: Reported on 08/27/2018), Disp: 5 tablet, Rfl: 0  Exam: Current vital signs: BP (!) 190/91   Pulse 91   Temp 98.2 F (36.8 C) (Oral)   Resp 20   Ht '5\' 8"'  (1.727 m)   Wt 113.4 kg   SpO2 99%   BMI 38.01 kg/m  Vital signs in last 24 hours: Temp:  [98.2 F (36.8 C)] 98.2 F (  36.8 C) (07/17 1004) Pulse Rate:  [70-96] 91 (07/17 1703) Resp:  [14-25] 20 (07/17 1703) BP: (121-190)/(66-116) 190/91 (07/17 1703) SpO2:  [94 %-100 %] 99 % (07/17 1703) Weight:  [113.4 kg] 113.4 kg (07/17 1048) General: Awake alert in no distress HEENT: Normocephalic atraumatic dry oral mucous membranes Lungs: Clear to auscultation Cardiovascular: Regular rate rhythm Abdomen: Obese nontender Extremities: Warm well perfused Neurological exam Awake alert oriented x3 Speech is clear Naming repetition comprehension intact Fluency intact Cranial nerves: Pupils equal round react light, extraocular movements show slightly broken smooth pursuit but otherwise unremarkable, no nystagmus, visual fields are full, facial sensation is intact, face symmetric, auditory acuity intact, palate midline, shoulder shrug intact, tongue midline. Motor exam: No vertical drift in all 4 extremities although her lower extremities examination is complicated by a lot of back pain but she is able to lift them against gravity on coaching for 5 seconds. Sensory exam: Intact light touch all over with no extinction Coordination: Finger-nose-finger appears intact.  Due to back pain, cannot perform heel-knee-shin test and I cannot really properly assess for dysmetria with the effort she gives  me. Gait testing was deferred at this time. NIH stroke scale-0  Labs I have reviewed labs in epic and the results pertinent to this consultation are:  CBC    Component Value Date/Time   WBC 9.5 08/27/2018 1012   RBC 4.89 08/27/2018 1012   HGB 14.6 08/27/2018 1035   HCT 43.0 08/27/2018 1035   PLT 288 08/27/2018 1012   MCV 86.9 08/27/2018 1012   MCH 28.0 08/27/2018 1012   MCHC 32.2 08/27/2018 1012   RDW 13.2 08/27/2018 1012   LYMPHSABS 2.0 08/18/2014 1008   MONOABS 0.5 08/18/2014 1008   EOSABS 0.1 08/18/2014 1008   BASOSABS 0.0 08/18/2014 1008   Glucose 328, creatinine 1.29, GFR 55,  CMP     Component Value Date/Time   NA 138 08/27/2018 1035   K 4.2 08/27/2018 1035   CL 100 08/27/2018 1012   CO2 24 08/27/2018 1012   GLUCOSE 328 (H) 08/27/2018 1012   BUN 16 08/27/2018 1012   CREATININE 1.29 (H) 08/27/2018 1012   CREATININE 1.09 10/23/2011 1614   CALCIUM 10.0 08/27/2018 1012   PROT 7.0 08/18/2014 1008   ALBUMIN 3.6 08/18/2014 1008   AST 14 (L) 08/18/2014 1008   ALT 14 08/18/2014 1008   ALKPHOS 98 08/18/2014 1008   BILITOT 0.5 08/18/2014 1008   GFRNONAA 47 (L) 08/27/2018 1012   GFRAA 55 (L) 08/27/2018 1012   Imaging I have reviewed the images obtained: CT-scan of the brain-not performed. MRI examination of the brain showed acute right cerebellar infarct.  Chronic ischemia with old small medullary and right frontal infarct.  Assessment: 53 year old woman with multiple strokes in the past including brainstem and right MCA territory stroke now presents with an acute onset of dizziness and vomiting and an MRI showing acute right cerebellar infarct. She has chronic right vertebral artery occlusion. At the time of discharge from [facility], she was put on dual antiplatelets for 3 months followed by Plavix alone, but she is currently on dual antiplatelets. She follows with outpatient practice which we do not have access to records for. Strokes-likely small vessel etiology  versus cryptogenic-an embolic etiology should be pursued given multiple distributions, although those locations are more likely to have small vessel strokes, but no clear explanation for her stroke exists. Multiple risk factors-diabetes, hypertension hyperlipidemia as well as obesity can predispose her for getting strokes. Her hypercoagulable  work-up per outpatient notes reviewed from 2012 was essentially unremarkable-care everywhere notes from the hospital in [place].   Impression: Cerebellar stroke  Recommendations: Admit to hospitalist Frequent neurochecks Allow for permissive hypertension-treat blood pressure only if systolic blood pressures greater than 220-on a as needed basis. 2D echocardiogram Hemoglobin A1c Lipid panel PT OT Speech therapy Prophylactic medication: Continue dual antiplatelets for now.  Atorvastatin 89 daily Consider TCD's and transesophageal echocardiogram after the 2D echo is complete to look for any evidence of PFO. Patient is a non-smoker.  No smoking counseling provided. Will need diabetes education on discharge- came in with blood sugars in the 300s.  Patient would like her boyfriend Mr. Vivia Birmingham to be called and informed that she will be getting admitted.  Also she requested that if Mr. Lovena Le could be asked to call patient's daughters and inform them of the admission, that would be appreciated.  I have relayed this request to the patient's RN in the ED.  I have discussed my plan regarding admission with Dr. Stark Jock  Stroke team will follow with you Please call with questions  -- Amie [place], MD [facility] Pager: [phone] If 7pm to 7am, please call on call as listed on AMION.

## 2018-08-28 ENCOUNTER — Inpatient Hospital Stay (HOSPITAL_COMMUNITY): Payer: Medicare Other

## 2018-08-28 ENCOUNTER — Encounter (HOSPITAL_COMMUNITY): Payer: Self-pay | Admitting: Radiology

## 2018-08-28 DIAGNOSIS — I6300 Cerebral infarction due to thrombosis of unspecified precerebral artery: Secondary | ICD-10-CM

## 2018-08-28 DIAGNOSIS — I639 Cerebral infarction, unspecified: Secondary | ICD-10-CM

## 2018-08-28 LAB — GLUCOSE, CAPILLARY
Glucose-Capillary: 251 mg/dL — ABNORMAL HIGH (ref 70–99)
Glucose-Capillary: 269 mg/dL — ABNORMAL HIGH (ref 70–99)
Glucose-Capillary: 279 mg/dL — ABNORMAL HIGH (ref 70–99)
Glucose-Capillary: 286 mg/dL — ABNORMAL HIGH (ref 70–99)
Glucose-Capillary: 318 mg/dL — ABNORMAL HIGH (ref 70–99)
Glucose-Capillary: 374 mg/dL — ABNORMAL HIGH (ref 70–99)

## 2018-08-28 LAB — RAPID URINE DRUG SCREEN, HOSP PERFORMED
Amphetamines: NOT DETECTED
Barbiturates: NOT DETECTED
Benzodiazepines: NOT DETECTED
Cocaine: NOT DETECTED
Opiates: NOT DETECTED
Tetrahydrocannabinol: NOT DETECTED

## 2018-08-28 LAB — ECHOCARDIOGRAM COMPLETE
Height: 68 in
Weight: 4222.25 oz

## 2018-08-28 LAB — LIPID PANEL
Cholesterol: 194 mg/dL (ref 0–200)
HDL: 33 mg/dL — ABNORMAL LOW (ref >40)
LDL Cholesterol: 116 mg/dL — ABNORMAL HIGH (ref 0–99)
Total CHOL/HDL Ratio: 5.9 RATIO
Triglycerides: 224 mg/dL — ABNORMAL HIGH (ref <150)
VLDL: 45 mg/dL — ABNORMAL HIGH (ref 0–40)

## 2018-08-28 LAB — HEMOGLOBIN A1C
Hgb A1c MFr Bld: 10.6 % — ABNORMAL HIGH (ref 4.8–5.6)
Mean Plasma Glucose: 257.52 mg/dL

## 2018-08-28 LAB — HIV ANTIBODY (ROUTINE TESTING W REFLEX): HIV Screen 4th Generation wRfx: NONREACTIVE

## 2018-08-28 LAB — SEDIMENTATION RATE: Sed Rate: 13 mm/hr (ref 0–22)

## 2018-08-28 MED ORDER — ATORVASTATIN CALCIUM 40 MG PO TABS
40.0000 mg | ORAL_TABLET | Freq: Every day | ORAL | Status: DC
  Administered 2018-08-28 – 2018-08-30 (×3): 40 mg via ORAL
  Filled 2018-08-28 (×3): qty 1

## 2018-08-28 MED ORDER — IOHEXOL 350 MG/ML SOLN
50.0000 mL | Freq: Once | INTRAVENOUS | Status: AC | PRN
  Administered 2018-08-28: 50 mL via INTRAVENOUS

## 2018-08-28 MED ORDER — INSULIN ASPART PROT & ASPART (70-30 MIX) 100 UNIT/ML ~~LOC~~ SUSP
45.0000 [IU] | Freq: Two times a day (BID) | SUBCUTANEOUS | Status: DC
  Administered 2018-08-28 – 2018-08-29 (×2): 45 [IU] via SUBCUTANEOUS
  Filled 2018-08-28 (×2): qty 10

## 2018-08-28 NOTE — Evaluation (Signed)
Occupational Therapy Evaluation Patient Details Name: Michelle Brooks MRN: [id number] DOB: Jul 21, 1965 Today's Date: 08/28/2018    History of Present Illness Pt is a 53 yo female s/p Dizziness, N/V found to have acute cerebellar infarct. Pt PMHx: chronic back pain, DMT2, GERD, H/O CVA, HTN.    Clinical Impression   Pt PTA: living alone, independent with mobility using RW or SPC and assist from maintenance at apartment complex to bring groceries to her door. Pt currently performing ADL functional mobility with RW and minugardA; pt overall minguardA for ADL. Vision, WFLs. No focal deficits identified. Pt appears close to her baseline, but reported dizziness after moving head up and down during LB ADL in standing. Pt very motivated to return home and excited to finally have eaten. Pt would benefit from continued OT skilled services to increase independence and safety with ADL. OT following.     Follow Up Recommendations  Home health OT    Equipment Recommendations  None recommended by OT    Recommendations for Other Services       Precautions / Restrictions Precautions Precautions: Fall Restrictions Weight Bearing Restrictions: No      Mobility Bed Mobility Overal bed mobility: Needs Assistance Bed Mobility: Supine to Sit;Sit to Supine     Supine to sit: Supervision Sit to supine: Supervision   General bed mobility comments: SupervisionA for safety  Transfers Overall transfer level: Needs assistance Equipment used: Rolling walker (2 wheeled) Transfers: Sit to/from Stand Sit to Stand: Min guard         General transfer comment: minguardA for stability    Balance Overall balance assessment: Mild deficits observed, not formally tested                                         ADL either performed or assessed with clinical judgement   ADL Overall ADL's : At baseline                                       General ADL Comments: Pt with  dizziness reported after toilet hygiene as pt was dipping head low. Pt performing grooming at sink with set-upA and LB ADL for toilet hygiene with set-upA.      Vision Baseline Vision/History: Wears glasses Wears Glasses: Reading only Vision Assessment?: Yes Eye Alignment: Within Functional Limits Ocular Range of Motion: Within Functional Limits Alignment/Gaze Preference: Within Defined Limits Tracking/Visual Pursuits: Able to track stimulus in all quads without difficulty Additional Comments: no difficulty noted.      Perception     Praxis      Pertinent Vitals/Pain Pain Assessment: No/denies pain     Hand Dominance Right   Extremity/Trunk Assessment Upper Extremity Assessment Upper Extremity Assessment: Overall WFL for tasks assessed   Lower Extremity Assessment Lower Extremity Assessment: Overall WFL for tasks assessed   Cervical / Trunk Assessment Cervical / Trunk Assessment: Normal   Communication Communication Communication: No difficulties   Cognition Arousal/Alertness: Awake/alert Behavior During Therapy: WFL for tasks assessed/performed Overall Cognitive Status: Within Functional Limits for tasks assessed                                     General Comments  VSS.    Exercises  Shoulder Instructions      Home Living Family/patient expects to be discharged to:: Private residence Living Arrangements: Alone Available Help at Discharge: Family;Available PRN/intermittently(family very busy) Type of Home: Apartment Home Access: Stairs to enter Entrance Stairs-Number of Steps: 15 Entrance Stairs-Rails: Left Home Layout: One level     Bathroom Shower/Tub: Chief Strategy OfficerTub/shower unit   Bathroom Toilet: Handicapped height     Home Equipment: Environmental consultantWalker - 2 wheels;Shower seat;Cane - single point;Hand held shower head   Additional Comments: Pt reports sitting to cook and washes dishes to cook. Life alert/      Prior Functioning/Environment Level of  Independence: Independent with assistive device(s)        Comments: Pt grocery shops and maintenance helps bring grocery store items to door. Pt reports sitting to cook and washes dishes to cook. Life alert/ RW for mobility and SPC for bathroom mobility/        OT Problem List: Impaired balance (sitting and/or standing);Decreased activity tolerance;Pain      OT Treatment/Interventions: Self-care/ADL training;Therapeutic exercise;Neuromuscular education;Energy conservation;Therapeutic activities;Patient/family education;Balance training    OT Goals(Current goals can be found in the care plan section) Acute Rehab OT Goals Patient Stated Goal: to go home OT Goal Formulation: With patient Time For Goal Achievement: 09/11/18 Potential to Achieve Goals: Good ADL Goals Additional ADL Goal #1: Pt will perform OOB ADL with modified independence and good safety awareness.  OT Frequency: Min 2X/week   Barriers to D/C: Decreased caregiver support  family not available very often to check in       Co-evaluation              AM-PAC OT "6 Clicks" Daily Activity     Outcome Measure Help from another person eating meals?: None Help from another person taking care of personal grooming?: None Help from another person toileting, which includes using toliet, bedpan, or urinal?: A Little Help from another person bathing (including washing, rinsing, drying)?: A Little Help from another person to put on and taking off regular upper body clothing?: None Help from another person to put on and taking off regular lower body clothing?: None 6 Click Score: 22   End of Session Equipment Utilized During Treatment: Gait belt;Rolling walker Nurse Communication: Mobility status  Activity Tolerance: Patient tolerated treatment well Patient left: in chair;with call bell/phone within reach;with chair alarm set  OT Visit Diagnosis: Unsteadiness on feet (R26.81);Pain Pain - part of body: (back)                 Time: [id number] OT Time Calculation (min): 28 min Charges:  OT General Charges $OT Visit: 1 Visit OT Evaluation $OT Eval Moderate Complexity: 1 Mod  Michelle Brooks OTR/L Acute Rehabilitation Services Pager: [phone] Office: [phone]   Michelle Brooks 08/28/2018, 11:14 AM

## 2018-08-28 NOTE — Progress Notes (Signed)
PROGRESS NOTE    Michelle BoettcherWanda D Brooks  YQM:[id number] DOB: 06/28/1965 DOA: 08/27/2018 PCP: Fleet ContrasAvbuere, Edwin, MD   Brief Narrative:  Patient is a 53 year old female with history of diabetes type 2, hypertension, recurrent strokes, hyperlipidemia who presented with 1 day history of dizziness, nausea and vomiting.  MRI showed moderate-sized acute right cerebellar infarct.  Neurology following.  Stroke work-up initiated.  Assessment & Plan:   Active Problems:   Type 2 diabetes mellitus with hyperlipidemia (HCC)   CVA (cerebral vascular accident) (HCC)   Acute ischemic stroke: MRI showed moderate sized acute right cerebellar infarct.  Patient presented with dizziness, nausea and vomiting.  Currently the symptoms have resolved.  Currently she does not have any focal logical deficits.  Patient has history of multiple ischemic strokes and this is the fourth time. She is on aspirin and Plavix at home.  Neurology following. PT/OT consultation done and recommended home health PT. Echocardiogram showed ejection fraction of 60 to 60%, no source of embolus, impaired left ventricular relaxation. Neurology doing further work-up with CT angio head and neck,lower extremity venous duplex and transcranial Dopplers with bubbles ,ANA, hypercoagulable work-up.Also planning to enroll her in a trial.  Diabetes type 2: Pretty uncontrolled.  Hemoglobin A1c in the range of 10.  On insulin, antihyperglycemics at home.  Currently on sliding scale insulin and 70/30.We will request for diabetic coordinator consult.  Hypertension: Allow permissive hypertension.  Gradually normalize blood pressure in 5 to 7 days.  Use PRN meds for blood pressure more than 220/110 mmHg  Hyperlipidemia: Continue lipitor.LDL of 112            DVT prophylaxis: Lovenox Code Status: Full Family Communication: None present at the bedside Disposition Plan: Home likely tomorrow   Consultants: Neurology  Procedures: MRI  Antimicrobials:   Anti-infectives (From admission, onward)   None      Subjective:  Patient seen and examined the bedside this morning.  Currently hemodynamically stable.  No complaints of nausea or vomiting or dizziness.  Wanted to eat food.  Objective: Vitals:   08/28/18 0200 08/28/18 0415 08/28/18 0618 08/28/18 1050  BP: 136/76 111/65 (!) 144/69 (!) 144/54  Pulse: 88 90 80 86  Resp: 14 15 16 16   Temp: 98.7 F (37.1 C) 98 F (36.7 C) 99.3 F (37.4 C) 98.9 F (37.2 C)  TempSrc: Oral Oral Oral Oral  SpO2: 96% 97% 99% 98%  Weight:      Height:        Intake/Output Summary (Last 24 hours) at 08/28/2018 1300 Last data filed at 08/28/2018 0400 Gross per 24 hour  Intake 1434.44 ml  Output 2 ml  Net 1432.44 ml   Filed Weights   08/27/18 1048 08/27/18 2028  Weight: 113.4 kg 119.7 kg    Examination:  General exam: Appears calm and comfortable ,Not in distress,obese HEENT:PERRL,Oral mucosa moist, Ear/Nose normal on gross exam Respiratory system: Bilateral equal air entry, normal vesicular breath sounds, no wheezes or crackles  Cardiovascular system: S1 & S2 heard, RRR. No JVD, murmurs, rubs, gallops or clicks. No pedal edema. Gastrointestinal system: Abdomen is nondistended, soft and nontender. No organomegaly or masses felt. Normal bowel sounds heard. Central nervous system: Alert and oriented. No focal neurological deficits. Extremities: No edema, no clubbing ,no cyanosis, distal peripheral pulses palpable. Skin: No rashes, lesions or ulcers,no icterus ,no pallor   Data Reviewed: I have personally reviewed following labs and imaging studies  CBC: Recent Labs  Lab 08/27/18 1012 08/27/18 1035  WBC 9.5  --  HGB 13.7 14.6  HCT 42.5 43.0  MCV 86.9  --   PLT 288  --    Basic Metabolic Panel: Recent Labs  Lab 08/27/18 1012 08/27/18 1035  NA 136 138  K 4.2 4.2  CL 100  --   CO2 24  --   GLUCOSE 328*  --   BUN 16  --   CREATININE 1.29*  --   CALCIUM 10.0  --     GFR: Estimated Creatinine Clearance: 68.6 mL/min (A) (by C-G formula based on SCr of 1.29 mg/dL (H)). Liver Function Tests: No results for input(s): AST, ALT, ALKPHOS, BILITOT, PROT, ALBUMIN in the last 168 hours. No results for input(s): LIPASE, AMYLASE in the last 168 hours. No results for input(s): AMMONIA in the last 168 hours. Coagulation Profile: No results for input(s): INR, PROTIME in the last 168 hours. Cardiac Enzymes: No results for input(s): CKTOTAL, CKMB, CKMBINDEX, TROPONINI in the last 168 hours. BNP (last 3 results) No results for input(s): PROBNP in the last 8760 hours. HbA1C: Recent Labs    08/28/18 0336  HGBA1C 10.6*   CBG: Recent Labs  Lab 08/27/18 2033 08/27/18 2244 08/28/18 0026 08/28/18 0437 08/28/18 1101  GLUCAP 235* 271* 279* 318* 374*   Lipid Profile: Recent Labs    08/28/18 0336  CHOL 194  HDL 33*  LDLCALC 116*  TRIG 224*  CHOLHDL 5.9   Thyroid Function Tests: No results for input(s): TSH, T4TOTAL, FREET4, T3FREE, THYROIDAB in the last 72 hours. Anemia Panel: No results for input(s): VITAMINB12, FOLATE, FERRITIN, TIBC, IRON, RETICCTPCT in the last 72 hours. Sepsis Labs: No results for input(s): PROCALCITON, LATICACIDVEN in the last 168 hours.  Recent Results (from the past 240 hour(s))  SARS Coronavirus 2 (CEPHEID - Performed [facility]), Hosp Order     Status: None   Collection Time: 08/27/18  6:54 PM   Specimen: Nasopharyngeal Swab  Result Value Ref Range Status   SARS Coronavirus 2 NEGATIVE NEGATIVE Final    Comment: (NOTE) If result is NEGATIVE SARS-CoV-2 target nucleic acids are NOT DETECTED. The SARS-CoV-2 RNA is generally detectable in upper and lower  respiratory specimens during the acute phase of infection. The lowest  concentration of SARS-CoV-2 viral copies this assay can detect is 250  copies / mL. A negative result does not preclude SARS-CoV-2 infection  and should not be used as the sole basis for  treatment or other  patient management decisions.  A negative result may occur with  improper specimen collection / handling, submission of specimen other  than nasopharyngeal swab, presence of viral mutation(s) within the  areas targeted by this assay, and inadequate number of viral copies  (<250 copies / mL). A negative result must be combined with clinical  observations, patient history, and epidemiological information. If result is POSITIVE SARS-CoV-2 target nucleic acids are DETECTED. The SARS-CoV-2 RNA is generally detectable in upper and lower  respiratory specimens dur ing the acute phase of infection.  Positive  results are indicative of active infection with SARS-CoV-2.  Clinical  correlation with patient history and other diagnostic information is  necessary to determine patient infection status.  Positive results do  not rule out bacterial infection or co-infection with other viruses. If result is PRESUMPTIVE POSTIVE SARS-CoV-2 nucleic acids MAY BE PRESENT.   A presumptive positive result was obtained on the submitted specimen  and confirmed on repeat testing.  While 2019 novel coronavirus  (SARS-CoV-2) nucleic acids may be present in the submitted  sample  additional confirmatory testing may be necessary for epidemiological  and / or clinical management purposes  to differentiate between  SARS-CoV-2 and other Sarbecovirus currently known to infect humans.  If clinically indicated additional testing with an alternate test  methodology [phone]) is advised. The SARS-CoV-2 RNA is generally  detectable in upper and lower respiratory sp ecimens during the acute  phase of infection. The expected result is Negative. Fact Sheet for Patients:  StrictlyIdeas.no Fact Sheet for Healthcare Providers: BankingDealers.co.za This test is not yet approved or cleared by the [place] FDA and has been authorized for detection and/or  diagnosis of SARS-CoV-2 by FDA under an Emergency Use Authorization (EUA).  This EUA will remain in effect (meaning this test can be used) for the duration of the COVID-19 declaration under Section 564(b)(1) of the Act, 21 U.S.C. section 360bbb-3(b)(1), unless the authorization is terminated or revoked sooner. Performed at [facility], [place] [address], [place], [place] [address]          Radiology Studies: Dg Chest 2 View  Result Date: 08/27/2018 CLINICAL DATA:  CVA EXAM: CHEST - 2 VIEW COMPARISON:  None. FINDINGS: Heart is borderline in size. Diffuse interstitial prominence throughout the lungs. Mild vascular congestion. No effusions or acute bony abnormality. IMPRESSION: Borderline heart size with mild vascular congestion and interstitial prominence which could reflect interstitial edema or chronic lung disease. Electronically Signed   By: Rolm Baptise M.D.   On: 08/27/2018 19:49   Mr Brain Wo Contrast  Result Date: 08/27/2018 CLINICAL DATA:  Ataxia, stroke suspected.  Dizziness and vomiting. EXAM: MRI HEAD WITHOUT CONTRAST TECHNIQUE: Multiplanar, multiecho pulse sequences of the brain and surrounding structures were obtained without intravenous contrast. COMPARISON:  Head MRI 08/18/2014, head MRA 08/19/2014, and head and neck CTA 08/19/2014 FINDINGS: Brain: There is patchy restricted diffusion inferiorly and posteriorly in the right cerebellar hemisphere consistent with an acute, moderate-sized PICA territory infarct. There is subtle volume loss posterolaterally in the medulla on the right corresponding to the location of the acute infarct on the prior MRI. A small chronic cortical infarct in the right frontal lobe is unchanged. T2 hyperintensities in the cerebral white matter bilaterally are nonspecific but compatible with mild chronic small vessel ischemic disease, slightly progressed from the prior MRI. There is wallerian degeneration along the left corticospinal tract extending  from the corona radiata to the cerebellar peduncle. No intracranial hemorrhage, mass, midline shift, or extra-axial fluid collection is identified. The ventricles and sulci are within normal limits for age. 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 Vascular: Absent distal right V4 segment flow void consistent with previously demonstrated occlusion. Skull and upper cervical spine: Unremarkable bone marrow signal. Sinuses/Orbits: Unremarkable orbits. Small right maxillary sinus mucous retention cyst. Clear mastoid air cells. Other: None. IMPRESSION: 1. Moderate-sized acute right cerebellar infarct. 2. 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 Electronically Signed   By: Logan Bores M.D.   On: 08/27/2018 16:47        Scheduled Meds:  aspirin EC  81 mg Oral Daily   atorvastatin  40 mg Oral q1800   clopidogrel  75 mg Oral Daily   enoxaparin (LOVENOX) injection  40 mg Subcutaneous Q24H   insulin aspart  0-15 Units Subcutaneous TID WC   insulin aspart  0-5 Units Subcutaneous QHS   insulin aspart protamine- aspart  30 Units Subcutaneous BID WC   linagliptin  5 mg Oral Daily   Continuous Infusions:   LOS: 1 day  Time spent: 35 mins.More than 50% of that time was spent in counseling and/or coordination of care.      Burnadette PopAmrit Seab Axel, MD [facility] Pager [phone]  If 7PM-7AM, please contact night-coverage [email or web address] Password [email or web address] 08/28/2018, 1:00 PM

## 2018-08-28 NOTE — Progress Notes (Signed)
  Echocardiogram 2D Echocardiogram has been performed.  Michelle Brooks 08/28/2018, 9:05 AM

## 2018-08-28 NOTE — Evaluation (Signed)
Physical Therapy Evaluation Patient Details Name: Michelle BoettcherWanda D Littles MRN: [id number] DOB: 11/18/1965 Today's Date: 08/28/2018   History of Present Illness  Pt is a 53 yo female s/p Dizziness, N/V found to have acute cerebellar infarct. Pt PMHx: chronic back pain, DMT2, GERD, H/O CVA, HTN.     Clinical Impression  Patient admitted with the above listed diagnosis. Patient reports Mod I at baseline for mobility and ADLs. Patient lives alone with daughter to assist when available. Patient performing functional mobility at min guard level today for safety with use of RW for increased stability. Patient initially reporting no symptoms, however with increased mobility does report some dizziness - VSS. PT to follow acutely with PT to recommend HHPT at discharge.      Follow Up Recommendations Home health PT;Supervision - Intermittent    Equipment Recommendations  None recommended by PT    Recommendations for Other Services       Precautions / Restrictions Precautions Precautions: Fall Restrictions Weight Bearing Restrictions: No      Mobility  Bed Mobility Overal bed mobility: Needs Assistance Bed Mobility: Supine to Sit;Sit to Supine     Supine to sit: Supervision Sit to supine: Supervision   General bed mobility comments: SupervisionA for safety  Transfers Overall transfer level: Needs assistance Equipment used: Rolling walker (2 wheeled) Transfers: Sit to/from Stand Sit to Stand: Min guard         General transfer comment: minguardA for stability  Ambulation/Gait Ambulation/Gait assistance: Min guard Gait Distance (Feet): 30 Feet Assistive device: Rolling walker (2 wheeled) Gait Pattern/deviations: Step-to pattern;Step-through pattern;Decreased stride length;Trunk flexed Gait velocity: decreased   General Gait Details: patient reporting back pain of 5/10 with mobility; min guard for safety without overt instability - reports dizziness with increased mobility  Stairs            Wheelchair Mobility    Modified Rankin (Stroke Patients Only)       Balance Overall balance assessment: Mild deficits observed, not formally tested                                           Pertinent Vitals/Pain Pain Assessment: No/denies pain    Home Living Family/patient expects to be discharged to:: Private residence Living Arrangements: Alone Available Help at Discharge: Family;Available PRN/intermittently(family very busy) Type of Home: Apartment Home Access: Stairs to enter Entrance Stairs-Rails: Left Entrance Stairs-Number of Steps: 15 Home Layout: One level Home Equipment: Walker - 2 wheels;Shower seat;Cane - single point;Hand held shower head Additional Comments: Pt reports sitting to cook and washes dishes to cook. Life alert/    Prior Function Level of Independence: Independent with assistive device(s)         Comments: Pt grocery shops and maintenance helps bring grocery store items to door. Pt reports sitting to cook and washes dishes to cook. Life alert/ RW for mobility and SPC for bathroom mobility/     Hand Dominance   Dominant Hand: Right    Extremity/Trunk Assessment   Upper Extremity Assessment Upper Extremity Assessment: Overall WFL for tasks assessed    Lower Extremity Assessment Lower Extremity Assessment: Overall WFL for tasks assessed    Cervical / Trunk Assessment Cervical / Trunk Assessment: Normal  Communication   Communication: No difficulties  Cognition Arousal/Alertness: Awake/alert Behavior During Therapy: WFL for tasks assessed/performed Overall Cognitive Status: Within Functional Limits for tasks assessed  General Comments General comments (skin integrity, edema, etc.): VSS    Exercises     Assessment/Plan    PT Assessment Patient needs continued PT services  PT Problem List Decreased strength;Decreased activity  tolerance;Decreased balance;Decreased mobility;Decreased coordination;Decreased safety awareness       PT Treatment Interventions DME instruction;Gait training;Stair training;Functional mobility training;Therapeutic activities;Therapeutic exercise;Balance training;Neuromuscular re-education;Patient/family education    PT Goals (Current goals can be found in the Care Plan section)  Acute Rehab PT Goals Patient Stated Goal: to go home PT Goal Formulation: With patient Time For Goal Achievement: 09/11/18 Potential to Achieve Goals: Good    Frequency Min 4X/week   Barriers to discharge        Co-evaluation PT/OT/SLP Co-Evaluation/Treatment: Yes Reason for Co-Treatment: For patient/therapist safety;To address functional/ADL transfers PT goals addressed during session: Mobility/safety with mobility;Balance;Proper use of DME         AM-PAC PT "6 Clicks" Mobility  Outcome Measure Help needed turning from your back to your side while in a flat bed without using bedrails?: A Little Help needed moving from lying on your back to sitting on the side of a flat bed without using bedrails?: A Little Help needed moving to and from a bed to a chair (including a wheelchair)?: A Little Help needed standing up from a chair using your arms (e.g., wheelchair or bedside chair)?: A Little Help needed to walk in hospital room?: A Little Help needed climbing 3-5 steps with a railing? : A Lot 6 Click Score: 17    End of Session Equipment Utilized During Treatment: Gait belt Activity Tolerance: Patient tolerated treatment well Patient left: in chair;with call bell/phone within reach;with chair alarm set Nurse Communication: Mobility status PT Visit Diagnosis: Unsteadiness on feet (R26.81);Other abnormalities of gait and mobility (R26.89);Muscle weakness (generalized) (M62.81)    Time: 3893-7342 PT Time Calculation (min) (ACUTE ONLY): 28 min   Charges:   PT Evaluation $PT Eval Moderate  Complexity: 1 Mod          Lanney Gins, PT, DPT Supplemental Physical Therapist 08/28/18 11:26 AM Pager: [phone] Office: [phone]

## 2018-08-28 NOTE — Progress Notes (Signed)
Patient constantly asking for food all night, saying I am diabetic and you (Nurse) keep starving me for no reason". Patient was given explanation why she has been kept NPO. According to admitting MD's note which reads "Admit to neuro telemetry for observation and keep n.p.o". On call MD was notified about patient passing swallow screen and needed diet order, bu RN attention was drawn to Admitting MD's note. Will pass it on to in coming staff.

## 2018-08-28 NOTE — Progress Notes (Signed)
STROKE TEAM PROGRESS NOTE   HISTORY OF PRESENT ILLNESS (per record) Michelle Brooks Akey is a 53 y.o. female who was past medical history of right MCA stroke in 2012 with nearly resolved symptoms with no residual deficit, reports found in care everywhere, and reports having had at least 2 or 3 more strokes, another stroke involving the right lateral medulla and right inferior cerebral peduncle in the region of the right PICA territory in 2016 again with no residual deficits, diabetes, hypertension, hyperlipidemia, headaches, chronic back pain, who had a sudden onset of dizziness and multiple episodes of vomiting this morning. She reports that she went to bed last known normal at 9 PM, and woke up this morning feeling unsteady and unstable as if she has been skating for a while and things around her are moving without her moving with them making her feel dizzy.  She also vomited multiple times. She denied any focal tingling numbness or weakness but said that usually when she walks with a walker, it is not too much of a problem for her but today walking with a walker was very bothersome and she felt as if she is going to fall if she continued to try to walk. Her examination in the ED was unremarkable but there was a suspicion for small vessel stroke given her history of strokes and MRI was obtained that showed a scattered right cerebellar infarct and absent distal right V4 segment flow void consistent with previously demonstrated occlusion. She denied having been feeling sick prior to this.  Denies prior headaches but said this morning she had a headache on the right side of her head. Denies shortness of breath, chest pain, fevers chills.  Denies abdominal pain.  Endorses nausea and vomiting as above. Denies any bleeding clotting issues.  For outpatient care, [facility]-I do not have access to any charts from an outpatient neurology care after her discharge from the hospital in 2016-last  progress note by Dr. Roda ShuttersXu.  LKW: 9 PM on 08/26/2018 tpa given?: no, outside the window Premorbid modified Rankin scale (mRS): 3-uses a walker to walk due to chronic back pain and has been using it for a while.   SUBJECTIVE (INTERVAL HISTORY) I have personally reviewed history of presenting illness with the patient.  She presented with dizziness and MRI shows a right posterior inferior cerebral artery infarct.  Patient states that her dizziness is improving but she is not back to her baseline.  2D echo shows normal ejection fraction.  LDL cholesterol and hemoglobin A1c are both elevated.  This is the patient's fourth stroke since 2012.  She was on aspirin and Plavix and states she was compliant with her medications and cannot understand why her diabetes and lipids are yet uncontrolled.  Despite multiple strokes she has no lasting physical deficits from the stroke but does use a cane to walk due to chronic back pain.    OBJECTIVE Vitals:   08/28/18 0030 08/28/18 0200 08/28/18 0415 08/28/18 0618  BP: (!) 148/60 136/76 111/65 (!) 144/69  Pulse: 87 88 90 80  Resp: 15 14 15 16   Temp: 97.7 F (36.5 C) 98.7 F (37.1 C) 98 F (36.7 C) 99.3 F (37.4 C)  TempSrc: Axillary Oral Oral Oral  SpO2: 98% 96% 97% 99%  Weight:      Height:        CBC:  Recent Labs  Lab 08/27/18 1012 08/27/18 1035  WBC 9.5  --   HGB 13.7 14.6  HCT 42.5 43.0  MCV 86.9  --   PLT 288  --     Basic Metabolic Panel:  Recent Labs  Lab 08/27/18 1012 08/27/18 1035  NA 136 138  K 4.2 4.2  CL 100  --   CO2 24  --   GLUCOSE 328*  --   BUN 16  --   CREATININE 1.29*  --   CALCIUM 10.0  --     Lipid Panel:     Component Value Date/Time   CHOL 194 08/28/2018 0336   TRIG 224 (H) 08/28/2018 0336   HDL 33 (L) 08/28/2018 0336   CHOLHDL 5.9 08/28/2018 0336   VLDL 45 (H) 08/28/2018 0336   LDLCALC 116 (H) 08/28/2018 0336   HgbA1c:  Lab Results  Component Value Date   HGBA1C 10.6 (H) 08/28/2018   Urine Drug  Screen: No results found for: LABOPIA, COCAINSCRNUR, LABBENZ, AMPHETMU, THCU, LABBARB  Al[facility] Level [facility]  Dg Chest 2 View 08/27/2018 IMPRESSION:  Borderline heart size with mild vascular congestion and interstitial prominence which could reflect interstitial edema or chronic lung disease.  Mr Brain Wo Contrast 08/27/2018 IMPRESSION:  1. Moderate-sized acute right cerebellar infarct.  2. 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    Transthoracic Echocardiogram  08/28/2018 IMPRESSIONS  1. The left ventricle has normal systolic function with an ejection fraction of 60-65%. The cavity size was normal. There is mildly increased left ventricular wall thickness. Left ventricular diastolic Doppler parameters are consistent with impaired relaxation. Indeterminate filling pressures The E/e' is 8-15. No evidence of left ventricular regional wall motion abnormalities.  2. The right ventricle has normal systolic function. The cavity was normal. There is no increase in right ventricular wall thickness.  3. The mitral valve is abnormal. Mild thickening of the mitral valve leaflet.  4. The tricuspid valve is grossly normal.  5. The aortic valve is tricuspid. Mild sclerosis of the aortic valve. No stenosis of the aortic valve.  6. The ascending aorta and aortic root are normal in size and structure.   Bilateral Carotid Dopplers  00/00/2020 not ordered   EKG - SR rate 81 BPM. (See cardiology reading for complete details)    PHYSICAL EXAM Blood pressure (!) 144/69, pulse 80, temperature 99.3 F (37.4 C), temperature source Oral, resp. rate 16, height 5\' 8"  (1.727 m), weight 119.7 kg, SpO2 99 %. Pleasant obese middle-aged African-American lady currently not in distress. . Afebrile. Head is nontraumatic. Neck is supple without bruit.    Cardiac exam no murmur or gallop. Lungs are clear to auscultation. Distal pulses are well  felt.  Neurological Exam ;  Awake  Alert oriented x 3. Normal speech and language.eye movements full without nystagmus.fundi were not visualized. Vision acuity and fields appear normal. Hearing is normal. Palatal movements are normal. Face symmetric. Tongue midline. Normal strength, tone, reflexes and coordination. Normal sensation. Gait deferred. NIHSS 0        ASSESSMENT/PLAN [name] Mensch is a 53 y.o. female with history of multiple strokes with no residual deficits, diabetes, hypertension, hyperlipidemia, headaches, and chronic back pain, presenting with sudden onset of dizziness and multiple episodes of vomiting this morning.. She did not receive IV t-PA due to late presentation (>4.5 hours from time of onset).   Stroke:   acute right cerebellar infarct - embolic - unknown source  Resultant  dizziness  CT head  - not ordered  MRI head - Moderate-sized acute  right cerebellar infarct. 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   MRA head  - not ordered  CTA H&N  - not ordered  Carotid Doppler - not ordered  2D Echo  - EF 60 - 65%. No cardiac source of emboli identified.   Sars Corona Virus 2 - negative  LDL - 116  HgbA1c - 10.6  UDS - not performed  VTE prophylaxis - Lovenox  Diet  - Carb modified with thin liquids  aspirin 81 mg daily and clopidogrel 75 mg daily prior to admission, now on aspirin 81 mg daily and clopidogrel 75 mg daily  Patient counseled to be compliant with her antithrombotic medications  Ongoing aggressive stroke risk factor management  Therapy recommendations:  pending  Disposition:  Pending  Hypertension  Stable . Permissive hypertension (OK if < 220/120) but gradually normalize in 5-7 days . Long-term BP goal normotensive  Hyperlipidemia  Lipid lowering medication PTA:  Pravastatin 40 mg daily  LDL 116, goal < 70  Current lipid lowering medication: Change to Lipitor 40 mg daily  Continue statin  at discharge  Diabetes  HgbA1c 10.6, goal < 7.0  Uncontrolled  Other Stroke Risk Factors  Former cigarette smoker - quit  Obesity, Body mass index is 40.12 kg/m., recommend weight loss, diet and exercise as appropriate   Hx stroke/TIA  Other Active Problems  Latex allergy  Cre - 1.29  PLAN  Check CT angiogram of the brain and neck to look for significant intra-or extracranial stenosis.  Check transcranial Doppler bubble study for PFO and lower extremity venous Dopplers for DVT  Check labs for vasculitis and hypercoagulability  Consider possible participation in the BMS stroke trial if interested   Hospital day # 1  I have personally obtained history,examined this patient, reviewed notes, independently viewed imaging studies, participated in medical decision making and plan of care.ROS completed by me personally and pertinent positives fully documented  I have made any additions or clarifications directly to the above note.  She presented with recurrent cryptogenic stroke.  Continue ongoing stroke work-up and check CT angiograms, TCD bubble study for PFO, lower extremity venous Doppler for DVT and hypercoagulable and vasculitic labs.  Continue cardiac monitoring.  Consider possible participation in the BMS stroke trial if interested.  Discussed with Dr. Tawanna Solo Greater than 50% time during this 35-minute visit was spent on counseling and coordination of care about her stroke and answering questions  Antony Contras, [place] Pager: [phone] 08/28/2018 12:59 PM   To contact Stroke Continuity provider, please refer to [email or web address] After hours, contact General Neurology

## 2018-08-29 LAB — GLUCOSE, CAPILLARY
Glucose-Capillary: 206 mg/dL — ABNORMAL HIGH (ref 70–99)
Glucose-Capillary: 228 mg/dL — ABNORMAL HIGH (ref 70–99)
Glucose-Capillary: 125 mg/dL — ABNORMAL HIGH (ref 70–99)
Glucose-Capillary: 105 mg/dL — ABNORMAL HIGH (ref 70–99)

## 2018-08-29 LAB — BASIC METABOLIC PANEL
Anion gap: 7 (ref 5–15)
BUN: 16 mg/dL (ref 6–20)
CO2: 27 mmol/L (ref 22–32)
Calcium: 8.9 mg/dL (ref 8.9–10.3)
Chloride: 104 mmol/L (ref 98–111)
Creatinine, Ser: 1.15 mg/dL — ABNORMAL HIGH (ref 0.44–1.00)
GFR calc Af Amer: >60 mL/min (ref >60)
GFR calc non Af Amer: 54 mL/min — ABNORMAL LOW (ref >60)
Glucose, Bld: 192 mg/dL — ABNORMAL HIGH (ref 70–99)
Potassium: 3.7 mmol/L (ref 3.5–5.1)
Sodium: 138 mmol/L (ref 135–145)

## 2018-08-29 MED ORDER — ONDANSETRON HCL 4 MG/2ML IJ SOLN
4.0000 mg | Freq: Four times a day (QID) | INTRAMUSCULAR | Status: DC | PRN
  Filled 2018-08-29: qty 2

## 2018-08-29 MED ORDER — MECLIZINE HCL 12.5 MG PO TABS
25.0000 mg | ORAL_TABLET | Freq: Three times a day (TID) | ORAL | Status: DC | PRN
  Administered 2018-08-29: 25 mg via ORAL
  Filled 2018-08-29: qty 2

## 2018-08-29 MED ORDER — INSULIN ASPART PROT & ASPART (70-30 MIX) 100 UNIT/ML ~~LOC~~ SUSP
60.0000 [IU] | Freq: Two times a day (BID) | SUBCUTANEOUS | Status: DC
  Administered 2018-08-29 – 2018-08-31 (×4): 60 [IU] via SUBCUTANEOUS
  Filled 2018-08-29: qty 10

## 2018-08-29 NOTE — Progress Notes (Signed)
STROKE TEAM PROGRESS NOTE      SUBJECTIVE (INTERVAL HISTORY)   She she continues to complain of dizziness which persist.  Lower extremity venous Dopplers and transcranial Doppler bubble study are both pending yet..  ESR is normal.  Hypercoagulable labs and vasculitic labs are pending.  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 OBJECTIVE Vitals:   08/28/18 2032 08/29/18 0030 08/29/18 0500 08/29/18 0801  BP: (!) 158/81 (!) 145/79 (!) 152/91 (!) 161/82  Pulse:   78 78  Resp:    16  Temp: 99.1 F (37.3 C) 98.6 F (37 C) 98.2 F (36.8 C) 98.8 F (37.1 C)  TempSrc: Oral Oral Oral Oral  SpO2: 99% 96% 100% 98%  Weight:      Height:        CBC:  Recent Labs  Lab 08/27/18 1012 08/27/18 1035  WBC 9.5  --   HGB 13.7 14.6  HCT 42.5 43.0  MCV 86.9  --   PLT 288  --     Basic Metabolic Panel:  Recent Labs  Lab 08/27/18 1012 08/27/18 1035 08/29/18 0508  NA 136 138 138  K 4.2 4.2 3.7  CL 100  --  104  CO2 24  --  27  GLUCOSE 328*  --  192*  BUN 16  --  16  CREATININE 1.29*  --  1.15*  CALCIUM 10.0  --  8.9    Lipid Panel:     Component Value Date/Time   CHOL 194 08/28/2018 0336   TRIG 224 (H) 08/28/2018 0336   HDL 33 (L) 08/28/2018 0336   CHOLHDL 5.9 08/28/2018 0336   VLDL 45 (H) 08/28/2018 0336   LDLCALC 116 (H) 08/28/2018 0336   HgbA1c:  Lab Results  Component Value Date   HGBA1C 10.6 (H) 08/28/2018   Urine Drug Screen:     Component Value Date/Time   LABOPIA NONE DETECTED 08/28/2018 1143   COCAINSCRNUR NONE DETECTED 08/28/2018 1143   LABBENZ NONE DETECTED 08/28/2018 1143   AMPHETMU NONE DETECTED 08/28/2018 1143   THCU NONE DETECTED 08/28/2018 1143   LABBARB NONE DETECTED 08/28/2018 1143    Alcohol Level No results found for: [place]  Dg Chest 2 View 08/27/2018 IMPRESSION:  Borderline heart size with mild vascular congestion and interstitial prominence which could  reflect interstitial edema or chronic lung disease.  MRI Brain Wo Contrast 08/27/2018 IMPRESSION:  1. Moderate-sized acute right cerebellar infarct.  2. 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    Transthoracic Echocardiogram  08/28/2018 IMPRESSIONS  1. The left ventricle has normal systolic function with an ejection fraction of 60-65%. The cavity size was normal. There is mildly increased left ventricular wall thickness. Left ventricular diastolic Doppler parameters are consistent with impaired relaxation. Indeterminate filling pressures The E/e' is 8-15. No evidence of left ventricular regional wall motion abnormalities.  2. The right ventricle has normal systolic function. The cavity was normal. There is no increase in right ventricular wall thickness.  3. The mitral valve is abnormal. Mild thickening of the mitral valve leaflet.  4. The tricuspid valve is grossly normal.  5. The aortic valve is tricuspid. Mild sclerosis of the aortic valve. No stenosis of the aortic valve.  6. The ascending aorta and aortic root are normal in size and structure.   Bilateral Carotid Dopplers  00/00/2020 not ordered   EKG - SR rate 81 BPM. (See cardiology reading for  complete details)    PHYSICAL EXAM Blood pressure (!) 161/82, pulse 78, temperature 98.8 F (37.1 C), temperature source Oral, resp. rate 16, height _0  (1.727 m), weight 119.7 kg, SpO2 98 %. Pleasant obese middle-aged African-American lady currently not in distress. . Afebrile. Head is nontraumatic. Neck is supple without bruit.    Cardiac exam no murmur or gallop. Lungs are clear to auscultation. Distal pulses are well felt.  Neurological Exam ;  Awake  Alert oriented x 3. Normal speech and language.eye movements full without nystagmus.fundi were not visualized. Vision acuity and fields appear normal. Hearing is normal. Palatal movements are normal. Face symmetric. Tongue midline. Normal strength,  tone, reflexes and coordination. Normal sensation. Gait deferred.          ASSESSMENT/PLAN Michelle Brooks is a 53 y.o. female with history of multiple strokes with no residual deficits, diabetes, hypertension, hyperlipidemia, headaches, and chronic back pain, presenting with sudden onset of dizziness and multiple episodes of vomiting this morning.. She did not receive IV t-PA due to late presentation (>4.5 hours from time of onset).   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   MRA head  - not ordered  CTA H&N  - not ordered  Carotid Doppler - not ordered  2D Echo  - EF 60 - 65%. No cardiac source of emboli identified.   Sars Corona Virus 2 - negative  LDL - 116  HgbA1c - 10.6  UDS - not performed  VTE prophylaxis - Lovenox  Diet  - Carb modified with thin liquids  aspirin 81 mg daily and clopidogrel 75 mg daily prior to admission, now on aspirin 81 mg daily and clopidogrel 75 mg daily  Patient counseled to be compliant with her antithrombotic medications  Ongoing aggressive stroke risk factor management  Therapy recommendations:  pending  Disposition:  Pending  Hypertension  Stable . Permissive hypertension (OK if < 220/120) but gradually normalize in 5-7 days . Long-term BP goal normotensive  Hyperlipidemia  Lipid lowering medication PTA:  Pravastatin 40 mg daily  LDL 116, goal < 70  Current lipid lowering medication: Change to Lipitor 40 mg daily  Continue statin at discharge  Diabetes  HgbA1c 10.6, goal < 7.0  Uncontrolled  Other Stroke Risk Factors  Former cigarette smoker - quit  Obesity, Body mass index is 40.12 kg/m., recommend weight loss, diet and exercise as appropriate   Hx stroke/TIA  Other Active Problems  Latex  allergy  Cre - 1.29  PLAN   .  Patient did not participate in the BMS stroke trial  Check transcranial Doppler bubble study for PFO and lower extremity venous Dopplers for DVT  Check labs for vasculitis and hypercoagulability Consider TEE and loop recorder as they have not been done in the past  Hospital day # 2  I have personally obtained history,examined this patient, reviewed notes, independently viewed imaging studies, participated in medical decision making and plan of care.ROS completed by me personally and pertinent positives fully documented  I have made any additions or clarifications directly to the above note.  She presented with recurrent cryptogenic stroke.  Continue ongoing stroke work-up and check CT angiograms, TCD bubble study for PFO, lower extremity venous Doppler for DVT and hypercoagulable and vasculitic labs.  Continue cardiac monitoring.  Check TEE and loop recorder..  Discussed with Dr. Tawanna Solo Greater than 50% time during this 25-minute visit was spent on counseling and coordination of care about her stroke and answering questions  Antony Contras, [place] Pager: [phone] 08/29/2018 12:19 PM   To contact Stroke Continuity provider, please refer to [email or web address] After hours, contact General Neurology

## 2018-08-29 NOTE — H&P (View-Only) (Signed)
STROKE TEAM PROGRESS NOTE      SUBJECTIVE (INTERVAL HISTORY)   She she continues to complain of dizziness which persist.  Lower extremity venous Dopplers and transcranial Doppler bubble study are both pending yet..  ESR is normal.  Hypercoagulable labs and vasculitic labs are pending.  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 OBJECTIVE Vitals:   08/28/18 2032 08/29/18 0030 08/29/18 0500 08/29/18 0801  BP: (!) 158/81 (!) 145/79 (!) 152/91 (!) 161/82  Pulse:   78 78  Resp:    16  Temp: 99.1 F (37.3 C) 98.6 F (37 C) 98.2 F (36.8 C) 98.8 F (37.1 C)  TempSrc: Oral Oral Oral Oral  SpO2: 99% 96% 100% 98%  Weight:      Height:        CBC:  Recent Labs  Lab 08/27/18 1012 08/27/18 1035  WBC 9.5  --   HGB 13.7 14.6  HCT 42.5 43.0  MCV 86.9  --   PLT 288  --     Basic Metabolic Panel:  Recent Labs  Lab 08/27/18 1012 08/27/18 1035 08/29/18 0508  NA 136 138 138  K 4.2 4.2 3.7  CL 100  --  104  CO2 24  --  27  GLUCOSE 328*  --  192*  BUN 16  --  16  CREATININE 1.29*  --  1.15*  CALCIUM 10.0  --  8.9    Lipid Panel:     Component Value Date/Time   CHOL 194 08/28/2018 0336   TRIG 224 (H) 08/28/2018 0336   HDL 33 (L) 08/28/2018 0336   CHOLHDL 5.9 08/28/2018 0336   VLDL 45 (H) 08/28/2018 0336   LDLCALC 116 (H) 08/28/2018 0336   HgbA1c:  Lab Results  Component Value Date   HGBA1C 10.6 (H) 08/28/2018   Urine Drug Screen:     Component Value Date/Time   LABOPIA NONE DETECTED 08/28/2018 1143   COCAINSCRNUR NONE DETECTED 08/28/2018 1143   LABBENZ NONE DETECTED 08/28/2018 1143   AMPHETMU NONE DETECTED 08/28/2018 1143   THCU NONE DETECTED 08/28/2018 1143   LABBARB NONE DETECTED 08/28/2018 1143    Alcohol Level No results found for: [place]  Dg Chest 2 View 08/27/2018 IMPRESSION:  Borderline heart size with mild vascular congestion and interstitial prominence which could  reflect interstitial edema or chronic lung disease.  MRI Brain Wo Contrast 08/27/2018 IMPRESSION:  1. Moderate-sized acute right cerebellar infarct.  2. 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    Transthoracic Echocardiogram  08/28/2018 IMPRESSIONS  1. The left ventricle has normal systolic function with an ejection fraction of 60-65%. The cavity size was normal. There is mildly increased left ventricular wall thickness. Left ventricular diastolic Doppler parameters are consistent with impaired relaxation. Indeterminate filling pressures The E/e' is 8-15. No evidence of left ventricular regional wall motion abnormalities.  2. The right ventricle has normal systolic function. The cavity was normal. There is no increase in right ventricular wall thickness.  3. The mitral valve is abnormal. Mild thickening of the mitral valve leaflet.  4. The tricuspid valve is grossly normal.  5. The aortic valve is tricuspid. Mild sclerosis of the aortic valve. No stenosis of the aortic valve.  6. The ascending aorta and aortic root are normal in size and structure.   Bilateral Carotid Dopplers  00/00/2020 not ordered   EKG - SR rate 81 BPM. (See cardiology reading for  complete details)    PHYSICAL EXAM Blood pressure (!) 161/82, pulse 78, temperature 98.8 F (37.1 C), temperature source Oral, resp. rate 16, height _0  (1.727 m), weight 119.7 kg, SpO2 98 %. Pleasant obese middle-aged African-American lady currently not in distress. . Afebrile. Head is nontraumatic. Neck is supple without bruit.    Cardiac exam no murmur or gallop. Lungs are clear to auscultation. Distal pulses are well felt.  Neurological Exam ;  Awake  Alert oriented x 3. Normal speech and language.eye movements full without nystagmus.fundi were not visualized. Vision acuity and fields appear normal. Hearing is normal. Palatal movements are normal. Face symmetric. Tongue midline. Normal strength,  tone, reflexes and coordination. Normal sensation. Gait deferred.          ASSESSMENT/PLAN [name] is a 53 y.o. female with history of multiple strokes with no residual deficits, diabetes, hypertension, hyperlipidemia, headaches, and chronic back pain, presenting with sudden onset of dizziness and multiple episodes of vomiting this morning.. She did not receive IV t-PA due to late presentation (>4.5 hours from time of onset).   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   MRA head  - not ordered  CTA H&N  - not ordered  Carotid Doppler - not ordered  2D Echo  - EF 60 - 65%. No cardiac source of emboli identified.   Sars Corona Virus 2 - negative  LDL - 116  HgbA1c - 10.6  UDS - not performed  VTE prophylaxis - Lovenox  Diet  - Carb modified with thin liquids  aspirin 81 mg daily and clopidogrel 75 mg daily prior to admission, now on aspirin 81 mg daily and clopidogrel 75 mg daily  Patient counseled to be compliant with her antithrombotic medications  Ongoing aggressive stroke risk factor management  Therapy recommendations:  pending  Disposition:  Pending  Hypertension  Stable . Permissive hypertension (OK if < 220/120) but gradually normalize in 5-7 days . Long-term BP goal normotensive  Hyperlipidemia  Lipid lowering medication PTA:  Pravastatin 40 mg daily  LDL 116, goal < 70  Current lipid lowering medication: Change to Lipitor 40 mg daily  Continue statin at discharge  Diabetes  HgbA1c 10.6, goal < 7.0  Uncontrolled  Other Stroke Risk Factors  Former cigarette smoker - quit  Obesity, Body mass index is 40.12 kg/m., recommend weight loss, diet and exercise as appropriate   Hx stroke/TIA  Other Active Problems  Latex  allergy  Cre - 1.29  PLAN   .  Patient did not participate in the BMS stroke trial  Check transcranial Doppler bubble study for PFO and lower extremity venous Dopplers for DVT  Check labs for vasculitis and hypercoagulability Consider TEE and loop recorder as they have not been done in the past  Hospital day # 2  I have personally obtained history,examined this patient, reviewed notes, independently viewed imaging studies, participated in medical decision making and plan of care.ROS completed by me personally and pertinent positives fully documented  I have made any additions or clarifications directly to the above note.  She presented with recurrent cryptogenic stroke.  Continue ongoing stroke work-up and check CT angiograms, TCD bubble study for PFO, lower extremity venous Doppler for DVT and hypercoagulable and vasculitic labs.  Continue cardiac monitoring.  Check TEE and loop recorder..  Discussed with Dr. Tawanna Solo Greater than 50% time during this 25-minute visit was spent on counseling and coordination of care about her stroke and answering questions  Antony Contras, [place] Pager: [phone] 08/29/2018 12:19 PM   To contact Stroke Continuity provider, please refer to [email or web address] After hours, contact General Neurology

## 2018-08-29 NOTE — Progress Notes (Signed)
Inpatient Diabetes Program Recommendations  AACE/ADA: New Consensus Statement on Inpatient Glycemic Control (2015)  Target Ranges:  Prepandial:   less than 140 mg/dL      Peak postprandial:   less than 180 mg/dL (1-2 hours)      Critically ill patients:  140 - 180 mg/dL   Lab Results  Component Value Date   GLUCAP 228 (H) 08/29/2018   HGBA1C 10.6 (H) 08/28/2018    Review of Glycemic Control  Diabetes history: DM2 Outpatient Diabetes medications: Humalog 50/50 60-80 units tidwc, Januvia 100 mg QD, metformin 500 mg bid Current orders for Inpatient glycemic control: 70/30 60 units bid, Novolog 0-15 units tidwc and 0-5 units QHS  HgbA1C - 10.6% - uncontrolled. Pt to be NPO after MN for ongoing stroke work-up in am. Blood sugars 206, 228 today - increased 70/30 to 60 units bid.  Will speak with pt regarding her diabetes control at home.  Inpatient Diabetes Program Recommendations:     Change Novolog to 0-15 units Q4H. Give 70/30 am insulin dose when pt eats first meal on 7/20.  Continue to follow.  Thank you. Lorenda Peck, RD, LDN, CDE Inpatient Diabetes Coordinator [phone]

## 2018-08-29 NOTE — Progress Notes (Signed)
PROGRESS NOTE    Michelle Brooks  WUJ:[id number] DOB: 04/15/1965 DOA: 08/27/2018 PCP: Fleet Contras, MD   Brief Narrative:  Patient is a 53 year old female with history of diabetes type 2, hypertension, recurrent strokes, hyperlipidemia who presented with 1 day history of dizziness, nausea and vomiting.  MRI showed moderate-sized acute right cerebellar infarct.  Neurology following.  Stroke work-up initiated.  Assessment & Plan:   Active Problems:   Type 2 diabetes mellitus with hyperlipidemia (HCC)   CVA (cerebral vascular accident) (HCC)   Acute ischemic stroke: MRI showed moderate sized acute right cerebellar infarct.  Patient presented with dizziness, nausea and vomiting.  Currently she does not have any focal neurological deficits.  Patient has history of multiple ischemic strokes and this is the fourth time. She is on aspirin and Plavix at home.  Neurology following. PT/OT consultation done and recommended home health PT. Echocardiogram showed ejection fraction of 60 to 60%, no source of embolus, impaired left ventricular relaxation. CT angio head and neck showed Intracranial occlusive disease progressive over time.R/O vasculitis Pending lower extremity venous duplex and transcranial Dopplers with bubbles ,ANA, hypercoagulable work-up. We will request cardiology for TEE and loop recorder for the work-up of cryptogenic stroke.  Dizziness: Complains of dizziness this morning.  Will order meclizine  Diabetes type 2: Pretty uncontrolled.  Hemoglobin A1c in the range of 10.  On insulin, antihyperglycemics at home.  Currently on sliding scale insulin and 70/30.We will request for diabetic coordinator consult.  Hypertension: Allow permissive hypertension.  Gradually normalize blood pressure in 5 to 7 days.  Use PRN meds for blood pressure more than 220/110 mmHg  Hyperlipidemia: Continue lipitor.LDL of 112            DVT prophylaxis: Lovenox Code Status: Full Family  Communication: None present at the bedside Disposition Plan: Home likely tomorrow   Consultants: Neurology  Procedures: MRI  Antimicrobials:  Anti-infectives (From admission, onward)   None      Subjective:  Patient seen and examined the bedside this morning.  Hemodynamically stable.  Complains of dizziness this morning.  Meclizine ordered.  No other new complaints.  Objective: Vitals:   08/28/18 2032 08/29/18 0030 08/29/18 0500 08/29/18 0801  BP: (!) 158/81 (!) 145/79 (!) 152/91 (!) 161/82  Pulse:   78 78  Resp:    16  Temp: 99.1 F (37.3 C) 98.6 F (37 C) 98.2 F (36.8 C) 98.8 F (37.1 C)  TempSrc: Oral Oral Oral Oral  SpO2: 99% 96% 100% 98%  Weight:      Height:        Intake/Output Summary (Last 24 hours) at 08/29/2018 1238 Last data filed at 08/29/2018 1116 Gross per 24 hour  Intake 760 ml  Output 950 ml  Net -190 ml   Filed Weights   08/27/18 1048 08/27/18 2028  Weight: 113.4 kg 119.7 kg    Examination:  General exam: Appears calm and comfortable ,Not in distress,obese HEENT:PERRL,Oral mucosa moist, Ear/Nose normal on gross exam Respiratory system: Bilateral equal air entry, normal vesicular breath sounds, no wheezes or crackles  Cardiovascular system: S1 & S2 heard, RRR. No JVD, murmurs, rubs, gallops or clicks. No pedal edema. Gastrointestinal system: Abdomen is nondistended, soft and nontender. No organomegaly or masses felt. Normal bowel sounds heard. Central nervous system: Alert and oriented. No focal neurological deficits. Extremities: No edema, no clubbing ,no cyanosis, distal peripheral pulses palpable. Skin: No rashes, lesions or ulcers,no icterus ,no pallor   Data Reviewed: I have personally reviewed  following labs and imaging studies  CBC: Recent Labs  Lab 08/27/18 1012 08/27/18 1035  WBC 9.5  --   HGB 13.7 14.6  HCT 42.5 43.0  MCV 86.9  --   PLT 288  --    Basic Metabolic Panel: Recent Labs  Lab 08/27/18 1012 08/27/18 1035  08/29/18 0508  NA 136 138 138  K 4.2 4.2 3.7  CL 100  --  104  CO2 24  --  27  GLUCOSE 328*  --  192*  BUN 16  --  16  CREATININE 1.29*  --  1.15*  CALCIUM 10.0  --  8.9   GFR: Estimated Creatinine Clearance: 77 mL/min (A) (by C-G formula based on SCr of 1.15 mg/dL (H)). Liver Function Tests: No results for input(s): AST, ALT, ALKPHOS, BILITOT, PROT, ALBUMIN in the last 168 hours. No results for input(s): LIPASE, AMYLASE in the last 168 hours. No results for input(s): AMMONIA in the last 168 hours. Coagulation Profile: No results for input(s): INR, PROTIME in the last 168 hours. Cardiac Enzymes: No results for input(s): CKTOTAL, CKMB, CKMBINDEX, TROPONINI in the last 168 hours. BNP (last 3 results) No results for input(s): PROBNP in the last 8760 hours. HbA1C: Recent Labs    08/28/18 0336  HGBA1C 10.6*   CBG: Recent Labs  Lab 08/28/18 1306 08/28/18 1722 08/28/18 2106 08/29/18 0649 08/29/18 1227  GLUCAP 286* 269* 251* 206* 228*   Lipid Profile: Recent Labs    08/28/18 0336  CHOL 194  HDL 33*  LDLCALC 116*  TRIG 224*  CHOLHDL 5.9   Thyroid Function Tests: No results for input(s): TSH, T4TOTAL, FREET4, T3FREE, THYROIDAB in the last 72 hours. Anemia Panel: No results for input(s): VITAMINB12, FOLATE, FERRITIN, TIBC, IRON, RETICCTPCT in the last 72 hours. Sepsis Labs: No results for input(s): PROCALCITON, LATICACIDVEN in the last 168 hours.  Recent Results (from the past 240 hour(s))  SARS Coronavirus 2 (CEPHEID - Performed in [facility]), Hosp Order     Status: None   Collection Time: 08/27/18  6:54 PM   Specimen: Nasopharyngeal Swab  Result Value Ref Range Status   SARS Coronavirus 2 NEGATIVE NEGATIVE Final    Comment: (NOTE) If result is NEGATIVE SARS-CoV-2 target nucleic acids are NOT DETECTED. The SARS-CoV-2 RNA is generally detectable in upper and lower  respiratory specimens during the acute phase of infection. The lowest   concentration of SARS-CoV-2 viral copies this assay can detect is 250  copies / mL. A negative result does not preclude SARS-CoV-2 infection  and should not be used as the sole basis for treatment or other  patient management decisions.  A negative result may occur with  improper specimen collection / handling, submission of specimen other  than nasopharyngeal swab, presence of viral mutation(s) within the  areas targeted by this assay, and inadequate number of viral copies  (<250 copies / mL). A negative result must be combined with clinical  observations, patient history, and epidemiological information. If result is POSITIVE SARS-CoV-2 target nucleic acids are DETECTED. The SARS-CoV-2 RNA is generally detectable in upper and lower  respiratory specimens dur ing the acute phase of infection.  Positive  results are indicative of active infection with SARS-CoV-2.  Clinical  correlation with patient history and other diagnostic information is  necessary to determine patient infection status.  Positive results do  not rule out bacterial infection or co-infection with other viruses. If result is PRESUMPTIVE POSTIVE SARS-CoV-2 nucleic acids MAY BE PRESENT.  A presumptive positive result was obtained on the submitted specimen  and confirmed on repeat testing.  While 2019 novel coronavirus  (SARS-CoV-2) nucleic acids may be present in the submitted sample  additional confirmatory testing may be necessary for epidemiological  and / or clinical management purposes  to differentiate between  SARS-CoV-2 and other Sarbecovirus currently known to infect humans.  If clinically indicated additional testing with an alternate test  methodology [phone] is advised. The SARS-CoV-2 RNA is generally  detectable in upper and lower respiratory sp ecimens during the acute  phase of infection. The expected result is Negative. Fact Sheet for Patients:  BoilerBrush.com.cyhttps://www.fda.gov/media/136312/download Fact Sheet  for Healthcare Providers: https://pope.com/https://www.fda.gov/media/136313/download This test is not yet approved or cleared by the [place] FDA and has been authorized for detection and/or diagnosis of SARS-CoV-2 by FDA under an Emergency Use Authorization (EUA).  This EUA will remain in effect (meaning this test can be used) for the duration of the COVID-19 declaration under Section 564(b)(1) of the Act, 21 U.S.C. section 360bbb-3(b)(1), unless the authorization is terminated or revoked sooner. Performed at [facility], [address] [place], [place] [id number]          Radiology Studies: Ct Angio Head W Or Wo Contrast  Result Date: 08/28/2018 CLINICAL DATA:  Ataxia. Dizziness and vomiting. Right cerebellar infarction. Chronic right M1 occlusion. EXAM: CT ANGIOGRAPHY HEAD AND NECK TECHNIQUE: Multidetector CT imaging of the head and neck was performed using the standard protocol during bolus administration of intravenous contrast. Multiplanar CT image reconstructions and MIPs were obtained to evaluate the vascular anatomy. Carotid stenosis measurements (when applicable) are obtained utilizing NASCET criteria, using the distal internal carotid diameter as the denominator. CONTRAST:  50mL OMNIPAQUE IOHEXOL 350 MG/ML SOLN COMPARISON:  MRI yesterday.  Previous CT angiography 08/19/2014 FINDINGS: CT HEAD FINDINGS Brain: Low-density in the right cerebellum corresponding to the acute infarction shown by MRI. No evidence of hemorrhage or mass effect. Old cortical and subcortical infarctions in the right frontal and parietal region. No mass, hydrocephalus or extra-axial collection. Vascular: No abnormal vascular finding by noncontrast CT. Skull: Negative Sinuses: Clear Orbits: Normal Review of the MIP images confirms the above findings CTA NECK FINDINGS Aortic arch: No aortic atherosclerosis, aneurysm or dissection. Common origin of the innominate artery and left common carotid artery. Mild atherosclerotic  calcification of the proximal left subclavian artery without stenosis. Right carotid system: Common carotid artery shows some areas of intimal thickening but no stenosis greater than 20%. Carotid bifurcation is widely patent without soft or calcified plaque. Cervical ICA is widely patent through the cervical region to the skull base. Left carotid system: Common carotid artery widely patent to the bifurcation. Carotid bifurcation and ICA bulb are patent without calcified or soft plaque. Cervical ICA is widely patent, though slightly smaller than the right. Vertebral arteries: Both vertebral artery origins are patent. Left vertebral artery is larger than the right. Both vertebral arteries are patent through the cervical region to the foramen magnum. Skeleton: Ordinary cervical spondylosis. Anterior bridging osteophytes at C5-6. Other neck: No mass or lymphadenopathy. Thyroid goiter as seen previously with multiple nodules, some with peripheral calcification. Upper chest: Emphysema and pulmonary scarring in the lung apices. Review of the MIP images confirms the above findings CTA HEAD FINDINGS Anterior circulation: Both internal carotid arteries are patent through the skull base. Carotid siphon regions show some calcification on the right but none on the left. Maximal stenosis in the right siphon region estimated at 30%. On  the left, stenosis in the proximal siphon is more pronounced, estimated at 50%. This has worsened slightly since 2016. On the right, there is chronic occlusion of the M1 segment with distal vessels opacified by collaterals. The right anterior cerebral artery is patent. On the left, there is occlusion or near occlusion of the anterior and middle cerebral vessels developed over the last 2 years. Posterior circulation: Right vertebral artery is a narrow and irregular vessel which supplies PICA. Left vertebral artery is a small vessel that is patent to the basilar, with stenosis at the vertebrobasilar  junction. More distal basilar artery does show flow. Superior cerebellar and posterior cerebral arteries show flow. Posterior cerebral arteries receive there supply from the anterior circulation primarily. Small contribution on the right from the basilar tip. Venous sinuses: Patent and normal. Anatomic variants: None significant. Delayed phase: Not performed. Review of the MIP images confirms the above findings IMPRESSION: Intracranial occlusive disease progressive over time. Worsened appearance of the distal right vertebral artery which is very narrow and irregular, giving some supply to a small right PICA. Left vertebral artery is becoming stenotic distally with stenosis at the vertebrobasilar junction. Basilar artery is narrow and irregular. 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 Right anterior cerebral artery remains patent. Right PCA takes fetal origin from the right carotid. Left carotid artery shows a 50% stenosis in the siphon and occlusion of both the anterior and middle cerebral vessels on the left. Left anterior cerebral artery receives it supply from a patent anterior communicating artery. Left middle cerebral artery receives it supply from collateral vessels and possibly a small amount of antegrade flow. 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 Electronically Signed   By: Paulina FusiMark  Shogry M.D.   On: 08/28/2018 13:11   Dg Chest 2 View  Result Date: 08/27/2018 CLINICAL DATA:  CVA EXAM: CHEST - 2 VIEW COMPARISON:  None. FINDINGS: Heart is borderline in size. Diffuse interstitial prominence throughout the lungs. Mild vascular congestion. No effusions or acute bony abnormality. IMPRESSION: Borderline heart size with mild vascular congestion and interstitial prominence which could reflect interstitial edema or chronic lung disease. Electronically Signed   By: Charlett NoseKevin  Dover M.D.   On: 08/27/2018 19:49   Ct Angio Neck W Or Wo Contrast  Result Date: 08/28/2018 CLINICAL DATA:   Ataxia. Dizziness and vomiting. Right cerebellar infarction. Chronic right M1 occlusion. EXAM: CT ANGIOGRAPHY HEAD AND NECK TECHNIQUE: Multidetector CT imaging of the head and neck was performed using the standard protocol during bolus administration of intravenous contrast. Multiplanar CT image reconstructions and MIPs were obtained to evaluate the vascular anatomy. Carotid stenosis measurements (when applicable) are obtained utilizing NASCET criteria, using the distal internal carotid diameter as the denominator. CONTRAST:  50mL OMNIPAQUE IOHEXOL 350 MG/ML SOLN COMPARISON:  MRI yesterday.  Previous CT angiography 08/19/2014 FINDINGS: CT HEAD FINDINGS Brain: Low-density in the right cerebellum corresponding to the acute infarction shown by MRI. No evidence of hemorrhage or mass effect. Old cortical and subcortical infarctions in the right frontal and parietal region. No mass, hydrocephalus or extra-axial collection. Vascular: No abnormal vascular finding by noncontrast CT. Skull: Negative Sinuses: Clear Orbits: Normal Review of the MIP images confirms the above findings CTA NECK FINDINGS Aortic arch: No aortic atherosclerosis, aneurysm or dissection. Common origin of the innominate artery and left common carotid artery. Mild atherosclerotic calcification of the proximal left subclavian artery without stenosis. Right carotid system: Common carotid artery shows some areas of intimal thickening but no stenosis greater than 20%.  Carotid bifurcation is widely patent without soft or calcified plaque. Cervical ICA is widely patent through the cervical region to the skull base. Left carotid system: Common carotid artery widely patent to the bifurcation. Carotid bifurcation and ICA bulb are patent without calcified or soft plaque. Cervical ICA is widely patent, though slightly smaller than the right. Vertebral arteries: Both vertebral artery origins are patent. Left vertebral artery is larger than the right. Both vertebral  arteries are patent through the cervical region to the foramen magnum. Skeleton: Ordinary cervical spondylosis. Anterior bridging osteophytes at C5-6. Other neck: No mass or lymphadenopathy. Thyroid goiter as seen previously with multiple nodules, some with peripheral calcification. Upper chest: Emphysema and pulmonary scarring in the lung apices. Review of the MIP images confirms the above findings CTA HEAD FINDINGS Anterior circulation: Both internal carotid arteries are patent through the skull base. Carotid siphon regions show some calcification on the right but none on the left. Maximal stenosis in the right siphon region estimated at 30%. On the left, stenosis in the proximal siphon is more pronounced, estimated at 50%. This has worsened slightly since 2016. On the right, there is chronic occlusion of the M1 segment with distal vessels opacified by collaterals. The right anterior cerebral artery is patent. On the left, there is occlusion or near occlusion of the anterior and middle cerebral vessels developed over the last 2 years. Posterior circulation: Right vertebral artery is a narrow and irregular vessel which supplies PICA. Left vertebral artery is a small vessel that is patent to the basilar, with stenosis at the vertebrobasilar junction. More distal basilar artery does show flow. Superior cerebellar and posterior cerebral arteries show flow. Posterior cerebral arteries receive there supply from the anterior circulation primarily. Small contribution on the right from the basilar tip. Venous sinuses: Patent and normal. Anatomic variants: None significant. Delayed phase: Not performed. Review of the MIP images confirms the above findings IMPRESSION: Intracranial occlusive disease progressive over time. Worsened appearance of the distal right vertebral artery which is very narrow and irregular, giving some supply to a small right PICA. Left vertebral artery is becoming stenotic distally with stenosis at the  vertebrobasilar junction. Basilar artery is narrow and irregular. 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 Right anterior cerebral artery remains patent. Right PCA takes fetal origin from the right carotid. Left carotid artery shows a 50% stenosis in the siphon and occlusion of both the anterior and middle cerebral vessels on the left. Left anterior cerebral artery receives it supply from a patent anterior communicating artery. Left middle cerebral artery receives it supply from collateral vessels and possibly a small amount of antegrade flow. 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 Electronically Signed   By: Paulina Fusi M.D.   On: 08/28/2018 13:11   Mr Brain Wo Contrast  Result Date: 08/27/2018 CLINICAL DATA:  Ataxia, stroke suspected.  Dizziness and vomiting. EXAM: MRI HEAD WITHOUT CONTRAST TECHNIQUE: Multiplanar, multiecho pulse sequences of the brain and surrounding structures were obtained without intravenous contrast. COMPARISON:  Head MRI 08/18/2014, head MRA 08/19/2014, and head and neck CTA 08/19/2014 FINDINGS: Brain: There is patchy restricted diffusion inferiorly and posteriorly in the right cerebellar hemisphere consistent with an acute, moderate-sized PICA territory infarct. There is subtle volume loss posterolaterally in the medulla on the right corresponding to the location of the acute infarct on the prior MRI. A small chronic cortical infarct in the right frontal lobe is unchanged. T2 hyperintensities in the cerebral white matter bilaterally are nonspecific but  compatible with mild chronic small vessel ischemic disease, slightly progressed from the prior MRI. There is wallerian degeneration along the left corticospinal tract extending from the corona radiata to the cerebellar peduncle. No intracranial hemorrhage, mass, midline shift, or extra-axial fluid collection is identified. The ventricles and sulci are within normal limits for age. A partially empty sella is  unchanged. Vascular: Absent distal right V4 segment flow void consistent with previously demonstrated occlusion. Skull and upper cervical spine: Unremarkable bone marrow signal. Sinuses/Orbits: Unremarkable orbits. Small right maxillary sinus mucous retention cyst. Clear mastoid air cells. Other: None. IMPRESSION: 1. Moderate-sized acute right cerebellar infarct. 2. 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 Electronically Signed   By: Logan Bores M.D.   On: 08/27/2018 16:47        Scheduled Meds:  aspirin EC  81 mg Oral Daily   atorvastatin  40 mg Oral q1800   clopidogrel  75 mg Oral Daily   enoxaparin (LOVENOX) injection  40 mg Subcutaneous Q24H   insulin aspart  0-15 Units Subcutaneous TID WC   insulin aspart  0-5 Units Subcutaneous QHS   insulin aspart protamine- aspart  60 Units Subcutaneous BID WC   linagliptin  5 mg Oral Daily   Continuous Infusions:   LOS: 2 days    Time spent: 35 mins.More than 50% of that time was spent in counseling and/or coordination of care.      Shelly Coss, MD [facility] Pager [phone]  If 7PM-7AM, please contact night-coverage [email or web address] Password [facility] 08/29/2018, 12:38 PM

## 2018-08-29 NOTE — Progress Notes (Signed)
Physical Therapy Treatment Patient Details Name: Michelle Brooks MRN: [id number] DOB: 1966-01-21 Today's Date: 08/29/2018    History of Present Illness Pt is a 53 yo female s/p Dizziness, N/V found to have acute cerebellar infarct. Pt PMHx: chronic back pain, DMT2, GERD, H/O CVA, HTN.     PT Comments    Patient seen for mobility progression - improved stability today. Discussed refraining from using purewick and begin ambulating with nursing staff to restroom throughout day to promote improved mobility with patient in agreement. Generally min guard with functional mobility for safety - no LOB. Will continue to follow.     Follow Up Recommendations  Home health PT;Supervision - Intermittent     Equipment Recommendations  None recommended by PT    Recommendations for Other Services       Precautions / Restrictions Precautions Precautions: Fall Restrictions Weight Bearing Restrictions: No    Mobility  Bed Mobility Overal bed mobility: Needs Assistance Bed Mobility: Supine to Sit;Sit to Supine     Supine to sit: Supervision Sit to supine: Supervision   General bed mobility comments: SupervisionA for safety  Transfers Overall transfer level: Needs assistance Equipment used: Rolling walker (2 wheeled) Transfers: Sit to/from Stand Sit to Stand: Min guard         General transfer comment: min guard for immediate standing balance  Ambulation/Gait Ambulation/Gait assistance: Min guard;Supervision Gait Distance (Feet): 30 Feet Assistive device: Rolling walker (2 wheeled) Gait Pattern/deviations: Step-through pattern;Decreased stride length;Trunk flexed Gait velocity: decreased   General Gait Details: improved back pain; pateint states she typically only ambulates short distances - declines mobility outside of room as she states the lights bother her eyes   Stairs             Wheelchair Mobility    Modified Rankin (Stroke Patients Only)       Balance  Overall balance assessment: Mild deficits observed, not formally tested                                          Cognition Arousal/Alertness: Awake/alert Behavior During Therapy: WFL for tasks assessed/performed Overall Cognitive Status: Within Functional Limits for tasks assessed                                        Exercises      General Comments General comments (skin integrity, edema, etc.): VSS      Pertinent Vitals/Pain Pain Assessment: No/denies pain    Home Living                      Prior Function            PT Goals (current goals can now be found in the care plan section) Acute Rehab PT Goals Patient Stated Goal: to go home PT Goal Formulation: With patient Time For Goal Achievement: 09/11/18 Potential to Achieve Goals: Good    Frequency    Min 4X/week      PT Plan Current plan remains appropriate    Co-evaluation              AM-PAC PT "6 Clicks" Mobility   Outcome Measure  Help needed turning from your back to your side while in a flat bed without using bedrails?: None Help needed moving  from lying on your back to sitting on the side of a flat bed without using bedrails?: A Little Help needed moving to and from a bed to a chair (including a wheelchair)?: A Little Help needed standing up from a chair using your arms (e.g., wheelchair or bedside chair)?: A Little Help needed to walk in hospital room?: A Little Help needed climbing 3-5 steps with a railing? : A Lot 6 Click Score: 18    End of Session Equipment Utilized During Treatment: Gait belt Activity Tolerance: Patient tolerated treatment well Patient left: in bed;with call bell/phone within reach;with nursing/sitter in room Nurse Communication: Mobility status PT Visit Diagnosis: Unsteadiness on feet (R26.81);Other abnormalities of gait and mobility (R26.89);Muscle weakness (generalized) (M62.81)     Time: [id number] PT Time  Calculation (min) (ACUTE ONLY): 18 min  Charges:  $Gait Training: 8-22 mins                     Kipp LaurenceStephanie R Aaron, PT, DPT Supplemental Physical Therapist 08/29/18 2:23 PM Pager: [phone] Office: [phone]

## 2018-08-30 ENCOUNTER — Inpatient Hospital Stay (HOSPITAL_COMMUNITY): Payer: Medicaid Other

## 2018-08-30 ENCOUNTER — Encounter (HOSPITAL_COMMUNITY)
Admission: EM | Disposition: A | Discharge status: Home / Self Care | Payer: Self-pay | Source: Home / Self Care | Attending: Internal Medicine

## 2018-08-30 ENCOUNTER — Inpatient Hospital Stay (HOSPITAL_COMMUNITY): Payer: Medicare Other | Admitting: Anesthesiology

## 2018-08-30 ENCOUNTER — Inpatient Hospital Stay (HOSPITAL_COMMUNITY): Payer: Medicare Other

## 2018-08-30 ENCOUNTER — Encounter (HOSPITAL_COMMUNITY): Payer: Self-pay

## 2018-08-30 DIAGNOSIS — I340 Nonrheumatic mitral (valve) insufficiency: Secondary | ICD-10-CM

## 2018-08-30 DIAGNOSIS — I82409 Acute embolism and thrombosis of unspecified deep veins of unspecified lower extremity: Secondary | ICD-10-CM

## 2018-08-30 DIAGNOSIS — I6389 Other cerebral infarction: Secondary | ICD-10-CM

## 2018-08-30 HISTORY — PX: LOOP RECORDER INSERTION: EP1214

## 2018-08-30 HISTORY — PX: TEE WITHOUT CARDIOVERSION: SHX5443

## 2018-08-30 HISTORY — PX: BUBBLE STUDY: SHX6837

## 2018-08-30 LAB — ENA+DNA/DS+ANTICH+CENTRO+JO...
Anti JO-1: <0.2 AI (ref 0.0–0.9)
Centromere Ab Screen: <0.2 AI (ref 0.0–0.9)
Chromatin Ab SerPl-aCnc: <0.2 AI (ref 0.0–0.9)
ENA SM Ab Ser-aCnc: <0.2 AI (ref 0.0–0.9)
Ribonucleic Protein: 1.4 AI — ABNORMAL HIGH (ref 0.0–0.9)
SSA (Ro) (ENA) Antibody, IgG: <0.2 AI (ref 0.0–0.9)
SSB (La) (ENA) Antibody, IgG: <0.2 AI (ref 0.0–0.9)
Scleroderma (Scl-70) (ENA) Antibody, IgG: 0.2 AI (ref 0.0–0.9)
ds DNA Ab: <1 IU/mL (ref 0–9)

## 2018-08-30 LAB — BASIC METABOLIC PANEL
Anion gap: 7 (ref 5–15)
BUN: 15 mg/dL (ref 6–20)
CO2: 27 mmol/L (ref 22–32)
Calcium: 8.5 mg/dL — ABNORMAL LOW (ref 8.9–10.3)
Chloride: 103 mmol/L (ref 98–111)
Creatinine, Ser: 1.14 mg/dL — ABNORMAL HIGH (ref 0.44–1.00)
GFR calc Af Amer: >60 mL/min (ref >60)
GFR calc non Af Amer: 55 mL/min — ABNORMAL LOW (ref >60)
Glucose, Bld: 164 mg/dL — ABNORMAL HIGH (ref 70–99)
Potassium: 3.4 mmol/L — ABNORMAL LOW (ref 3.5–5.1)
Sodium: 137 mmol/L (ref 135–145)

## 2018-08-30 LAB — GLUCOSE, CAPILLARY
Glucose-Capillary: 156 mg/dL — ABNORMAL HIGH (ref 70–99)
Glucose-Capillary: 156 mg/dL — ABNORMAL HIGH (ref 70–99)
Glucose-Capillary: 208 mg/dL — ABNORMAL HIGH (ref 70–99)
Glucose-Capillary: 213 mg/dL — ABNORMAL HIGH (ref 70–99)
Glucose-Capillary: 304 mg/dL — ABNORMAL HIGH (ref 70–99)
Glucose-Capillary: 69 mg/dL — ABNORMAL LOW (ref 70–99)
Glucose-Capillary: 74 mg/dL (ref 70–99)
Glucose-Capillary: 79 mg/dL (ref 70–99)

## 2018-08-30 LAB — ANCA TITERS
Atypical P-ANCA titer: <1:20 {titer}
C-ANCA: <1:20 {titer}
P-ANCA: <1:20 {titer}

## 2018-08-30 LAB — SICKLE CELL SCREEN: Sickle Cell Screen: NEGATIVE

## 2018-08-30 LAB — CARDIOLIPIN ANTIBODIES, IGG, IGM, IGA
Anticardiolipin IgA: <9 APL U/mL (ref 0–11)
Anticardiolipin IgG: <9 GPL U/mL (ref 0–14)
Anticardiolipin IgM: <9 MPL U/mL (ref 0–12)

## 2018-08-30 LAB — ANA W/REFLEX IF POSITIVE: Anti Nuclear Antibody (ANA): POSITIVE — AB

## 2018-08-30 LAB — RHEUMATOID FACTOR: Rheumatoid fact SerPl-aCnc: <10 IU/mL (ref 0.0–13.9)

## 2018-08-30 SURGERY — LOOP RECORDER INSERTION
Anesthesia: LOCAL

## 2018-08-30 SURGERY — ECHOCARDIOGRAM, TRANSESOPHAGEAL
Anesthesia: Monitor Anesthesia Care

## 2018-08-30 MED ORDER — ACETAMINOPHEN 325 MG PO TABS: 325.0000 mg | ORAL_TABLET | ORAL | Status: DC | PRN

## 2018-08-30 MED ORDER — ONDANSETRON HCL 4 MG/2ML IJ SOLN: 4.0000 mg | Freq: Four times a day (QID) | INTRAMUSCULAR | Status: DC | PRN

## 2018-08-30 MED ORDER — MECLIZINE HCL 25 MG PO TABS: 25.0000 mg | ORAL_TABLET | Freq: Three times a day (TID) | ORAL | 0 refills | Status: DC | PRN

## 2018-08-30 MED ORDER — PROPOFOL 10 MG/ML IV BOLUS
INTRAVENOUS | Status: DC | PRN
  Administered 2018-08-30: 20 mg via INTRAVENOUS
  Administered 2018-08-30: 11 mg via INTRAVENOUS

## 2018-08-30 MED ORDER — SODIUM CHLORIDE 0.9 % IV SOLN
INTRAVENOUS | Status: DC
  Administered 2018-08-30: 13:00:00 via INTRAVENOUS

## 2018-08-30 MED ORDER — LIDOCAINE-EPINEPHRINE 1 %-1:100000 IJ SOLN
INTRAMUSCULAR | Status: AC
  Filled 2018-08-30: qty 1

## 2018-08-30 MED ORDER — LIDOCAINE HCL (CARDIAC) PF 100 MG/5ML IV SOSY
PREFILLED_SYRINGE | INTRAVENOUS | Status: DC | PRN
  Administered 2018-08-30: 60 mg via INTRAVENOUS

## 2018-08-30 MED ORDER — PROPOFOL 500 MG/50ML IV EMUL
INTRAVENOUS | Status: DC | PRN
  Administered 2018-08-30: 50 ug/kg/min via INTRAVENOUS

## 2018-08-30 MED ORDER — ATORVASTATIN CALCIUM 40 MG PO TABS: 40.0000 mg | ORAL_TABLET | Freq: Every day | ORAL | 0 refills | Status: DC

## 2018-08-30 MED ORDER — LIDOCAINE-EPINEPHRINE 1 %-1:100000 IJ SOLN
INTRAMUSCULAR | Status: DC | PRN
  Administered 2018-08-30: 20 mL

## 2018-08-30 MED ORDER — DEXMEDETOMIDINE HCL 200 MCG/2ML IV SOLN
INTRAVENOUS | Status: DC | PRN
  Administered 2018-08-30 (×3): 8 ug via INTRAVENOUS

## 2018-08-30 SURGICAL SUPPLY — 2 items
LOOP REVEAL LINQSYS (Prosthesis & Implant Heart) ×1 IMPLANT
PACK LOOP INSERTION (CUSTOM PROCEDURE TRAY) ×2 IMPLANT

## 2018-08-30 NOTE — Progress Notes (Signed)
Bilateral lower extremity venous duplex has been completed. Preliminary results can be found in CV Proc through chart review.   08/30/18 10:57 AM Carlos Levering RVT

## 2018-08-30 NOTE — Progress Notes (Signed)
OT Cancellation Note  Patient Details Name: Michelle Brooks MRN: [id number] DOB: 14-Feb-1965   Cancelled Treatment:    Reason Eval/Treat Not Completed: Patient at procedure or test/ unavailable. OT will follow up when available.   Gypsy Decant, MS, OTR/L 08/30/2018, 2:53 PM

## 2018-08-30 NOTE — Consult Note (Addendum)
ELECTROPHYSIOLOGY CONSULT NOTE  Patient ID: Michelle Brooks MRN: [id number], DOB/AGE: 1965-03-03, [birth year], AGE 53   Admit date: 08/27/2018 Date of Consult: 08/30/2018  Primary Physician: Nolene Ebbs, MD Primary Cardiologist: none Reason for Consultation: Cryptogenic stroke ; recommendations regarding Implantable Loop Recorder, requested by Dr. Leonie Man   History of Present Illness Michelle Brooks was admitted on 08/27/2018 with dizziness, stroke.   PMHx includes: DM, HTN, HLD, Chronic back pain, headaches  Neurology notes: acute right cerebellar infarct - embolic - unknown source,  Moderate-sized acute right cerebellar infarct. Chronic ischemia with old small medullary and right frontal infarcts she has undergone workup for stroke including echocardiogram and carotid angio.  The patient has been monitored on telemetry which has demonstrated sinus rhythm with no arrhythmias.    Inpatient stroke work-up is to be completed with a TEE.   Echocardiogram this admission demonstrated   Transthoracic Echocardiogram  08/28/2018 IMPRESSIONS 1. The left ventricle has normal systolic function with an ejection fraction of 60-65%. The cavity size was normal. There is mildly increased left ventricular wall thickness. Left ventricular diastolic Doppler parameters are consistent with impaired relaxation. Indeterminate filling pressures The E/e' is 8-15. No evidence of left ventricular regional wall motion abnormalities. 2. The right ventricle has normal systolic function. The cavity was normal. There is no increase in right ventricular wall thickness. 3. The mitral valve is abnormal. Mild thickening of the mitral valve leaflet. 4. The tricuspid valve is grossly normal. 5. The aortic valve is tricuspid. Mild sclerosis of the aortic valve. No stenosis of the aortic valve. 6. The ascending aorta and aortic root are normal in size and structure.  Lab work is reviewed.  Prior to admission, the patient denies chest pain,  shortness of breath, dizziness, palpitations, or syncope.  They are recovering from their stroke with plans to home at discharge.   Past Medical History:  Diagnosis Date   Chronic back pain    Diabetes mellitus type 2 in obese (HCC)    Diabetic retinopathy ([place])    GERD (gastroesophageal reflux disease)    Headache    History of stroke 03/2010   Right MCA stroke in 03/2010, with MRI also noting subacute strokes in left fronto parietal area - occured in [place] received care at [facility] thought due to uncontrolled blood pressure // No residual deficits // TTE (03/2010) at [facility] - normal LV systolic function, moderate pericardial effusion with 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   HSV-2 infection    Hyperlipidemia LDL goal < 100 04/01/2011   Hypertension    Lumbago    pain in the back   Stroke [facility])      Surgical History:  Past Surgical History:  Procedure Laterality Date   BREAST LUMPECTOMY  2002   states benign lymph node removed.     Medications Prior to Admission  Medication Sig Dispense Refill Last Dose   acyclovir (ZOVIRAX) 400 MG tablet Take 1 tablet (400 mg total) by mouth 3 (three) times daily. (Patient taking differently: Take 400 mg by mouth 3 (three) times daily. Prn for flare up) 21 tablet 1 unk at Unknown time   aspirin 81 MG tablet Take 81 mg by mouth daily.   08/26/2018 at Unknown time   clopidogrel (PLAVIX) 75 MG tablet Take 75 mg by mouth daily.   08/26/2018 at 0830   diclofenac sodium (VOLTAREN) 1 % GEL Apply 2 g topically 4 (four) times daily as needed for  pain.   Past Month at Unknown time   gabapentin (NEURONTIN) 300 MG capsule Take 300 mg by mouth 3 (three) times daily.   Past Month at Unknown time   insulin lispro protamine-lispro (HUMALOG 50/50) (50-50) 100 UNIT/ML SUSP injection Inject 80 Units into the skin 3 (three) times daily. Up to 60 depending on cbg   08/26/2018 at Unknown time    JANUVIA 100 MG tablet Take 100 mg by mouth daily.   08/26/2018 at Unknown time   lisinopril-hydrochlorothiazide (PRINZIDE,ZESTORETIC) 20-12.5 MG per tablet Take 1 tablet by mouth daily.   08/26/2018 at Unknown time   metFORMIN (GLUCOPHAGE) 500 MG tablet Take 500 mg by mouth 2 (two) times daily with a meal.   08/26/2018 at Unknown time   pravastatin (PRAVACHOL) 40 MG tablet Take 1 tablet (40 mg total) by mouth daily. 30 tablet 5 08/26/2018 at Unknown time   Blood Glucose Monitoring Suppl (ACCU-CHEK AVIVA PLUS) W/DEVICE KIT 1 kit by Does not apply route 3 (three) times daily before meals. 1 kit 0    Blood Glucose Monitoring Suppl (TRUE METRIX METER) DEVI 1 each by Does not apply route 4 (four) times daily -  before meals and at bedtime. 1 Device 0    fluconazole (DIFLUCAN) 150 MG tablet Take 1 tablet (150 mg total) by mouth daily. (Patient not taking: Reported on 08/27/2018) 1 tablet 1 Not Taking at Unknown time   glucose blood (TRUE METRIX BLOOD GLUCOSE TEST) test strip Use as instructed 100 each 5    nystatin-triamcinolone ointment (MYCOLOG) Apply 1 application topically 2 (two) times daily. (Patient not taking: Reported on 08/27/2018) 30 g 0 Not Taking at Unknown time   topiramate (TOPAMAX) 50 MG tablet Take 1 tablet (50 mg total) by mouth 2 (two) times daily. Start 1 tablet daily x 1 week then twice daily (Patient not taking: Reported on 08/27/2018) 60 tablet 3 Not Taking at Unknown time   TRUEPLUS LANCETS 28G MISC 1 each by Does not apply route 4 (four) times daily -  before meals and at bedtime. 100 each 5    valACYclovir (VALTREX) 1000 MG tablet TAKE 1 TABLET BY MOUTH ONCE DAILY (Patient not taking: Reported on 08/27/2018) 5 tablet 0 Not Taking at Unknown time    Inpatient Medications:   aspirin EC  81 mg Oral Daily   atorvastatin  40 mg Oral q1800   clopidogrel  75 mg Oral Daily   enoxaparin (LOVENOX) injection  40 mg Subcutaneous Q24H   insulin aspart  0-15 Units Subcutaneous  TID WC   insulin aspart  0-5 Units Subcutaneous QHS   insulin aspart protamine- aspart  60 Units Subcutaneous BID WC   linagliptin  5 mg Oral Daily    Allergies:  Allergies  Allergen Reactions   Lactose Intolerance (Gi) Diarrhea and Nausea And Vomiting   Latex     Like a burning in area    Social History   Socioeconomic History   Marital status: Divorced    Spouse name: Not on file   Number of children: 3   Years of education: 12th grade   Highest education level: Not on file  Occupational History   Occupation: unemployed    Comment: previously worked in [place] before her stroke  Social Designer, fashion/clothing strain: Not on file   Food insecurity    Worry: Not on file    Inability: Not on file   Transportation needs    Medical: Not on file  Non-medical: Not on file  Tobacco Use   Smoking status: Former Smoker    Packs/day: 0.20    Years: 7.00    Pack years: 1.40    Types: Cigarettes    Quit date: 03/26/2004    Years since quitting: 14.4   Smokeless tobacco: Never Used  Substance and Sexual Activity   Alcohol use: No   Drug use: No   Sexual activity: Yes    Partners: Male    Birth control/protection: Condom  Lifestyle   Physical activity    Days per week: Not on file    Minutes per session: Not on file   Stress: Not on file  Relationships   Social connections    Talks on phone: Not on file    Gets together: Not on file    Attends religious service: Not on file    Active member of club or organization: Not on file    Attends meetings of clubs or organizations: Not on file    Relationship status: Not on file   Intimate partner violence    Fear of current or ex partner: Not on file    Emotionally abused: Not on file    Physically abused: Not on file    Forced sexual activity: Not on file  Other Topics Concern   Not on file  Social History Narrative   Recently moved from [place] in 10/2010 due to wanting a change from stressors.    Previously followed by Dr. Wynelle Bourgeois at [place] History  Problem Relation Age of Onset   Diabetes Mother    Stroke Mother 39   Diabetes Brother    Diabetes Brother    Cancer Brother 77       unknown type   Hypertension Brother       Review of Systems: All other systems reviewed and are otherwise negative except as noted above.  Physical Exam: Vitals:   08/29/18 2009 08/30/18 0050 08/30/18 0439 08/30/18 0700  BP: (!) 151/88 (!) 151/93 (!) 165/98 (!) 153/88  Pulse: 88 80 85 71  Resp: '18 18 17 17  ' Temp: 98.3 F (36.8 C) 98.4 F (36.9 C) 98 F (36.7 C) 98.9 F (37.2 C)  TempSrc: Oral Oral Oral Oral  SpO2: 99% 95% 99% 99%  Weight:      Height:        GEN- The patient is well appearing, alert and oriented x 3 today.   Head- normocephalic, atraumatic Eyes-  Sclera clear, conjunctiva pink Ears- hearing intact Oropharynx- clear Neck- supple Lungs- CTA b/l, normal work of breathing Heart- RRR, no murmurs, rubs or gallops  GI- soft, NT, ND Extremities- no clubbing, cyanosis, or edema MS- no significant deformity or atrophy Skin- no rash or lesion Psych- euthymic mood, full affect   Labs:   Lab Results  Component Value Date   WBC 9.5 08/27/2018   HGB 14.6 08/27/2018   HCT 43.0 08/27/2018   MCV 86.9 08/27/2018   PLT 288 08/27/2018    Recent Labs  Lab 08/30/18 0527  NA 137  K 3.4*  CL 103  CO2 27  BUN 15  CREATININE 1.14*  CALCIUM 8.5*  GLUCOSE 164*   Lab Results  Component Value Date   TROPONINI <0.03 08/18/2014   Lab Results  Component Value Date   CHOL 194 08/28/2018   CHOL 174 08/19/2014   CHOL 189 05/13/2012   Lab Results  Component Value Date  HDL 33 (L) 08/28/2018   HDL 26 (L) 08/19/2014   HDL 30 (L) 05/13/2012   Lab Results  Component Value Date   LDLCALC 116 (H) 08/28/2018   LDLCALC 78 08/19/2014   LDLCALC 113 (H) 05/13/2012   Lab Results  Component Value Date   TRIG 224 (H) 08/28/2018   TRIG 348 (H)  08/19/2014   TRIG 231 (H) 05/13/2012   Lab Results  Component Value Date   CHOLHDL 5.9 08/28/2018   CHOLHDL 6.7 08/19/2014   CHOLHDL 6.3 05/13/2012   No results found for: LDLDIRECT  No results found for: DDIMER   Radiology/Studies:   Ct Angio Head W Or Wo Contrast Result Date: 08/28/2018 CLINICAL DATA:  Ataxia. Dizziness and vomiting. Right cerebellar infarction. Chronic right M1 occlusion. EXAM: CT ANGIOGRAPHY HEAD AND NECK TECHNIQUE: Multidetector CT imaging of the head and neck was performed using the standard protocol during bolus administration of intravenous contrast. Multiplanar CT image reconstructions and MIPs were obtained to evaluate the vascular anatomy. Carotid stenosis measurements (when applicable) are obtained utilizing NASCET criteria, using the distal internal carotid diameter as the denominator. CONTRAST:  24m OMNIPAQUE IOHEXOL 350 MG/ML SOLN COMPARISON:  MRI yesterday.  Previous CT angiography 08/19/2014 FINDINGS: CT HEAD FINDINGS Brain: Low-density in the right cerebellum corresponding to the acute infarction shown by MRI. No evidence of hemorrhage or mass effect. Old cortical and subcortical infarctions in the right frontal and parietal region. No mass, hydrocephalus or extra-axial collection. Vascular: No abnormal vascular finding by noncontrast CT. Skull: Negative Sinuses: Clear Orbits: Normal Review of the MIP images confirms the above findings CTA NECK FINDINGS Aortic arch: No aortic atherosclerosis, aneurysm or dissection. Common origin of the innominate artery and left common carotid artery. Mild atherosclerotic calcification of the proximal left subclavian artery without stenosis. Right carotid system: Common carotid artery shows some areas of intimal thickening but no stenosis greater than 20%. Carotid bifurcation is widely patent without soft or calcified plaque. Cervical ICA is widely patent through the cervical region to the skull base. Left carotid system: Common  carotid artery widely patent to the bifurcation. Carotid bifurcation and ICA bulb are patent without calcified or soft plaque. Cervical ICA is widely patent, though slightly smaller than the right. Vertebral arteries: Both vertebral artery origins are patent. Left vertebral artery is larger than the right. Both vertebral arteries are patent through the cervical region to the foramen magnum. Skeleton: Ordinary cervical spondylosis. Anterior bridging osteophytes at C5-6. Other neck: No mass or lymphadenopathy. Thyroid goiter as seen previously with multiple nodules, some with peripheral calcification. Upper chest: Emphysema and pulmonary scarring in the lung apices. Review of the MIP images confirms the above findings CTA HEAD FINDINGS Anterior circulation: Both internal carotid arteries are patent through the skull base. Carotid siphon regions show some calcification on the right but none on the left. Maximal stenosis in the right siphon region estimated at 30%. On the left, stenosis in the proximal siphon is more pronounced, estimated at 50%. This has worsened slightly since 2016. On the right, there is chronic occlusion of the M1 segment with distal vessels opacified by collaterals. The right anterior cerebral artery is patent. On the left, there is occlusion or near occlusion of the anterior and middle cerebral vessels developed over the last 2 years. Posterior circulation: Right vertebral artery is a narrow and irregular vessel which supplies PICA. Left vertebral artery is a small vessel that is patent to the basilar, with stenosis at the vertebrobasilar junction. More distal basilar  artery does show flow. Superior cerebellar and posterior cerebral arteries show flow. Posterior cerebral arteries receive there supply from the anterior circulation primarily. Small contribution on the right from the basilar tip. Venous sinuses: Patent and normal. Anatomic variants: None significant. Delayed phase: Not performed.  Review of the MIP images confirms the above findings IMPRESSION: Intracranial occlusive disease progressive over time. Worsened appearance of the distal right vertebral artery which is very narrow and irregular, giving some supply to a small right PICA. Left vertebral artery is becoming stenotic distally with stenosis at the vertebrobasilar junction. Basilar artery is narrow and irregular. 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 Right anterior cerebral artery remains patent. Right PCA takes fetal origin from the right carotid. Left carotid artery shows a 50% stenosis in the siphon and occlusion of both the anterior and middle cerebral vessels on the left. Left anterior cerebral artery receives it supply from a patent anterior communicating artery. Left middle cerebral artery receives it supply from collateral vessels and possibly a small amount of antegrade flow. 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 Electronically Signed   By: Nelson Chimes M.D.   On: 08/28/2018 13:11     Mr Brain Wo Contrast Result Date: 08/27/2018 CLINICAL DATA:  Ataxia, stroke suspected.  Dizziness and vomiting. EXAM: MRI HEAD WITHOUT CONTRAST TECHNIQUE: Multiplanar, multiecho pulse sequences of the brain and surrounding structures were obtained without intravenous contrast. COMPARISON:  Head MRI 08/18/2014, head MRA 08/19/2014, and head and neck CTA 08/19/2014 FINDINGS: Brain: There is patchy restricted diffusion inferiorly and posteriorly in the right cerebellar hemisphere consistent with an acute, moderate-sized PICA territory infarct. There is subtle volume loss posterolaterally in the medulla on the right corresponding to the location of the acute infarct on the prior MRI. A small chronic cortical infarct in the right frontal lobe is unchanged. T2 hyperintensities in the cerebral white matter bilaterally are nonspecific but compatible with mild chronic small vessel ischemic disease, slightly progressed from  the prior MRI. There is wallerian degeneration along the left corticospinal tract extending from the corona radiata to the cerebellar peduncle. No intracranial hemorrhage, mass, midline shift, or extra-axial fluid collection is identified. The ventricles and sulci are within normal limits for age. 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 Vascular: Absent distal right V4 segment flow void consistent with previously demonstrated occlusion. Skull and upper cervical spine: Unremarkable bone marrow signal. Sinuses/Orbits: Unremarkable orbits. Small right maxillary sinus mucous retention cyst. Clear mastoid air cells. Other: None. IMPRESSION: 1. Moderate-sized acute right cerebellar infarct. 2. 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 Electronically Signed   By: Logan Bores M.D.   On: 08/27/2018 16:47    12-lead ECG SR All prior EKG's in EPIC reviewed with no documented atrial fibrillation  Telemetry SR  Assessment and Plan:  1. Cryptogenic stroke The patient presents with cryptogenic stroke.  The patient has a TEE planned for this AM.  I spoke at length with the patient about monitoring for afib with either a 30 day event monitor or an implantable loop recorder.  Risks, benefits, and alteratives to implantable loop recorder were discussed with the patient today.   At this time, the patient is very clear in her decision to proceed with implantable loop recorder, pending TEE, LE venous US and TCD.   Wound care was reviewed with the patient (keep incision clean and dry for 3 days).  Wound check will be scheduled for the patient  Please call with questions.  Renee Dyane Dustman, PA-C 08/30/2018  EP Attending  Patient seen and examined. Agree with the findings as noted above. She has had a  Cryptogenic stroke. I have reviewed the indications/risks/benefits/goals and expectations of ILR insertion and she wishes to proceed.   Mikle Bosworth.D

## 2018-08-30 NOTE — Progress Notes (Signed)
Patient mobility encouraged and patient educated on the importance of ambulating, per physical therapy, but patient insists on using purewick. Patient states she is tired and wants to rest. Will pass on to next shift.

## 2018-08-30 NOTE — Progress Notes (Signed)
    [facility] has been requested to perform a transesophageal echocardiogram on [name] for Stroke.  After careful review of history and examination, the risks and benefits of transesophageal echocardiogram have been explained including risks of esophageal damage, perforation (1:10,000 risk), bleeding, pharyngeal hematoma as well as other potential complications associated with conscious sedation including aspiration, arrhythmia, respiratory failure and death. Alternatives to treatment were discussed, questions were answered. Patient is willing to proceed.  TEE - Dr/ Nahser today round noon. Keep NPO. Meds with sips.   Leanor Kail, PA-C 08/30/2018 8:57 AM

## 2018-08-30 NOTE — Progress Notes (Signed)
STROKE TEAM PROGRESS NOTE      SUBJECTIVE (INTERVAL HISTORY)    She states she is feeling better today.  She is less dizzy.  TEE and loop recorder are pending for today.  She is likely going to be discharged after that.  ANA is positive but dsDNA is negative and she has no active symptoms of lupus.  ESR is only 13 mm.  Lower extremity venous Dopplers were negative for DVT. OBJECTIVE Vitals:   08/30/18 1317 08/30/18 1327 08/30/18 1330 08/30/18 1340  BP: 113/72 129/72 129/72 128/73  Pulse: 69 66 67 65  Resp: 16 20 (!) 22 19  Temp: 98.8 F (37.1 C)     TempSrc: Oral     SpO2: 100% 100% 100% 100%  Weight:      Height:        CBC:  Recent Labs  Lab 08/27/18 1012 08/27/18 1035  WBC 9.5  --   HGB 13.7 14.6  HCT 42.5 43.0  MCV 86.9  --   PLT 288  --     Basic Metabolic Panel:  Recent Labs  Lab 08/29/18 0508 08/30/18 0527  NA 138 137  K 3.7 3.4*  CL 104 103  CO2 27 27  GLUCOSE 192* 164*  BUN 16 15  CREATININE 1.15* 1.14*  CALCIUM 8.9 8.5*    Lipid Panel:     Component Value Date/Time   CHOL 194 08/28/2018 0336   TRIG 224 (H) 08/28/2018 0336   HDL 33 (L) 08/28/2018 0336   CHOLHDL 5.9 08/28/2018 0336   VLDL 45 (H) 08/28/2018 0336   LDLCALC 116 (H) 08/28/2018 0336   HgbA1c:  Lab Results  Component Value Date   HGBA1C 10.6 (H) 08/28/2018   Urine Drug Screen:     Component Value Date/Time   LABOPIA NONE DETECTED 08/28/2018 1143   COCAINSCRNUR NONE DETECTED 08/28/2018 1143   LABBENZ NONE DETECTED 08/28/2018 1143   AMPHETMU NONE DETECTED 08/28/2018 1143   THCU NONE DETECTED 08/28/2018 1143   LABBARB NONE DETECTED 08/28/2018 1143    Alcohol Level No results found for: ETH  IMAGING  Dg Chest 2 View 08/27/2018 IMPRESSION:  Borderline heart size with mild vascular congestion and interstitial prominence which could reflect interstitial edema or chronic lung disease.  MRI Brain Wo Contrast 08/27/2018 IMPRESSION:  1. Moderate-sized acute right cerebellar  infarct.  2. 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    Transthoracic Echocardiogram  08/28/2018 IMPRESSIONS  1. The left ventricle has normal systolic function with an ejection fraction of 60-65%. The cavity size was normal. There is mildly increased left ventricular wall thickness. Left ventricular diastolic Doppler parameters are consistent with impaired relaxation. Indeterminate filling pressures The E/e' is 8-15. No evidence of left ventricular regional wall motion abnormalities.  2. The right ventricle has normal systolic function. The cavity was normal. There is no increase in right ventricular wall thickness.  3. The mitral valve is abnormal. Mild thickening of the mitral valve leaflet.  4. The tricuspid valve is grossly normal.  5. The aortic valve is tricuspid. Mild sclerosis of the aortic valve. No stenosis of the aortic valve.  6. The ascending aorta and aortic root are normal in size and structure.   Bilateral Carotid Dopplers  00/00/2020 not ordered   EKG - SR rate 81 BPM. (See cardiology reading for complete details)    PHYSICAL EXAM Blood pressure 128/73, pulse 65, temperature 98.8 F (37.1 C), temperature source Oral, resp. rate 19, height 5'   8" (1.727 m), weight 119.7 kg, last menstrual period 03/30/2016, SpO2 100 %. Pleasant obese middle-aged African-American lady currently not in distress. . Afebrile. Head is nontraumatic. Neck is supple without bruit.    Cardiac exam no murmur or gallop. Lungs are clear to auscultation. Distal pulses are well felt.  Neurological Exam ;  Awake  Alert oriented x 3. Normal speech and language.eye movements full without nystagmus.fundi were not visualized. Vision acuity and fields appear normal. Hearing is normal. Palatal movements are normal. Face symmetric. Tongue midline. Normal strength, tone, reflexes and coordination. Normal sensation. Gait deferred.          ASSESSMENT/PLAN Ms. JERINE SURLES  is a 53 y.o. female with history of multiple strokes with no residual deficits, diabetes, hypertension, hyperlipidemia, headaches, and chronic back pain, presenting with sudden onset of dizziness and multiple episodes of vomiting this morning.. She did not receive IV t-PA due to late presentation (>4.5 hours from time of onset).   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   MRA head  - not ordered  CTA H&N  - not ordered  Carotid Doppler - not ordered  2D Echo  - EF 60 - 65%. No cardiac source of emboli identified.   Sars Corona Virus 2 - negative  LDL - 116  HgbA1c - 10.6  UDS - not performed  VTE prophylaxis - Lovenox  Diet  - Carb modified with thin liquids  aspirin 81 mg daily and clopidogrel 75 mg daily prior to admission, now on aspirin 81 mg daily and clopidogrel 75 mg daily  Patient counseled to be compliant with her antithrombotic medications  Ongoing aggressive stroke risk factor management  Therapy recommendations:  pending  Disposition:  Pending  Hypertension  Stable . Permissive hypertension (OK if < 220/120) but gradually normalize in 5-7 days . Long-term BP goal normotensive  Hyperlipidemia  Lipid lowering medication PTA:  Pravastatin 40 mg daily  LDL 116, goal < 70  Current lipid lowering medication: Change to Lipitor 40 mg daily  Continue statin at discharge  Diabetes  HgbA1c 10.6, goal < 7.0  Uncontrolled  Other Stroke Risk Factors  Former cigarette smoker - quit  Obesity, Body mass index is 40.12 kg/m., recommend weight loss, diet and exercise as appropriate   Hx stroke/TIA  Other Active Problems  Latex allergy  Cre - 1.29  PLAN  .  Check TEE and loop recorder and likely discharge home after that. Hospital day #  3 Recommend check TEE and loop recorder and likely discharge home after that.  Follow-up as an outpatient in the stroke clinic in 6 weeks.  Stroke team will sign off..  Discussed with Dr. Cameron Sprang, [place] Pager: [phone] 08/30/2018 3:46 PM   To contact Stroke Continuity provider, please refer to [email or web address] After hours, contact General Neurology

## 2018-08-30 NOTE — Progress Notes (Signed)
SLP Cancellation Note  Patient Details Name: PATCHES MCDONNELL MRN: [id number] DOB: [birth year]   Cancelled treatment:       Reason Eval/Treat Not Completed: Other (comment);Patient at procedure or test/unavailable(pt off the floor, ? if at TEE at this time, will continue efforts)  Luanna Salk, [place] [facility] SLP Acute Rehab Services Pager [phone] Office [phone]  Macario Golds 08/30/2018, 12:40 PM

## 2018-08-30 NOTE — Discharge Summary (Signed)
Physician Discharge Summary  Michelle Brooks AST:[id number] DOB: Sep 14, 1965 DOA: 08/27/2018  PCP: Nolene Ebbs, MD  Admit date: 08/27/2018 Discharge date: 08/30/2018  Admitted From: Home Disposition:  Home  Discharge Condition:Stable CODE STATUS:FULL Diet recommendation: Heart Healthy   Brief/Interim Summary: Patient is a 53 year old female with history of diabetes type 2, hypertension, recurrent strokes, hyperlipidemia who presented with 1 day history of dizziness, nausea and vomiting.  MRI showed moderate-sized acute right cerebellar infarct.  Neurologywas following.  Stroke work-up completed.  She underwent TEE and loop regular placement today.  She is hemodynamically stable for discharge to home today.  Home health has been arranged.  She should follow-up with neurology as an outpatient.  Following problems were addressed during hospitalization:  Acute ischemic stroke: MRI showed moderate sized acute right cerebellar infarct.  Patient presented with dizziness, nausea and vomiting.  Currently she does not have any focal neurological deficits.  Patient has history of multiple ischemic strokes and this is the fourth time. She is on aspirin and Plavix at home.  Neurology following. PT/OT consultation done and recommended home health PT. Echocardiogram showed ejection fraction of 60 to 60%, no source of embolus, impaired left ventricular relaxation. CT angio head and neck showed Intracranial occlusive disease progressive over time.Vasculitis suspected, but most of the vasculitis panel was found to be negative. Negative lower extremity venous duplex. Underwent  TEE and loop recorder for the work-up of cryptogenic stroke. Follow up with neurology as an outpatient.  Dizziness:  Dizziness has improved.  Continue meclizine as needed  Diabetes type 2: Pretty uncontrolled.  Hemoglobin A1c in the range of 10.  On insulin, antihyperglycemics at home. Being followed by diabetic coordinator consult.   Continue home regimen.  Follow-up closely with PCP.  Hypertension: Continue home regimen.  Hyperlipidemia: Continue lipitor.LDL of 112   Discharge Diagnoses:  Active Problems:   Type 2 diabetes mellitus with hyperlipidemia (HCC)   CVA (cerebral vascular accident) [facility])    Discharge Instructions  Discharge Instructions    Ambulatory referral to Neurology   Complete by: As directed    An appointment is requested in approximately: 4 weeks   Diet - low sodium heart healthy   Complete by: As directed    Discharge instructions   Complete by: As directed    1)Please continue your  home medications. 2)Follow up with your PCP in a week. 3)Follow up with neurology in 4 weeks.  Name and number the provider group has been attached. 4)Please monitor your blood glucose at home.Check Hemoglobin A 1C in 3 months.   Increase activity slowly   Complete by: As directed      Allergies as of 08/30/2018      Reactions   Lactose Intolerance (gi) Diarrhea, Nausea And Vomiting   Latex    Like a burning in area      Medication List    STOP taking these medications   pravastatin 40 MG tablet Commonly known as: PRAVACHOL   valACYclovir 1000 MG tablet Commonly known as: VALTREX     TAKE these medications   Accu-Chek Aviva Plus w/Device Kit 1 kit by Does not apply route 3 (three) times daily before meals.   True Metrix Meter Devi 1 each by Does not apply route 4 (four) times daily -  before meals and at bedtime.   acyclovir 400 MG tablet Commonly known as: ZOVIRAX Take 1 tablet (400 mg total) by mouth 3 (three) times daily. What changed: additional instructions   aspirin 81 MG  tablet Take 81 mg by mouth daily.   atorvastatin 40 MG tablet Commonly known as: LIPITOR Take 1 tablet (40 mg total) by mouth daily at 6 PM.   clopidogrel 75 MG tablet Commonly known as: PLAVIX Take 75 mg by mouth daily.   diclofenac sodium 1 % Gel Commonly known as: VOLTAREN Apply 2 g topically 4  (four) times daily as needed for pain.   fluconazole 150 MG tablet Commonly known as: Diflucan Take 1 tablet (150 mg total) by mouth daily.   gabapentin 300 MG capsule Commonly known as: NEURONTIN Take 300 mg by mouth 3 (three) times daily.   glucose blood test strip Commonly known as: True Metrix Blood Glucose Test Use as instructed   insulin lispro protamine-lispro (50-50) 100 UNIT/ML Susp injection Commonly known as: HUMALOG 50/50 MIX Inject 80 Units into the skin 3 (three) times daily. Up to 60 depending on cbg   Januvia 100 MG tablet Generic drug: sitaGLIPtin Take 100 mg by mouth daily.   lisinopril-hydrochlorothiazide 20-12.5 MG tablet Commonly known as: ZESTORETIC Take 1 tablet by mouth daily.   meclizine 25 MG tablet Commonly known as: ANTIVERT Take 1 tablet (25 mg total) by mouth 3 (three) times daily as needed for dizziness.   metFORMIN 500 MG tablet Commonly known as: GLUCOPHAGE Take 500 mg by mouth 2 (two) times daily with a meal.   nystatin-triamcinolone ointment Commonly known as: MYCOLOG Apply 1 application topically 2 (two) times daily.   ondansetron 4 MG disintegrating tablet Commonly known as: Zofran ODT Take 1 tablet (4 mg total) by mouth every 8 (eight) hours as needed for nausea or vomiting.   topiramate 50 MG tablet Commonly known as: TOPAMAX Take 1 tablet (50 mg total) by mouth 2 (two) times daily. Start 1 tablet daily x 1 week then twice daily   TRUEplus Lancets 28G Misc 1 each by Does not apply route 4 (four) times daily -  before meals and at bedtime.      Follow-up Information    Nolene Ebbs, MD. Schedule an appointment as soon as possible for a visit in 3 days.   Specialty: Internal Medicine Why: if not feeling better Contact information: [place] [address] [place].   Specialty: Emergency Medicine Why: If symptoms worsen Contact  information: [address] [id number] [place] [phone]       [facility]. Schedule an appointment as soon as possible for a visit in 4 week(s).   Specialty: Neurology Contact information: [place] [phone]         Allergies  Allergen Reactions  . Lactose Intolerance (Gi) Diarrhea and Nausea And Vomiting  . Latex     Like a burning in area    Consultations:  neurology   Procedures/Studies: Ct Angio Head W Or Wo Contrast  Result Date: 08/28/2018 CLINICAL DATA:  Ataxia. Dizziness and vomiting. Right cerebellar infarction. Chronic right M1 occlusion. EXAM: CT ANGIOGRAPHY HEAD AND NECK TECHNIQUE: Multidetector CT imaging of the head and neck was performed using the standard protocol during bolus administration of intravenous contrast. Multiplanar CT image reconstructions and MIPs were obtained to evaluate the vascular anatomy. Carotid stenosis measurements (when applicable) are obtained utilizing NASCET criteria, using the distal internal carotid diameter as the denominator. CONTRAST:  34m OMNIPAQUE IOHEXOL 350 MG/ML SOLN COMPARISON:  MRI yesterday.  Previous CT angiography 08/19/2014 FINDINGS:  CT HEAD FINDINGS Brain: Low-density in the right cerebellum corresponding to the acute infarction shown by MRI. No evidence of hemorrhage or mass effect. Old cortical and subcortical infarctions in the right frontal and parietal region. No mass, hydrocephalus or extra-axial collection. Vascular: No abnormal vascular finding by noncontrast CT. Skull: Negative Sinuses: Clear Orbits: Normal Review of the MIP images confirms the above findings CTA NECK FINDINGS Aortic arch: No aortic atherosclerosis, aneurysm or dissection. Common origin of the innominate artery and left common carotid artery. Mild atherosclerotic calcification of the proximal left subclavian artery without stenosis. Right  carotid system: Common carotid artery shows some areas of intimal thickening but no stenosis greater than 20%. Carotid bifurcation is widely patent without soft or calcified plaque. Cervical ICA is widely patent through the cervical region to the skull base. Left carotid system: Common carotid artery widely patent to the bifurcation. Carotid bifurcation and ICA bulb are patent without calcified or soft plaque. Cervical ICA is widely patent, though slightly smaller than the right. Vertebral arteries: Both vertebral artery origins are patent. Left vertebral artery is larger than the right. Both vertebral arteries are patent through the cervical region to the foramen magnum. Skeleton: Ordinary cervical spondylosis. Anterior bridging osteophytes at C5-6. Other neck: No mass or lymphadenopathy. Thyroid goiter as seen previously with multiple nodules, some with peripheral calcification. Upper chest: Emphysema and pulmonary scarring in the lung apices. Review of the MIP images confirms the above findings CTA HEAD FINDINGS Anterior circulation: Both internal carotid arteries are patent through the skull base. Carotid siphon regions show some calcification on the right but none on the left. Maximal stenosis in the right siphon region estimated at 30%. On the left, stenosis in the proximal siphon is more pronounced, estimated at 50%. This has worsened slightly since 2016. On the right, there is chronic occlusion of the M1 segment with distal vessels opacified by collaterals. The right anterior cerebral artery is patent. On the left, there is occlusion or near occlusion of the anterior and middle cerebral vessels developed over the last 2 years. Posterior circulation: Right vertebral artery is a narrow and irregular vessel which supplies PICA. Left vertebral artery is a small vessel that is patent to the basilar, with stenosis at the vertebrobasilar junction. More distal basilar artery does show flow. Superior cerebellar and  posterior cerebral arteries show flow. Posterior cerebral arteries receive there supply from the anterior circulation primarily. Small contribution on the right from the basilar tip. Venous sinuses: Patent and normal. Anatomic variants: None significant. Delayed phase: Not performed. Review of the MIP images confirms the above findings IMPRESSION: Intracranial occlusive disease progressive over time. Worsened appearance of the distal right vertebral artery which is very narrow and irregular, giving some supply to a small right PICA. Left vertebral artery is becoming stenotic distally with stenosis at the vertebrobasilar junction. Basilar artery is narrow and irregular. 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 Right anterior cerebral artery remains patent. Right PCA takes fetal origin from the right carotid. Left carotid artery shows a 50% stenosis in the siphon and occlusion of both the anterior and middle cerebral vessels on the left. Left anterior cerebral artery receives it supply from a patent anterior communicating artery. Left middle cerebral artery receives it supply from collateral vessels and possibly a small amount of antegrade flow. 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 Electronically Signed   By: Nelson Chimes M.D.   On: 08/28/2018 13:11   Dg Chest 2 View  Result Date:  08/27/2018 CLINICAL DATA:  CVA EXAM: CHEST - 2 VIEW COMPARISON:  None. FINDINGS: Heart is borderline in size. Diffuse interstitial prominence throughout the lungs. Mild vascular congestion. No effusions or acute bony abnormality. IMPRESSION: Borderline heart size with mild vascular congestion and interstitial prominence which could reflect interstitial edema or chronic lung disease. Electronically Signed   By: Rolm Baptise M.D.   On: 08/27/2018 19:49   Ct Angio Neck W Or Wo Contrast  Result Date: 08/28/2018 CLINICAL DATA:  Ataxia. Dizziness and vomiting. Right cerebellar infarction. Chronic right M1  occlusion. EXAM: CT ANGIOGRAPHY HEAD AND NECK TECHNIQUE: Multidetector CT imaging of the head and neck was performed using the standard protocol during bolus administration of intravenous contrast. Multiplanar CT image reconstructions and MIPs were obtained to evaluate the vascular anatomy. Carotid stenosis measurements (when applicable) are obtained utilizing NASCET criteria, using the distal internal carotid diameter as the denominator. CONTRAST:  45m OMNIPAQUE IOHEXOL 350 MG/ML SOLN COMPARISON:  MRI yesterday.  Previous CT angiography 08/19/2014 FINDINGS: CT HEAD FINDINGS Brain: Low-density in the right cerebellum corresponding to the acute infarction shown by MRI. No evidence of hemorrhage or mass effect. Old cortical and subcortical infarctions in the right frontal and parietal region. No mass, hydrocephalus or extra-axial collection. Vascular: No abnormal vascular finding by noncontrast CT. Skull: Negative Sinuses: Clear Orbits: Normal Review of the MIP images confirms the above findings CTA NECK FINDINGS Aortic arch: No aortic atherosclerosis, aneurysm or dissection. Common origin of the innominate artery and left common carotid artery. Mild atherosclerotic calcification of the proximal left subclavian artery without stenosis. Right carotid system: Common carotid artery shows some areas of intimal thickening but no stenosis greater than 20%. Carotid bifurcation is widely patent without soft or calcified plaque. Cervical ICA is widely patent through the cervical region to the skull base. Left carotid system: Common carotid artery widely patent to the bifurcation. Carotid bifurcation and ICA bulb are patent without calcified or soft plaque. Cervical ICA is widely patent, though slightly smaller than the right. Vertebral arteries: Both vertebral artery origins are patent. Left vertebral artery is larger than the right. Both vertebral arteries are patent through the cervical region to the foramen magnum.  Skeleton: Ordinary cervical spondylosis. Anterior bridging osteophytes at C5-6. Other neck: No mass or lymphadenopathy. Thyroid goiter as seen previously with multiple nodules, some with peripheral calcification. Upper chest: Emphysema and pulmonary scarring in the lung apices. Review of the MIP images confirms the above findings CTA HEAD FINDINGS Anterior circulation: Both internal carotid arteries are patent through the skull base. Carotid siphon regions show some calcification on the right but none on the left. Maximal stenosis in the right siphon region estimated at 30%. On the left, stenosis in the proximal siphon is more pronounced, estimated at 50%. This has worsened slightly since 2016. On the right, there is chronic occlusion of the M1 segment with distal vessels opacified by collaterals. The right anterior cerebral artery is patent. On the left, there is occlusion or near occlusion of the anterior and middle cerebral vessels developed over the last 2 years. Posterior circulation: Right vertebral artery is a narrow and irregular vessel which supplies PICA. Left vertebral artery is a small vessel that is patent to the basilar, with stenosis at the vertebrobasilar junction. More distal basilar artery does show flow. Superior cerebellar and posterior cerebral arteries show flow. Posterior cerebral arteries receive there supply from the anterior circulation primarily. Small contribution on the right from the basilar tip. Venous sinuses: Patent and normal.  Anatomic variants: None significant. Delayed phase: Not performed. Review of the MIP images confirms the above findings IMPRESSION: Intracranial occlusive disease progressive over time. Worsened appearance of the distal right vertebral artery which is very narrow and irregular, giving some supply to a small right PICA. Left vertebral artery is becoming stenotic distally with stenosis at the vertebrobasilar junction. Basilar artery is narrow and irregular.  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 Right anterior cerebral artery remains patent. Right PCA takes fetal origin from the right carotid. Left carotid artery shows a 50% stenosis in the siphon and occlusion of both the anterior and middle cerebral vessels on the left. Left anterior cerebral artery receives it supply from a patent anterior communicating artery. Left middle cerebral artery receives it supply from collateral vessels and possibly a small amount of antegrade flow. 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 Electronically Signed   By: Nelson Chimes M.D.   On: 08/28/2018 13:11   Mr Brain Wo Contrast  Result Date: 08/27/2018 CLINICAL DATA:  Ataxia, stroke suspected.  Dizziness and vomiting. EXAM: MRI HEAD WITHOUT CONTRAST TECHNIQUE: Multiplanar, multiecho pulse sequences of the brain and surrounding structures were obtained without intravenous contrast. COMPARISON:  Head MRI 08/18/2014, head MRA 08/19/2014, and head and neck CTA 08/19/2014 FINDINGS: Brain: There is patchy restricted diffusion inferiorly and posteriorly in the right cerebellar hemisphere consistent with an acute, moderate-sized PICA territory infarct. There is subtle volume loss posterolaterally in the medulla on the right corresponding to the location of the acute infarct on the prior MRI. A small chronic cortical infarct in the right frontal lobe is unchanged. T2 hyperintensities in the cerebral white matter bilaterally are nonspecific but compatible with mild chronic small vessel ischemic disease, slightly progressed from the prior MRI. There is wallerian degeneration along the left corticospinal tract extending from the corona radiata to the cerebellar peduncle. No intracranial hemorrhage, mass, midline shift, or extra-axial fluid collection is identified. The ventricles and sulci are within normal limits for age. 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 Vascular: Absent distal right V4 segment flow void  consistent with previously demonstrated occlusion. Skull and upper cervical spine: Unremarkable bone marrow signal. Sinuses/Orbits: Unremarkable orbits. Small right maxillary sinus mucous retention cyst. Clear mastoid air cells. Other: None. IMPRESSION: 1. Moderate-sized acute right cerebellar infarct. 2. 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 Electronically Signed   By: Logan Bores M.D.   On: 08/27/2018 16:47   Vas [place] Lower Extremity Venous (dvt)  Result Date: 08/30/2018  Lower Venous Study Indications: Stroke.  Risk Factors: None identified. Comparison Study: No prior studies. Performing Technologist: Oliver Hum RVT  Examination Guidelines: A complete evaluation includes B-mode imaging, spectral Doppler, color Doppler, and power Doppler as needed of all accessible portions of each vessel. Bilateral testing is considered an integral part of a complete examination. Limited examinations for reoccurring indications may be performed as noted.  +---------+---------------+---------+-----------+----------+-------+ RIGHT    CompressibilityPhasicitySpontaneityPropertiesSummary +---------+---------------+---------+-----------+----------+-------+ CFV      Full           Yes      Yes                          +---------+---------------+---------+-----------+----------+-------+ SFJ      Full                                                 +---------+---------------+---------+-----------+----------+-------+  FV Prox  Full                                                 +---------+---------------+---------+-----------+----------+-------+ FV Mid   Full                                                 +---------+---------------+---------+-----------+----------+-------+ FV DistalFull                                                 +---------+---------------+---------+-----------+----------+-------+ PFV      Full                                                  +---------+---------------+---------+-----------+----------+-------+ POP      Full           Yes      Yes                          +---------+---------------+---------+-----------+----------+-------+ PTV      Full                                                 +---------+---------------+---------+-----------+----------+-------+ PERO     Full                                                 +---------+---------------+---------+-----------+----------+-------+   +---------+---------------+---------+-----------+----------+-------+ LEFT     CompressibilityPhasicitySpontaneityPropertiesSummary +---------+---------------+---------+-----------+----------+-------+ CFV      Full           Yes      Yes                          +---------+---------------+---------+-----------+----------+-------+ SFJ      Full                                                 +---------+---------------+---------+-----------+----------+-------+ FV Prox  Full                                                 +---------+---------------+---------+-----------+----------+-------+ FV Mid   Full                                                 +---------+---------------+---------+-----------+----------+-------+ FV  DistalFull                                                 +---------+---------------+---------+-----------+----------+-------+ PFV      Full                                                 +---------+---------------+---------+-----------+----------+-------+ POP      Full           Yes      Yes                          +---------+---------------+---------+-----------+----------+-------+ PTV      Full                                                 +---------+---------------+---------+-----------+----------+-------+ PERO     Full                                                 +---------+---------------+---------+-----------+----------+-------+      Summary: Right: There is no evidence of deep vein thrombosis in the lower extremity. No cystic structure found in the popliteal fossa. Left: There is no evidence of deep vein thrombosis in the lower extremity. No cystic structure found in the popliteal fossa.  *See table(s) above for measurements and observations.    Preliminary       Subjective:  Patient seen and examined the bedside this morning.  Hemodynamically stable.  Stroke work-up completed.  Stable for discharge to home today.  Discharge Exam: Vitals:   08/30/18 1330 08/30/18 1340  BP: 129/72 128/73  Pulse: 67 65  Resp: (!) 22 19  Temp:    SpO2: 100% 100%   Vitals:   08/30/18 1317 08/30/18 1327 08/30/18 1330 08/30/18 1340  BP: 113/72 129/72 129/72 128/73  Pulse: 69 66 67 65  Resp: 16 20 (!) 22 19  Temp: 98.8 F (37.1 C)     TempSrc: Oral     SpO2: 100% 100% 100% 100%  Weight:      Height:        General: Pt is alert, awake, not in acute distress Cardiovascular: RRR, S1/S2 +, no rubs, no gallops Respiratory: CTA bilaterally, no wheezing, no rhonchi Abdominal: Soft, NT, ND, bowel sounds + Extremities: no edema, no cyanosis    The results of significant diagnostics from this hospitalization (including imaging, microbiology, ancillary and laboratory) are listed below for reference.     Microbiology: Recent Results (from the past 240 hour(s))  SARS Coronavirus 2 (CEPHEID - Performed in [place] hospital lab), Hosp Order     Status: None   Collection Time: 08/27/18  6:54 PM   Specimen: Nasopharyngeal Swab  Result Value Ref Range Status   SARS Coronavirus 2 NEGATIVE NEGATIVE Final    Comment: (NOTE) If result is NEGATIVE SARS-CoV-2 target nucleic acids are NOT DETECTED. The SARS-CoV-2 RNA is generally detectable in upper and lower  respiratory specimens during the acute phase of infection. The lowest  concentration of SARS-CoV-2 viral copies this assay can detect is 250  copies / mL. A negative result does  not preclude SARS-CoV-2 infection  and should not be used as the sole basis for treatment or other  patient management decisions.  A negative result may occur with  improper specimen collection / handling, submission of specimen other  than nasopharyngeal swab, presence of viral mutation(s) within the  areas targeted by this assay, and inadequate number of viral copies  (<250 copies / mL). A negative result must be combined with clinical  observations, patient history, and epidemiological information. If result is POSITIVE SARS-CoV-2 target nucleic acids are DETECTED. The SARS-CoV-2 RNA is generally detectable in upper and lower  respiratory specimens dur ing the acute phase of infection.  Positive  results are indicative of active infection with SARS-CoV-2.  Clinical  correlation with patient history and other diagnostic information is  necessary to determine patient infection status.  Positive results do  not rule out bacterial infection or co-infection with other viruses. If result is PRESUMPTIVE POSTIVE SARS-CoV-2 nucleic acids MAY BE PRESENT.   A presumptive positive result was obtained on the submitted specimen  and confirmed on repeat testing.  While 2019 novel coronavirus  (SARS-CoV-2) nucleic acids may be present in the submitted sample  additional confirmatory testing may be necessary for epidemiological  and / or clinical management purposes  to differentiate between  SARS-CoV-2 and other Sarbecovirus currently known to infect humans.  If clinically indicated additional testing with an alternate test  methodology [phone]) is advised. The SARS-CoV-2 RNA is generally  detectable in upper and lower respiratory sp ecimens during the acute  phase of infection. The expected result is Negative. Fact Sheet for Patients:  StrictlyIdeas.no Fact Sheet for Healthcare Providers: BankingDealers.co.za This test is not yet approved or  cleared by the [place] FDA and has been authorized for detection and/or diagnosis of SARS-CoV-2 by FDA under an Emergency Use Authorization (EUA).  This EUA will remain in effect (meaning this test can be used) for the duration of the COVID-19 declaration under Section 564(b)(1) of the Act, 21 U.S.C. section 360bbb-3(b)(1), unless the authorization is terminated or revoked sooner. Performed at [facility], [place] [address], [place], [place] [address]      Labs: BNP (last 3 results) No results for input(s): BNP in the last 8760 hours. Basic Metabolic Panel: Recent Labs  Lab 08/27/18 1012 08/27/18 1035 08/29/18 0508 08/30/18 0527  NA 136 138 138 137  K 4.2 4.2 3.7 3.4*  CL 100  --  104 103  CO2 24  --  27 27  GLUCOSE 328*  --  192* 164*  BUN 16  --  16 15  CREATININE 1.29*  --  1.15* 1.14*  CALCIUM 10.0  --  8.9 8.5*   Liver Function Tests: No results for input(s): AST, ALT, ALKPHOS, BILITOT, PROT, ALBUMIN in the last 168 hours. No results for input(s): LIPASE, AMYLASE in the last 168 hours. No results for input(s): AMMONIA in the last 168 hours. CBC: Recent Labs  Lab 08/27/18 1012 08/27/18 1035  WBC 9.5  --   HGB 13.7 14.6  HCT 42.5 43.0  MCV 86.9  --   PLT 288  --    Cardiac Enzymes: No results for input(s): CKTOTAL, CKMB, CKMBINDEX, TROPONINI in the last 168 hours. BNP: Invalid input(s): POCBNP CBG: Recent Labs  Lab 08/29/18 2050 08/30/18 0612 08/30/18  7262 08/30/18 1230 08/30/18 1319  GLUCAP 105* 156* 156* 74 79   D-Dimer No results for input(s): DDIMER in the last 72 hours. Hgb A1c Recent Labs    08/28/18 0336  HGBA1C 10.6*   Lipid Profile Recent Labs    08/28/18 0336  CHOL 194  HDL 33*  LDLCALC 116*  TRIG 224*  CHOLHDL 5.9   Thyroid function studies No results for input(s): TSH, T4TOTAL, T3FREE, THYROIDAB in the last 72 hours.  Invalid input(s): FREET3 Anemia work up No results for input(s): VITAMINB12, FOLATE,  FERRITIN, TIBC, IRON, RETICCTPCT in the last 72 hours. Urinalysis    Component Value Date/Time   COLORURINE YELLOW 08/27/2018 1012   APPEARANCEUR CLEAR 08/27/2018 1012   LABSPEC 1.020 08/27/2018 1012   PHURINE 5.0 08/27/2018 1012   GLUCOSEU >=500 (A) 08/27/2018 1012   HGBUR NEGATIVE 08/27/2018 1012   BILIRUBINUR NEGATIVE 08/27/2018 1012   KETONESUR NEGATIVE 08/27/2018 1012   PROTEINUR NEGATIVE 08/27/2018 1012   UROBILINOGEN 0.2 08/18/2014 1235   NITRITE NEGATIVE 08/27/2018 1012   LEUKOCYTESUR NEGATIVE 08/27/2018 1012   Sepsis Labs Invalid input(s): PROCALCITONIN,  WBC,  LACTICIDVEN Microbiology Recent Results (from the past 240 hour(s))  SARS Coronavirus 2 (CEPHEID - Performed in [facility] lab), Hosp Order     Status: None   Collection Time: 08/27/18  6:54 PM   Specimen: Nasopharyngeal Swab  Result Value Ref Range Status   SARS Coronavirus 2 NEGATIVE NEGATIVE Final    Comment: (NOTE) If result is NEGATIVE SARS-CoV-2 target nucleic acids are NOT DETECTED. The SARS-CoV-2 RNA is generally detectable in upper and lower  respiratory specimens during the acute phase of infection. The lowest  concentration of SARS-CoV-2 viral copies this assay can detect is 250  copies / mL. A negative result does not preclude SARS-CoV-2 infection  and should not be used as the sole basis for treatment or other  patient management decisions.  A negative result may occur with  improper specimen collection / handling, submission of specimen other  than nasopharyngeal swab, presence of viral mutation(s) within the  areas targeted by this assay, and inadequate number of viral copies  (<250 copies / mL). A negative result must be combined with clinical  observations, patient history, and epidemiological information. If result is POSITIVE SARS-CoV-2 target nucleic acids are DETECTED. The SARS-CoV-2 RNA is generally detectable in upper and lower  respiratory specimens dur ing the acute phase  of infection.  Positive  results are indicative of active infection with SARS-CoV-2.  Clinical  correlation with patient history and other diagnostic information is  necessary to determine patient infection status.  Positive results do  not rule out bacterial infection or co-infection with other viruses. If result is PRESUMPTIVE POSTIVE SARS-CoV-2 nucleic acids MAY BE PRESENT.   A presumptive positive result was obtained on the submitted specimen  and confirmed on repeat testing.  While 2019 novel coronavirus  (SARS-CoV-2) nucleic acids may be present in the submitted sample  additional confirmatory testing may be necessary for epidemiological  and / or clinical management purposes  to differentiate between  SARS-CoV-2 and other Sarbecovirus currently known to infect humans.  If clinically indicated additional testing with an alternate test  methodology [phone]) is advised. The SARS-CoV-2 RNA is generally  detectable in upper and lower respiratory sp ecimens during the acute  phase of infection. The expected result is Negative. Fact Sheet for Patients:  StrictlyIdeas.no Fact Sheet for Healthcare Providers: BankingDealers.co.za This test is not yet approved or cleared  by the [place] and has been authorized for detection and/or diagnosis of SARS-CoV-2 by FDA under an Emergency Use Authorization (EUA).  This EUA will remain in effect (meaning this test can be used) for the duration of the COVID-19 declaration under Section 564(b)(1) of the Act, 21 U.S.C. section 360bbb-3(b)(1), unless the authorization is terminated or revoked sooner. Performed at [facility], [place] [address], [place], [place] [address]     Please note: You were cared for by a hospitalist during your hospital stay. Once you are discharged, your primary care physician will handle any further medical issues. Please note that NO REFILLS for any discharge  medications will be authorized once you are discharged, as it is imperative that you return to your primary care physician (or establish a relationship with a primary care physician if you do not have one) for your post hospital discharge needs so that they can reassess your need for medications and monitor your lab values.    Time coordinating discharge: 40 minutes  SIGNED:   Shelly Coss, MD  [facility] 08/30/2018, 3:19 PM Pager [phone]  If 7PM-7AM, please contact night-coverage [email or web address] Password [email or web address]

## 2018-08-30 NOTE — CV Procedure (Signed)
    Transesophageal Echocardiogram Note  Michelle Brooks [id number] September 11, 1965  Procedure: Transesophageal Echocardiogram Indications: CVA   Procedure Details Consent: Obtained Time Out: Verified patient identification, verified procedure, site/side was marked, verified correct patient position, special equipment/implants available, Radiology Safety Procedures followed,  medications/allergies/relevent history reviewed, required imaging and test results available.  Performed  Medications:  During this procedure the patient is administered a propofol drip - total of 210 mg of Propofol + Presidex 28 mcg IV .    The patient's heart rate, blood pressure, and oxygen saturation are monitored continuously during the procedure.   Left Ventrical:  Normal .   LVH.   The septum is bright   Mitral Valve: normal   Aortic Valve: normal   Tricuspid Valve: normal   Pulmonic Valve: normal   Left Atrium/ Left atrial appendage: no thrombi.   Atrial septum: with bubble contrast, several bubbles appeared in the left atrium after 4-6 cardiac cycles.   The opacification of the RA was excellent .   Its difficult to say if this is shunting from a small PFO or a pulnmonary AVM.    Aorta: mild plaque.    Complications: No apparent complications Patient did tolerate procedure well.   Thayer Headings, Brooke Bonito., MD, [facility] 08/30/2018, 1:18 PM

## 2018-08-30 NOTE — TOC Progression Note (Signed)
Transition of Care [facility]) - Progression Note    Patient Details  Name: VYLET MAFFIA MRN: [id number] Date of Birth: 1965-10-07  Transition of Care [facility]) CM/SW [place], [place] Phone Number: 08/30/2018, 12:16 PM  Clinical Narrative:     CSW called Butch Penny with [facility], she declined the patient, not being able to take her at this time.   CSW called Georgina Snell with [facility]. Georgina Snell is able to accept her. CSW stated that the patient will need PT, OT, and an Therapist, sports. CSW stated that the patient may be ready for discharge possibly today or early tomorrow. Georgina Snell will follow up with her. At this time the patient is not requiring any additional DME.   CSW will continue to follow.   Expected Discharge Plan: [place] Barriers to Discharge: Continued Medical Work up  Expected Discharge Plan and Services Expected Discharge Plan: [place] In-house Referral: Clinical Social Work Discharge Planning Services: CM Consult Post Acute Care Choice: NA Living arrangements for the past 2 months: Apartment Expected Discharge Date: 08/29/18               DME Arranged: N/A DME Agency: NA       HH Arranged: Social Work [employer] Agency: [place] Date [facility] Agency Contacted: 08/30/18 Time [place]: 1055 Representative spoke with at [place]: Beryle Beams   Social Determinants of Health (SDOH) Interventions    Readmission Risk Interventions No flowsheet data found.

## 2018-08-30 NOTE — Interval H&P Note (Signed)
History and Physical Interval Note:  08/30/2018 12:31 PM  Michelle Brooks  has presented today for surgery, with the diagnosis of Stroke.  The various methods of treatment have been discussed with the patient and family. After consideration of risks, benefits and other options for treatment, the patient has consented to  Procedure(s): TRANSESOPHAGEAL ECHOCARDIOGRAM (TEE) (N/A) as a surgical intervention.  The patient's history has been reviewed, patient examined, no change in status, stable for surgery.  I have reviewed the patient's chart and labs.  Questions were answered to the patient's satisfaction.     [name]

## 2018-08-30 NOTE — Anesthesia Postprocedure Evaluation (Signed)
Anesthesia Post Note  Patient: Michelle Brooks  Procedure(s) Performed: TRANSESOPHAGEAL ECHOCARDIOGRAM (TEE) (N/A ) BUBBLE STUDY     Patient location during evaluation: PACU Anesthesia Type: MAC Level of consciousness: awake and alert Pain management: pain level controlled Vital Signs Assessment: post-procedure vital signs reviewed and stable Respiratory status: spontaneous breathing, nonlabored ventilation, respiratory function stable and patient connected to nasal cannula oxygen Cardiovascular status: stable and blood pressure returned to baseline Postop Assessment: no apparent nausea or vomiting Anesthetic complications: no    Last Vitals:  Vitals:   08/30/18 1340 08/30/18 1557  BP: 128/73 (!) 147/98  Pulse: 65 80  Resp: 19 19  Temp:  36.7 C  SpO2: 100% 100%    Last Pain:  Vitals:   08/30/18 1557  TempSrc: Axillary  PainSc:                  Karyl Kinnier Hunter Pinkard

## 2018-08-30 NOTE — Care Management Important Message (Signed)
Important Message  Patient Details  Name: Michelle Brooks MRN: [id number] Date of Birth: [birth year]   Medicare Important Message Given:  Yes     Demarie Hyneman 08/30/2018, 1:16 PM

## 2018-08-30 NOTE — Progress Notes (Deleted)
CSW called Butch Penny with [facility], she declined the patient, not being able to take her at this time.   CSW called Georgina Snell with [facility]. Georgina Snell is able to accept her. CSW stated that the patient will need PT, OT, and an Therapist, sports. CSW stated that the patient may be ready for discharge possibly today or early tomorrow. Georgina Snell will follow up with her. At this time the patient is not requiring any additional DME.   CSW will continue to follow.   Domenic Schwab, MSW, [place] Worker [facility]  [phone]

## 2018-08-30 NOTE — Transfer of Care (Signed)
Immediate Anesthesia Transfer of Care Note  Patient: Michelle Brooks  Procedure(s) Performed: TRANSESOPHAGEAL ECHOCARDIOGRAM (TEE) (N/A ) BUBBLE STUDY  Patient Location: Endoscopy Unit  Anesthesia Type:MAC  Level of Consciousness: awake, alert  and oriented  Airway & Oxygen Therapy: Patient Spontanous Breathing and Patient connected to nasal cannula oxygen  Post-op Assessment: Report given to RN and Post -op Vital signs reviewed and stable  Post vital signs: Reviewed and stable  Last Vitals:  Vitals Value Taken Time  BP 113/72 08/30/18 1317  Temp 37.1 C 08/30/18 1317  Pulse 68 08/30/18 1323  Resp 25 08/30/18 1323  SpO2 100 % 08/30/18 1323  Vitals shown include unvalidated device data.  Last Pain:  Vitals:   08/30/18 1317  TempSrc: Oral  PainSc: 0-No pain         Complications: No apparent anesthesia complications

## 2018-08-30 NOTE — TOC Initial Note (Addendum)
Transition of Care [facility]) - Initial/Assessment Note    Patient Details  Name: Michelle Brooks MRN: [id number] Date of Birth: January 26, 1966  Transition of Care [facility]) CM/SW Contact:    Gelene Mink, [place] Phone Number: 08/30/2018, 10:06 AM  Clinical Narrative:                  CSW met with the patient at bedside. The patient was alert and orientated. The patient is agreeable to home health. She would like physical therapy and occupational therapy. She would like a nurse to come by and assist her with medications. CSW offered the patient choice, she chose [facility], [facility], [facility], or [facility].    She has a daughter that lives in [place] and a boyfriend that can help her. She has a rolling walker, standard walker, shower chair, scooter, and a cane. She has high toilet at home and can get to the toilet okay. Boyfriend and daughter assist with driving. The patient takes herself to the store and appointments if isn't to far from her home. The patient has a life alert button that she wears at home. The patient has a PCP and will follow up after her hospital stay.   CSW will coordinate home health services. The patient does not need any additional.   Expected Discharge Plan: [place] Barriers to Discharge: Continued Medical Work up   Patient Goals and CMS Choice Patient states their goals for this hospitalization and ongoing recovery are:: Patient wants to stop having strokes CMS Medicare.gov Compare Post Acute Care list provided to:: Patient Choice offered to / list presented to : Patient  Expected Discharge Plan and Services Expected Discharge Plan: [place] In-house Referral: Clinical Social Work Discharge Planning Services: CM Consult Post Acute Care Choice: NA Living arrangements for the past 2 months: Apartment Expected Discharge Date: 08/29/18               DME Arranged: N/A DME Agency: NA       HH Arranged: Social Work [employer] Agency:  (Preferences are [facility], Nurse, learning disability, [facility], [facility])        Prior Living Arrangements/Services Living arrangements for the past 2 months: Apartment Lives with:: Self Patient language and need for interpreter reviewed:: No Do you feel safe going back to the place where you live?: Yes      Need for Family Participation in Patient Care: No (Comment) Care giver support system in place?: Yes (comment) Current home services: DME(rolling walker, motor scooter, has a shower chair, standard walker, cane,) Criminal Activity/Legal Involvement Pertinent to Current Situation/Hospitalization: No - Comment as needed  Activities of Daily Living Home Assistive Devices/Equipment: None ADL Screening (condition at time of admission) Patient's cognitive ability adequate to safely complete daily activities?: Yes Is the patient deaf or have difficulty hearing?: No Does the patient have difficulty seeing, even when wearing glasses/contacts?: No Does the patient have difficulty concentrating, remembering, or making decisions?: No Patient able to express need for assistance with ADLs?: Yes Does the patient have difficulty dressing or bathing?: No Independently performs ADLs?: Yes (appropriate for developmental age) Does the patient have difficulty walking or climbing stairs?: No Weakness of Legs: Both Weakness of Arms/Hands: None  Permission Sought/Granted Permission sought to share information with : Case Manager Permission granted to share information with : Yes, Release of Information Signed     Permission granted to share info w AGENCY: [place]        Emotional Assessment  Appearance:: Appears stated age Attitude/Demeanor/Rapport: Engaged Affect (typically observed): Accepting Orientation: : Oriented to Self, Oriented to Place, Oriented to  Time, Oriented to Situation Alcohol / Substance Use: Not Applicable Psych Involvement: No (comment)  Admission diagnosis:  Dehydration  [E86.0] CVA (cerebral vascular accident) ([place]) [I63.9] Vomiting in adult [R11.10] Patient Active Problem List   Diagnosis Date Noted  . CVA (cerebral vascular accident) ([place]) 08/27/2018  . Dehydration   . Vomiting in adult   . Right hemiparesis ([place]) 10/05/2014  . Diabetic neuropathy ([place]) 09/15/2014  . GERD (gastroesophageal reflux disease) 09/15/2014  . Stroke ([place]) 08/18/2014  . CVA (cerebral infarction) 08/18/2014  . Hot flashes 12/03/2012  . High risk sexual behavior 05/13/2012  . Chronic low back pain 12/29/2011  . Obesity 10/23/2011  . Financial difficulties 10/23/2011  . Poor dentition 10/23/2011  . Paresthesia of left arm and leg 10/23/2011  . Irregular periods/menstrual cycles 09/12/2011  . Preventative health care 05/13/2011  . Dyslipidemia 04/01/2011  . Hypertension 03/27/2011  . History of stroke 03/13/2010  . Type 2 diabetes mellitus with hyperlipidemia ([place]) 02/11/2004   PCP:  Nolene Ebbs, MD Pharmacy:   [facility], [place] [address] [place] [address] Phone: [phone] Fax: [phone]  [facility] [place] - [address] [place]) [place] [address] Phone: [phone] Fax: [phone]     Social Determinants of Health (SDOH) Interventions    Readmission Risk Interventions No flowsheet data found.

## 2018-08-30 NOTE — Anesthesia Preprocedure Evaluation (Addendum)
Anesthesia Evaluation  Patient identified by MRN, date of birth, ID band Patient awake    Reviewed: Allergy & Precautions, NPO status , Patient's Chart, lab work & pertinent test results  Airway Mallampati: III  TM Distance: >3 FB Neck ROM: Full    Dental  (+) Edentulous Upper   Pulmonary former smoker,    Pulmonary exam normal breath sounds clear to auscultation       Cardiovascular hypertension, Pt. on medications Normal cardiovascular exam Rhythm:Regular Rate:Normal  ECG: SR, rate 81  ECHO: 1. The left ventricle has normal systolic function with an ejection fraction of 60-65%. The cavity size was normal. There is mildly increased left ventricular wall thickness. Left ventricular diastolic Doppler parameters are consistent with impaired  relaxation. Indeterminate filling pressures The E/e' is 8-15. No evidence of left ventricular regional wall motion abnormalities. 2. The right ventricle has normal systolic function. The cavity was normal. There is no increase in right ventricular wall thickness. 3. The mitral valve is abnormal. Mild thickening of the mitral valve leaflet. 4. The tricuspid valve is grossly normal. 5. The aortic valve is tricuspid. Mild sclerosis of the aortic valve. No stenosis of the aortic valve. 6. The ascending aorta and aortic root are normal in size and structure.   Neuro/Psych  Headaches, Diabetic retinopathy  CVA negative psych ROS   GI/Hepatic negative GI ROS, Neg liver ROS,   Endo/Other  diabetes, Insulin Dependent, Oral Hypoglycemic AgentsMorbid obesity  Renal/GU negative Renal ROS     Musculoskeletal Chronic back pain   Abdominal (+) + obese,   Peds  Hematology HLD   Anesthesia Other Findings Stroke  Reproductive/Obstetrics                            Anesthesia Physical Anesthesia Plan  ASA: III  Anesthesia Plan: MAC   Post-op Pain Management:     Induction:   PONV Risk Score and Plan: 2 and Propofol infusion and Treatment may vary due to age or medical condition  Airway Management Planned: Nasal Cannula  Additional Equipment:   Intra-op Plan:   Post-operative Plan:   Informed Consent: I have reviewed the patients History and Physical, chart, labs and discussed the procedure including the risks, benefits and alternatives for the proposed anesthesia with the patient or authorized representative who has indicated his/her understanding and acceptance.     Dental advisory given  Plan Discussed with: CRNA  Anesthesia Plan Comments:        Anesthesia Quick Evaluation

## 2018-08-30 NOTE — Progress Notes (Signed)
PT Cancellation Note  Patient Details Name: Michelle Brooks MRN: [id number] DOB: September 02, 1965   Cancelled Treatment:    Reason Eval/Treat Not Completed: Patient at procedure or test/unavailable. Will continue to follow.   Thelma Comp 08/30/2018, 1:07 PM   Rolinda Roan, PT, DPT Acute Rehabilitation Services Pager: [phone] Office: [phone]

## 2018-08-31 ENCOUNTER — Inpatient Hospital Stay (HOSPITAL_COMMUNITY): Payer: Medicare Other

## 2018-08-31 ENCOUNTER — Encounter (HOSPITAL_COMMUNITY): Payer: Self-pay | Admitting: Internal Medicine

## 2018-08-31 LAB — LUPUS ANTICOAGULANT
DRVVT: 38.1 s (ref 0.0–47.0)
PTT Lupus Anticoagulant: 26.2 s (ref 0.0–51.9)
Thrombin Time: 18.1 s (ref 0.0–23.0)
dPT Confirm Ratio: 1.08 Ratio (ref 0.00–1.40)
dPT: 38.7 s (ref 0.0–55.0)

## 2018-08-31 LAB — GLUCOSE, CAPILLARY
Glucose-Capillary: 172 mg/dL — ABNORMAL HIGH (ref 70–99)
Glucose-Capillary: 145 mg/dL — ABNORMAL HIGH (ref 70–99)
Glucose-Capillary: 170 mg/dL — ABNORMAL HIGH (ref 70–99)

## 2018-08-31 NOTE — Progress Notes (Signed)
Patient being discharged home with home health.  Patient to be transported by her daughter.  IV removed with the catheter intact.  Discharge instructions and prescription information given with the patient verbalizing understanding. 

## 2018-08-31 NOTE — Plan of Care (Signed)
Progressing towards goals

## 2018-08-31 NOTE — Progress Notes (Signed)
Inpatient Diabetes Program Recommendations  AACE/ADA: New Consensus Statement on Inpatient Glycemic Control (2015)  Target Ranges:  Prepandial:   less than 140 mg/dL      Peak postprandial:   less than 180 mg/dL (1-2 hours)      Critically ill patients:  140 - 180 mg/dL   Lab Results  Component Value Date   GLUCAP 170 (H) 08/31/2018   HGBA1C 10.6 (H) 08/28/2018    Review of Glycemic Control Results for AERIONNA, MORAVEK (MRN [id number]) as of 08/31/2018 09:18  Ref. Range 08/30/2018 23:52 08/31/2018 04:02 08/31/2018 06:23 08/31/2018 08:25  Glucose-Capillary Latest Ref Range: 70 - 99 mg/dL 208 (H) 172 (H) 145 (H) 170 (H)   Diabetes history: DM2 Outpatient Diabetes medications: Humalog 50/50 60-80 units tidwc, Januvia 100 mg QD, metformin 500 mg bid Current orders for Inpatient glycemic control: 70/30 60 units bid, Novolog 0-15 units tidwc and 0-5 units QHS, Tradjenta 5 mg QD  Inpatient Diabetes Program Recommendations:    Spoke with patient regarding DM management. Patient confirms home medications and taking prescribed by PCP.  Reviewed patient's current A1c of 10.6%. Explained what a A1c is and what it measures. Also reviewed goal A1c with patient, importance of good glucose control @ home, and blood sugar goals. Reviewed patho of DM, need for insulin, role of pancreas, vascular changes and comorbidites. Patient has a meter and checks 4 times per day with a range of 120-150 mg/dL. Reports checking before a meal. Encouraged to check a couple hours following meals as I anticipate the glucose trends are higher. Patient denies drinking sugary beverages, uses the plate method and attempts to watch portion sizes and be mindful of carb intake. Verbalizes frustration with A1C and feels like she should see better results. Encouraged seeing endocrinology outpatient, will attach list. Also, discussed checking post prandials for the next few weeks and reaching out to PCP until endo visit as this may  demonstrate insulin needs. Reviewed when to call PCP and provided encouragement. Patient has no further questions at this time.   Thanks, Bronson Curb, MSN, RNC-OB Diabetes Coordinator [phone] (8a-5p)

## 2018-08-31 NOTE — Evaluation (Signed)
Speech Language Pathology Evaluation Patient Details Name: Michelle BoettcherWanda D Shimel MRN: [id number] DOB: 11/02/1965 Today's Date: 08/31/2018 Time: [id number] SLP Time Calculation (min) (ACUTE ONLY): 28 min  Problem List:  Patient Active Problem List   Diagnosis Date Noted  . CVA (cerebral vascular accident) (HCC) 08/27/2018  . Dehydration   . Vomiting in adult   . Right hemiparesis (HCC) 10/05/2014  . Diabetic neuropathy (HCC) 09/15/2014  . GERD (gastroesophageal reflux disease) 09/15/2014  . Stroke (HCC) 08/18/2014  . CVA (cerebral infarction) 08/18/2014  . Hot flashes 12/03/2012  . High risk sexual behavior 05/13/2012  . Chronic low back pain 12/29/2011  . Obesity 10/23/2011  . Financial difficulties 10/23/2011  . Poor dentition 10/23/2011  . Paresthesia of left arm and leg 10/23/2011  . Irregular periods/menstrual cycles 09/12/2011  . Preventative health care 05/13/2011  . Dyslipidemia 04/01/2011  . Hypertension 03/27/2011  . History of stroke 03/13/2010  . Type 2 diabetes mellitus with hyperlipidemia (HCC) 02/11/2004   Past Medical History:  Past Medical History:  Diagnosis Date  . Chronic back pain   . Diabetes mellitus type 2 in obese (HCC)   . Diabetic retinopathy (HCC)   . GERD (gastroesophageal reflux disease)   . Headache   . History of stroke 03/2010   Right MCA stroke in 03/2010, with MRI also noting subacute strokes in left fronto parietal area [place] [facility] to uncontrolled blood pressure // No residual deficits // [facility] - normal LV systolic function, moderate pericardial effusion with 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  [name]. HSV-2 infection   . Hyperlipidemia LDL goal < 100 04/01/2011  . Hypertension   . Lumbago    pain in the back  . Stroke [facility](HCC)    Past Surgical History:  Past Surgical History:  Procedure Laterality Date  . BREAST LUMPECTOMY   2002   states benign lymph node removed.  . BUBBLE STUDY  08/30/2018   Procedure: BUBBLE STUDY;  Surgeon: Vesta MixerNahser, Philip J, MD;  [facility];  Service: Cardiovascular;;  . LOOP RECORDER INSERTION N/A 08/30/2018   Procedure: LOOP RECORDER INSERTION;  Surgeon: Marinus Mawaylor, Gregg W, MD;  [facility];  Service: Cardiovascular;  Laterality: N/A;  . TEE WITHOUT CARDIOVERSION N/A 08/30/2018   Procedure: TRANSESOPHAGEAL ECHOCARDIOGRAM (TEE);  Surgeon: Vesta MixerNahser, Philip J, MD;  [facility];  Service: Cardiovascular;  Laterality: N/A;   HPI:  Pt is a 53 y.o. female with history of multiple strokes with no residual deficits, diabetes, hypertension, hyperlipidemia, headaches, and chronic back pain, who presented with sudden onset of dizziness and multiple episodes of vomiting this morning. She did not receive IV t-PA due to late presentation (>4.5 hours from time of onset). MRI of the brain revealed mderate-sized acute right cerebellar infarct.    Assessment / Plan / Recommendation Clinical Impression  Pt reported that she currently lives alone and is on disability. She reported that she has been having difficulty with her memory for years and therefore now writes everything down. She denied any changes in her speech or language and stated that she is unsure whether she has had any changes in cognition since she "is not at home" and "has not challenged" herself. [facility] 8.1 was completed to evaluate the pt's cognitive-linguistic skills. She achieved a score of 18/30 which is below the normal limits of 26 or more out of 30  and is suggestive of a mild-moderate impairment. She demonstrated deficits in the areas of attention, mental manipulation, divergent naming, abstract reasoning, and delayed recall. Skilled SLP services are clinically indicated at this time to improve cognition. Pt, and nursing were educated regarding results and recommendations; both parties  verbalized understanding as well as agreement with plan of care.    SLP Assessment  SLP Recommendation/Assessment: Patient needs continued Speech Lanaguage Pathology Services SLP Visit Diagnosis: Cognitive communication deficit (R41.841)    Follow Up Recommendations  Home health SLP    Frequency and Duration min 2x/week  2 weeks      SLP Evaluation Cognition  Overall Cognitive Status: Impaired/Different from baseline Arousal/Alertness: Awake/alert Orientation Level: Oriented X4 Attention: Focused;Sustained Focused Attention: Appears intact(Vigilance WNL: 1/1) Sustained Attention: Impaired Sustained Attention Impairment: Verbal complex(Serial 7s: 0/3) Memory: Impaired Memory Impairment: Storage deficit;Retrieval deficit;Decreased recall of new information(Immediate: 5/5; Delayed: 3/5; with cues: ) Awareness: Appears intact Problem Solving: Appears intact Executive Function: Reasoning;Sequencing;Organizing Reasoning: Impaired Reasoning Impairment: Verbal complex(Abstraction: 1/2) Sequencing: Appears intact(Clock drawing: 3/3) Organizing: Impaired Organizing Impairment: Verbal complex(Backward digit span: 0/1)       Comprehension  Auditory Comprehension Overall Auditory Comprehension: Appears within functional limits for tasks assessed Yes/No Questions: Within Functional Limits Commands: Within Functional Limits Conversation: Complex(Trail completion: 1/1) Visual Recognition/Discrimination Discrimination: Within Function Limits Reading Comprehension Reading Status: Within funtional limits    Expression Expression Primary Mode of Expression: Verbal Verbal Expression Overall Verbal Expression: Appears within functional limits for tasks assessed Initiation: No impairment Repetition: Impaired Level of Impairment: Sentence level(0/2) Naming: No impairment Confrontation: Within functional limits(3/3) Divergent: (0/1) Written Expression Dominant Hand: Right Written  Expression: (Difficulty copying cube: 0/1)   Oral / Motor  Oral Motor/Sensory Function Overall Oral Motor/Sensory Function: Within functional limits Motor Speech Overall Motor Speech: Appears within functional limits for tasks assessed Respiration: Within functional limits Phonation: Normal Resonance: Within functional limits Articulation: Within functional limitis Intelligibility: Intelligible Motor Planning: Witnin functional limits Motor Speech Errors: Not applicable   Gianina Olinde I. Hardin Negus, Bertha, Junior Office number [phone] Pager [place] 08/31/2018, 10:35 AM

## 2018-08-31 NOTE — Progress Notes (Signed)
PROGRESS NOTE    Michelle BoettcherWanda D Brooks  WUJ:[id number] DOB: 02/12/1965 DOA: 08/27/2018 PCP: Michelle ContrasAvbuere, Edwin, MD   Brief Narrative:  Patient is a 53 year old female with history of diabetes type 2, hypertension, recurrent strokes, hyperlipidemia who presented with 1 day history of dizziness, nausea and vomiting. MRI showed moderate-sized acute right cerebellar infarct. Neurologywas following. Stroke work-up completed.  She underwent TEE and loop regular placement today.  She is hemodynamically stable for discharge to home today.  Home health has been arranged.  She should follow-up with neurology as an outpatient  Assessment & Plan:   Active Problems:   Type 2 diabetes mellitus with hyperlipidemia (HCC)   CVA (cerebral vascular accident) (HCC)  Acute ischemic stroke:MRI showed moderate sized acute right cerebellar infarct. Patient presented with dizziness, nausea and vomiting. Currently she does not have any focal neurological deficits. Patient has history of multiple ischemic strokes and this is the fourth time. She is on aspirin and Plavix at home. Neurology following. PT/OT consultation done and recommended home health PT. Echocardiogram showed ejection fraction of 60 to 60%, no source of embolus, impaired left ventricular relaxation. CT angio head and neck showedIntracranial occlusive disease progressive over time.Vasculitis suspected, but most of the vasculitis panel was found to be negative. Negativelower extremity venous duplex. Underwent  TEE and loop recorder for thework-up of cryptogenic stroke. Follow up with neurology as an outpatient.  Dizziness: Dizziness has improved.  Continue meclizine as needed  Diabetes type 2: Pretty uncontrolled. Hemoglobin A1c in the range of 10. On insulin, antihyperglycemics at home. Being followed by diabetic coordinator consult.  Continue home regimen.  Follow-up closely with PCP.  Hypertension:Continue home regimen.  Hyperlipidemia:  Continue lipitor.LDL of 112          DVT prophylaxis:Lovenox Code Status: Full Family Communication: None Disposition Plan: Home with home health today   Consultants: Neurology  Procedures:TEE,loop recorder placment  Antimicrobials:  Anti-infectives (From admission, onward)   None      Subjective: Feels better this morning.  Complains of some throat discomfort secondary to TEE.  No dizziness, nausea or vomiting.  Objective: Vitals:   08/30/18 2043 08/30/18 2352 08/31/18 0401 08/31/18 0700  BP: (!) 161/69 (!) 162/66 (!) 145/70 (!) 168/89  Pulse: 88 80 75 77  Resp: 18 20 18 18   Temp: 99.7 F (37.6 C) 98.7 F (37.1 C) 98.7 F (37.1 C) 98.9 F (37.2 C)  TempSrc: Oral Oral Oral Oral  SpO2: 99% 100% 99% 99%  Weight:      Height:        Intake/Output Summary (Last 24 hours) at 08/31/2018 0925 Last data filed at 08/31/2018 0700 Gross per 24 hour  Intake 860 ml  Output 850 ml  Net 10 ml   Filed Weights   08/27/18 1048 08/27/18 2028  Weight: 113.4 kg 119.7 kg    Examination:  General exam: Appears calm and comfortable ,Not in distress,obese HEENT:PERRL,Oral mucosa moist, Ear/Nose normal on gross exam Respiratory system: Bilateral equal air entry, normal vesicular breath sounds, no wheezes or crackles  Cardiovascular system: S1 & S2 heard, RRR. No JVD, murmurs, rubs, gallops or clicks. No pedal edema. Gastrointestinal system: Abdomen is nondistended, soft and nontender. No organomegaly or masses felt. Normal bowel sounds heard. Central nervous system: Alert and oriented. No focal neurological deficits. Extremities: No edema, no clubbing ,no cyanosis, distal peripheral pulses palpable. Skin: No rashes, lesions or ulcers,no icterus ,no pallor MSK: Normal muscle bulk,tone ,power Psychiatry: Judgement and insight appear normal. Mood & affect  appropriate.     Data Reviewed: I have personally reviewed following labs and imaging studies  CBC: Recent Labs  Lab  08/27/18 1012 08/27/18 1035  WBC 9.5  --   HGB 13.7 14.6  HCT 42.5 43.0  MCV 86.9  --   PLT 288  --    Basic Metabolic Panel: Recent Labs  Lab 08/27/18 1012 08/27/18 1035 08/29/18 0508 08/30/18 0527  NA 136 138 138 137  K 4.2 4.2 3.7 3.4*  CL 100  --  104 103  CO2 24  --  27 27  GLUCOSE 328*  --  192* 164*  BUN 16  --  16 15  CREATININE 1.29*  --  1.15* 1.14*  CALCIUM 10.0  --  8.9 8.5*   GFR: Estimated Creatinine Clearance: 77.7 mL/min (A) (by C-G formula based on SCr of 1.14 mg/dL (H)). Liver Function Tests: No results for input(s): AST, ALT, ALKPHOS, BILITOT, PROT, ALBUMIN in the last 168 hours. No results for input(s): LIPASE, AMYLASE in the last 168 hours. No results for input(s): AMMONIA in the last 168 hours. Coagulation Profile: No results for input(s): INR, PROTIME in the last 168 hours. Cardiac Enzymes: No results for input(s): CKTOTAL, CKMB, CKMBINDEX, TROPONINI in the last 168 hours. BNP (last 3 results) No results for input(s): PROBNP in the last 8760 hours. HbA1C: No results for input(s): HGBA1C in the last 72 hours. CBG: Recent Labs  Lab 08/30/18 2048 08/30/18 2352 08/31/18 0402 08/31/18 0623 08/31/18 0825  GLUCAP 304* 208* 172* 145* 170*   Lipid Profile: No results for input(s): CHOL, HDL, LDLCALC, TRIG, CHOLHDL, LDLDIRECT in the last 72 hours. Thyroid Function Tests: No results for input(s): TSH, T4TOTAL, FREET4, T3FREE, THYROIDAB in the last 72 hours. Anemia Panel: No results for input(s): VITAMINB12, FOLATE, FERRITIN, TIBC, IRON, RETICCTPCT in the last 72 hours. Sepsis Labs: No results for input(s): PROCALCITON, LATICACIDVEN in the last 168 hours.  Recent Results (from the past 240 hour(s))  SARS Coronavirus 2 (CEPHEID - Performed in [place] hospital lab), Hosp Order     Status: None   Collection Time: 08/27/18  6:54 PM   Specimen: Nasopharyngeal Swab  Result Value Ref Range Status   SARS Coronavirus 2 NEGATIVE NEGATIVE Final     Comment: (NOTE) If result is NEGATIVE SARS-CoV-2 target nucleic acids are NOT DETECTED. The SARS-CoV-2 RNA is generally detectable in upper and lower  respiratory specimens during the acute phase of infection. The lowest  concentration of SARS-CoV-2 viral copies this assay can detect is 250  copies / mL. A negative result does not preclude SARS-CoV-2 infection  and should not be used as the sole basis for treatment or other  patient management decisions.  A negative result may occur with  improper specimen collection / handling, submission of specimen other  than nasopharyngeal swab, presence of viral mutation(s) within the  areas targeted by this assay, and inadequate number of viral copies  (<250 copies / mL). A negative result must be combined with clinical  observations, patient history, and epidemiological information. If result is POSITIVE SARS-CoV-2 target nucleic acids are DETECTED. The SARS-CoV-2 RNA is generally detectable in upper and lower  respiratory specimens dur ing the acute phase of infection.  Positive  results are indicative of active infection with SARS-CoV-2.  Clinical  correlation with patient history and other diagnostic information is  necessary to determine patient infection status.  Positive results do  not rule out bacterial infection or co-infection with other viruses. If result  is PRESUMPTIVE POSTIVE SARS-CoV-2 nucleic acids MAY BE PRESENT.   A presumptive positive result was obtained on the submitted specimen  and confirmed on repeat testing.  While 2019 novel coronavirus  (SARS-CoV-2) nucleic acids may be present in the submitted sample  additional confirmatory testing may be necessary for epidemiological  and / or clinical management purposes  to differentiate between  SARS-CoV-2 and other Sarbecovirus currently known to infect humans.  If clinically indicated additional testing with an alternate test  methodology [phone]) is advised. The SARS-CoV-2  RNA is generally  detectable in upper and lower respiratory sp ecimens during the acute  phase of infection. The expected result is Negative. Fact Sheet for Patients:  BoilerBrush.com.cyhttps://www.fda.gov/media/136312/download Fact Sheet for Healthcare Providers: https://pope.com/https://www.fda.gov/media/136313/download This test is not yet approved or cleared by the [place] FDA and has been authorized for detection and/or diagnosis of SARS-CoV-2 by FDA under an Emergency Use Authorization (EUA).  This EUA will remain in effect (meaning this test can be used) for the duration of the COVID-19 declaration under Section 564(b)(1) of the Act, 21 U.S.C. section 360bbb-3(b)(1), unless the authorization is terminated or revoked sooner. Performed at [facility], [address] [place], [place] [address]          Radiology Studies: Vas Koreas Lower Extremity Venous (dvt)  Result Date: 08/30/2018  Lower Venous Study Indications: Stroke.  Risk Factors: None identified. Comparison Study: No prior studies. Performing Technologist: Chanda BusingGregory Collins RVT  Examination Guidelines: A complete evaluation includes B-mode imaging, spectral Doppler, color Doppler, and power Doppler as needed of all accessible portions of each vessel. Bilateral testing is considered an integral part of a complete examination. Limited examinations for reoccurring indications may be performed as noted.  +---------+---------------+---------+-----------+----------+-------+  RIGHT     Compressibility Phasicity Spontaneity Properties Summary  +---------+---------------+---------+-----------+----------+-------+  CFV       Full            Yes       Yes                             +---------+---------------+---------+-----------+----------+-------+  SFJ       Full                                                      +---------+---------------+---------+-----------+----------+-------+  FV Prox   Full                                                       +---------+---------------+---------+-----------+----------+-------+  FV Mid    Full                                                      +---------+---------------+---------+-----------+----------+-------+  FV Distal Full                                                      +---------+---------------+---------+-----------+----------+-------+  PFV       Full                                                      +---------+---------------+---------+-----------+----------+-------+  POP       Full            Yes       Yes                             +---------+---------------+---------+-----------+----------+-------+  PTV       Full                                                      +---------+---------------+---------+-----------+----------+-------+  PERO      Full                                                      +---------+---------------+---------+-----------+----------+-------+   +---------+---------------+---------+-----------+----------+-------+  LEFT      Compressibility Phasicity Spontaneity Properties Summary  +---------+---------------+---------+-----------+----------+-------+  CFV       Full            Yes       Yes                             +---------+---------------+---------+-----------+----------+-------+  SFJ       Full                                                      +---------+---------------+---------+-----------+----------+-------+  FV Prox   Full                                                      +---------+---------------+---------+-----------+----------+-------+  FV Mid    Full                                                      +---------+---------------+---------+-----------+----------+-------+  FV Distal Full                                                      +---------+---------------+---------+-----------+----------+-------+  PFV       Full                                                      +---------+---------------+---------+-----------+----------+-------+  POP       Full             Yes       Yes                             +---------+---------------+---------+-----------+----------+-------+  PTV       Full                                                      +---------+---------------+---------+-----------+----------+-------+  PERO      Full                                                      +---------+---------------+---------+-----------+----------+-------+     Summary: Right: There is no evidence of deep vein thrombosis in the lower extremity. No cystic structure found in the popliteal fossa. Left: There is no evidence of deep vein thrombosis in the lower extremity. No cystic structure found in the popliteal fossa.  *See table(s) above for measurements and observations. Electronically signed by Lemar LivingsBrandon Cain MD on 08/30/2018 at 3:53:54 PM.    Final         Scheduled Meds:  aspirin EC  81 mg Oral Daily   atorvastatin  40 mg Oral q1800   clopidogrel  75 mg Oral Daily   enoxaparin (LOVENOX) injection  40 mg Subcutaneous Q24H   insulin aspart  0-15 Units Subcutaneous TID WC   insulin aspart  0-5 Units Subcutaneous QHS   insulin aspart protamine- aspart  60 Units Subcutaneous BID WC   linagliptin  5 mg Oral Daily   Continuous Infusions:   LOS: 4 days    Time spent: 25 mins.More than 50% of that time was spent in counseling and/or coordination of care.      Burnadette PopAmrit Odilia Damico, MD [facility] Pager [phone]  If 7PM-7AM, please contact night-coverage [email or web address] Password [email or web address] 08/31/2018, 9:25 AM

## 2018-08-31 NOTE — Progress Notes (Signed)
Occupational Therapy Treatment Patient Details Name: Michelle Brooks MRN: [id number] DOB: 1965-11-14 Today's Date: 08/31/2018    History of present illness Pt is a 53 yo female s/p Dizziness, N/V found to have acute cerebellar infarct. Pt PMHx: chronic back pain, DMT2, GERD, H/O CVA, HTN.    OT comments  Pt making steady progress towards goals this session. Pt is excited about discharge today from hospital. Pt agreeable to getting dressed during this session. She ambulates with SPC and min guard to obtain needed items. Self care completed with sit<>stand from EOB. Pt fatigues very quickly and needs continued min guard for standing balance during LB clothing management. Pt will continue to need HHOT at discharge and would benefit from energy conservation education.   Follow Up Recommendations  Home health OT    Equipment Recommendations  None recommended by OT       Precautions / Restrictions Precautions Precautions: Fall Restrictions Weight Bearing Restrictions: No       Mobility Bed Mobility Overal bed mobility: Needs Assistance Bed Mobility: Supine to Sit;Sit to Supine     Supine to sit: Supervision Sit to supine: Supervision   General bed mobility comments: SupervisionA for safety  Transfers Overall transfer level: Needs assistance Equipment used: Straight cane Transfers: Sit to/from Stand Sit to Stand: Min guard         General transfer comment: min guard for immediate standing balance    Balance Overall balance assessment: Needs assistance Sitting-balance support: Feet supported Sitting balance-Leahy Scale: Fair     Standing balance support: During functional activity Standing balance-Leahy Scale: Poor Standing balance comment: min A for standing balance         ADL either performed or assessed with clinical judgement   ADL Overall ADL's : Needs assistance/impaired          General ADL Comments: min guard overall for balance with standing tasks  while dressing and obtaining clothing items.               Cognition Arousal/Alertness: Awake/alert Behavior During Therapy: WFL for tasks assessed/performed Overall Cognitive Status: Impaired/Different from baseline                      Pertinent Vitals/ Pain       Pain Assessment: No/denies pain  Home Living     Available Help at Discharge: Family;Available PRN/intermittently Type of Home: Apartment         Lives With: Alone        Frequency  Min 2X/week        Progress Toward Goals  OT Goals(current goals can now be found in the care plan section)  Progress towards OT goals: Progressing toward goals  Acute Rehab OT Goals Patient Stated Goal: to go home OT Goal Formulation: With patient Time For Goal Achievement: 09/14/18 Potential to Achieve Goals: Good  Plan Discharge plan remains appropriate       AM-PAC OT "6 Clicks" Daily Activity     Outcome Measure   Help from another person eating meals?: None Help from another person taking care of personal grooming?: None Help from another person toileting, which includes using toliet, bedpan, or urinal?: A Little Help from another person bathing (including washing, rinsing, drying)?: A Little Help from another person to put on and taking off regular upper body clothing?: None Help from another person to put on and taking off regular lower body clothing?: A Little 6 Click Score: 21    End of  Session Equipment Utilized During Treatment: [facility])  OT Visit Diagnosis: Unsteadiness on feet (R26.81);Pain   Activity Tolerance Patient tolerated treatment well   Patient Left in bed;with call bell/phone within reach;with bed alarm set   Nurse Communication Mobility status        Time: [id number] OT Time Calculation (min): 23 min  Charges: OT General Charges $OT Visit: 1 Visit OT Treatments $Self Care/Home Management : 23-37 mins   Nigel Wessman P, MS, OTR/L 08/31/2018, 10:48 AM

## 2018-08-31 NOTE — Discharge Instructions (Signed)
If you develop intractable vomiting, abdominal pain, chest pain, severe headache, blurry vision, dizziness, especially if you cannot walk, or any other new/concerning symptoms then return to the ER for evaluation.   Be sure to drink plenty of fluids.   Heart monitor implant site/wound care instructions Keep incision clean and dry for 3 days. You can remove outer dressing tomorrow. Leave steri-strips (little pieces of tape) on until seen in the office for wound check appointment. Call the office [phone]) for redness, drainage, swelling, or fever.  Local Endocrinologists [facility] [phone]) 1. Dr. Philemon Kingdom 2. Dr. Janie Morning [facility] [phone]) Dr. Delrae Rend   [facility] [phone]) 1. Dr. Jacelyn Pi 2. Dr. Anda Kraft [facility] [phone]) 1. Dr. Daneil Dolin [facility] [phone]) [[place] office]  [phone]) [[place] office] 1. Dr. Lenna Sciara Solum 2. Dr. Judithann Sheen [facility]) [phone]) 1. Autumn Hudnall Ronnald Ramp), PA 2. Dr. Amalia Greenhouse 3. Dr. Marsh Dolly. [facility] [phone]) 1. Dr. Glade Lloyd [facility] [phone]) 1. Dr. Orville Govern 2. Dr. Lelon Huh 3. Dr. Jerelene Redden 4. Alwyn Ren, FNP Dr. Carolynn Serve. [facility] in [place] [phone])

## 2018-09-01 ENCOUNTER — Telehealth: Payer: Self-pay

## 2018-09-01 NOTE — Telephone Encounter (Signed)
Notes recorded by Marval Regal, RN on 09/01/2018 at 10:05 AM EDT  I called pt that her lupus anticoagulant was negative. Pt verbalized understanding.

## 2018-09-01 NOTE — Telephone Encounter (Signed)
-----   Message from Garvin Fila, MD sent at 08/31/2018  4:41 PM EDT ----- Michelle Brooks inform the patient that test for lupus anticoagulant was negative

## 2018-09-06 ENCOUNTER — Telehealth: Payer: Self-pay | Admitting: Internal Medicine

## 2018-09-06 NOTE — Telephone Encounter (Signed)
Patient called stating she just got out of [facility] on 08/27/18 and they stated for her to call and request an appointment with Dr.Shamleffer. DX reason for appointment: Diabetes  Can we schedule a new patient appointment with you?

## 2018-09-06 NOTE — Telephone Encounter (Signed)
There's no referral from them about diabetes. My suggestion is she needs to see her PCP first and if he feels that she needs a referral , he will do it

## 2018-09-09 ENCOUNTER — Ambulatory Visit: Payer: Medicaid Other

## 2018-09-22 ENCOUNTER — Telehealth: Payer: Self-pay

## 2018-09-22 NOTE — Telephone Encounter (Signed)
    COVID-19 Pre-Screening Questions:  . In the past 7 to 10 days have you had a cough,  shortness of breath, headache, congestion, fever (100 or greater) body aches, chills, sore throat, or sudden loss of taste or sense of smell? No . Have you been around anyone with known Covid 19. No . Have you been around anyone who is awaiting Covid 19 test results in the past 7 to 10 days? No . Have you been around anyone who has been exposed to Covid 19, or has mentioned symptoms of Covid 19 within the past 7 to 10 days? No  If you have any concerns/questions about symptoms patients report during screening (either on the phone or at threshold). Contact the provider seeing the patient or DOD for further guidance.  If neither are available contact a member of the leadership team.         Pt answered no to all covid-19 prescreening questions. I asked the pt to wear a mask to her appointment. I told her we are reducing the number of people coming into the office and if she can physically come alone to please do so. I told her if anything changes between now and her appointment time to call and let the office know. The pt verbalized understanding.

## 2018-09-23 ENCOUNTER — Other Ambulatory Visit: Payer: Self-pay

## 2018-09-23 ENCOUNTER — Ambulatory Visit (INDEPENDENT_AMBULATORY_CARE_PROVIDER_SITE_OTHER): Payer: Medicare Other | Admitting: *Deleted

## 2018-09-23 DIAGNOSIS — I6300 Cerebral infarction due to thrombosis of unspecified precerebral artery: Secondary | ICD-10-CM

## 2018-09-23 LAB — CUP PACEART INCLINIC DEVICE CHECK
Date Time Interrogation Session: 20200813125835
Implantable Pulse Generator Implant Date: 20200720

## 2018-09-23 NOTE — Progress Notes (Signed)
ILR wound check in clinic. R waves 0.46mV. Steri strips removed. Wound well healed. Home monitor transmitting nightly. No episodes. Questions answered.

## 2018-10-04 ENCOUNTER — Ambulatory Visit (INDEPENDENT_AMBULATORY_CARE_PROVIDER_SITE_OTHER): Payer: Medicare Other | Admitting: *Deleted

## 2018-10-04 DIAGNOSIS — I6300 Cerebral infarction due to thrombosis of unspecified precerebral artery: Secondary | ICD-10-CM

## 2018-10-05 LAB — CUP PACEART REMOTE DEVICE CHECK
Date Time Interrogation Session: 20200822193742
Implantable Pulse Generator Implant Date: 20200720

## 2018-10-11 ENCOUNTER — Other Ambulatory Visit: Payer: Self-pay

## 2018-10-11 ENCOUNTER — Ambulatory Visit (INDEPENDENT_AMBULATORY_CARE_PROVIDER_SITE_OTHER): Payer: Medicare Other | Admitting: Adult Health

## 2018-10-11 ENCOUNTER — Encounter: Payer: Self-pay | Admitting: Adult Health

## 2018-10-11 VITALS — BP 133/91 | HR 75 | Temp 97.0°F | Ht 68.0 in | Wt 272.6 lb

## 2018-10-11 DIAGNOSIS — I10 Essential (primary) hypertension: Secondary | ICD-10-CM

## 2018-10-11 DIAGNOSIS — I639 Cerebral infarction, unspecified: Secondary | ICD-10-CM

## 2018-10-11 DIAGNOSIS — E1169 Type 2 diabetes mellitus with other specified complication: Secondary | ICD-10-CM

## 2018-10-11 DIAGNOSIS — I675 Moyamoya disease: Secondary | ICD-10-CM

## 2018-10-11 DIAGNOSIS — I669 Occlusion and stenosis of unspecified cerebral artery: Secondary | ICD-10-CM

## 2018-10-11 DIAGNOSIS — E785 Hyperlipidemia, unspecified: Secondary | ICD-10-CM | POA: Diagnosis not present

## 2018-10-11 NOTE — Progress Notes (Signed)
[facility] [address] [place]. [place] [address] [phone]       HOSPITAL FOLLOW UP NOTE  Michelle Brooks Date of Birth:  [birth year] Medical Record Number:  [id number]   Reason for Referral:  hospital stroke follow up    CHIEF COMPLAINT:  Chief Complaint  Patient presents with   Hospitalization Follow-up    Alone. Treatment room. No new concerns at this time.     HPI: Michelle Brooks being seen today for in office hospital follow-up regarding cryptogenic right cerebellar infarct on 08/27/2018.  History obtained from patient and chart review. Reviewed all radiology images and labs personally.  Michelle Brooks is a 53 y.o. female with history of multiple strokes with no residual deficits, diabetes, hypertension, hyperlipidemia, headaches, and chronic back pain, presenting with sudden onset of dizziness and multiple episodes of vomiting this morning.. She did not receive IV t-PA due to late presentation (>4.5 hours from time of onset).  Neurology consulted with stroke work-up revealing acute right cerebellar infarct embolic pattern as evidenced on MRI secondary to unknown source with resultant dizziness.  MRI also showed chronic ischemia with old small medullary and right frontal infarcts.  CT angiogram showed intracranial occlusive disease progressive over time with possible vasculitis for majority of vasculitis panel negative.  2D echo normal EF without cardiac source of embolus or PFO.  TEE unremarkable therefore loop recorder placed for long-term management of atrial fibrillation.  Lower extremity Dopplers negative for DVT.  Vasculitis suspected but panel unremarkable.  ANA is positive but dsDNA is negative without active symptoms of lupus.  Lupus anticoagulant negative.  ESR 13 mm.  Previously on DAPT and recommended continuation for secondary stroke prevention.  HTN stable.  LDL 116 and recommended changing statin therapy to atorvastatin 40 mg daily.   Uncontrolled DM with A1c 10.6.  Other stroke risk factors include former tobacco use, obesity and history of stroke/TIA.  Michelle Brooks is being seen today for hospital follow-up.  She has been stable from a stroke standpoint without new or worsening stroke/TIA symptoms.  She continues have difficulty with balance but this has been ongoing and does not believe there is been worsening.  She continues to use a cane for ambulation and denies any recent falls.  She continues to live independently maintaining all ADLs and IADLs without difficulty.  She has continued on aspirin 81 mg and clopidogrel without bleeding or bruising.  Continues on atorvastatin without myalgias.  Blood pressure today stable at 133/91.  Glucose levels have been stable per patient report.  Loop recorder has not shown atrial fibrillation thus far.  No further concerns at this time.    ROS:   14 system review of systems performed and negative with exception of see HPI  PMH:  Past Medical History:  Diagnosis Date   Chronic back pain    Diabetes mellitus type 2 in obese (HCC)    Diabetic retinopathy ([place])    GERD (gastroesophageal reflux disease)    Headache    History of stroke 03/2010   Right MCA stroke in 03/2010, with MRI also noting subacute strokes in left fronto parietal area - occured in [place] received care at [facility] thought due to uncontrolled blood pressure // No residual deficits // TTE (03/2010) at [facility] - normal LV systolic function, moderate pericardial effusion with 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   HSV-2 infection    Hyperlipidemia LDL goal <  100 04/01/2011   Hypertension    Lumbago    pain in the back   Stroke [facility])     PSH:  Past Surgical History:  Procedure Laterality Date   BREAST LUMPECTOMY  2002   states benign lymph node removed.   BUBBLE STUDY  08/30/2018   Procedure: BUBBLE STUDY;  Surgeon: Thayer Headings, MD;   Location: [facility];  Service: Cardiovascular;;   LOOP RECORDER INSERTION N/A 08/30/2018   Procedure: LOOP RECORDER INSERTION;  Surgeon: Evans Lance, MD;  Location: [place] CV LAB;  Service: Cardiovascular;  Laterality: N/A;   TEE WITHOUT CARDIOVERSION N/A 08/30/2018   Procedure: TRANSESOPHAGEAL ECHOCARDIOGRAM (TEE);  Surgeon: Acie Fredrickson, Wonda Cheng, MD;  Location: [facility] ENDOSCOPY;  Service: Cardiovascular;  Laterality: N/A;    Social History:  Social History   Socioeconomic History   Marital status: Divorced    Spouse name: Not on file   Number of children: 3   Years of education: 12th grade   Highest education level: Not on file  Occupational History   Occupation: unemployed    Comment: previously worked in [place] before her stroke  Scientist, product/process development strain: Not on file   Food insecurity    Worry: Not on file    Inability: Not on file   Transportation needs    Medical: Not on file    Non-medical: Not on file  Tobacco Use   Smoking status: Former Smoker    Packs/day: 0.20    Years: 7.00    Pack years: 1.40    Types: Cigarettes    Quit date: 03/26/2004    Years since quitting: 14.5   Smokeless tobacco: Never Used  Substance and Sexual Activity   Alcohol use: No   Drug use: No   Sexual activity: Yes    Partners: Male    Birth control/protection: Condom  Lifestyle   Physical activity    Days per week: Not on file    Minutes per session: Not on file   Stress: Not on file  Relationships   Social connections    Talks on phone: Not on file    Gets together: Not on file    Attends religious service: Not on file    Active member of club or organization: Not on file    Attends meetings of clubs or organizations: Not on file    Relationship status: Not on file   Intimate partner violence    Fear of current or ex partner: Not on file    Emotionally abused: Not on file    Physically abused: Not on file    Forced sexual activity: Not  on file  Other Topics Concern   Not on file  Social History Narrative   Recently moved from [place] in 10/2010 due to wanting a change from stressors.   Previously followed by Dr. Wynelle Bourgeois at [place] History:  Family History  Problem Relation Age of Onset   Diabetes Mother    Stroke Mother 60   Diabetes Brother    Diabetes Brother    Cancer Brother 71       unknown type   Hypertension Brother     Medications:   Current Outpatient Medications on File Prior to Visit  Medication Sig Dispense Refill   acyclovir (ZOVIRAX) 400 MG tablet Take 1 tablet (400 mg total) by mouth 3 (three) times daily. (Patient taking differently: Take 400 mg by mouth  3 (three) times daily. Prn for flare up) 21 tablet 1   aspirin 81 MG tablet Take 81 mg by mouth daily.     atorvastatin (LIPITOR) 40 MG tablet Take 1 tablet (40 mg total) by mouth daily at 6 PM. 30 tablet 0   Blood Glucose Monitoring Suppl (ACCU-CHEK AVIVA PLUS) W/DEVICE KIT 1 kit by Does not apply route 3 (three) times daily before meals. 1 kit 0   Blood Glucose Monitoring Suppl (TRUE METRIX METER) DEVI 1 each by Does not apply route 4 (four) times daily -  before meals and at bedtime. 1 Device 0   clopidogrel (PLAVIX) 75 MG tablet Take 75 mg by mouth daily.     diclofenac sodium (VOLTAREN) 1 % GEL Apply 2 g topically 4 (four) times daily as needed for pain.     fluconazole (DIFLUCAN) 150 MG tablet Take 1 tablet (150 mg total) by mouth daily. 1 tablet 1   gabapentin (NEURONTIN) 300 MG capsule Take 300 mg by mouth 3 (three) times daily.     glucose blood (TRUE METRIX BLOOD GLUCOSE TEST) test strip Use as instructed 100 each 5   insulin lispro protamine-lispro (HUMALOG 50/50) (50-50) 100 UNIT/ML SUSP injection Inject 80 Units into the skin 3 (three) times daily. Up to 60 depending on cbg     JANUVIA 100 MG tablet Take 100 mg by mouth daily.     lisinopril-hydrochlorothiazide (PRINZIDE,ZESTORETIC) 20-12.5 MG per tablet  Take 1 tablet by mouth daily.     meclizine (ANTIVERT) 25 MG tablet Take 1 tablet (25 mg total) by mouth 3 (three) times daily as needed for dizziness. 30 tablet 0   metFORMIN (GLUCOPHAGE) 500 MG tablet Take 500 mg by mouth 2 (two) times daily with a meal.     nystatin-triamcinolone ointment (MYCOLOG) Apply 1 application topically 2 (two) times daily. 30 g 0   ondansetron (ZOFRAN ODT) 4 MG disintegrating tablet Take 1 tablet (4 mg total) by mouth every 8 (eight) hours as needed for nausea or vomiting. 10 tablet 0   topiramate (TOPAMAX) 50 MG tablet Take 1 tablet (50 mg total) by mouth 2 (two) times daily. Start 1 tablet daily x 1 week then twice daily 60 tablet 3   TRUEPLUS LANCETS 28G MISC 1 each by Does not apply route 4 (four) times daily -  before meals and at bedtime. 100 each 5   No current facility-administered medications on file prior to visit.     Allergies:   Allergies  Allergen Reactions   Lactose Intolerance (Gi) Diarrhea and Nausea And Vomiting   Latex     Like a burning in area     Physical Exam  Vitals:   10/11/18 1419  BP: (!) 133/91  Pulse: 75  Temp: (!) 97 F (36.1 C)  TempSrc: Oral  Weight: 272 lb 9.6 oz (123.7 kg)  Height: '5\' 8"'  (1.727 m)   Body mass index is 41.45 kg/m. No exam data present  Depression screen [facility] 2/9 10/11/2018  Decreased Interest 0  Down, Depressed, Hopeless 0  PHQ - 2 Score 0  Altered sleeping -  Tired, decreased energy -  Change in appetite -  Feeling bad or failure about yourself  -  Trouble concentrating -  Moving slowly or fidgety/restless -  Suicidal thoughts -  PHQ-9 Score -  Difficult doing work/chores -     General: Obese pleasant middle-aged African-American female, seated, in no evident distress Head: head normocephalic and atraumatic.   Neck: supple with  no carotid or supraclavicular bruits Cardiovascular: regular rate and rhythm, no murmurs Musculoskeletal: no deformity Skin:  no  rash/petichiae Vascular:  Normal pulses all extremities   Neurologic Exam Mental Status: Awake and fully alert. Oriented to place and time. Recent and remote memory intact. Attention span, concentration and fund of knowledge appropriate. Mood and affect appropriate.  Cranial Nerves: Fundoscopic exam reveals sharp disc margins. Pupils equal, briskly reactive to light. Extraocular movements full without nystagmus. Visual fields full to confrontation. Hearing intact. Facial sensation intact. Face, tongue, palate moves normally and symmetrically.  Motor: Normal bulk and tone. Normal strength in all tested extremity muscles. Sensory.: intact to touch , pinprick , position and vibratory sensation.  Coordination: Rapid alternating movements normal in all extremities. Finger-to-nose and heel-to-shin performed accurately bilaterally. Gait and Station: Arises from chair without difficulty. Stance is normal. Gait demonstrates normal stride length with slight imbalance making turns and use of cane Reflexes: 1+ and symmetric. Toes downgoing.     NIHSS  0 Modified Rankin  2    Diagnostic Data (Labs, Imaging, Testing)   CT ANGIO HEAD W OR WO CONTRAST CT ANGIO NECK W OR WO CONTRAST 08/28/2018 IMPRESSION: Intracranial occlusive disease progressive over time. Worsened appearance of the distal right vertebral artery which is very narrow and irregular, giving some supply to a small right PICA. Left vertebral artery is becoming stenotic distally with stenosis at the vertebrobasilar junction. Basilar artery is narrow and irregular. 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 Right anterior cerebral artery remains patent. Right PCA takes fetal origin from the right carotid. Left carotid artery shows a 50% stenosis in the siphon and occlusion of both the anterior and middle cerebral vessels on the left. Left anterior cerebral artery receives it supply from a patent anterior communicating  artery. Left middle cerebral artery receives it supply from collateral vessels and possibly a small amount of antegrade flow. 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  MR BRAIN WO CONTRAST 08/27/2018 IMPRESSION: 1. Moderate-sized acute right cerebellar infarct. 2. 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  ECHO TEE 08/30/2018 IMPRESSIONS  1. The left ventricle has normal systolic function, with an ejection fraction of 60-65%. The cavity size was normal.  2. No evidence of a thrombus present in the left atrial appendage.  3. There is right to left shunting , likely through a PFO using bubble contrast.  4. Mitral valve regurgitation is mild to moderate by color flow Doppler.  5. No stenosis of the aortic valve.  6. The aortic root and ascending aorta are normal in size and structure.  7. Evidence of atrial level shunting detected by color flow Doppler.    ASSESSMENT: Michelle Brooks is a 53 y.o. year old female presented with dizziness and vomiting on 08/27/2018 with stroke work-up revealing right cerebellar infarct secondary to undetermined source therefore loop recorder placed for long-term monitoring of potential atrial fibrillation. Vascular risk factors include multiple strokes, DM, HTN, HLD and intracranial occlusive disease.  She has been doing well since discharge with occasional balance difficulties which has been ongoing even prior to stroke.    PLAN:  1. Right cerebellar infarct : Continue aspirin 81 mg daily and clopidogrel 75 mg daily  and atorvastatin for secondary stroke prevention.  Strict control of hypertension with blood pressure goal below 130/90, diabetes with hemoglobin A1c goal below 6.5% and cholesterol with LDL cholesterol (bad cholesterol) goal below 70 mg/dL.  I also advised the patient to eat a  healthy diet with plenty of whole grains, cereals, fruits and vegetables, exercise regularly with at least 30 minutes of continuous  activity daily and maintain ideal body weight. 2. Intracranial occlusive disease: Continuation of DAPT and will discuss with Dr. Leonie Man in regards to potential indication for long-term DAPT versus 21-monthduration.  Ongoing use of aspirin long-term with initiation of Plavix 3 to 6 months prior she believes by her PCP.  It was also discussed importance of secondary stroke prevention management with DM, HTN and HLD along with importance of weight loss 3. Loop recorder: Device clinic will continue to monitor for potential atrial fibrillation.  Discussion regarding Michelle Haggardtrial but patient is not interested at this time nor does she wish to receive additional information 4. HTN: Advised to continue current treatment regimen.  Today's BP stable.  Advised to continue to monitor at home along with continued follow-up with PCP for management 5. HLD: Advised to continue current treatment regimen along with continued follow-up with PCP for future prescribing and monitoring of lipid panel 6. DMII: Advised to continue to monitor glucose levels at home along with continued follow-up with PCP for management and monitoring    Follow up in 3 months or call earlier if needed   Greater than 50% of time during this 45 minute visit was spent on counseling, explanation of diagnosis of right cerebellar infarcts, reviewing risk factor management of intracranial occlusive disease, HTN, HLD and DM, planning of further management along with potential future management, and discussion with patient and family answering all questions.    JFrann Rider AGNP-BC  [facility] [address] [id number] Phone [phone] [phone]: This document was prepared with digital dictation and possible smart phrase technology. Any transcriptional errors that result from this process are unintentional.

## 2018-10-11 NOTE — Patient Instructions (Signed)
Continue aspirin 81 mg daily and clopidogrel 75 mg daily  and Lipitor for secondary stroke prevention  I will follow-up with Dr. Leonie Man who is a stroke specialist that you are seen by in the hospital regarding ongoing use of both aspirin and Plavix along with need of repeat imaging -we will call you if any changes or additional imaging is needed prior to follow-up visit  We will continue to monitor loop recorder and you will be notified by cardiology with any abnormalities  Continue to follow up with PCP regarding cholesterol, blood pressure and diabetes management   Continue to monitor blood pressure at home  Maintain strict control of hypertension with blood pressure goal below 130/90, diabetes with hemoglobin A1c goal below 6.5% and cholesterol with LDL cholesterol (bad cholesterol) goal below 70 mg/dL. I also advised the patient to eat a healthy diet with plenty of whole grains, cereals, fruits and vegetables, exercise regularly and maintain ideal body weight.  Followup in the future with me in 3 months or call earlier if needed       Thank you for coming to see [place] at [facility]. I hope we have been able to provide you high quality care today.  You may receive a patient satisfaction survey over the next few weeks. We would appreciate your feedback and comments so that we may continue to improve ourselves and the health of our patients.

## 2018-10-11 NOTE — Progress Notes (Signed)
Carelink Summary Report / Loop Recorder 

## 2018-10-12 ENCOUNTER — Telehealth: Payer: Self-pay | Admitting: Adult Health

## 2018-10-12 NOTE — Telephone Encounter (Signed)
Please notify patient that it is recommended to continue aspirin and Plavix combination until 11/30/2018 and at that time she can discontinue aspirin and continue on Plavix alone as it is recommended for only 44-month duration of DAPT per Dr. Leonie Man is no indication for long-term DAPT.  Discussion with patient regarding this possibility at yesterday's visit and patient is aware that I was going to reach out to Dr. Leonie Man regarding this possibility and that she would be called with any updates.

## 2018-10-12 NOTE — Progress Notes (Signed)
I agree with the above plan 

## 2018-10-13 NOTE — Telephone Encounter (Signed)
I called pt and relayed that she is to continue taking both aspirin and plavix until 11-30-18 per Dr. Leonie Man and then after that take only plavix.  She will see her pcp end of September and then will call to make 3 month f/u appt here. She verbalized understanding.

## 2018-11-04 ENCOUNTER — Ambulatory Visit (INDEPENDENT_AMBULATORY_CARE_PROVIDER_SITE_OTHER): Payer: Medicare Other | Admitting: *Deleted

## 2018-11-04 DIAGNOSIS — I6300 Cerebral infarction due to thrombosis of unspecified precerebral artery: Secondary | ICD-10-CM | POA: Diagnosis not present

## 2018-11-05 LAB — CUP PACEART REMOTE DEVICE CHECK
Date Time Interrogation Session: 20200924204039
Implantable Pulse Generator Implant Date: 20200720

## 2018-11-11 NOTE — Progress Notes (Signed)
Carelink Summary Report / Loop Recorder 

## 2018-11-29 ENCOUNTER — Telehealth: Payer: Self-pay | Admitting: Emergency Medicine

## 2018-11-29 NOTE — Telephone Encounter (Signed)
Loop recorder alert showed event that started at 8:52 on 11/27/18 , appeared to be SVT at 188 bpm that lasted 1 minute , 6 seconds. Patient reports that she felt hot and felt her " heart racing". No CP , dizziness or syncope during episode, but she reports having SOB.

## 2018-12-07 ENCOUNTER — Ambulatory Visit (INDEPENDENT_AMBULATORY_CARE_PROVIDER_SITE_OTHER): Payer: Medicare Other | Admitting: *Deleted

## 2018-12-07 DIAGNOSIS — I6300 Cerebral infarction due to thrombosis of unspecified precerebral artery: Secondary | ICD-10-CM

## 2018-12-08 LAB — CUP PACEART REMOTE DEVICE CHECK
Date Time Interrogation Session: 20201027204201
Implantable Pulse Generator Implant Date: 20200720

## 2018-12-28 NOTE — Progress Notes (Signed)
Carelink Summary Report / Loop Recorder 

## 2019-01-10 ENCOUNTER — Ambulatory Visit (INDEPENDENT_AMBULATORY_CARE_PROVIDER_SITE_OTHER): Payer: Medicare Other | Admitting: *Deleted

## 2019-01-10 DIAGNOSIS — I6300 Cerebral infarction due to thrombosis of unspecified precerebral artery: Secondary | ICD-10-CM | POA: Diagnosis not present

## 2019-01-10 LAB — CUP PACEART REMOTE DEVICE CHECK
Date Time Interrogation Session: 20201129154201
Implantable Pulse Generator Implant Date: 20200720

## 2019-01-22 ENCOUNTER — Encounter (HOSPITAL_COMMUNITY): Payer: Self-pay | Admitting: Emergency Medicine

## 2019-01-22 ENCOUNTER — Other Ambulatory Visit: Payer: Self-pay

## 2019-01-22 ENCOUNTER — Emergency Department (HOSPITAL_COMMUNITY)
Admission: EM | Admit: 2019-01-22 | Discharge: 2019-01-22 | Disposition: A | Discharge status: Home / Self Care | Payer: Medicare Other | Attending: Emergency Medicine | Admitting: Emergency Medicine

## 2019-01-22 DIAGNOSIS — Z7901 Long term (current) use of anticoagulants: Secondary | ICD-10-CM | POA: Diagnosis not present

## 2019-01-22 DIAGNOSIS — Z794 Long term (current) use of insulin: Secondary | ICD-10-CM | POA: Insufficient documentation

## 2019-01-22 DIAGNOSIS — E119 Type 2 diabetes mellitus without complications: Secondary | ICD-10-CM | POA: Insufficient documentation

## 2019-01-22 DIAGNOSIS — I10 Essential (primary) hypertension: Secondary | ICD-10-CM | POA: Insufficient documentation

## 2019-01-22 DIAGNOSIS — H6123 Impacted cerumen, bilateral: Secondary | ICD-10-CM | POA: Diagnosis not present

## 2019-01-22 DIAGNOSIS — L03031 Cellulitis of right toe: Secondary | ICD-10-CM | POA: Diagnosis not present

## 2019-01-22 DIAGNOSIS — H9201 Otalgia, right ear: Secondary | ICD-10-CM | POA: Diagnosis present

## 2019-01-22 DIAGNOSIS — Z8673 Personal history of transient ischemic attack (TIA), and cerebral infarction without residual deficits: Secondary | ICD-10-CM | POA: Insufficient documentation

## 2019-01-22 DIAGNOSIS — Z87891 Personal history of nicotine dependence: Secondary | ICD-10-CM | POA: Insufficient documentation

## 2019-01-22 DIAGNOSIS — Z7982 Long term (current) use of aspirin: Secondary | ICD-10-CM | POA: Insufficient documentation

## 2019-01-22 DIAGNOSIS — Z79899 Other long term (current) drug therapy: Secondary | ICD-10-CM | POA: Insufficient documentation

## 2019-01-22 MED ORDER — DOXYCYCLINE HYCLATE 100 MG PO CAPS: 100.0000 mg | ORAL_CAPSULE | Freq: Two times a day (BID) | ORAL | 0 refills | Status: DC

## 2019-01-22 MED ORDER — CARBAMIDE PEROXIDE 6.5 % OT SOLN: 5.0000 [drp] | Freq: Two times a day (BID) | OTIC | 0 refills | Status: DC

## 2019-01-22 MED ORDER — DOXYCYCLINE HYCLATE 100 MG PO TABS
100.0000 mg | ORAL_TABLET | Freq: Once | ORAL | Status: AC
  Administered 2019-01-22: 20:00:00 100 mg via ORAL
  Filled 2019-01-22: qty 1

## 2019-01-22 MED ORDER — CHLORHEXIDINE GLUCONATE 4 % EX LIQD: Freq: Two times a day (BID) | CUTANEOUS | 0 refills | Status: DC

## 2019-01-22 NOTE — ED Triage Notes (Signed)
Pt states R ear "clogged up" x 1 month.  Also reports pain to R 5th toe since this morning.  States it felt like nail lifted up while under the covers and that she had someone squeeze it and it has been draining.  Pt is diabetic.

## 2019-01-22 NOTE — Discharge Instructions (Signed)
Use Hibiclens solution on your toe to clean it twice daily.  Take antibiotics twice daily with food as directed to help prevent any worsening infection.  Monitor for any worsening such as increasing redness, swelling, drainage or worsening pain over the toe.  Use Debrox drops in both ears twice daily and follow-up with your PCP to have your ears rechecked.

## 2019-01-22 NOTE — ED Provider Notes (Signed)
[facility] EMERGENCY DEPARTMENT Provider Note   CSN: [id number] Arrival date & time: 01/22/19  1719     History Chief Complaint  Patient presents with  . Otalgia  . Toe Pain    Michelle Brooks is a 53 y.o. female.  Michelle Brooks is a 53 y.o. female with a history of diabetes, hypertension, hyperlipidemia, stroke, GERD, who presents to the ED for evaluation of decreased hearing and discomfort within the right ear as well as concern for right toe infection.  Patient states that over the past month she has felt like her right ear has been "clogged up".  She states she has tried to clean it out with Q-tips but now hearing is decreased in the right ear and she cannot use that ear when she is talking on the phone.  She denies any purulent drainage redness or swelling of the ear.  She has not had any fevers or chills.  She denies any problems with the left ear and has normal hearing.  No associated congestion or rhinorrhea.  Patient also reports that yesterday she noticed some pain along the lateral portion of the nail of her left fifth toe.  She had difficulty visualizing the area but pressed on it and felt like she got some liquid out of the area, she had her boyfriend assess the area and he was able to express a small amount of purulent drainage, since then she states that the pain seems to have improved but she was concerned given her diabetes and wanted to get it checked out.  She denies any associated fevers.  Aside from localized tenderness along the edge of the nail the toe is otherwise nonpainful and she has not noted any swelling or redness.  States her sugars have been well controlled between 90-130 recently.        Past Medical History:  Diagnosis Date  . Chronic back pain   . Diabetes mellitus type 2 in obese (HCC)   . Diabetic retinopathy (HCC)   . GERD (gastroesophageal reflux disease)   . Headache   . History of stroke 03/2010   Right MCA stroke in  03/2010, with MRI also noting subacute strokes in left fronto parietal area - occured in [place] received care at [facility] thought due to uncontrolled blood pressure // No residual deficits // TTE (03/2010) at [facility] - normal LV systolic function, moderate pericardial effusion with 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  . HSV-2 infection   . Hyperlipidemia LDL goal < 100 04/01/2011  . Hypertension   . Lumbago    pain in the back  . Stroke (HCC)     Patient Active Problem List   Diagnosis Date Noted  . CVA (cerebral vascular accident) (HCC) 08/27/2018  . Dehydration   . Vomiting in adult   . Right hemiparesis (HCC) 10/05/2014  . Diabetic neuropathy (HCC) 09/15/2014  . GERD (gastroesophageal reflux disease) 09/15/2014  . Stroke (HCC) 08/18/2014  . CVA (cerebral infarction) 08/18/2014  . Hot flashes 12/03/2012  . High risk sexual behavior 05/13/2012  . Chronic low back pain 12/29/2011  . Obesity 10/23/2011  . Financial difficulties 10/23/2011  . Poor dentition 10/23/2011  . Paresthesia of left arm and leg 10/23/2011  . Irregular periods/menstrual cycles 09/12/2011  . Preventative health care 05/13/2011  . Dyslipidemia 04/01/2011  . Hypertension 03/27/2011  . History of stroke 03/13/2010  . Type 2 diabetes mellitus with hyperlipidemia (  [place]) 02/11/2004    Past Surgical History:  Procedure Laterality Date  . BREAST LUMPECTOMY  2002   states benign lymph node removed.  . BUBBLE STUDY  08/30/2018   Procedure: BUBBLE STUDY;  Surgeon: Thayer Headings, MD;  Location: [facility];  Service: Cardiovascular;;  . LOOP RECORDER INSERTION N/A 08/30/2018   Procedure: LOOP RECORDER INSERTION;  Surgeon: Evans Lance, MD;  Location: [facility];  Service: Cardiovascular;  Laterality: N/A;  . TEE WITHOUT CARDIOVERSION N/A 08/30/2018   Procedure: TRANSESOPHAGEAL ECHOCARDIOGRAM (TEE);  Surgeon: Acie Fredrickson Wonda Cheng, MD;  Location: [facility]  ENDOSCOPY;  Service: Cardiovascular;  Laterality: N/A;     OB History    Gravida  5   Para  3   Term  3   Preterm      AB  2   Living        SAB      TAB  2   Ectopic      Multiple      Live Births              Family History  Problem Relation Age of Onset  . Diabetes Mother   . Stroke Mother 62  . Diabetes Brother   . Diabetes Brother   . Cancer Brother 2       unknown type  . Hypertension Brother     Social History   Tobacco Use  . Smoking status: Former Smoker    Packs/day: 0.20    Years: 7.00    Pack years: 1.40    Types: Cigarettes    Quit date: 03/26/2004    Years since quitting: 14.8  . Smokeless tobacco: Never Used  Substance Use Topics  . Alcohol use: No  . Drug use: No    Home Medications Prior to Admission medications   Medication Sig Start Date End Date Taking? Authorizing Provider  acyclovir (ZOVIRAX) 400 MG tablet Take 1 tablet (400 mg total) by mouth 3 (three) times daily. Patient taking differently: Take 400 mg by mouth 3 (three) times daily. Prn for flare up 07/16/15   Jorje Guild, NP  aspirin 81 MG tablet Take 81 mg by mouth daily.    [provider]  atorvastatin (LIPITOR) 40 MG tablet Take 1 tablet (40 mg total) by mouth daily at 6 PM. 08/30/18   Shelly Coss, MD  Blood Glucose Monitoring Suppl (ACCU-CHEK AVIVA PLUS) W/DEVICE KIT 1 kit by Does not apply route 3 (three) times daily before meals. 06/24/12   Kalia-Reynolds, Maitri S, DO  Blood Glucose Monitoring Suppl (TRUE METRIX METER) DEVI 1 each by Does not apply route 4 (four) times daily -  before meals and at bedtime. 09/15/14   Charlott Rakes, MD  clopidogrel (PLAVIX) 75 MG tablet Take 75 mg by mouth daily. 07/02/18   [provider]  diclofenac sodium (VOLTAREN) 1 % GEL Apply 2 g topically 4 (four) times daily as needed for pain. 03/29/18   [provider]  fluconazole (DIFLUCAN) 150 MG tablet Take 1 tablet (150 mg total) by mouth daily. 04/10/16    Jorje Guild, NP  gabapentin (NEURONTIN) 300 MG capsule Take 300 mg by mouth 3 (three) times daily.    [provider]  glucose blood (TRUE METRIX BLOOD GLUCOSE TEST) test strip Use as instructed 09/15/14   Charlott Rakes, MD  insulin lispro protamine-lispro (HUMALOG 50/50) (50-50) 100 UNIT/ML SUSP injection Inject 80 Units into the skin 3 (three) times daily. Up to 60 depending  on cbg    [provider]  JANUVIA 100 MG tablet Take 100 mg by mouth daily. 07/02/18   [provider]  lisinopril-hydrochlorothiazide (PRINZIDE,ZESTORETIC) 20-12.5 MG per tablet Take 1 tablet by mouth daily.    [provider]  meclizine (ANTIVERT) 25 MG tablet Take 1 tablet (25 mg total) by mouth 3 (three) times daily as needed for dizziness. 08/30/18   Adhikari, Amrit, MD  metFORMIN (GLUCOPHAGE) 500 MG tablet Take 500 mg by mouth 2 (two) times daily with a meal.    [provider]  nystatin-triamcinolone ointment (MYCOLOG) Apply 1 application topically 2 (two) times daily. 04/08/16   Lawrence, Erin, NP  ondansetron (ZOFRAN ODT) 4 MG disintegrating tablet Take 1 tablet (4 mg total) by mouth every 8 (eight) hours as needed for nausea or vomiting. 08/27/18   Goldston, Scott, MD  topiramate (TOPAMAX) 50 MG tablet Take 1 tablet (50 mg total) by mouth 2 (two) times daily. Start 1 tablet daily x 1 week then twice daily 03/23/17   Sethi, Pramod S, MD  TRUEPLUS LANCETS 28G MISC 1 each by Does not apply route 4 (four) times daily -  before meals and at bedtime. 09/18/14   Newlin, Enobong, MD    Allergies    Lactose intolerance (gi) and Latex  Review of Systems   Review of Systems  Constitutional: Negative for chills and fever.  HENT: Positive for ear pain and hearing loss. Negative for congestion, rhinorrhea and sore throat.   Respiratory: Negative for cough and shortness of breath.   Musculoskeletal: Negative for arthralgias and joint swelling.  Skin: Positive for color change and  wound.  Neurological: Negative for numbness.  All other systems reviewed and are negative.   Physical Exam Updated Vital Signs BP (!) 165/106 (BP Location: Right Arm)   Pulse 93   Temp 98.2 F (36.8 C) (Oral)   Resp 18   LMP 03/30/2016   SpO2 96%   Physical Exam Vitals and nursing note reviewed.  Constitutional:      General: She is not in acute distress.    Appearance: Normal appearance. She is well-developed. She is obese. She is not ill-appearing or diaphoretic.  HENT:     Head: Normocephalic and atraumatic.     Ears:     Comments: Bilateral ears with cerumen impaction noted, this is significantly worse on the right ear where patient reports a decreased hearing, TMs not visualized Eyes:     General:        Right eye: No discharge.        Left eye: No discharge.  Pulmonary:     Effort: Pulmonary effort is normal. No respiratory distress.  Musculoskeletal:     Comments: Right fifth toe with small wound along the lateral nail bed, appears to be a drained paronychia there is no surrounding cellulitis, no additional expressible drainage, toe is otherwise nontender to palpation, no swelling, digit pad is soft, no other wounds noted over the foot, 2+ distal pulses with good capillary refill and normal sensation.  Skin:    General: Skin is warm and dry.     Capillary Refill: Capillary refill takes less than 2 seconds.  Neurological:     Mental Status: She is alert and oriented to person, place, and time.     Coordination: Coordination normal.  Psychiatric:        Mood and Affect: Mood normal.        Behavior: Behavior normal.     ED Results /   Procedures / Treatments   Labs (all labs ordered are listed, but only abnormal results are displayed) Labs Reviewed - No data to display  EKG None  Radiology No results found.  Procedures .Ear Cerumen Removal  Date/Time: 01/22/2019 8:03 PM Performed by: Jacqlyn Larsen, PA-C Authorized by: Jacqlyn Larsen, PA-C   Consent:      Consent obtained:  Verbal   Consent given by:  Patient   Risks discussed:  Bleeding, infection, incomplete removal, dizziness, TM perforation and pain   Alternatives discussed:  No treatment Procedure details:    Location:  L ear and R ear (Right ear significantly worse than left)   Procedure type: curette   Post-procedure details:    Inspection:  TM intact and macerated skin   Hearing quality:  Improved   Patient tolerance of procedure:  Tolerated well, no immediate complications   (including critical care time)  Medications Ordered in ED Medications  doxycycline (VIBRA-TABS) tablet 100 mg (100 mg Oral Given 01/22/19 1938)    ED Course  I have reviewed the triage vital signs and the nursing notes.  Pertinent labs & imaging results that were available during my care of the patient were reviewed by me and considered in my medical decision making (see chart for details).   MDM Rules/Calculators/A&P  Cerumen Impation  Exam consistent with bilateral cerumen impaction, this was manually removed with curette and patient had immediate improvement in hearing, TMs intact.  Debrox drops prescribed and patient cautioned on avoiding Q-tip use.  Paronychia  Right fifth toe with evidence of small paronychia that appears to have already been drained, there is no additional expressible drainage no surrounding cellulitis, tenderness or swelling of the toe.  Given diabetes and high risk for infection will place on doxycycline and I have also prescribed chlorhexidine to have patient clean and wash the toe twice daily.    At this time there does not appear to be any evidence of an acute emergency medical condition and the patient appears stable for discharge with appropriate outpatient follow up.Diagnosis was discussed with patient who verbalizes understanding and is agreeable to discharge.   Final Clinical Impression(s) / ED Diagnoses Final diagnoses:  Bilateral impacted cerumen  Paronychia  of fifth toe of right foot    Rx / DC Orders ED Discharge Orders         Ordered    doxycycline (VIBRAMYCIN) 100 MG capsule  2 times daily,   Status:  Discontinued     01/22/19 2022    carbamide peroxide (DEBROX) 6.5 % OTIC solution  2 times daily     01/22/19 2022    chlorhexidine (HIBICLENS) 4 % external liquid  2 times daily     01/22/19 2022    doxycycline (VIBRAMYCIN) 100 MG capsule  2 times daily     01/22/19 2024           Jacqlyn Larsen, [place] 01/23/19 [place], Ankit, MD 01/23/19 1244

## 2019-02-02 NOTE — Progress Notes (Signed)
ILR remote 

## 2019-02-10 ENCOUNTER — Encounter: Payer: Self-pay | Admitting: Podiatry

## 2019-02-10 ENCOUNTER — Other Ambulatory Visit: Payer: Self-pay

## 2019-02-10 ENCOUNTER — Ambulatory Visit (INDEPENDENT_AMBULATORY_CARE_PROVIDER_SITE_OTHER): Payer: Medicare Other | Admitting: Podiatry

## 2019-02-10 ENCOUNTER — Ambulatory Visit (INDEPENDENT_AMBULATORY_CARE_PROVIDER_SITE_OTHER): Payer: Medicare Other | Admitting: *Deleted

## 2019-02-10 VITALS — BP 136/86 | HR 95 | Temp 96.1°F

## 2019-02-10 DIAGNOSIS — L600 Ingrowing nail: Secondary | ICD-10-CM

## 2019-02-10 DIAGNOSIS — L84 Corns and callosities: Secondary | ICD-10-CM | POA: Diagnosis not present

## 2019-02-10 DIAGNOSIS — I6300 Cerebral infarction due to thrombosis of unspecified precerebral artery: Secondary | ICD-10-CM

## 2019-02-10 DIAGNOSIS — M2041 Other hammer toe(s) (acquired), right foot: Secondary | ICD-10-CM

## 2019-02-10 LAB — CUP PACEART REMOTE DEVICE CHECK
Date Time Interrogation Session: 20201230231947
Implantable Pulse Generator Implant Date: 20200720

## 2019-02-10 NOTE — Progress Notes (Signed)
Subjective:   Patient ID: Michelle Brooks, female   DOB: 53 y.o.   MRN: [id number]   HPI Patient presents with a painful right fifth digit stating the nail is digging into the skin and that she is on antibiotics and states that it is very tender and hard for her to walk with and she tried to trim it herself.  Patient does not smoke likes to be active with diabetes under excellent control currently   Review of Systems  All other systems reviewed and are negative.       Objective:  Physical Exam Vitals and nursing note reviewed.  Constitutional:      Appearance: She is well-developed.  Pulmonary:     Effort: Pulmonary effort is normal.  Musculoskeletal:        General: Normal range of motion.  Skin:    General: Skin is warm.  Neurological:     Mental Status: She is alert.     Neurovascular status was noted to be intact muscle strength was found to be adequate range of motion within normal limits.  Patient does have a rotated fifth digit right with distal lateral irritation of tissue with what appears to be an incurvated nail bed with possibility of corn callus formation secondary to the structure of the toe.  Her A1c has been under 7 and sugar this morning was 130     Assessment:  Probability for ingrown toenail deformity right fifth toe lateral border with possibility also for corn formation secondary to structural changes of the digit     Plan:  H&P reviewed condition and recommended removal of the nail border explaining procedure risk and patient wants procedure signed consent form.  I infiltrated the right fifth digit 60 mg like Marcaine mixture sterile prep applied and using sterile instrumentation I remove the lateral nail fifth digit exposed matrix applied chemical agent 3 applications 30 seconds followed by alcohol lavage sterile dressing.  Gave instructions for soaks and reappoint and encouraged her to call with any questions concerns which may happen

## 2019-02-10 NOTE — Patient Instructions (Signed)

## 2019-03-14 ENCOUNTER — Ambulatory Visit (INDEPENDENT_AMBULATORY_CARE_PROVIDER_SITE_OTHER): Payer: Medicare Other | Admitting: *Deleted

## 2019-03-14 DIAGNOSIS — I6300 Cerebral infarction due to thrombosis of unspecified precerebral artery: Secondary | ICD-10-CM

## 2019-03-14 LAB — CUP PACEART REMOTE DEVICE CHECK
Date Time Interrogation Session: 20210201004543
Implantable Pulse Generator Implant Date: 20200720

## 2019-03-14 NOTE — Progress Notes (Signed)
ILR Remote 

## 2019-04-14 ENCOUNTER — Ambulatory Visit (INDEPENDENT_AMBULATORY_CARE_PROVIDER_SITE_OTHER): Payer: Medicare Other | Admitting: *Deleted

## 2019-04-14 DIAGNOSIS — I6300 Cerebral infarction due to thrombosis of unspecified precerebral artery: Secondary | ICD-10-CM | POA: Diagnosis not present

## 2019-04-14 LAB — CUP PACEART REMOTE DEVICE CHECK
Date Time Interrogation Session: 20210304031548
Implantable Pulse Generator Implant Date: 20200720

## 2019-04-14 NOTE — Progress Notes (Signed)
ILR Remote 

## 2019-05-16 ENCOUNTER — Ambulatory Visit (INDEPENDENT_AMBULATORY_CARE_PROVIDER_SITE_OTHER): Payer: Medicare Other | Admitting: *Deleted

## 2019-05-16 DIAGNOSIS — I6300 Cerebral infarction due to thrombosis of unspecified precerebral artery: Secondary | ICD-10-CM | POA: Diagnosis not present

## 2019-05-16 LAB — CUP PACEART REMOTE DEVICE CHECK
Date Time Interrogation Session: 20210404041751
Implantable Pulse Generator Implant Date: 20200720

## 2019-05-17 NOTE — Progress Notes (Signed)
ILR Remote 

## 2019-05-27 ENCOUNTER — Other Ambulatory Visit: Payer: Self-pay | Admitting: Internal Medicine

## 2019-05-27 DIAGNOSIS — N6320 Unspecified lump in the left breast, unspecified quadrant: Secondary | ICD-10-CM

## 2019-06-02 ENCOUNTER — Other Ambulatory Visit: Payer: Self-pay | Admitting: Internal Medicine

## 2019-06-02 DIAGNOSIS — E2839 Other primary ovarian failure: Secondary | ICD-10-CM

## 2019-06-16 LAB — CUP PACEART REMOTE DEVICE CHECK
Date Time Interrogation Session: 20210505042211
Implantable Pulse Generator Implant Date: 20200720

## 2019-06-20 ENCOUNTER — Ambulatory Visit (INDEPENDENT_AMBULATORY_CARE_PROVIDER_SITE_OTHER): Payer: Medicare Other | Admitting: *Deleted

## 2019-06-20 DIAGNOSIS — I6300 Cerebral infarction due to thrombosis of unspecified precerebral artery: Secondary | ICD-10-CM

## 2019-06-20 NOTE — Progress Notes (Signed)
Carelink Summary Report / Loop Recorder 

## 2019-07-17 LAB — CUP PACEART REMOTE DEVICE CHECK
Date Time Interrogation Session: 20210605042338
Implantable Pulse Generator Implant Date: 20200720

## 2019-07-18 ENCOUNTER — Ambulatory Visit (INDEPENDENT_AMBULATORY_CARE_PROVIDER_SITE_OTHER): Payer: Medicare Other | Admitting: *Deleted

## 2019-07-18 DIAGNOSIS — I6300 Cerebral infarction due to thrombosis of unspecified precerebral artery: Secondary | ICD-10-CM | POA: Diagnosis not present

## 2019-07-20 NOTE — Progress Notes (Signed)
Carelink Summary Report / Loop Recorder 

## 2019-08-01 ENCOUNTER — Inpatient Hospital Stay: Admission: RE | Admit: 2019-08-01 | Payer: Medicare Other | Source: Ambulatory Visit

## 2019-08-01 ENCOUNTER — Other Ambulatory Visit: Payer: Medicare Other

## 2019-08-22 ENCOUNTER — Ambulatory Visit
Admission: RE | Admit: 2019-08-22 | Discharge: 2019-08-22 | Disposition: A | Discharge status: Home / Self Care | Payer: Medicare Other | Source: Ambulatory Visit | Attending: Internal Medicine | Admitting: Internal Medicine

## 2019-08-22 ENCOUNTER — Ambulatory Visit (INDEPENDENT_AMBULATORY_CARE_PROVIDER_SITE_OTHER): Payer: Medicare Other | Admitting: *Deleted

## 2019-08-22 ENCOUNTER — Other Ambulatory Visit: Payer: Self-pay

## 2019-08-22 DIAGNOSIS — N6320 Unspecified lump in the left breast, unspecified quadrant: Secondary | ICD-10-CM

## 2019-08-22 DIAGNOSIS — I6300 Cerebral infarction due to thrombosis of unspecified precerebral artery: Secondary | ICD-10-CM

## 2019-08-22 LAB — CUP PACEART REMOTE DEVICE CHECK
Date Time Interrogation Session: 20210711233852
Implantable Pulse Generator Implant Date: 20200720

## 2019-08-23 NOTE — Progress Notes (Signed)
Carelink Summary Report / Loop Recorder 

## 2019-08-24 ENCOUNTER — Other Ambulatory Visit: Payer: Self-pay

## 2019-08-24 ENCOUNTER — Other Ambulatory Visit (HOSPITAL_COMMUNITY): Payer: Self-pay | Admitting: Internal Medicine

## 2019-08-24 ENCOUNTER — Ambulatory Visit (HOSPITAL_COMMUNITY)
Admission: RE | Admit: 2019-08-24 | Discharge: 2019-08-24 | Disposition: A | Discharge status: Home / Self Care | Payer: Medicare Other | Source: Ambulatory Visit | Attending: Surgery | Admitting: Surgery

## 2019-08-24 DIAGNOSIS — I739 Peripheral vascular disease, unspecified: Secondary | ICD-10-CM | POA: Diagnosis not present

## 2019-09-26 ENCOUNTER — Ambulatory Visit (INDEPENDENT_AMBULATORY_CARE_PROVIDER_SITE_OTHER): Payer: Medicare Other | Admitting: *Deleted

## 2019-09-26 DIAGNOSIS — I639 Cerebral infarction, unspecified: Secondary | ICD-10-CM | POA: Diagnosis not present

## 2019-09-26 LAB — CUP PACEART REMOTE DEVICE CHECK
Date Time Interrogation Session: 20210813233815
Implantable Pulse Generator Implant Date: 20200720

## 2019-09-27 NOTE — Progress Notes (Signed)
Carelink Summary Report / Loop Recorder 

## 2019-10-26 ENCOUNTER — Other Ambulatory Visit: Payer: Medicare Other

## 2019-10-31 ENCOUNTER — Ambulatory Visit (INDEPENDENT_AMBULATORY_CARE_PROVIDER_SITE_OTHER): Payer: Medicare Other | Admitting: *Deleted

## 2019-10-31 DIAGNOSIS — I6300 Cerebral infarction due to thrombosis of unspecified precerebral artery: Secondary | ICD-10-CM | POA: Diagnosis not present

## 2019-10-31 LAB — CUP PACEART REMOTE DEVICE CHECK
Date Time Interrogation Session: 20210915234959
Implantable Pulse Generator Implant Date: 20200720

## 2019-10-31 NOTE — Progress Notes (Signed)
Carelink Summary Report / Loop Recorder 

## 2019-11-29 LAB — CUP PACEART REMOTE DEVICE CHECK
Date Time Interrogation Session: 20211019003323
Implantable Pulse Generator Implant Date: 20200720

## 2019-12-05 ENCOUNTER — Ambulatory Visit (INDEPENDENT_AMBULATORY_CARE_PROVIDER_SITE_OTHER): Payer: Medicare Other

## 2019-12-05 DIAGNOSIS — I6300 Cerebral infarction due to thrombosis of unspecified precerebral artery: Secondary | ICD-10-CM | POA: Diagnosis not present

## 2019-12-08 NOTE — Progress Notes (Signed)
Carelink Summary Report / Loop Recorder 

## 2020-01-06 LAB — CUP PACEART REMOTE DEVICE CHECK
Date Time Interrogation Session: 20211121000125
Implantable Pulse Generator Implant Date: 20200720

## 2020-01-09 ENCOUNTER — Ambulatory Visit (INDEPENDENT_AMBULATORY_CARE_PROVIDER_SITE_OTHER): Payer: Medicare Other

## 2020-01-09 DIAGNOSIS — I6300 Cerebral infarction due to thrombosis of unspecified precerebral artery: Secondary | ICD-10-CM

## 2020-01-13 NOTE — Progress Notes (Signed)
Carelink Summary Report / Loop Recorder 

## 2020-02-13 ENCOUNTER — Ambulatory Visit (INDEPENDENT_AMBULATORY_CARE_PROVIDER_SITE_OTHER): Payer: Medicare Other

## 2020-02-13 DIAGNOSIS — I6300 Cerebral infarction due to thrombosis of unspecified precerebral artery: Secondary | ICD-10-CM | POA: Diagnosis not present

## 2020-02-14 LAB — CUP PACEART REMOTE DEVICE CHECK
Date Time Interrogation Session: 20220101233038
Implantable Pulse Generator Implant Date: 20200720

## 2020-02-27 NOTE — Progress Notes (Signed)
Carelink Summary Report / Loop Recorder 

## 2020-03-16 LAB — CUP PACEART REMOTE DEVICE CHECK
Date Time Interrogation Session: 20220203233020
Implantable Pulse Generator Implant Date: 20200720

## 2020-03-19 ENCOUNTER — Ambulatory Visit (INDEPENDENT_AMBULATORY_CARE_PROVIDER_SITE_OTHER): Payer: Medicare Other

## 2020-03-19 DIAGNOSIS — I6300 Cerebral infarction due to thrombosis of unspecified precerebral artery: Secondary | ICD-10-CM

## 2020-03-23 NOTE — Progress Notes (Signed)
Carelink Summary Report / Loop Recorder 

## 2020-04-23 ENCOUNTER — Ambulatory Visit (INDEPENDENT_AMBULATORY_CARE_PROVIDER_SITE_OTHER): Payer: Medicare Other

## 2020-04-23 DIAGNOSIS — I639 Cerebral infarction, unspecified: Secondary | ICD-10-CM

## 2020-04-25 LAB — CUP PACEART REMOTE DEVICE CHECK
Date Time Interrogation Session: 20220309004928
Implantable Pulse Generator Implant Date: 20200720

## 2020-05-01 NOTE — Progress Notes (Signed)
Carelink Summary Report / Loop Recorder 

## 2020-05-28 ENCOUNTER — Ambulatory Visit (INDEPENDENT_AMBULATORY_CARE_PROVIDER_SITE_OTHER): Payer: Medicare Other

## 2020-05-28 DIAGNOSIS — I6300 Cerebral infarction due to thrombosis of unspecified precerebral artery: Secondary | ICD-10-CM | POA: Diagnosis not present

## 2020-05-29 LAB — CUP PACEART REMOTE DEVICE CHECK
Date Time Interrogation Session: 20220411015221
Implantable Pulse Generator Implant Date: 20200720

## 2020-06-13 NOTE — Progress Notes (Signed)
Carelink Summary Report / Loop Recorder 

## 2020-07-02 ENCOUNTER — Ambulatory Visit (INDEPENDENT_AMBULATORY_CARE_PROVIDER_SITE_OTHER): Payer: Medicare Other

## 2020-07-02 DIAGNOSIS — I6300 Cerebral infarction due to thrombosis of unspecified precerebral artery: Secondary | ICD-10-CM | POA: Diagnosis not present

## 2020-07-05 LAB — CUP PACEART REMOTE DEVICE CHECK
Date Time Interrogation Session: 20220514015313
Implantable Pulse Generator Implant Date: 20200720

## 2020-07-24 NOTE — Progress Notes (Signed)
Carelink Summary Report / Loop Recorder 

## 2020-07-30 ENCOUNTER — Ambulatory Visit (INDEPENDENT_AMBULATORY_CARE_PROVIDER_SITE_OTHER): Payer: Medicare Other

## 2020-07-30 DIAGNOSIS — I6300 Cerebral infarction due to thrombosis of unspecified precerebral artery: Secondary | ICD-10-CM

## 2020-07-31 LAB — CUP PACEART REMOTE DEVICE CHECK
Date Time Interrogation Session: 20220616015718
Implantable Pulse Generator Implant Date: 20200720

## 2020-08-17 NOTE — Progress Notes (Signed)
Carelink Summary Report / Loop Recorder 

## 2020-08-30 LAB — CUP PACEART REMOTE DEVICE CHECK
Date Time Interrogation Session: 20220719015940
Implantable Pulse Generator Implant Date: 20200720

## 2020-09-03 ENCOUNTER — Ambulatory Visit (INDEPENDENT_AMBULATORY_CARE_PROVIDER_SITE_OTHER): Payer: Medicare Other

## 2020-09-03 DIAGNOSIS — I6300 Cerebral infarction due to thrombosis of unspecified precerebral artery: Secondary | ICD-10-CM

## 2020-09-27 NOTE — Progress Notes (Signed)
Carelink Summary Report / Loop Recorder 

## 2020-10-04 ENCOUNTER — Other Ambulatory Visit (HOSPITAL_COMMUNITY)
Admission: RE | Admit: 2020-10-04 | Discharge: 2020-10-04 | Disposition: A | Discharge status: Home / Self Care | Payer: Medicare Other | Source: Ambulatory Visit | Attending: Obstetrics and Gynecology | Admitting: Obstetrics and Gynecology

## 2020-10-04 ENCOUNTER — Encounter: Payer: Self-pay | Admitting: Obstetrics and Gynecology

## 2020-10-04 ENCOUNTER — Other Ambulatory Visit: Payer: Self-pay | Admitting: Obstetrics and Gynecology

## 2020-10-04 ENCOUNTER — Ambulatory Visit (INDEPENDENT_AMBULATORY_CARE_PROVIDER_SITE_OTHER): Payer: Medicare Other | Admitting: Obstetrics and Gynecology

## 2020-10-04 ENCOUNTER — Other Ambulatory Visit: Payer: Self-pay

## 2020-10-04 VITALS — BP 169/86 | HR 88 | Wt 251.8 lb

## 2020-10-04 DIAGNOSIS — N761 Subacute and chronic vaginitis: Secondary | ICD-10-CM | POA: Diagnosis not present

## 2020-10-04 DIAGNOSIS — Z8619 Personal history of other infectious and parasitic diseases: Secondary | ICD-10-CM

## 2020-10-04 DIAGNOSIS — N939 Abnormal uterine and vaginal bleeding, unspecified: Secondary | ICD-10-CM | POA: Insufficient documentation

## 2020-10-04 DIAGNOSIS — Z1151 Encounter for screening for human papillomavirus (HPV): Secondary | ICD-10-CM | POA: Diagnosis not present

## 2020-10-04 DIAGNOSIS — Z1231 Encounter for screening mammogram for malignant neoplasm of breast: Secondary | ICD-10-CM

## 2020-10-04 DIAGNOSIS — Z01419 Encounter for gynecological examination (general) (routine) without abnormal findings: Secondary | ICD-10-CM

## 2020-10-04 DIAGNOSIS — R112 Nausea with vomiting, unspecified: Secondary | ICD-10-CM | POA: Diagnosis not present

## 2020-10-04 HISTORY — PX: ENDOMETRIAL BIOPSY: PRO73

## 2020-10-04 NOTE — Procedures (Addendum)
Endometrial Biopsy Procedure Note  Pre-operative Diagnosis: postmenopausal bleeding  Post-operative Diagnosis: same  Procedure Details  Urine pregnancy test was not done.  The risks (including infection, bleeding, pain, and uterine perforation) and benefits of the procedure were explained to the patient and Written informed consent was obtained.  The patient was placed in the dorsal lithotomy position.  Bimanual exam showed the uterus to be in the neutral position.  A Graves' speculum inserted in the vagina, and the cervix was visualized and a cervical swab and pap smear performed. The cervix was then prepped with povidone iodine, and a sharp tenaculum was applied to the anterior lip of the cervix for stabilization.  A pipelle was inserted (I was able to get in on the far right edge of the os) into the uterine cavity and sounded the uterus to a depth of 7cm.  A Minimal amount of tissue was collected after 3 passes. The sample was sent for pathologic examination.  Condition: Stable  Complications: None  Plan: The patient was advised to call for any fever or for prolonged or severe pain or bleeding. She was advised to use OTC analgesics as needed for mild to moderate pain. She was advised to avoid vaginal intercourse for 48 hours or until the bleeding has completely stopped.  Cornelia Copa MD Attending Center for [employer] [facility])

## 2020-10-04 NOTE — Progress Notes (Signed)
Obstetrics and Gynecology New Patient Evaluation  Appointment Date: 10/04/2020  OBGYN Clinic: [facility]  Primary Care Provider: Nolene Ebbs  Referring Provider: Nolene Ebbs, MD  Chief Complaint: blood in toilet after straining, with wiping for the past few days   History of Present Illness: Michelle Brooks is a 55 y.o. African-American G5P3 (Patient's last menstrual period was 03/30/2016.), seen for the above chief complaint. Her past medical history is significant for h/o HPV, h/o hsv, h/o CVA, HTN, pacemaker, DM2, insufficient care, h/o HPV   Patient describes vaginal discomfort as feeling as something is there, "like a bubble". She denies pain or abnormal discharge. Bleeding (bright red) started two days ago after going to a convention where she ate grilled chicken and salad and she first noticed with BM and has had spotting since then.  No prior s/s. She usually does not eat outside of her home and says this is the only meal outside of her regular diet that has changed. She has associated diffuse abdominal cramping and nausea and vomiting and states the only thing that has helped is drinking some diet ginger ale. Patient mentions she has a history of lactose intolerance and states the cramping is similar to what she experiences when she eats too much dairy.   She did not take her BP medications today  Review of Systems: Pertinent items noted in HPI and remainder of comprehensive ROS otherwise negative.   Patient Active Problem List   Diagnosis Date Noted   CVA (cerebral vascular accident) ([place]) 08/27/2018   Dehydration    Vomiting in adult    Right hemiparesis ([place]) 10/05/2014   Diabetic neuropathy ([place]) 09/15/2014   GERD (gastroesophageal reflux disease) 09/15/2014   Stroke ([place]) 08/18/2014   CVA (cerebral infarction) 08/18/2014   Hot flashes 12/03/2012   High risk sexual behavior 05/13/2012   Chronic low back pain 12/29/2011    Obesity 10/23/2011   Financial difficulties 10/23/2011   Poor dentition 10/23/2011   Paresthesia of left arm and leg 10/23/2011   Preventative health care 05/13/2011   Dyslipidemia 04/01/2011   Hypertension 03/27/2011   History of stroke 03/13/2010   Type 2 diabetes mellitus with hyperlipidemia ([place]) 02/11/2004    Past Medical History:  Past Medical History:  Diagnosis Date   Chronic back pain    Diabetes mellitus type 2 in obese ([place])    Diabetic retinopathy ([place])    GERD (gastroesophageal reflux disease)    Headache    History of stroke 03/2010   Right MCA stroke in 03/2010, with MRI also noting subacute strokes in left fronto parietal area - occured in [place] received care [facility] due to uncontrolled blood pressure // No residual deficits // TTE (03/2010) at [facility] - normal LV systolic function, moderate pericardial effusion with 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   HSV-2 infection    Hyperlipidemia LDL goal < 100 04/01/2011   Hypertension    Lumbago    pain in the back    Past Surgical History:  Past Surgical History:  Procedure Laterality Date   BREAST LUMPECTOMY  2002   states benign lymph node removed.   BUBBLE STUDY  08/30/2018   Procedure: BUBBLE STUDY;  Surgeon: Thayer Headings, MD;  Location: [place];  Service: Cardiovascular;;   ENDOMETRIAL BIOPSY  10/04/2020   LOOP RECORDER INSERTION N/A 08/30/2018   Procedure: LOOP RECORDER INSERTION;  Surgeon: Evans Lance,  MD;  Location: [place] [facility];  Service: Cardiovascular;  Laterality: N/A;   TEE WITHOUT CARDIOVERSION N/A 08/30/2018   Procedure: TRANSESOPHAGEAL ECHOCARDIOGRAM (TEE);  Surgeon: Acie Fredrickson Wonda Cheng, MD;  Location: [facility] ENDOSCOPY;  Service: Cardiovascular;  Laterality: N/A;    Past Obstetrical History:  OB History  Gravida Para Term Preterm AB Living  _0 SAB IAB Ectopic Multiple Live Births    2          #  Outcome Date GA Lbr Len/2nd Weight Sex Delivery Anes PTL Lv  5 Term 1996     Vag-Spont     4 Term 64     Vag-Spont     3 Term 31     Vag-Spont     2 IAB           1 IAB             Past Gynecological History: As per HPI. History of Pap Smear(s): Yes.   Last pap 2018, which was negative cytology, hpv neg, no subtyping done History of HRT use: No.  Social History:  Social History   Socioeconomic History   Marital status: Divorced    Spouse name: Not on file   Number of children: 3   Years of education: 12th grade   Highest education level: Not on file  Occupational History   Occupation: unemployed    Comment: previously worked in [place] before her stroke  Tobacco Use   Smoking status: Former    Packs/day: 0.20    Years: 7.00    Pack years: 1.40    Types: Cigarettes    Quit date: 03/26/2004    Years since quitting: 16.5   Smokeless tobacco: Never  Substance and Sexual Activity   Alcohol use: No   Drug use: No   Sexual activity: Yes    Partners: Male    Birth control/protection: Condom  Other Topics Concern   Not on file  Social History Narrative   Recently moved from [place] in 10/2010 due to wanting a change from stressors.   Previously followed by Dr. Wynelle Bourgeois at [place] Strain: Not on file  Food Insecurity: Not on file  Transportation Needs: Not on file  Physical Activity: Not on file  Stress: Not on file  Social Connections: Not on file  Intimate Partner Violence: Not on file    Family History:  Family History  Problem Relation Age of Onset   Diabetes Mother    Stroke Mother 55   Diabetes Brother    Diabetes Brother    Cancer Brother 62       unknown type   Hypertension Brother     Health Maintenance:  Mammogram(s): Yes.   Date: 08/2019, negative Colon cancer screening: Yes.   Date: 2 years ago via what sounds like Cologard; per pt it was negative  Medications Abrielle Finck. Carlota Raspberry had no  medications administered during this visit. Current Outpatient Medications  Medication Sig Dispense Refill   aspirin 81 MG tablet Take 81 mg by mouth daily.     Blood Glucose Monitoring Suppl (ACCU-CHEK AVIVA PLUS) W/DEVICE KIT 1 kit by Does not apply route 3 (three) times daily before meals. 1 kit 0   Blood Glucose Monitoring Suppl (TRUE METRIX METER) DEVI 1 each by Does not apply route 4 (four) times daily -  before meals and at  bedtime. 1 Device 0   glucose blood (TRUE METRIX BLOOD GLUCOSE TEST) test strip Use as instructed 100 each 5   insulin lispro protamine-lispro (HUMALOG 50/50) (50-50) 100 UNIT/ML SUSP injection Inject 80 Units into the skin 3 (three) times daily. Up to 60 depending on cbg     JANUVIA 100 MG tablet Take 100 mg by mouth daily.     lisinopril-hydrochlorothiazide (PRINZIDE,ZESTORETIC) 20-12.5 MG per tablet Take 1 tablet by mouth daily.     meclizine (ANTIVERT) 25 MG tablet Take 1 tablet (25 mg total) by mouth 3 (three) times daily as needed for dizziness. 30 tablet 0   nystatin-triamcinolone ointment (MYCOLOG) Apply 1 application topically 2 (two) times daily. 30 g 0   ondansetron (ZOFRAN ODT) 4 MG disintegrating tablet Take 1 tablet (4 mg total) by mouth every 8 (eight) hours as needed for nausea or vomiting. 10 tablet 0   pravastatin (PRAVACHOL) 40 MG tablet Take 40 mg by mouth at bedtime.     topiramate (TOPAMAX) 50 MG tablet Take 1 tablet (50 mg total) by mouth 2 (two) times daily. Start 1 tablet daily x 1 week then twice daily 60 tablet 3   TRUEPLUS LANCETS 28G MISC 1 each by Does not apply route 4 (four) times daily -  before meals and at bedtime. 100 each 5   acyclovir (ZOVIRAX) 400 MG tablet Take 1 tablet (400 mg total) by mouth 3 (three) times daily. (Patient not taking: Reported on 10/04/2020) 21 tablet 1   atorvastatin (LIPITOR) 40 MG tablet Take 1 tablet (40 mg total) by mouth daily at 6 PM. (Patient not taking: Reported on 10/04/2020) 30 tablet 0   carbamide  peroxide (DEBROX) 6.5 % OTIC solution Place 5 drops into both ears 2 (two) times daily. 15 mL 0   cephALEXin (KEFLEX) 500 MG capsule Take 500 mg by mouth 4 (four) times daily.     chlorhexidine (HIBICLENS) 4 % external liquid Apply topically 2 (two) times daily. Apply topically to clean toe twice daily 120 mL 0   clopidogrel (PLAVIX) 75 MG tablet Take 75 mg by mouth daily.     diclofenac sodium (VOLTAREN) 1 % GEL Apply 2 g topically 4 (four) times daily as needed for pain.     doxycycline (VIBRAMYCIN) 100 MG capsule Take 1 capsule (100 mg total) by mouth 2 (two) times daily. One po bid x 7 days 14 capsule 0   fluconazole (DIFLUCAN) 150 MG tablet Take 1 tablet (150 mg total) by mouth daily. 1 tablet 1   gabapentin (NEURONTIN) 300 MG capsule Take 300 mg by mouth 3 (three) times daily.     metFORMIN (GLUCOPHAGE) 500 MG tablet Take 500 mg by mouth 2 (two) times daily with a meal.     tiZANidine (ZANAFLEX) 4 MG tablet Take 4 mg by mouth 2 (two) times daily as needed. (Patient not taking: Reported on 10/04/2020)     No current facility-administered medications for this visit.    Allergies Lactose intolerance (gi) and Latex   Physical Exam:  BP (!) 169/86   Pulse 88   Wt 251 lb 12.8 oz (114.2 kg)   LMP 03/30/2016   BMI 38.29 kg/m  Body mass index is 38.29 kg/m. General appearance: Well nourished, well developed female in no acute distress.  Cardiovascular: normal s1 and s2.  No murmurs, rubs or gallops. Respiratory:  Clear to auscultation bilateral. Normal respiratory effort Abdomen: positive bowel sounds and no masses, hernias; diffusely non tender to palpation, non distended  Breasts: breasts appear normal, no suspicious masses, no skin or nipple changes or axillary nodes, and normal palpation. Neuro/Psych:  Normal mood and affect.  Skin:  Warm and dry.  Lymphatic:  No inguinal lymphadenopathy.   Pelvic exam: is limited by body habitus EGBUS: within normal limits Vagina: within normal  limits and with no blood or discharge in the vault Cervix: large, patulous, normal appearing cervix without tenderness, discharge or lesions. Uterus:  no suprapubic tenderness, no masses Adnexa:  normal adnexa and no mass, fullness, tenderness Rectovaginal: deferred  See procedure note for pap, embx  Laboratory: none  Radiology: none  Assessment: pt stabld  Plan:  1. History of HPV infection - Cytology - PAP( [place]) - Cervicovaginal ancillary only( [place])  2. Well woman exam Scheduled for mammogram - Cytology - PAP( [place]) - MM Digital Screening; Future - Cervicovaginal ancillary only( [place]) - Ambulatory referral to Gastroenterology  3. ?Vaginal bleeding D/w her recommend eval urinary, gyn and GI. Will get ucx and u/a. Pap, embx, sti swab done and pelvic u/s ordered. Referral to GI for possible colonoscopy made - Cytology - PAP( [place]) - MM Digital Screening; Future - Cervicovaginal ancillary only( [place]) - Ambulatory referral to Gastroenterology - Surgical pathology( [place]/ [facility]) - US PELVIC COMPLETE WITH TRANSVAGINAL; Future - Urinalysis, Routine w reflex microscopic - Urine Culture  4. Chronic vaginitis  5. Non-intractable vomiting with nausea, unspecified vomiting type ?gastroenteritis. Exp management  RTC after u/s results  Durene Romans MD Attending [facility])

## 2020-10-05 LAB — URINALYSIS, ROUTINE W REFLEX MICROSCOPIC
Bilirubin, UA: NEGATIVE
Glucose, UA: NEGATIVE
Ketones, UA: NEGATIVE
Nitrite, UA: NEGATIVE
Specific Gravity, UA: 1.023 (ref 1.005–1.030)
Urobilinogen, Ur: 0.2 mg/dL (ref 0.2–1.0)
pH, UA: 5.5 (ref 5.0–7.5)

## 2020-10-05 LAB — CERVICOVAGINAL ANCILLARY ONLY
Bacterial Vaginitis (gardnerella): POSITIVE — AB
Candida Glabrata: NEGATIVE
Candida Vaginitis: NEGATIVE
Chlamydia: NEGATIVE
Comment: NEGATIVE
Comment: NEGATIVE
Comment: NEGATIVE
Comment: NEGATIVE
Comment: NEGATIVE
Comment: NORMAL
Neisseria Gonorrhea: NEGATIVE
Trichomonas: NEGATIVE

## 2020-10-05 LAB — MICROSCOPIC EXAMINATION
Casts: NONE SEEN /lpf
Epithelial Cells (non renal): >10 /hpf — AB (ref 0–10)

## 2020-10-06 LAB — URINE CULTURE

## 2020-10-08 ENCOUNTER — Ambulatory Visit (INDEPENDENT_AMBULATORY_CARE_PROVIDER_SITE_OTHER): Payer: Medicare Other

## 2020-10-08 ENCOUNTER — Encounter: Payer: Self-pay | Admitting: Obstetrics and Gynecology

## 2020-10-08 DIAGNOSIS — I6300 Cerebral infarction due to thrombosis of unspecified precerebral artery: Secondary | ICD-10-CM | POA: Diagnosis not present

## 2020-10-08 DIAGNOSIS — R319 Hematuria, unspecified: Secondary | ICD-10-CM | POA: Insufficient documentation

## 2020-10-08 LAB — CYTOLOGY - PAP
Comment: NEGATIVE
Diagnosis: NEGATIVE
High risk HPV: NEGATIVE

## 2020-10-08 MED ORDER — METRONIDAZOLE 500 MG PO TABS: 500.0000 mg | ORAL_TABLET | Freq: Two times a day (BID) | ORAL | 0 refills | Status: AC

## 2020-10-08 NOTE — Addendum Note (Signed)
Addended by: Oxford Bing on: 10/08/2020 10:16 AM   Modules accepted: Orders

## 2020-10-09 LAB — CUP PACEART REMOTE DEVICE CHECK
Date Time Interrogation Session: 20220821020355
Implantable Pulse Generator Implant Date: 20200720

## 2020-10-11 ENCOUNTER — Ambulatory Visit
Admission: RE | Admit: 2020-10-11 | Discharge: 2020-10-11 | Disposition: A | Discharge status: Home / Self Care | Payer: Medicare Other | Source: Ambulatory Visit | Attending: Obstetrics and Gynecology | Admitting: Obstetrics and Gynecology

## 2020-10-11 ENCOUNTER — Other Ambulatory Visit: Payer: Self-pay

## 2020-10-11 DIAGNOSIS — Z8619 Personal history of other infectious and parasitic diseases: Secondary | ICD-10-CM

## 2020-10-11 DIAGNOSIS — N939 Abnormal uterine and vaginal bleeding, unspecified: Secondary | ICD-10-CM | POA: Diagnosis present

## 2020-10-11 LAB — SURGICAL PATHOLOGY

## 2020-10-13 ENCOUNTER — Other Ambulatory Visit: Payer: Self-pay

## 2020-10-13 ENCOUNTER — Ambulatory Visit
Admission: RE | Admit: 2020-10-13 | Discharge: 2020-10-13 | Disposition: A | Discharge status: Home / Self Care | Payer: Medicare Other | Source: Ambulatory Visit | Attending: Obstetrics and Gynecology | Admitting: Obstetrics and Gynecology

## 2020-10-13 DIAGNOSIS — Z1231 Encounter for screening mammogram for malignant neoplasm of breast: Secondary | ICD-10-CM

## 2020-10-17 ENCOUNTER — Other Ambulatory Visit: Payer: Self-pay | Admitting: Obstetrics and Gynecology

## 2020-10-17 DIAGNOSIS — R928 Other abnormal and inconclusive findings on diagnostic imaging of breast: Secondary | ICD-10-CM

## 2020-10-19 NOTE — Progress Notes (Signed)
Carelink Summary Report / Loop Recorder 

## 2020-10-31 ENCOUNTER — Other Ambulatory Visit: Payer: Self-pay

## 2020-10-31 ENCOUNTER — Telehealth: Payer: Self-pay | Admitting: Adult Health

## 2020-10-31 ENCOUNTER — Ambulatory Visit (INDEPENDENT_AMBULATORY_CARE_PROVIDER_SITE_OTHER): Payer: Medicare Other | Admitting: Adult Health

## 2020-10-31 ENCOUNTER — Encounter: Payer: Self-pay | Admitting: Adult Health

## 2020-10-31 VITALS — BP 132/78 | HR 75 | Ht 68.0 in | Wt 252.0 lb

## 2020-10-31 DIAGNOSIS — R4789 Other speech disturbances: Secondary | ICD-10-CM | POA: Diagnosis not present

## 2020-10-31 DIAGNOSIS — E785 Hyperlipidemia, unspecified: Secondary | ICD-10-CM

## 2020-10-31 DIAGNOSIS — I679 Cerebrovascular disease, unspecified: Secondary | ICD-10-CM

## 2020-10-31 DIAGNOSIS — E1169 Type 2 diabetes mellitus with other specified complication: Secondary | ICD-10-CM | POA: Diagnosis not present

## 2020-10-31 DIAGNOSIS — R413 Other amnesia: Secondary | ICD-10-CM | POA: Diagnosis not present

## 2020-10-31 DIAGNOSIS — I6523 Occlusion and stenosis of bilateral carotid arteries: Secondary | ICD-10-CM

## 2020-10-31 DIAGNOSIS — Z8673 Personal history of transient ischemic attack (TIA), and cerebral infarction without residual deficits: Secondary | ICD-10-CM | POA: Diagnosis not present

## 2020-10-31 NOTE — Patient Instructions (Addendum)
We will obtain an MRI brain with contrast due to issues with memory and speech difficulties   We will check carotid ultrasound - you will be called to schedule   We will check lab work today to look for reversible causes of memory issues  Would recommend evaluation by retina specialist - can look into [facility] at [phone]  Continue aspirin 81 mg daily  and pravastatin 40mg  daily  for secondary stroke prevention  Continue to follow up with PCP regarding cholesterol, blood pressure and diabetes management  Maintain strict control of hypertension with blood pressure goal below 130/90, diabetes with hemoglobin A1c goal below 7% and cholesterol with LDL cholesterol (bad cholesterol) goal below 70 mg/dL.       Followup in the future with me in 3 months or call earlier if needed       Thank you for coming to see at [facility]. I hope we have been able to provide you high quality care today.  You may receive a patient satisfaction survey over the next few weeks. We would appreciate your feedback and comments so that we may continue to improve ourselves and the health of our patients.

## 2020-10-31 NOTE — Progress Notes (Signed)
[facility] [address] [place]. [place] [address] [phone]       STROKE FOLLOW UP NOTE  [name] Date of Birth:  04/15/1965 Medical Record Number:  [id number]   Reason for Referral: stroke follow up    CHIEF COMPLAINT:  Chief Complaint  Patient presents with   Follow-up    Rm 2 here for f/u on Stroke. Pt reports history of stroke and would like to repeat MRI study to compare MRI scans. Reports she is having trouble with word finding, weakness, denies any present issues with her vision. Reports she is due for recheck for glasses prescription.      HPI:   Michelle Brooks is a 55-year-old female with PMHx pertinent for history of multiple strokes, diabetes, hypertension, hyperlipidemia, headaches, and chronic back pain.  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  Her PCP, Dr. Avbuere, referred her back to this office to reestablish care and stroke follow-up. But apparently she has been having issues with her speech and memory (counting $20 bills but counting by 10s instead of 20s which is "not like her"). This seemed to worsen when she was started on doxycycline in August and has been since improving since completing after 2 weeks.reports right side drooling occasionally.  Also reports occasional headache over the left eye worsening after eyestrain such as watching TV or trying to read.  She does believe her vision has decreased.  Did have an MRI brain 08/16/2020 for visual changes and floaters which did not show any acute abnormalities - she was seen by ophthalmology and was told it was likely due to "old age" which she does not agree with.  She is also frustrated that the MRI completed in July did not have contrast especially with her history of multiple recurrent strokes.  Compliant on aspirin and atorvastatin 40 mg  daily without side effects.  Blood pressure today 132/78.  Loop recorder has not shown atrial fibrillation thus far initially implanted 08/2018.  Reports difficulty controlling glucose levels currently managed by PCP.  Recent A1c 9.0 on 10/12/2018  No further concerns at this time    ROS:   14 system review of systems performed and negative with exception of see HPI  PMH:  Past Medical History:  Diagnosis Date   Chronic back pain    Diabetes mellitus type 2 in obese (HCC)    Diabetic retinopathy (HCC)    GERD (gastroesophageal reflux disease)    Headache    History of stroke 03/2010   Right MCA stroke in 03/2010, with MRI also noting subacute strokes in left fronto parietal area - occured in [place] received care at [facility] thought due to uncontrolled blood pressure // No residual deficits // TTE (03/2010) at [facility] - normal LV systolic function, moderate pericardial effusion with 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   HSV-2 infection    Hyperlipidemia LDL goal < 100 04/01/2011   Hypertension    Lumbago    pain in the back    PSH:  Past Surgical History:  Procedure Laterality Date   BREAST LUMPECTOMY  2002   states benign lymph node removed.   BUBBLE STUDY  08/30/2018   Procedure: BUBBLE STUDY;  Surgeon: Nahser, Philip J, MD;  Location: [facility];  Service: Cardiovascular;;   ENDOMETRIAL BIOPSY    10/04/2020   LOOP RECORDER INSERTION N/A 08/30/2018   Procedure: LOOP RECORDER INSERTION;  Surgeon: Taylor, Gregg W, MD;  Location: [facility]ASIVE [facility];  Service: Cardiovascular;  Laterality: N/A;   TEE WITHOUT CARDIOVERSION N/A 08/30/2018   Procedure: TRANSESOPHAGEAL ECHOCARDIOGRAM (TEE);  Surgeon: Nahser, Philip J, MD;  Location: [facility];  Service: Cardiovascular;  Laterality: N/A;    Social History:  Social History   Socioeconomic History   Marital status: Divorced    Spouse name: Not on file   Number of children: 3    Years of education: 12th grade   Highest education level: Not on file  Occupational History   Occupation: unemployed    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   Previously followed by Dr. Ganesh at [facility]         Social Determinants of Health   Financial Resource Strain: Not on file  Food Insecurity: Not on file  Transportation Needs: Not on file  Physical Activity: Not on file  Stress: Not on file  Social Connections: Not on file  Intimate Partner Violence: Not on file    Family History:  Family History  Problem Relation Age of Onset   Diabetes Mother    Stroke Mother 51   Diabetes Brother    Diabetes Brother    Cancer Brother 44       unknown type   Hypertension Brother     Medications:   Current Outpatient Medications on File Prior to Visit  Medication Sig Dispense Refill   aspirin 81 MG tablet Take 81 mg by mouth daily.     atorvastatin (LIPITOR) 40 MG tablet Take 1 tablet (40 mg total) by mouth daily at 6 PM. (Patient not taking: Reported on 10/04/2020) 30 tablet 0   Blood Glucose Monitoring Suppl (ACCU-CHEK AVIVA PLUS) W/DEVICE KIT 1 kit by Does not apply route 3 (three) times daily before meals. 1 kit 0   Blood Glucose Monitoring Suppl (TRUE METRIX METER) DEVI 1 each by Does not apply route 4 (four) times daily -  before meals and at bedtime. 1 Device 0   glucose blood (TRUE METRIX BLOOD GLUCOSE TEST) test strip Use as instructed 100 each 5   insulin lispro  protamine-lispro (HUMALOG 50/50) (50-50) 100 UNIT/ML SUSP injection Inject 80 Units into the skin 3 (three) times daily. Up to 60 depending on cbg     JANUVIA 100 MG tablet Take 100 mg by mouth daily.     lisinopril-hydrochlorothiazide (PRINZIDE,ZESTORETIC) 20-12.5 MG per tablet Take 1 tablet by mouth daily.     meclizine (ANTIVERT) 25 MG tablet Take 1 tablet (25 mg total) by mouth 3 (three) times daily as needed for dizziness. 30 tablet 0   metFORMIN (GLUCOPHAGE) 500 MG tablet Take 500 mg by mouth 2 (two) times daily with a meal.     nystatin-triamcinolone ointment (MYCOLOG) Apply 1   application topically 2 (two) times daily. 30 g 0   ondansetron (ZOFRAN ODT) 4 MG disintegrating tablet Take 1 tablet (4 mg total) by mouth every 8 (eight) hours as needed for nausea or vomiting. 10 tablet 0   pravastatin (PRAVACHOL) 40 MG tablet Take 40 mg by mouth at bedtime.     topiramate (TOPAMAX) 50 MG tablet Take 1 tablet (50 mg total) by mouth 2 (two) times daily. Start 1 tablet daily x 1 week then twice daily 60 tablet 3   TRUEPLUS LANCETS 28G MISC 1 each by Does not apply route 4 (four) times daily -  before meals and at bedtime. 100 each 5   No current facility-administered medications on file prior to visit.    Allergies:   Allergies  Allergen Reactions   Lactose Intolerance (Gi) Diarrhea and Nausea And Vomiting   Latex     Like a burning in area     Physical Exam  Vitals:   10/31/20 0915  Weight: 252 lb (114.3 kg)  Height: 5' 8" (1.727 m)    Body mass index is 38.32 kg/m. No results found.   General: Obese pleasant middle-aged African-American female, seated, in no evident distress Head: head normocephalic and atraumatic.   Neck: supple with no carotid or supraclavicular bruits Cardiovascular: regular rate and rhythm, no murmurs Musculoskeletal: no deformity Skin:  no rash/petichiae Vascular:  Normal pulses all extremities   Neurologic Exam Mental Status: Awake and fully alert.   Fluent speech and language.  Oriented to place and time. Recent and remote memory intact. Attention span, concentration and fund of knowledge appropriate. Mood and affect appropriate.  Cranial Nerves: Pupils equal, briskly reactive to light. Extraocular movements full without nystagmus. Visual fields full to confrontation. Hearing intact. Facial sensation intact. Slight facial asymmetry on left. tongue, palate moves normally and symmetrically.  Motor: Normal bulk and tone. Normal strength in all tested extremity muscles. Sensory.: intact to touch , pinprick , position and vibratory sensation.  Coordination: Rapid alternating movements normal in all extremities. Finger-to-nose and heel-to-shin performed accurately bilaterally. Gait and Station: Arises from chair without difficulty. Stance is normal. Gait demonstrates normal stride length with slight imbalance making turns and use of cane Reflexes: 1+ and symmetric. Toes downgoing.        Diagnostic Data (Labs, Imaging, Testing)  MR BRAIN WO CONTRAST 08/17/2018 (Completed at [facility] - view via care everywhere) FINDINGS:  #  Skull/marrow/soft tissues: Unremarkable.  #  Orbits: Unremarkable.  #  Sinuses and mastoid air cells: Unremarkable.   Brain:  #   There is no acute infarct by diffusion weighted imaging.  #  No hemorrhage or mass lesion.  #  Sulci and gyri are normal except for some old encephalomalacia in the right frontal lobe at the middle frontal gyrus over the insula.  #  The ventricles are normal.  #  Midline structures are unremarkable. Subcortical structures show old encephalomalacia in the posterior inferior right cerebellar hemisphere.  #  White matter tracts show a few scattered T2 hyperintensities in cerebral white matter periventricular regions.  #  Vessels and Dural Venous Sinuses:Appear patent with normal flow voids.   #  Additional comments: None.    IMPRESSION:  1.  No acute intracranial abnormality.   CT  ANGIO HEAD W OR WO CONTRAST CT ANGIO NECK W OR WO CONTRAST 08/28/2018 IMPRESSION: Intracranial occlusive disease progressive over time. Worsened appearance of the distal right vertebral artery which is very narrow and irregular, giving some supply  to a small right PICA. Left vertebral artery is becoming stenotic distally with stenosis at the vertebrobasilar junction. Basilar artery is narrow and irregular. 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 Right anterior cerebral artery remains patent. Right PCA takes fetal origin from the right carotid. Left carotid artery shows a 50% stenosis in the siphon and occlusion of both the anterior and middle cerebral vessels on the left. Left anterior cerebral artery receives it supply from a patent anterior communicating artery. Left middle cerebral artery receives it supply from collateral vessels and possibly a small amount of antegrade flow. 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  MR BRAIN WO CONTRAST 08/27/2018 IMPRESSION: 1. Moderate-sized acute right cerebellar infarct. 2. 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  ECHO TEE 08/30/2018 IMPRESSIONS  1. The left ventricle has normal systolic function, with an ejection fraction of 60-65%. The cavity size was normal.  2. No evidence of a thrombus present in the left atrial appendage.  3. There is right to left shunting , likely through a PFO using bubble contrast.  4. Mitral valve regurgitation is mild to moderate by color flow Doppler.  5. No stenosis of the aortic valve.  6. The aortic root and ascending aorta are normal in size and structure.  7. Evidence of atrial level shunting detected by color flow Doppler.      ASSESSMENT: Michelle Brooks is a 55 y.o. 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  Vascular risk factors include hx of multiple strokes,  DM, HTN, HLD and intracranial occlusive disease.  She returns today after being lost to follow-up over the past 2 years with multiple complaints including memory loss, speech difficulty, drooling and facial asymmetry, headaches and vision changes    PLAN:  New complaints as above:  complete MR brain w/wo contrast to rule out new stroke or underlying structural abnormality contributing to symptoms.  Discussed prior imaging in July and based on her symptoms at that time, contrast was not indicated.  Contrast is now indicated due to new onset headaches and memory loss concerns. Complete carotid ultrasound with known carotid stenosis and MRA head for known intracranial multifocal stenosis She was advised to establish care with retinal specialist with complaints of worsening vision (she was seen by multiple prior retinal specialist and not satisfied with their care).  Discussed importance of routine retinal exams with uncontrolled diabetes. Obtain lab work today to assess for reversible causes of memory loss Hx of Right cerebellar infarct :  Loop recorder has not shown atrial fibrillation thus far -routinely monitored by cardiology Continue aspirin 81 mg daily  and atorvastatin 40 mg daily for secondary stroke prevention.   Advised to discontinue aspirin as no indication for prolonged DAPT as studies have shown no prolonged DAPT for stroke prevention does not lower stroke risk and increases risk for brain bleed Discussed importance of routine follow-up to PCP for aggressive stroke risk factor management and secondary stroke prevention measures HTN: BP goal<130/90.  Stable today on current regimen per PCP HLD: LDL goal<70.  Repeat lipid panel today.  Prior LDL 113 (04/2020) on atorvastatin 40 mg daily DMII: A1c goal<7.  Uncontrolled with recent A1c 9.0.  Discussed importance of adequate diabetic control and to establish care with retina specialist    Follow up in 3 months or call earlier if  needed   CC:  [facility] provider: Dr. Theodoro Doing, MD  I spent 59 minutes of face-to-face and non-face-to-face time with patient.  This included previsit chart review, lab review, study review, order entry, electronic health record documentation, patient education and discussion regarding multiple new complaints and further evaluation, prior stroke history and secondary stroke prevention measures and aggressive stroke risk factor management, review of loop recorder downloads, and answered all other questions to patient satisfaction   Jessica McCue, AGNP-BC  [facility] [address] [place], [place] [address]  Phone [phone] Fax [phone] Note: This document was prepared with digital dictation and possible smart phrase technology. Any transcriptional errors that result from this process are unintentional.      

## 2020-10-31 NOTE — Telephone Encounter (Signed)
No auth require for the MRI. I faxed pacemaker information to [facility], they will reach out to the patient to schedule.   No auth require for the Carotid. I sent a message to Lupita Leash she will reach out to the patient to schedule.

## 2020-11-01 ENCOUNTER — Encounter: Payer: Self-pay | Admitting: Adult Health

## 2020-11-01 ENCOUNTER — Other Ambulatory Visit: Payer: Self-pay | Admitting: Adult Health

## 2020-11-01 LAB — VITAMIN B12: Vitamin B-12: 270 pg/mL (ref 232–1245)

## 2020-11-01 LAB — LIPID PANEL
Chol/HDL Ratio: 6.5 ratio — ABNORMAL HIGH (ref 0.0–4.4)
Cholesterol, Total: 203 mg/dL — ABNORMAL HIGH (ref 100–199)
HDL: 31 mg/dL — ABNORMAL LOW (ref >39)
LDL Chol Calc (NIH): 142 mg/dL — ABNORMAL HIGH (ref 0–99)
Triglycerides: 165 mg/dL — ABNORMAL HIGH (ref 0–149)
VLDL Cholesterol Cal: 30 mg/dL (ref 5–40)

## 2020-11-01 LAB — VITAMIN D 25 HYDROXY (VIT D DEFICIENCY, FRACTURES): Vit D, 25-Hydroxy: 11.7 ng/mL — ABNORMAL LOW (ref 30.0–100.0)

## 2020-11-01 LAB — BUN+CREAT
BUN/Creatinine Ratio: 16 (ref 9–23)
BUN: 16 mg/dL (ref 6–24)
Creatinine, Ser: 1.03 mg/dL — ABNORMAL HIGH (ref 0.57–1.00)
eGFR: 64 mL/min/{1.73_m2} (ref >59)

## 2020-11-01 LAB — TSH: TSH: 0.668 u[IU]/mL (ref 0.450–4.500)

## 2020-11-01 MED ORDER — PRAVASTATIN SODIUM 80 MG PO TABS: 80.0000 mg | ORAL_TABLET | Freq: Every day | ORAL | 5 refills | Status: DC

## 2020-11-02 ENCOUNTER — Other Ambulatory Visit: Payer: Self-pay

## 2020-11-02 ENCOUNTER — Ambulatory Visit (HOSPITAL_COMMUNITY)
Admission: RE | Admit: 2020-11-02 | Discharge: 2020-11-02 | Disposition: A | Discharge status: Home / Self Care | Payer: Medicare Other | Source: Ambulatory Visit | Attending: Adult Health | Admitting: Adult Health

## 2020-11-02 DIAGNOSIS — I6523 Occlusion and stenosis of bilateral carotid arteries: Secondary | ICD-10-CM | POA: Insufficient documentation

## 2020-11-02 NOTE — Progress Notes (Signed)
I agree with the above plan 

## 2020-11-06 ENCOUNTER — Ambulatory Visit
Admission: RE | Admit: 2020-11-06 | Discharge: 2020-11-06 | Disposition: A | Discharge status: Home / Self Care | Payer: Medicare Other | Source: Ambulatory Visit | Attending: Obstetrics and Gynecology | Admitting: Obstetrics and Gynecology

## 2020-11-06 ENCOUNTER — Inpatient Hospital Stay: Admission: RE | Admit: 2020-11-06 | Payer: Medicare Other | Source: Ambulatory Visit

## 2020-11-06 ENCOUNTER — Telehealth: Payer: Self-pay

## 2020-11-06 ENCOUNTER — Other Ambulatory Visit: Payer: Self-pay | Admitting: Obstetrics and Gynecology

## 2020-11-06 ENCOUNTER — Other Ambulatory Visit: Payer: Self-pay

## 2020-11-06 DIAGNOSIS — R921 Mammographic calcification found on diagnostic imaging of breast: Secondary | ICD-10-CM

## 2020-11-06 DIAGNOSIS — R928 Other abnormal and inconclusive findings on diagnostic imaging of breast: Secondary | ICD-10-CM

## 2020-11-06 NOTE — Telephone Encounter (Signed)
-----   Message from Ihor Austin, NP sent at 11/06/2020  3:38 PM EDT ----- Please advise patient.  She did not review results sent to her via MyChart

## 2020-11-06 NOTE — Telephone Encounter (Signed)
Contacted pt back, informed her that lab work showed normal but on lower side B12 levels and would recommend starting B12 supplement taking 1000 mcg daily which can be purchased at her local pharmacy.  her  vitamin D levels were also low -please follow-up with her PCP regarding treatment options.  her cholesterol levels were quite elevated with your LDL bad cholesterol 142 with goal less than 70.  advised to Please increase pravastatin from 40 mg to 80 mg daily.  A new prescription was sent to pharmacy. She was appreciative for the call

## 2020-11-07 LAB — CUP PACEART REMOTE DEVICE CHECK
Date Time Interrogation Session: 20220923020851
Implantable Pulse Generator Implant Date: 20200720

## 2020-11-07 NOTE — Telephone Encounter (Signed)
Patient left a voicemail to schedule her MRI, I called her back and informed her that [facility] called her to schedule her exams. I gave her their number of [phone]. She states she will give them a call.

## 2020-11-08 ENCOUNTER — Ambulatory Visit (INDEPENDENT_AMBULATORY_CARE_PROVIDER_SITE_OTHER): Payer: Medicare Other

## 2020-11-08 DIAGNOSIS — I6300 Cerebral infarction due to thrombosis of unspecified precerebral artery: Secondary | ICD-10-CM

## 2020-11-10 ENCOUNTER — Encounter: Payer: Self-pay | Admitting: Radiology

## 2020-11-14 ENCOUNTER — Ambulatory Visit
Admission: RE | Admit: 2020-11-14 | Discharge: 2020-11-14 | Disposition: A | Discharge status: Home / Self Care | Payer: Medicare Other | Source: Ambulatory Visit | Attending: Obstetrics and Gynecology | Admitting: Obstetrics and Gynecology

## 2020-11-14 ENCOUNTER — Other Ambulatory Visit: Payer: Self-pay

## 2020-11-14 DIAGNOSIS — R921 Mammographic calcification found on diagnostic imaging of breast: Secondary | ICD-10-CM

## 2020-11-14 HISTORY — PX: BREAST BIOPSY: SHX20

## 2020-11-15 NOTE — Progress Notes (Signed)
Carelink Summary Report / Loop Recorder 

## 2020-11-16 ENCOUNTER — Ambulatory Visit (HOSPITAL_COMMUNITY): Payer: Medicare Other

## 2020-11-19 ENCOUNTER — Ambulatory Visit (HOSPITAL_COMMUNITY)
Admission: RE | Admit: 2020-11-19 | Discharge: 2020-11-19 | Disposition: A | Discharge status: Home / Self Care | Payer: Medicare Other | Source: Ambulatory Visit | Attending: Adult Health | Admitting: Adult Health

## 2020-11-19 ENCOUNTER — Other Ambulatory Visit: Payer: Self-pay

## 2020-11-19 DIAGNOSIS — I679 Cerebrovascular disease, unspecified: Secondary | ICD-10-CM | POA: Insufficient documentation

## 2020-11-19 DIAGNOSIS — I6523 Occlusion and stenosis of bilateral carotid arteries: Secondary | ICD-10-CM | POA: Diagnosis present

## 2020-11-19 DIAGNOSIS — R413 Other amnesia: Secondary | ICD-10-CM | POA: Insufficient documentation

## 2020-11-19 DIAGNOSIS — R4789 Other speech disturbances: Secondary | ICD-10-CM | POA: Diagnosis present

## 2020-11-19 MED ORDER — GADOBUTROL 1 MMOL/ML IV SOLN
10.0000 mL | Freq: Once | INTRAVENOUS | Status: AC | PRN
  Administered 2020-11-19: 10 mL via INTRAVENOUS

## 2020-11-27 ENCOUNTER — Telehealth: Payer: Self-pay

## 2020-11-27 ENCOUNTER — Other Ambulatory Visit: Payer: Self-pay | Admitting: Adult Health

## 2020-11-27 DIAGNOSIS — I679 Cerebrovascular disease, unspecified: Secondary | ICD-10-CM

## 2020-11-27 DIAGNOSIS — I639 Cerebral infarction, unspecified: Secondary | ICD-10-CM

## 2020-11-27 MED ORDER — ASPIRIN EC 81 MG PO TBEC: 81.0000 mg | DELAYED_RELEASE_TABLET | Freq: Every day | ORAL | 0 refills | Status: AC

## 2020-11-27 MED ORDER — CLOPIDOGREL BISULFATE 75 MG PO TABS: 75.0000 mg | ORAL_TABLET | Freq: Every day | ORAL | 11 refills | Status: DC

## 2020-11-27 NOTE — Telephone Encounter (Signed)
-----   Message from Ihor Austin, NP sent at 11/27/2020 12:08 PM EDT ----- Pt requested call for results.  Please advise patient that recent MRI did show evidence of new areas of stroke more recently as well as new areas of stroke since prior imaging.  Vessel imaging showed progression of severe intracranial stenosis and per Dr. Pearlean Brownie, referral will be placed to interventional radiology for further evaluation and possible treatment options to prevent additional strokes.  Also recommend aspirin and Plavix for 3 months then Plavix alone - a new prescription will be sent to pharmacy.  Also imperative for aggressive stroke risk factor management routinely monitored by PCP. Thank you

## 2020-11-27 NOTE — Telephone Encounter (Signed)
Contacted pt regarding MRI results, informed her that recent MRI did show evidence of new areas of stroke more recently as well as new areas of stroke since prior imaging.  Vessel imaging showed progression of severe intracranial stenosis (narrowing of arteries in skull)and per Dr. Pearlean Brownie, referral will be placed to interventional radiology for further evaluation and possible treatment options to prevent additional strokes.  Also recommend aspirin and Plavix for 3 months then Plavix alone - a new prescription was sent to pharmacy.  Also imperative for aggressive stroke risk factor management routinely monitored by PCP. Pt asked me to go over results again with daughter, which I did. Advised to all the office with any other questions or concerns, she and the daughter understood and were appreciative.

## 2020-12-05 LAB — CUP PACEART REMOTE DEVICE CHECK
Date Time Interrogation Session: 20221026021237
Implantable Pulse Generator Implant Date: 20200720

## 2020-12-11 ENCOUNTER — Ambulatory Visit (INDEPENDENT_AMBULATORY_CARE_PROVIDER_SITE_OTHER): Payer: Medicare Other

## 2020-12-11 DIAGNOSIS — I6300 Cerebral infarction due to thrombosis of unspecified precerebral artery: Secondary | ICD-10-CM

## 2020-12-18 NOTE — Progress Notes (Signed)
Carelink Summary Report / Loop Recorder 

## 2021-01-07 ENCOUNTER — Inpatient Hospital Stay (INDEPENDENT_AMBULATORY_CARE_PROVIDER_SITE_OTHER): Payer: Medicare Other

## 2021-01-07 DIAGNOSIS — I6300 Cerebral infarction due to thrombosis of unspecified precerebral artery: Secondary | ICD-10-CM | POA: Diagnosis not present

## 2021-01-07 LAB — CUP PACEART REMOTE DEVICE CHECK
Date Time Interrogation Session: 20221128011824
Implantable Pulse Generator Implant Date: 20200720

## 2021-01-15 NOTE — Progress Notes (Signed)
Carelink Summary Report / Loop Recorder 

## 2021-01-29 ENCOUNTER — Encounter: Payer: Self-pay | Admitting: Adult Health

## 2021-01-29 ENCOUNTER — Other Ambulatory Visit (HOSPITAL_COMMUNITY): Payer: Self-pay | Admitting: Neuroradiology

## 2021-01-29 ENCOUNTER — Ambulatory Visit (INDEPENDENT_AMBULATORY_CARE_PROVIDER_SITE_OTHER): Payer: Medicare Other | Admitting: Adult Health

## 2021-01-29 VITALS — BP 137/83 | HR 89 | Ht 68.0 in | Wt 254.0 lb

## 2021-01-29 DIAGNOSIS — I679 Cerebrovascular disease, unspecified: Secondary | ICD-10-CM | POA: Diagnosis not present

## 2021-01-29 DIAGNOSIS — H538 Other visual disturbances: Secondary | ICD-10-CM

## 2021-01-29 DIAGNOSIS — E785 Hyperlipidemia, unspecified: Secondary | ICD-10-CM

## 2021-01-29 DIAGNOSIS — H519 Unspecified disorder of binocular movement: Secondary | ICD-10-CM

## 2021-01-29 DIAGNOSIS — L659 Nonscarring hair loss, unspecified: Secondary | ICD-10-CM | POA: Diagnosis not present

## 2021-01-29 DIAGNOSIS — I639 Cerebral infarction, unspecified: Secondary | ICD-10-CM

## 2021-01-29 DIAGNOSIS — H5111 Convergence insufficiency: Secondary | ICD-10-CM

## 2021-01-29 DIAGNOSIS — E538 Deficiency of other specified B group vitamins: Secondary | ICD-10-CM

## 2021-01-29 NOTE — Patient Instructions (Signed)
Continue aspirin 81 mg daily and clopidogrel 75 mg daily  and pravastatin 80mg  daily  for secondary stroke prevention In 1 month, you will stop the aspirin and continue on plavix alone  We will repeat cholesterol levels today  We will repeat b12 level today - if not significantly low, it will be recommended that you start a over the counter b12 supplement. If significantly low, then you will follow up with your PCP to discuss injections  Continue to follow up with PCP regarding cholesterol, blood pressure and diabetes management  Maintain strict control of hypertension with blood pressure goal between 120-150/80-90, diabetes with hemoglobin A1c goal below 7.0 % and cholesterol with LDL cholesterol (bad cholesterol) goal below 70 mg/dL.   Signs of a Stroke? Follow the BEFAST method:  Balance Watch for a sudden loss of balance, trouble with coordination or vertigo Eyes Is there a sudden loss of vision in one or both eyes? Or double vision?  Face: Ask the person to smile. Does one side of the face droop or is it numb?  Arms: Ask the person to raise both arms. Does one arm drift downward? Is there weakness or numbness of a leg? Speech: Ask the person to repeat a simple phrase. Does the speech sound slurred/strange? Is the person confused ? Time: If you observe any of these signs, call 911.  Referral placed to dermatology for hair loss evaluation       Followup in the future with me in 6 months or call earlier if needed       Thank you for coming to see at [facility]. I hope we have been able to provide you high quality care today.  You may receive a patient satisfaction survey over the next few weeks. We would appreciate your feedback and comments so that we may continue to improve ourselves and the health of our patients.

## 2021-01-29 NOTE — Progress Notes (Signed)
[facility] [address] [place]. [place] [address] [phone]       STROKE FOLLOW UP NOTE  Ms. Mayme Genta Date of Birth:  09-30-1965 Medical Record Number:  [id number]   Primary neurologist: Dr. Leonie Man Reason for Referral: stroke follow up    CHIEF COMPLAINT:  Chief Complaint  Patient presents with   Follow-up    Rm 3 alone here for 3 month f/u. Reports she has been doing well. Feels like her vision has changed planning to see eye doctor soon.      HPI:   KIMAYA WHITLATCH is a 55 year old female with PMHx pertinent for history of multiple strokes, diabetes, hypertension, hyperlipidemia, headaches, and chronic back pain.  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  Her PCP, Dr. Jeanie Cooks, referred her back to this office to reestablish care and stroke follow-up with revisit on 10/31/2020. But apparently she has been having issues with her speech and memory (counting $20 bills but counting by 10s instead of 20s which is "not like her"). This seemed to worsen when she was started on doxycycline in August and has been since improving since completing after 2 weeks.reports right side drooling occasionally.  Also reports occasional headache over the left eye worsening after eyestrain such as watching TV or trying to read.  She does believe her vision has decreased.  Did have an MRI brain 08/16/2020 for visual changes and floaters which did not show any acute abnormalities - she was seen by ophthalmology and was told it was likely due to "old age" which she does not agree with.  She is also frustrated that the MRI completed in July did not have contrast especially with her history of multiple recurrent strokes.  Compliant on aspirin and atorvastatin 40 mg daily without side effects.  Blood pressure today 132/78.  Loop recorder has not  shown atrial fibrillation thus far initially implanted 08/2018.  Reports difficulty controlling glucose levels currently managed by PCP.  Recent A1c 9.0 on 10/11/2020   Update 01/29/2021 JM: Returns for 84-monthfollow-up unaccompanied.    At prior visit (as above), multiple new concerns.  Repeat MRI brain w/wo contrast 11/2020 showed multiple small subacute infarcts in the deep white matter of the left cerebral hemisphere consistent with watershed infarcts and multiple remote infarcts in the bilateral cerebral hemispheres and right cerebellar hemisphere progressed from prior MRI in 08/2018. MRA head showed severe intracranial steno-occlusive disease progressed from prior imaging (per radiologist impression, consistent with moyamoya). Carotid ultrasound 10/2020 showed 1-39% stenosis. Loop recorder continuously monitored which has not shown atrial fibrillation thus far. Prior hypercoagulable labs 08/2018 negative.  In setting of new strokes, placed on aspirin and Plavix for 3 months then Plavix alone as well as increased pravastatin from 40 mg to 80 mg daily (LDL 142). Referral to IR was also placed for further evaluation but has not yet been scheduled. B12 level 270 and Vit D 11.7 -recommended starting supplement.   Since prior visit, overall doing well. Denies new stroke/TIA symptoms.  Occasional word finding difficulty and mild short-term memory but overall improving since prior visit and has been learning to compensate.  Does c/o decreased vision in left eye with blurred vision and eye movement issues which has been ongoing.   Has remained on both aspirin and Plavix as well as increased dose of pravastatin without side effects Blood  pressure today 137/83 Believes recent A1c "8.something" - unable to view via epic  She did not start on any B12 or Vit D supplement.   C/o hair loss which she questions due to lisinopril-hydrochlorothiazide and has since discontinued this and started on olmesartan by PCP.    No further concerns at this time      ROS:   14 system review of systems performed and negative with exception of see HPI  PMH:  Past Medical History:  Diagnosis Date   Chronic back pain    Diabetes mellitus type 2 in obese ([place])    Diabetic retinopathy ([place])    GERD (gastroesophageal reflux disease)    Headache    History of stroke 03/2010   Right MCA stroke in 03/2010, with MRI also noting subacute strokes in left fronto parietal area - occured in [place] received care at [facility] thought due to uncontrolled blood pressure // No residual deficits // TTE (03/2010) at [facility] - normal LV systolic function, moderate pericardial effusion with 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   HSV-2 infection    Hyperlipidemia LDL goal < 100 04/01/2011   Hypertension    Lumbago    pain in the back    PSH:  Past Surgical History:  Procedure Laterality Date   BREAST LUMPECTOMY  2002   states benign lymph node removed.   BUBBLE STUDY  08/30/2018   Procedure: BUBBLE STUDY;  Surgeon: Thayer Headings, MD;  Location: [place];  Service: Cardiovascular;;   ENDOMETRIAL BIOPSY  10/04/2020   LOOP RECORDER INSERTION N/A 08/30/2018   Procedure: LOOP RECORDER INSERTION;  Surgeon: Evans Lance, MD;  Location: [facility] [facility];  Service: Cardiovascular;  Laterality: N/A;   TEE WITHOUT CARDIOVERSION N/A 08/30/2018   Procedure: TRANSESOPHAGEAL ECHOCARDIOGRAM (TEE);  Surgeon: Acie Fredrickson, Wonda Cheng, MD;  Location: [facility] ENDOSCOPY;  Service: Cardiovascular;  Laterality: N/A;    Social History:  Social History   Socioeconomic History   Marital status: Divorced    Spouse name: Not on file   Number of children: 3   Years of education: 12th grade   Highest education level: Not on file  Occupational History   Occupation: unemployed    Comment: previously worked in [place] before her stroke  Tobacco Use   Smoking status: Former    Packs/day:  0.20    Years: 7.00    Pack years: 1.40    Types: Cigarettes    Quit date: 03/26/2004    Years since quitting: 16.8   Smokeless tobacco: Never  Substance and Sexual Activity   Alcohol use: No   Drug use: No   Sexual activity: Yes    Partners: Male    Birth control/protection: Condom  Other Topics Concern   Not on file  Social History Narrative   Recently moved from [place] in 10/2010 due to wanting a change from stressors.   Previously followed by Dr. Wynelle Bourgeois at [facility] Strain: Not on file  Food Insecurity: Not on file  Transportation Needs: Not on file  Physical Activity: Not on file  Stress: Not on file  Social Connections: Not on file  Intimate Partner Violence: Not on file    Family History:  Family History  Problem Relation Age of Onset   Diabetes Mother    Stroke Mother 14   Diabetes Brother  Diabetes Brother    Cancer Brother 73       unknown type   Hypertension Brother     Medications:   Current Outpatient Medications on File Prior to Visit  Medication Sig Dispense Refill   aspirin EC 81 MG tablet Take 1 tablet (81 mg total) by mouth daily. Swallow whole. 90 tablet 0   Blood Glucose Monitoring Suppl (ACCU-CHEK AVIVA PLUS) W/DEVICE KIT 1 kit by Does not apply route 3 (three) times daily before meals. 1 kit 0   Blood Glucose Monitoring Suppl (TRUE METRIX METER) DEVI 1 each by Does not apply route 4 (four) times daily -  before meals and at bedtime. 1 Device 0   clopidogrel (PLAVIX) 75 MG tablet Take 1 tablet (75 mg total) by mouth daily. 30 tablet 11   glucose blood (TRUE METRIX BLOOD GLUCOSE TEST) test strip Use as instructed 100 each 5   insulin lispro protamine-lispro (HUMALOG 50/50) (50-50) 100 UNIT/ML SUSP injection Inject 80 Units into the skin 3 (three) times daily. Up to 60 depending on cbg     JANUVIA 100 MG tablet Take 100 mg by mouth daily.     Olmesartan Medoxomil (BENICAR PO) Take by mouth.  With HCTZ unsure of dosage     ondansetron (ZOFRAN ODT) 4 MG disintegrating tablet Take 1 tablet (4 mg total) by mouth every 8 (eight) hours as needed for nausea or vomiting. 10 tablet 0   pravastatin (PRAVACHOL) 80 MG tablet Take 1 tablet (80 mg total) by mouth daily. 30 tablet 5   tiZANidine (ZANAFLEX) 4 MG capsule Take 4 mg by mouth daily as needed for muscle spasms.     TRUEPLUS LANCETS 28G MISC 1 each by Does not apply route 4 (four) times daily -  before meals and at bedtime. 100 each 5   No current facility-administered medications on file prior to visit.    Allergies:   Allergies  Allergen Reactions   Lactose Intolerance (Gi) Diarrhea and Nausea And Vomiting   Latex     Like a burning in area     Physical Exam  Vitals:   01/29/21 0909  BP: 137/83  Pulse: 89  Weight: 254 lb (115.2 kg)  Height: '5\' 8"'  (1.727 m)     Body mass index is 38.62 kg/m. No results found.  General: Obese very pleasant middle-aged African-American female, seated, in no evident distress Head: head normocephalic and atraumatic.   Neck: supple with no carotid or supraclavicular bruits Cardiovascular: regular rate and rhythm, no murmurs Musculoskeletal: no deformity Skin:  no rash/petichiae Vascular:  Normal pulses all extremities   Neurologic Exam Mental Status: Awake and fully alert.  Fluent speech and language.  Oriented to place and time. Recent memory subjectively impaired and remote memory intact. Attention span, concentration and fund of knowledge appropriate. Mood and affect appropriate.  Cranial Nerves: Pupils equal, briskly reactive to light. Extraocular movements full without nystagmus although left eye convergence insufficiency. Visual fields full to confrontation. Hearing intact. Facial sensation intact. Face, tongue, palate moves normally and symmetrically.  Motor: Normal bulk and tone. Normal strength in all tested extremity muscles. Sensory.: intact to touch , pinprick , position  and vibratory sensation.  Coordination: Rapid alternating movements normal in all extremities. Finger-to-nose and heel-to-shin performed accurately bilaterally. Gait and Station: Arises from chair without difficulty. Stance is normal. Gait demonstrates normal stride length with slight imbalance making turns and use of cane Reflexes: 1+ and symmetric. Toes downgoing.  Diagnostic Data (Labs, Imaging, Testing)  MR BRAIN W/WO CONTRAST  MR ANGIO HEAD 11/19/2020 IMPRESSION: 1. Multiple small subacute infarcts in the deep white matter of the left cerebral hemisphere consistent with watershed infarcts. 2. Multiple remote infarcts in the bilateral cerebral hemispheres and right cerebellar hemisphere, progressed from prior MRI. 3. Severe intracranial steno-occlusive disease, as described above, suggestive of moyamoya disease, progressed since prior CT angiogram, considering differences in technique.  CAROTID ULTRASOUND 11/02/2020 Summary:  Right Carotid: Velocities in the right ICA are consistent with a 1-39% stenosis.  Left Carotid: Velocities in the left ICA are consistent with a 1-39% stenosis.  Vertebrals:  Bilateral vertebral arteries demonstrate antegrade flow.  Subclavians: Normal flow hemodynamics were seen in bilateral subclavian arteries.    MR BRAIN WO CONTRAST 08/17/2018 (Completed at [facility] - view via care everywhere) FINDINGS:  #  Skull/marrow/soft tissues: Unremarkable.  #  Orbits: Unremarkable.  #  Sinuses and mastoid air cells: Unremarkable.   Brain:  #   There is no acute infarct by diffusion weighted imaging.  #  No hemorrhage or mass lesion.  #  Sulci and gyri are normal except for some old encephalomalacia in the right frontal lobe at the middle frontal gyrus over the insula.  #  The ventricles are normal.  #  Midline structures are unremarkable. Subcortical structures show old encephalomalacia in the posterior inferior right cerebellar hemisphere.  #   White matter tracts show a few scattered T2 hyperintensities in cerebral white matter periventricular regions.  #  Vessels and Dural Venous Sinuses:Appear patent with normal flow voids.   #  Additional comments: None.    IMPRESSION:  1.  No acute intracranial abnormality.   CT ANGIO HEAD W OR WO CONTRAST CT ANGIO NECK W OR WO CONTRAST 08/28/2018 IMPRESSION: Intracranial occlusive disease progressive over time. Worsened appearance of the distal right vertebral artery which is very narrow and irregular, giving some supply to a small right PICA. Left vertebral artery is becoming stenotic distally with stenosis at the vertebrobasilar junction. Basilar artery is narrow and irregular. 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 Right anterior cerebral artery remains patent. Right PCA takes fetal origin from the right carotid. Left carotid artery shows a 50% stenosis in the siphon and occlusion of both the anterior and middle cerebral vessels on the left. Left anterior cerebral artery receives it supply from a patent anterior communicating artery. Left middle cerebral artery receives it supply from collateral vessels and possibly a small amount of antegrade flow. 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  MR BRAIN WO CONTRAST 08/27/2018 IMPRESSION: 1. Moderate-sized acute right cerebellar infarct. 2. 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  ECHO TEE 08/30/2018 IMPRESSIONS  1. The left ventricle has normal systolic function, with an ejection fraction of 60-65%. The cavity size was normal.  2. No evidence of a thrombus present in the left atrial appendage.  3. There is right to left shunting , likely through a PFO using bubble contrast.  4. Mitral valve regurgitation is mild to moderate by color flow Doppler.  5. No stenosis of the aortic valve.  6. The aortic root and ascending aorta are normal in size and structure.  7. Evidence  of atrial level shunting detected by color flow Doppler.       ASSESSMENT: TRENICE MESA is a 55 y.o. 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 Evidence of  subacute watershed pattern strokes on imaging in 11/2020 likely in setting of worsening intracranial stenosis (?  Moyamoya disease).  Vascular risk factors include hx of multiple strokes, DM, HTN, HLD and intracranial occlusive disease.      PLAN:  Multiple recurrent strokes:  Loop recorder has not shown atrial fibrillation thus far -routinely monitored by cardiology Prior hypercoagulable panel negative Continue clopidogrel 75 mg daily  and pravastatin 80 mg daily for secondary stroke prevention.  Continue aspirin for additional 30 days then stop Discussed importance of routine follow-up to PCP for aggressive stroke risk factor management and secondary stroke prevention measures Intracranial stenosis: per recent imaging, ?  Moyamoya disease. Referred to IR back in October - will reach out to them to see why she has not yet been scheduled. Complete 90 day DAPT then plavix alone as well as high dose statin HTN: BP goal<130/90.  Stable today on current regimen per PCP HLD: LDL goal<70.  Most recent LDL 146 (10/2020) up from 113 (04/2020) . Increased dose of pravastatin from 40 mg to 80 mg daily.  Repeat lipid panel today. DMII: A1c goal<7.  Uncontrolled although improving with recent A1c "8.something" down from 9.0.  Currently managed by PCP Alopecia: areas of baldness with pt reporting large clumps of hair falling out. Recent TSH normal. Referral placed to dermatology B12 deficiency: Repeat B12 level today.  If remains on lower side of normal, will recommend starting b12 oral supplement but if significantly low, advised to follow-up with PCP to discuss injections OS vision concerns: referral placed to ophthalmology    Follow up in 6 months or call earlier  if needed   CC:  Nolene Ebbs, MD   I spent 43 minutes of face-to-face and non-face-to-face time with patient.  This included previsit chart review, lab review, study review, order entry, electronic health record documentation, patient education and discussion regarding repeat imaging and evidence of new strokes and worsening intracranial stenosis, secondary stroke prevention measures and aggressive stroke risk factor management, and other concerns as listed above and answered all the questions to patient satisfaction   Frann Rider, AGNP-BC  [facility] [address] [place], [place] [address]  Phone [phone] Fax [phone] Note: This document was prepared with digital dictation and possible smart phrase technology. Any transcriptional errors that result from this process are unintentional.

## 2021-01-30 ENCOUNTER — Telehealth: Payer: Self-pay

## 2021-01-30 DIAGNOSIS — E785 Hyperlipidemia, unspecified: Secondary | ICD-10-CM

## 2021-01-30 LAB — B12 AND FOLATE PANEL
Folate: 13.3 ng/mL (ref >3.0)
Vitamin B-12: 303 pg/mL (ref 232–1245)

## 2021-01-30 LAB — LIPID PANEL
Chol/HDL Ratio: 5.2 ratio — ABNORMAL HIGH (ref 0.0–4.4)
Cholesterol, Total: 170 mg/dL (ref 100–199)
HDL: 33 mg/dL — ABNORMAL LOW (ref >39)
LDL Chol Calc (NIH): 106 mg/dL — ABNORMAL HIGH (ref 0–99)
Triglycerides: 178 mg/dL — ABNORMAL HIGH (ref 0–149)
VLDL Cholesterol Cal: 31 mg/dL (ref 5–40)

## 2021-01-30 MED ORDER — REPATHA SURECLICK 140 MG/ML ~~LOC~~ SOAJ: 140.0000 mg | SUBCUTANEOUS | 11 refills | Status: DC

## 2021-01-30 NOTE — Telephone Encounter (Signed)
Contacted pt again, informed her of Jessica's recommendations. She was appreciative and was Advised to contact us back with concerns or intolerance.

## 2021-01-30 NOTE — Telephone Encounter (Signed)
Contacted pt to go over results, I did ask her if there was a reason as to why she switched statins, she wasn't sure. Her Dr switched It but she was not having intolerance or reaction to it. She would prefer the injection as she had previously discussed this with her Dr and think that's why her statins was switched due to insurance coverage.

## 2021-01-30 NOTE — Telephone Encounter (Signed)
-----   Message from Ihor Austin, NP sent at 01/30/2021  2:22 PM EST ----- She requested telephone call for results - recent cholesterol levels remain elevated despite increasing pravastatin dosage. She was previously on atorvastatin but at some point switch to pravastatin - can you please see why this was? If due to intolerance, it will be recommended to start twice monthly injectable medication called Repatha as statins are not adequately managing cholesterol. Please let me know. Thank you

## 2021-01-30 NOTE — Telephone Encounter (Signed)
Order placed to start Repatha - she will continue current dose of pravastatin. Can follow up with PCP to repeat levels in 3 months after starting and is satisfactory levels, pravastatin can be discontinued.

## 2021-01-30 NOTE — Addendum Note (Signed)
Addended by: Ihor Austin L on: 01/30/2021 04:37 PM   Modules accepted: Orders

## 2021-01-31 ENCOUNTER — Telehealth: Payer: Self-pay

## 2021-01-31 ENCOUNTER — Telehealth: Payer: Self-pay | Admitting: *Deleted

## 2021-01-31 NOTE — Telephone Encounter (Signed)
Repatha PA, key: [id number]. [employer] is reviewing your PA request. Typically an electronic response will be received within 24-72 hours.

## 2021-01-31 NOTE — Telephone Encounter (Signed)
I have submitted a PA for Repatha on CMM, Key: [id number].   Awaiting determination from [employer]

## 2021-01-31 NOTE — Telephone Encounter (Signed)
REPATHA SURE INJ 140MG /ML, use as directed, is approved through 08/01/2021 under your Medicare Part D benefit. Approval letter faxed to pharmacy.

## 2021-02-05 ENCOUNTER — Telehealth (HOSPITAL_COMMUNITY): Payer: Self-pay

## 2021-02-05 NOTE — Telephone Encounter (Signed)
Will call pt back after the New Year to schedule at pt request. AW

## 2021-02-12 ENCOUNTER — Inpatient Hospital Stay (INDEPENDENT_AMBULATORY_CARE_PROVIDER_SITE_OTHER): Payer: Medicare Other

## 2021-02-12 DIAGNOSIS — I6300 Cerebral infarction due to thrombosis of unspecified precerebral artery: Secondary | ICD-10-CM | POA: Diagnosis not present

## 2021-02-12 LAB — CUP PACEART REMOTE DEVICE CHECK
Date Time Interrogation Session: 20230102233021
Implantable Pulse Generator Implant Date: 20200720

## 2021-02-21 NOTE — Progress Notes (Signed)
Carelink Summary Report / Loop Recorder 

## 2021-03-01 ENCOUNTER — Other Ambulatory Visit: Payer: Self-pay | Admitting: Internal Medicine

## 2021-03-02 LAB — CBC
HCT: 42.6 % (ref 35.0–45.0)
Hemoglobin: 14.1 g/dL (ref 11.7–15.5)
MCH: 28.3 pg (ref 27.0–33.0)
MCHC: 33.1 g/dL (ref 32.0–36.0)
MCV: 85.4 fL (ref 80.0–100.0)
MPV: 10.1 fL (ref 7.5–12.5)
Platelets: 328 10*3/uL (ref 140–400)
RBC: 4.99 10*6/uL (ref 3.80–5.10)
RDW: 13.4 % (ref 11.0–15.0)
WBC: 8.1 10*3/uL (ref 3.8–10.8)

## 2021-03-02 LAB — COMPLETE METABOLIC PANEL WITH GFR
AG Ratio: 1.2 (calc) (ref 1.0–2.5)
ALT: 24 U/L (ref 6–29)
AST: 16 U/L (ref 10–35)
Albumin: 4 g/dL (ref 3.6–5.1)
Alkaline phosphatase (APISO): 104 U/L (ref 37–153)
BUN/Creatinine Ratio: 14 (calc) (ref 6–22)
BUN: 16 mg/dL (ref 7–25)
CO2: 24 mmol/L (ref 20–32)
Calcium: 9.6 mg/dL (ref 8.6–10.4)
Chloride: 102 mmol/L (ref 98–110)
Creat: 1.11 mg/dL — ABNORMAL HIGH (ref 0.50–1.03)
Globulin: 3.3 g/dL (calc) (ref 1.9–3.7)
Glucose, Bld: 173 mg/dL — ABNORMAL HIGH (ref 65–99)
Potassium: 4.1 mmol/L (ref 3.5–5.3)
Sodium: 139 mmol/L (ref 135–146)
Total Bilirubin: 0.5 mg/dL (ref 0.2–1.2)
Total Protein: 7.3 g/dL (ref 6.1–8.1)
eGFR: 59 mL/min/{1.73_m2} — ABNORMAL LOW (ref >=60)

## 2021-03-02 LAB — LIPID PANEL
Cholesterol: 162 mg/dL (ref <200)
HDL: 34 mg/dL — ABNORMAL LOW (ref >=50)
LDL Cholesterol (Calc): 106 mg/dL (calc) — ABNORMAL HIGH
Non-HDL Cholesterol (Calc): 128 mg/dL (calc) (ref <130)
Total CHOL/HDL Ratio: 4.8 (calc) (ref <5.0)
Triglycerides: 120 mg/dL (ref <150)

## 2021-03-02 LAB — TSH: TSH: 1.21 mIU/L

## 2021-03-02 LAB — VITAMIN D 25 HYDROXY (VIT D DEFICIENCY, FRACTURES): Vit D, 25-Hydroxy: 18 ng/mL — ABNORMAL LOW (ref 30–100)

## 2021-03-13 ENCOUNTER — Encounter (INDEPENDENT_AMBULATORY_CARE_PROVIDER_SITE_OTHER): Payer: Self-pay | Admitting: Ophthalmology

## 2021-03-13 ENCOUNTER — Other Ambulatory Visit: Payer: Self-pay

## 2021-03-13 ENCOUNTER — Ambulatory Visit (INDEPENDENT_AMBULATORY_CARE_PROVIDER_SITE_OTHER): Payer: Medicare Other | Admitting: Ophthalmology

## 2021-03-13 DIAGNOSIS — H35052 Retinal neovascularization, unspecified, left eye: Secondary | ICD-10-CM | POA: Diagnosis not present

## 2021-03-13 DIAGNOSIS — H35033 Hypertensive retinopathy, bilateral: Secondary | ICD-10-CM

## 2021-03-13 DIAGNOSIS — H3093 Unspecified chorioretinal inflammation, bilateral: Secondary | ICD-10-CM | POA: Diagnosis not present

## 2021-03-13 DIAGNOSIS — E119 Type 2 diabetes mellitus without complications: Secondary | ICD-10-CM

## 2021-03-13 DIAGNOSIS — I10 Essential (primary) hypertension: Secondary | ICD-10-CM | POA: Diagnosis not present

## 2021-03-13 DIAGNOSIS — H3092 Unspecified chorioretinal inflammation, left eye: Secondary | ICD-10-CM

## 2021-03-13 MED ORDER — CLINDAMYCIN HCL 150 MG PO CAPS: 300.0000 mg | ORAL_CAPSULE | Freq: Four times a day (QID) | ORAL | 0 refills | Status: DC

## 2021-03-13 MED ORDER — SULFAMETHOXAZOLE-TRIMETHOPRIM 800-160 MG PO TABS: 1.0000 | ORAL_TABLET | Freq: Two times a day (BID) | ORAL | 0 refills | Status: DC

## 2021-03-13 NOTE — Progress Notes (Signed)
[facility] Note  03/13/2021     CHIEF COMPLAINT Patient presents for Retina Evaluation   HISTORY OF PRESENT ILLNESS: Michelle Brooks is a 56 y.o. female who presents to the clinic today for:   HPI     Retina Evaluation   In left eye.  Duration of 4 months.  Associated Symptoms Distortion.  I, the attending physician,  performed the HPI with the patient and updated documentation appropriately.        Comments   Retina eval per Dr. Katy Fitch.  When driving lines look wavy OS.  It comes and goes.  In the sun the waviness looks like it is flickering.  Light bothers her.  She even watches TV in the dark.  She was dilated yesterday which has caused her a lot of issues.  She has been to different doctors about this without any results.   At times she will see like a hair or string in vision OS.   DMII since 2006, BS 74 this morning, A1C 8 Patients mother went blind from diabetes.      Last edited by Bernarda Caffey, MD on 03/13/2021 12:41 PM.    Pt is here on the referral of Dr. Elliot Dally for retinal eval, pt states she saw him yesterday bc she has been having problems with her left eye vision, pt states she saw a dr at [facility] last year and was dx with retinal colobomas, pt did not have a follow up there, pt states she grew up in [place], [place], she denies hx of any infections, pt is occasionally around her daughters cat, pt has been diabetic since 2006  Referring physician: Clent Jacks, MD [address]  [place], [place] [address]  HISTORICAL INFORMATION:   Selected notes from the MEDICAL RECORD NUMBER Referred by Dr. Katy Fitch LEE: 03/12/2021 Ocular Hx-scars OU PMH-DM    CURRENT MEDICATIONS: No current outpatient medications on file. (Ophthalmic Drugs)   No current facility-administered medications for this visit. (Ophthalmic Drugs)   Current Outpatient Medications (Other)  Medication Sig   clindamycin (CLEOCIN) 150 MG capsule Take 2 capsules (300 mg total) by  mouth 4 (four) times daily for 21 days.   clopidogrel (PLAVIX) 75 MG tablet Take 1 tablet (75 mg total) by mouth daily.   Evolocumab (REPATHA SURECLICK) 170 MG/ML SOAJ Inject 140 mg into the skin every 14 (fourteen) days.   insulin lispro protamine-lispro (HUMALOG 50/50) (50-50) 100 UNIT/ML SUSP injection Inject 80 Units into the skin 3 (three) times daily. Up to 60 depending on cbg   JANUVIA 100 MG tablet Take 100 mg by mouth daily.   Olmesartan Medoxomil (BENICAR PO) Take by mouth. With HCTZ unsure of dosage   ondansetron (ZOFRAN ODT) 4 MG disintegrating tablet Take 1 tablet (4 mg total) by mouth every 8 (eight) hours as needed for nausea or vomiting.   pravastatin (PRAVACHOL) 80 MG tablet Take 1 tablet (80 mg total) by mouth daily.   sulfamethoxazole-trimethoprim (BACTRIM DS) 800-160 MG tablet Take 1 tablet by mouth 2 (two) times daily.   tiZANidine (ZANAFLEX) 4 MG capsule Take 4 mg by mouth daily as needed for muscle spasms.   TRUEPLUS LANCETS 28G MISC 1 each by Does not apply route 4 (four) times daily -  before meals and at bedtime.   Blood Glucose Monitoring Suppl (ACCU-CHEK AVIVA PLUS) W/DEVICE KIT 1 kit by Does not apply route 3 (three) times daily before meals.   Blood Glucose  Monitoring Suppl (TRUE METRIX METER) DEVI 1 each by Does not apply route 4 (four) times daily -  before meals and at bedtime.   glucose blood (TRUE METRIX BLOOD GLUCOSE TEST) test strip Use as instructed   No current facility-administered medications for this visit. (Other)   REVIEW OF SYSTEMS: ROS   Positive for: Gastrointestinal, Neurological, Endocrine, Eyes Negative for: Constitutional, Skin, Genitourinary, Musculoskeletal, HENT, Cardiovascular, Respiratory, Psychiatric, Allergic/Imm, Heme/Lymph Last edited by Leonie Douglas, COA on 03/13/2021  9:19 AM.     ALLERGIES Allergies  Allergen Reactions   Lactose Intolerance (Gi) Diarrhea and Nausea And Vomiting   Latex     Like a burning in area   PAST  MEDICAL HISTORY Past Medical History:  Diagnosis Date   Chronic back pain    Diabetes mellitus type 2 in obese ([place])    Diabetic retinopathy ([place])    GERD (gastroesophageal reflux disease)    Headache    History of stroke 03/2010   Right MCA stroke in 03/2010, with MRI also noting subacute strokes in left fronto parietal area - occured in [place] received care at [facility] thought due to uncontrolled blood pressure // No residual deficits // TTE (03/2010) at [facility] - normal LV systolic function, moderate pericardial effusion with 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   HSV-2 infection    Hyperlipidemia LDL goal < 100 04/01/2011   Hypertension    Lumbago    pain in the back   Past Surgical History:  Procedure Laterality Date   BREAST LUMPECTOMY  2002   states benign lymph node removed.   BUBBLE STUDY  08/30/2018   Procedure: BUBBLE STUDY;  Surgeon: Thayer Headings, MD;  Location: [facility];  Service: Cardiovascular;;   ENDOMETRIAL BIOPSY  10/04/2020   LOOP RECORDER INSERTION N/A 08/30/2018   Procedure: LOOP RECORDER INSERTION;  Surgeon: Evans Lance, MD;  Location: [facility];  Service: Cardiovascular;  Laterality: N/A;   TEE WITHOUT CARDIOVERSION N/A 08/30/2018   Procedure: TRANSESOPHAGEAL ECHOCARDIOGRAM (TEE);  Surgeon: Thayer Headings, MD;  Location: [facility]OS[facility];  Service: Cardiovascular;  Laterality: N/A;   FAMILY HISTORY Family History  Problem Relation Age of Onset   Diabetes Mother    Stroke Mother 66   Diabetes Brother    Diabetes Brother    Cancer Brother 73       unknown type   Hypertension Brother    SOCIAL HISTORY Social History   Tobacco Use   Smoking status: Former    Packs/day: 0.20    Years: 7.00    Pack years: 1.40    Types: Cigarettes    Quit date: 03/26/2004    Years since quitting: 16.9   Smokeless tobacco: Never  Substance Use Topics   Alcohol use: No   Drug use: No        OPHTHALMIC EXAM:  Base Eye Exam     Visual Acuity (Snellen - Linear)       Right Left   Dist [place] 20/20 20/250   Dist ph [place]  20/250 +1         Tonometry (Tonopen, 9:30 AM)       Right Left   Pressure 14 13         Pupils       Dark Light Shape React APD   Right 2 1 Round Slow None   Left 2 1 Round Slow None  Visual Fields (Counting fingers)       Left Right    Full Full         Extraocular Movement       Right Left    Full Full         Neuro/Psych     Oriented x3: Yes   Mood/Affect: Normal         Dilation     Both eyes: 1.0% Mydriacyl, 2.5% Phenylephrine @ 9:30 AM           Slit Lamp and Fundus Exam     Slit Lamp Exam       Right Left   Lids/Lashes Dermatochalasis - upper lid, Meibomian gland dysfunction Dermatochalasis - upper lid, Meibomian gland dysfunction   Conjunctiva/Sclera Melanosis Melanosis   Cornea arcus arcus, trace PEE   Anterior Chamber Deep, 0.5+fine pigment, narrow temporal angle Deep, 0.5+fine pigment   Iris Round and dilated, No NVI Round and dilated, No NVI   Lens 2+ Nuclear sclerosis with early brunescence, 2+ Cortical cataract 2+ Nuclear sclerosis with early brunescence, 2-3+ Cortical cataract   Anterior Vitreous Vitreous syneresis, no cell; no vitritis or vitreous haze Vitreous syneresis, no pigment; no vitritis or vitreous haze         Fundus Exam       Right Left   Disc Pink and Sharp Pink and Sharp, mild PPP   C/D Ratio 0.6 0.3   Macula Flat, Good foveal reflex, focal pigmented CR scar nasal and temporal macula, No heme or edema Blunted foveal reflex, CNV with surrounding SRF and exudates temporal fovea and macula, large excavated pigmented CR scars temporal and IT macula   Vessels attenuated, mild tortuousity attenuated, mild tortuousity, mild AV crossing changes, mild Copper wiring   Periphery Attached, focal pigmented CR scars superiorly Attached, focal pigmented CR scars superior midzone            Refraction     Manifest Refraction       Sphere Cylinder Dist VA   Right      Left +1.00 Sphere 20/150-1            IMAGING AND PROCEDURES  Imaging and Procedures for 03/13/2021  OCT, Retina - OU - Both Eyes       Right Eye Quality was good. Central Foveal Thickness: 228. Progression has no prior data. Findings include normal foveal contour, no IRF, no SRF, outer retinal atrophy, subretinal hyper-reflective material (Focal CR atrophy and SRHM temporal macula caught on widefield).   Left Eye Quality was good. Central Foveal Thickness: 618. Progression has no prior data. Findings include abnormal foveal contour, subretinal hyper-reflective material, intraretinal hyper-reflective material, intraretinal fluid, subretinal fluid, pigment epithelial detachment, epiretinal membrane (On widefield, focal excavated CR scars with adjacent SRF and SRHM IT midzone).   Notes *Images captured and stored on drive  Diagnosis / Impression:  OD: Focal CR atrophy and SRHM temporal macula caught on widefield OS: +SRF/edema temporal fovea and macula; On widefield, focal excavated CR scars with adjacent SRF and SRHM IT midzone  Clinical management:  See below  Abbreviations: NFP - Normal foveal profile. CME - cystoid macular edema. PED - pigment epithelial detachment. IRF - intraretinal fluid. SRF - subretinal fluid. EZ - ellipsoid zone. ERM - epiretinal membrane. ORA - outer retinal atrophy. ORT - outer retinal tubulation. SRHM - subretinal hyper-reflective material. IRHM - intraretinal hyper-reflective material      Fluorescein Angiography Optos (Transit OS)       Right  Eye Progression has no prior data. Early phase findings include staining, window defect. Mid/Late phase findings include staining, window defect (No leakage or CNV).   Left Eye Progression has no prior data. Early phase findings include staining, window defect. Mid/Late phase findings include staining, window defect,  leakage, choroidal neovascularization (Large excavated CR scars temporal macula with focal leaking CNV nasal to temporal scar (temporal to fovea)).   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 Bilateral posterior uveitis  H30.93 Toxoplasma antibodies- IgG and  IgM    Fluorescein Angiography Optos (Transit OS)    QuantiFERON-TB Gold Plus    RPR w/reflex to TrepSure    2. 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 Diabetes mellitus type 2 without retinopathy ([place])  E11.9 OCT, Retina - OU - Both Eyes    4. Essential hypertension  I10     5. Hypertensive retinopathy of both eyes  H35.033 Fluorescein Angiography Optos (Transit OS)      1,2. 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 Diabetes mellitus, type 2 without retinopathy - The incidence, risk factors for  progression, natural history and treatment options for diabetic retinopathy  were discussed with patient.   - The need for close monitoring of blood glucose, blood pressure, and serum lipids, avoiding cigarette or any type of tobacco, and the need for long term follow up was also discussed with patient. - f/u in 1 year, sooner prn  4,5. Hypertensive retinopathy OU - discussed importance of tight BP control - monitor  6. Mixed Cataract OU - The symptoms of cataract, surgical options, and treatments and risks were discussed with patient. - discussed diagnosis and progression - not yet visually significant - monitor for now    Ophthalmic Meds Ordered this visit:  Meds ordered this encounter  Medications   sulfamethoxazole-trimethoprim (BACTRIM DS) 800-160 MG tablet    Sig: Take 1 tablet by mouth 2 (two) times daily.    Dispense:  42 tablet    Refill:  0   clindamycin (CLEOCIN) 150 MG capsule    Sig: Take 2 capsules (300 mg total) by mouth 4 (four) times daily for 21 days.    Dispense:  168 capsule    Refill:  0     Return in about 1 week (  around 03/20/2021) for f/u choroidal NV due to chorioretinitis OS, DFE, OCT.  There are no Patient Instructions on file for this visit.   Explained the diagnoses, plan, and follow up with the patient and they expressed understanding.  Patient expressed understanding of the importance of proper follow up care.   Gardiner Sleeper, M.D., Ph.D. Diseases & Surgery of the Retina and Vitreous [facility]  I have reviewed the above documentation for accuracy and completeness, and I agree with the above. Gardiner Sleeper, M.D., Ph.D. 03/13/21 1:11 PM   Abbreviations: M myopia (nearsighted); A astigmatism; H hyperopia (farsighted); P presbyopia; Mrx spectacle prescription;  CTL contact lenses; OD right eye; OS left eye; OU both eyes  XT exotropia; ET esotropia; PEK punctate epithelial keratitis; PEE punctate epithelial erosions; DES  dry eye syndrome; MGD meibomian gland dysfunction; ATs artificial tears; PFAT's preservative free artificial tears; [place] nuclear sclerotic cataract; PSC posterior subcapsular cataract; ERM epi-retinal membrane; PVD posterior vitreous detachment; RD retinal detachment; DM diabetes mellitus; DR diabetic retinopathy; NPDR non-proliferative diabetic retinopathy; PDR proliferative diabetic retinopathy; CSME clinically significant macular edema; DME diabetic macular edema; dbh dot blot hemorrhages; CWS cotton wool spot; POAG primary open angle glaucoma; C/D cup-to-disc ratio; HVF humphrey visual field; GVF goldmann visual field; OCT optical coherence tomography; IOP intraocular pressure; BRVO Branch retinal vein occlusion; CRVO central retinal vein occlusion; CRAO central retinal artery occlusion; BRAO branch retinal artery occlusion; RT retinal tear; SB scleral buckle; PPV pars plana vitrectomy; VH Vitreous hemorrhage; PRP panretinal laser photocoagulation; IVK intravitreal kenalog; VMT vitreomacular traction; MH Macular hole;  NVD neovascularization of the disc; NVE neovascularization elsewhere; AREDS age related eye disease study; ARMD age related macular degeneration; POAG primary open angle glaucoma; EBMD epithelial/anterior basement membrane dystrophy; ACIOL anterior chamber intraocular lens; IOL intraocular lens; PCIOL posterior chamber intraocular lens; Phaco/IOL phacoemulsification with intraocular lens placement; [place] photorefractive keratectomy; LASIK laser assisted in situ keratomileusis; HTN hypertension; DM diabetes mellitus; COPD chronic obstructive pulmonary disease

## 2021-03-14 LAB — TOXOPLASMA ANTIBODIES- IGG AND  IGM
Toxoplasma Antibody- IgM: <3 AU/mL (ref 0.0–7.9)
Toxoplasma IgG Ratio: <3 IU/mL (ref 0.0–7.1)

## 2021-03-15 ENCOUNTER — Encounter (INDEPENDENT_AMBULATORY_CARE_PROVIDER_SITE_OTHER): Payer: Self-pay | Admitting: Ophthalmology

## 2021-03-15 LAB — QUANTIFERON-TB GOLD PLUS

## 2021-03-16 LAB — QUANTIFERON-TB GOLD PLUS
QuantiFERON Mitogen Value: >10 IU/mL
QuantiFERON Nil Value: 0.33 IU/mL
QuantiFERON TB1 Ag Value: 0.34 IU/mL
QuantiFERON TB2 Ag Value: 0.37 IU/mL
QuantiFERON-TB Gold Plus: NEGATIVE

## 2021-03-16 LAB — RPR W/REFLEX TO TREPSURE: RPR: NONREACTIVE

## 2021-03-16 LAB — T PALLIDUM ANTIBODY, EIA: T pallidum Antibody, EIA: NEGATIVE

## 2021-03-18 ENCOUNTER — Inpatient Hospital Stay (INDEPENDENT_AMBULATORY_CARE_PROVIDER_SITE_OTHER): Payer: Medicare Other

## 2021-03-18 DIAGNOSIS — I6300 Cerebral infarction due to thrombosis of unspecified precerebral artery: Secondary | ICD-10-CM | POA: Diagnosis not present

## 2021-03-18 LAB — CUP PACEART REMOTE DEVICE CHECK
Date Time Interrogation Session: 20230205231415
Implantable Pulse Generator Implant Date: 20200720

## 2021-03-19 ENCOUNTER — Other Ambulatory Visit: Payer: Self-pay

## 2021-03-19 NOTE — Progress Notes (Addendum)
[facility] Note  03/20/2021     CHIEF COMPLAINT Patient presents for Retina Follow Up   HISTORY OF PRESENT ILLNESS: Michelle Brooks is a 56 y.o. female who presents to the clinic today for:   HPI     Retina Follow Up   Patient presents with  Other.  In both eyes.  Duration of 1 week.  Since onset it is gradually improving.  I, the attending physician,  performed the HPI with the patient and updated documentation appropriately.        Comments   1 week follow up focal chorioretinis/ posterior uveitis OU- Vision is a little better.  She is driving again.  The sunlight is not bothering her as much anymore.  Only seeing a little of a wave for flickering on occasion.  Still taking Bactrim and Clindamycin.   BS 85 this morning.  She went to PCP today, all vitals were good.  Cholesterol, BP, etc.      Last edited by Bernarda Caffey, MD on 03/20/2021  4:35 PM.    Pt states her vision is improving, she has been able to drive and says the glare from light is not as bad as before, she feels like the antibiotics are helping her  vision. Reports +diarrhea  Referring physician: Clent Jacks, MD [address]  [place], [place] [address]  HISTORICAL INFORMATION:   Selected notes from the MEDICAL RECORD NUMBER Referred by Dr. Katy Fitch LEE: 03/12/2021 Ocular Hx-scars OU PMH-DM    CURRENT MEDICATIONS: No current outpatient medications on file. (Ophthalmic Drugs)   No current facility-administered medications for this visit. (Ophthalmic Drugs)   Current Outpatient Medications (Other)  Medication Sig   clindamycin (CLEOCIN) 150 MG capsule Take 2 capsules (300 mg total) by mouth 4 (four) times daily for 21 days.   clopidogrel (PLAVIX) 75 MG tablet Take 1 tablet (75 mg total) by mouth daily.   Evolocumab (REPATHA SURECLICK) 106 MG/ML SOAJ Inject 140 mg into the skin every 14 (fourteen) days.   insulin lispro protamine-lispro (HUMALOG 50/50) (50-50) 100 UNIT/ML SUSP  injection Inject 80 Units into the skin 3 (three) times daily. Up to 60 depending on cbg   JANUVIA 100 MG tablet Take 100 mg by mouth daily.   Olmesartan Medoxomil (BENICAR PO) Take by mouth. With HCTZ unsure of dosage   ondansetron (ZOFRAN ODT) 4 MG disintegrating tablet Take 1 tablet (4 mg total) by mouth every 8 (eight) hours as needed for nausea or vomiting.   pravastatin (PRAVACHOL) 80 MG tablet Take 1 tablet (80 mg total) by mouth daily.   sulfamethoxazole-trimethoprim (BACTRIM DS) 800-160 MG tablet Take 1 tablet by mouth 2 (two) times daily.   tiZANidine (ZANAFLEX) 4 MG capsule Take 4 mg by mouth daily as needed for muscle spasms.   TRUEPLUS LANCETS 28G MISC 1 each by Does not apply route 4 (four) times daily -  before meals and at bedtime.   Blood Glucose Monitoring Suppl (ACCU-CHEK AVIVA PLUS) W/DEVICE KIT 1 kit by Does not apply route 3 (three) times daily before meals.   Blood Glucose Monitoring Suppl (TRUE METRIX METER) DEVI 1 each by Does not apply route 4 (four) times daily -  before meals and at bedtime.   glucose blood (TRUE METRIX BLOOD GLUCOSE TEST) test strip Use as instructed   No current facility-administered medications for this visit. (Other)   REVIEW OF SYSTEMS: ROS   Positive for: Gastrointestinal, Neurological, Endocrine, Eyes Negative  for: Constitutional, Skin, Genitourinary, Musculoskeletal, HENT, Cardiovascular, Respiratory, Psychiatric, Allergic/Imm, Heme/Lymph Last edited by Leonie Douglas, COA on 03/20/2021  1:39 PM.      ALLERGIES Allergies  Allergen Reactions   Lactose Intolerance (Gi) Diarrhea and Nausea And Vomiting   Latex     Like a burning in area   PAST MEDICAL HISTORY Past Medical History:  Diagnosis Date   Chronic back pain    Diabetes mellitus type 2 in obese ([place])    Diabetic retinopathy ([place])    GERD (gastroesophageal reflux disease)    Headache    History of stroke 03/2010   Right MCA stroke in 03/2010, with MRI also noting subacute  strokes in left fronto parietal area - occured in [place] received care at [facility] thought due to uncontrolled blood pressure // No residual deficits // TTE (03/2010) at [facility] - normal LV systolic function, moderate pericardial effusion with 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   HSV-2 infection    Hyperlipidemia LDL goal < 100 04/01/2011   Hypertension    Lumbago    pain in the back   Past Surgical History:  Procedure Laterality Date   BREAST LUMPECTOMY  2002   states benign lymph node removed.   BUBBLE STUDY  08/30/2018   Procedure: BUBBLE STUDY;  Surgeon: Thayer Headings, MD;  Location: [facility];  Service: Cardiovascular;;   ENDOMETRIAL BIOPSY  10/04/2020   LOOP RECORDER INSERTION N/A 08/30/2018   Procedure: LOOP RECORDER INSERTION;  Surgeon: Evans Lance, MD;  Location: [facility];  Service: Cardiovascular;  Laterality: N/A;   TEE WITHOUT CARDIOVERSION N/A 08/30/2018   Procedure: TRANSESOPHAGEAL ECHOCARDIOGRAM (TEE);  Surgeon: Thayer Headings, MD;  Location: [facility] ENDOSCOPY;  Service: Cardiovascular;  Laterality: N/A;   FAMILY HISTORY Family History  Problem Relation Age of Onset   Diabetes Mother    Stroke Mother 24   Diabetes Brother    Diabetes Brother    Cancer Brother 14       unknown type   Hypertension Brother    SOCIAL HISTORY Social History   Tobacco Use   Smoking status: Former    Packs/day: 0.20    Years: 7.00    Pack years: 1.40    Types: Cigarettes    Quit date: 03/26/2004    Years since quitting: 16.9   Smokeless tobacco: Never  Substance Use Topics   Alcohol use: No   Drug use: No       OPHTHALMIC EXAM:  Base Eye Exam     Visual Acuity (Snellen - Linear)       Right Left   Dist [place] 20/25 -2 20/200 +2   Dist ph [place] 20/20 -2 20/150 -2         Tonometry (Tonopen, 1:46 PM)       Right Left   Pressure 14 13         Pupils       Dark Light Shape React APD   Right 2  1 Round Slow None   Left 2 1 Round Slow None         Visual Fields       Left Right    Full Full         Extraocular Movement       Right Left    Full Full         Neuro/Psych     Oriented x3: Yes  Mood/Affect: Normal         Dilation     Both eyes: 1.0% Mydriacyl, 2.5% Phenylephrine @ 1:47 PM           Slit Lamp and Fundus Exam     Slit Lamp Exam       Right Left   Lids/Lashes Dermatochalasis - upper lid, Meibomian gland dysfunction Dermatochalasis - upper lid, Meibomian gland dysfunction   Conjunctiva/Sclera Melanosis Melanosis   Cornea arcus arcus, trace PEE   Anterior Chamber Deep, 0.5+fine pigment, narrow temporal angle Deep, 0.5+fine pigment   Iris Round and dilated, No NVI Round and dilated, No NVI   Lens 2+ Nuclear sclerosis with early brunescence, 2+ Cortical cataract 2+ Nuclear sclerosis with early brunescence, 2-3+ Cortical cataract   Anterior Vitreous Vitreous syneresis, no cell; no vitritis or vitreous haze Vitreous syneresis, no pigment; no vitritis or vitreous haze         Fundus Exam       Right Left   Disc Pink and Sharp Pink and Sharp, mild PPP   C/D Ratio 0.6 0.3   Macula Flat, Good foveal reflex, focal pigmented CR scar nasal and temporal macula, No heme or edema Blunted foveal reflex, CNV with surrounding SRF and exudates temporal fovea and macula - improved, large excavated pigmented CR scars temporal and IT macula   Vessels attenuated, mild tortuousity attenuated, mild tortuousity, mild AV crossing changes, mild Copper wiring   Periphery Attached, focal pigmented CR scars superiorly Attached, focal pigmented CR scars superior midzone            IMAGING AND PROCEDURES  Imaging and Procedures for 03/20/2021  OCT, Retina - OU - Both Eyes       Right Eye Quality was good. Central Foveal Thickness: 232. Progression has been stable. Findings include normal foveal contour, no IRF, no SRF, outer retinal atrophy, subretinal  hyper-reflective material (Focal CR atrophy and SRHM temporal macula caught on widefield).   Left Eye Quality was good. Central Foveal Thickness: 581. Progression has improved. Findings include abnormal foveal contour, subretinal hyper-reflective material, intraretinal hyper-reflective material, intraretinal fluid, subretinal fluid, pigment epithelial detachment, epiretinal membrane (On widefield, focal excavated CR scars with adjacent SRF and SRHM IT midzone -- interval improvement in IRF and SRF surrounding PEDs/SRHM).   Notes *Images captured and stored on drive  Diagnosis / Impression:  OD: Focal CR atrophy and SRHM temporal macula caught on widefield -- stable OS: On widefield, focal excavated CR scars with adjacent SRF and SRHM IT midzone -- interval improvement in IRF and SRF surrounding PEDs/SRHM  Clinical management:  See below  Abbreviations: NFP - Normal foveal profile. CME - cystoid macular edema. PED - pigment epithelial detachment. IRF - intraretinal fluid. SRF - subretinal fluid. EZ - ellipsoid zone. ERM - epiretinal membrane. ORA - outer retinal atrophy. ORT - outer retinal tubulation. SRHM - subretinal hyper-reflective material. IRHM - intraretinal hyper-reflective material             ASSESSMENT/PLAN:    ICD-10-CM   1. Bilateral posterior uveitis  H30.93 OCT, Retina - OU - Both Eyes    2. 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 Diabetes mellitus type 2 without retinopathy ([place])  E11.9     4. Essential hypertension  I10     5. Hypertensive retinopathy of both eyes  H35.033      1,2. 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 Diabetes mellitus, type 2 without retinopathy - The incidence, risk factors for progression, natural history and treatment options for diabetic retinopathy  were discussed with patient.   - The need for close monitoring of blood glucose, blood pressure, and serum lipids, avoiding cigarette or any type of tobacco, and the need for long term follow up was also discussed with patient. - f/u in 1 year, sooner prn  4,5. Hypertensive retinopathy OU - discussed importance of tight BP control - monitor  6. Mixed Cataract OU - The symptoms of cataract, surgical options, and treatments and risks were discussed with patient. - discussed diagnosis and progression - not yet visually significant - monitor for now  Ophthalmic Meds Ordered this visit:  No orders of the defined types were placed in this encounter.   Return for f/u February 20, posterior uveitis OS, DFE, OCT.  There are no Patient Instructions on file for this visit.  This document serves as a record of services personally performed by Gardiner Sleeper, MD, PhD. It was created on their behalf by Orvan Falconer, an ophthalmic technician. The creation of this record is the provider's dictation and/or activities during the visit.    Electronically signed by: Orvan Falconer, OA, 03/20/21  5:08 PM  This document serves as a record of services personally performed by Gardiner Sleeper, MD, PhD. It was created on their behalf by San Jetty. Owens Shark, OA an ophthalmic technician. The creation of this record is the provider's dictation and/or activities during the visit.    Electronically signed by: San Jetty. Owens Shark, [place] 02.08.2023 5:08 PM  Gardiner Sleeper, M.D., Ph.D. Diseases & Surgery of the Retina and Vitreous [facility]  I have reviewed the above documentation for accuracy and completeness, and I agree with the above. Gardiner Sleeper, M.D., Ph.D. 03/20/21 5:08 PM    Abbreviations: M myopia (nearsighted); A astigmatism; H hyperopia (farsighted); P presbyopia; Mrx spectacle prescription;  CTL contact lenses; OD right eye; OS left eye; OU both eyes  XT exotropia; ET esotropia; PEK punctate epithelial keratitis; PEE punctate epithelial erosions; DES dry eye syndrome; MGD meibomian gland dysfunction; ATs artificial tears; PFAT's preservative free artificial tears; [place] nuclear sclerotic cataract; PSC posterior subcapsular cataract; ERM epi-retinal membrane; PVD posterior vitreous detachment; RD retinal detachment; DM diabetes mellitus; DR diabetic retinopathy;  NPDR non-proliferative diabetic retinopathy; PDR proliferative diabetic retinopathy; CSME clinically significant macular edema; DME diabetic macular edema; dbh dot blot hemorrhages; CWS cotton wool spot; POAG primary open angle glaucoma; C/D cup-to-disc ratio; HVF humphrey visual field; GVF goldmann visual field; OCT optical coherence tomography; IOP intraocular pressure; BRVO Branch retinal vein occlusion; CRVO central retinal vein occlusion; CRAO central retinal artery occlusion; BRAO branch retinal artery occlusion; RT retinal  tear; SB scleral buckle; PPV pars plana vitrectomy; VH Vitreous hemorrhage; PRP panretinal laser photocoagulation; IVK intravitreal kenalog; VMT vitreomacular traction; MH Macular hole;  NVD neovascularization of the disc; NVE neovascularization elsewhere; AREDS age related eye disease study; ARMD age related macular degeneration; POAG primary open angle glaucoma; EBMD epithelial/anterior basement membrane dystrophy; ACIOL anterior chamber intraocular lens; IOL intraocular lens; PCIOL posterior chamber intraocular lens; Phaco/IOL phacoemulsification with intraocular lens placement; [place] photorefractive keratectomy; LASIK laser assisted in situ keratomileusis; HTN hypertension; DM diabetes mellitus; COPD chronic obstructive pulmonary disease

## 2021-03-20 ENCOUNTER — Ambulatory Visit (INDEPENDENT_AMBULATORY_CARE_PROVIDER_SITE_OTHER): Payer: Medicare Other | Admitting: Ophthalmology

## 2021-03-20 ENCOUNTER — Other Ambulatory Visit: Payer: Self-pay

## 2021-03-20 ENCOUNTER — Encounter (INDEPENDENT_AMBULATORY_CARE_PROVIDER_SITE_OTHER): Payer: Self-pay | Admitting: Ophthalmology

## 2021-03-20 DIAGNOSIS — H35033 Hypertensive retinopathy, bilateral: Secondary | ICD-10-CM

## 2021-03-20 DIAGNOSIS — I10 Essential (primary) hypertension: Secondary | ICD-10-CM

## 2021-03-20 DIAGNOSIS — H35052 Retinal neovascularization, unspecified, left eye: Secondary | ICD-10-CM

## 2021-03-20 DIAGNOSIS — H3093 Unspecified chorioretinal inflammation, bilateral: Secondary | ICD-10-CM | POA: Diagnosis not present

## 2021-03-20 DIAGNOSIS — H3092 Unspecified chorioretinal inflammation, left eye: Secondary | ICD-10-CM

## 2021-03-20 DIAGNOSIS — E119 Type 2 diabetes mellitus without complications: Secondary | ICD-10-CM

## 2021-03-20 NOTE — Progress Notes (Signed)
Carelink Summary Report / Loop Recorder 

## 2021-03-29 NOTE — Progress Notes (Signed)
[facility] Note  04/01/2021     CHIEF COMPLAINT Patient presents for Retina Follow Up   HISTORY OF PRESENT ILLNESS: Michelle Brooks is a 56 y.o. female who presents to the clinic today for:   HPI     Retina Follow Up   Patient presents with  Other.  In both eyes.  Severity is moderate.  Duration of 12 days.  Since onset it is gradually improving.  I, the attending physician,  performed the HPI with the patient and updated documentation appropriately.        Comments   Pt feels like vision is improving, she is still taking the antibiotics as directed, she feels like they are helping, she states she is able to drive now, she still feels like there is something in her left eye "blocking" her vision, pts blood sugar was 93 this morning      Last edited by Bernarda Caffey, MD on 04/03/2021 11:50 AM.    Pt is taking Ocuvite vits, she feels like her vision is better, she is driving now, she states the diarrhea from the antibiotics is getting better  Referring physician: Clent Jacks, MD [address]  [place], [place] [address]  HISTORICAL INFORMATION:   Selected notes from the MEDICAL RECORD NUMBER Referred by Dr. Katy Fitch LEE: 03/12/2021 Ocular Hx-scars OU PMH-DM    CURRENT MEDICATIONS: No current outpatient medications on file. (Ophthalmic Drugs)   No current facility-administered medications for this visit. (Ophthalmic Drugs)   Current Outpatient Medications (Other)  Medication Sig   Blood Glucose Monitoring Suppl (ACCU-CHEK AVIVA PLUS) W/DEVICE KIT 1 kit by Does not apply route 3 (three) times daily before meals.   Blood Glucose Monitoring Suppl (TRUE METRIX METER) DEVI 1 each by Does not apply route 4 (four) times daily -  before meals and at bedtime.   clindamycin (CLEOCIN) 150 MG capsule Take 2 capsules (300 mg total) by mouth 4 (four) times daily for 21 days.   clopidogrel (PLAVIX) 75 MG tablet Take 1 tablet (75 mg total) by mouth daily.    Evolocumab (REPATHA SURECLICK) 191 MG/ML SOAJ Inject 140 mg into the skin every 14 (fourteen) days.   glucose blood (TRUE METRIX BLOOD GLUCOSE TEST) test strip Use as instructed   insulin lispro protamine-lispro (HUMALOG 50/50) (50-50) 100 UNIT/ML SUSP injection Inject 80 Units into the skin 3 (three) times daily. Up to 60 depending on cbg   JANUVIA 100 MG tablet Take 100 mg by mouth daily.   Olmesartan Medoxomil (BENICAR PO) Take by mouth. With HCTZ unsure of dosage   ondansetron (ZOFRAN ODT) 4 MG disintegrating tablet Take 1 tablet (4 mg total) by mouth every 8 (eight) hours as needed for nausea or vomiting.   pravastatin (PRAVACHOL) 80 MG tablet Take 1 tablet (80 mg total) by mouth daily.   sulfamethoxazole-trimethoprim (BACTRIM DS) 800-160 MG tablet Take 1 tablet by mouth 2 (two) times daily.   tiZANidine (ZANAFLEX) 4 MG capsule Take 4 mg by mouth daily as needed for muscle spasms.   TRUEPLUS LANCETS 28G MISC 1 each by Does not apply route 4 (four) times daily -  before meals and at bedtime.   valsartan (DIOVAN) 160 MG tablet    No current facility-administered medications for this visit. (Other)   REVIEW OF SYSTEMS: ROS   Positive for: Endocrine, Cardiovascular, Eyes Negative for: Constitutional, Gastrointestinal, Neurological, Skin, Genitourinary, Musculoskeletal, HENT, Respiratory, Psychiatric, Allergic/Imm, Heme/Lymph Last edited by Debbrah Alar,  COT on 04/01/2021  1:05 PM.     ALLERGIES Allergies  Allergen Reactions   Lactose Intolerance (Gi) Diarrhea and Nausea And Vomiting   Latex     Like a burning in area   PAST MEDICAL HISTORY Past Medical History:  Diagnosis Date   Chronic back pain    Diabetes mellitus type 2 in obese (HCC)    Diabetic retinopathy ([place])    GERD (gastroesophageal reflux disease)    Headache    History of stroke 03/2010   Right MCA stroke in 03/2010, with MRI also noting subacute strokes in left fronto parietal area - occured in [place] received care  at [facility] thought due to uncontrolled blood pressure // No residual deficits // TTE (03/2010) at [facility] - normal LV systolic function, moderate pericardial effusion with 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   HSV-2 infection    Hyperlipidemia LDL goal < 100 04/01/2011   Hypertension    Lumbago    pain in the back   Past Surgical History:  Procedure Laterality Date   BREAST LUMPECTOMY  2002   states benign lymph node removed.   BUBBLE STUDY  08/30/2018   Procedure: BUBBLE STUDY;  Surgeon: Thayer Headings, MD;  Location: [facility];  Service: Cardiovascular;;   ENDOMETRIAL BIOPSY  10/04/2020   LOOP RECORDER INSERTION N/A 08/30/2018   Procedure: LOOP RECORDER INSERTION;  Surgeon: Evans Lance, MD;  Location: [facility];  Service: Cardiovascular;  Laterality: N/A;   TEE WITHOUT CARDIOVERSION N/A 08/30/2018   Procedure: TRANSESOPHAGEAL ECHOCARDIOGRAM (TEE);  Surgeon: Thayer Headings, MD;  Location: [facility] ENDOSCOPY;  Service: Cardiovascular;  Laterality: N/A;   FAMILY HISTORY Family History  Problem Relation Age of Onset   Diabetes Mother    Stroke Mother 35   Diabetes Brother    Diabetes Brother    Cancer Brother 85       unknown type   Hypertension Brother    SOCIAL HISTORY Social History   Tobacco Use   Smoking status: Former    Packs/day: 0.20    Years: 7.00    Pack years: 1.40    Types: Cigarettes    Quit date: 03/26/2004    Years since quitting: 17.0   Smokeless tobacco: Never  Substance Use Topics   Alcohol use: No   Drug use: No       OPHTHALMIC EXAM:  Base Eye Exam     Visual Acuity (Snellen - Linear)       Right Left   Dist [place] 20/20 -2 20/200   Dist ph [place]  20/150         Tonometry (Tonopen, 1:14 PM)       Right Left   Pressure 18 16         Pupils       Dark Light Shape React APD   Right 2 1 Round Slow None   Left 2 1 Round Slow None         Visual Fields  (Counting fingers)       Left Right    Full Full         Extraocular Movement       Right Left    Full, Ortho Full, Ortho         Neuro/Psych     Oriented x3: Yes   Mood/Affect: Normal         Dilation     Both  eyes: 1.0% Mydriacyl, 2.5% Phenylephrine @ 1:14 PM           Slit Lamp and Fundus Exam     Slit Lamp Exam       Right Left   Lids/Lashes Dermatochalasis - upper lid, Meibomian gland dysfunction Dermatochalasis - upper lid, Meibomian gland dysfunction   Conjunctiva/Sclera Melanosis Melanosis   Cornea arcus arcus, trace PEE   Anterior Chamber Deep, 0.5+fine pigment, narrow temporal angle Deep, no cell or pigment   Iris Round and dilated, No NVI Round and dilated, No NVI   Lens 2+ Nuclear sclerosis with early brunescence, 2+ Cortical cataract 2+ Nuclear sclerosis with early brunescence, 2-3+ Cortical cataract   Anterior Vitreous Vitreous syneresis, no cell; no vitritis or vitreous haze Vitreous syneresis, no pigment; no vitritis or vitreous haze         Fundus Exam       Right Left   Disc Pink and Sharp Pink and Sharp, mild PPP   C/D Ratio 0.6 0.3   Macula Flat, Good foveal reflex, focal pigmented CR scar nasal and temporal macula, No heme or edema Blunted foveal reflex, CNV with surrounding SRF and exudates temporal fovea and macula - slightly improved, large excavated pigmented CR scars temporal and IT macula   Vessels attenuated, mild tortuousity attenuated, mild tortuousity, mild AV crossing changes, mild Copper wiring   Periphery Attached, focal pigmented CR scars superiorly Attached, focal pigmented CR scars superior midzone            IMAGING AND PROCEDURES  Imaging and Procedures for 04/01/2021  OCT, Retina - OU - Both Eyes       Right Eye Quality was good. Central Foveal Thickness: 233. Progression has been stable. Findings include normal foveal contour, no IRF, no SRF, outer retinal atrophy, subretinal hyper-reflective material  (Focal CR atrophy and SRHM temporal macula caught on widefield).   Left Eye Quality was good. Central Foveal Thickness: 570. Progression has improved. Findings include abnormal foveal contour, subretinal hyper-reflective material, intraretinal hyper-reflective material, intraretinal fluid, subretinal fluid, pigment epithelial detachment, epiretinal membrane (On widefield, focal excavated CR scars with adjacent SRF and SRHM IT midzone -- mild interval improvement in IRF and SRF surrounding PEDs/SRHM).   Notes *Images captured and stored on drive  Diagnosis / Impression:  OD: Focal CR atrophy and SRHM temporal macula caught on widefield -- stable OS: On widefield, focal excavated CR scars with adjacent SRF and SRHM IT midzone -- mild interval improvement in IRF and SRF surrounding PEDs/SRHM  Clinical management:  See below  Abbreviations: NFP - Normal foveal profile. CME - cystoid macular edema. PED - pigment epithelial detachment. IRF - intraretinal fluid. SRF - subretinal fluid. EZ - ellipsoid zone. ERM - epiretinal membrane. ORA - outer retinal atrophy. ORT - outer retinal tubulation. SRHM - subretinal hyper-reflective material. IRHM - intraretinal hyper-reflective material      Intravitreal Injection, Pharmacologic Agent - OS - Left Eye       Time Out 04/01/2021. 2:02 PM. Confirmed correct patient, procedure, site, and patient consented.   Anesthesia Topical anesthesia was used. Anesthetic medications included Lidocaine 2%, Proparacaine 0.5%.   Procedure Preparation included 5% betadine to ocular surface, eyelid speculum. A supplied needle was used.   Injection: 1.25 mg Bevacizumab 1.98m/0.05ml   Route: Intravitreal, Site: Left Eye   NDC:: 62263-335-45 Lot: [id number]' , Expiration date: 05/05/2021   Post-op Post injection exam found visual acuity of at least counting fingers. The patient tolerated the procedure well. There were no complications.  The patient received written and  verbal post procedure care education. Post injection medications were not given.            ASSESSMENT/PLAN:    ICD-10-CM   1. Bilateral posterior uveitis  H30.93 OCT, Retina - OU - Both Eyes    2. 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 Diabetes mellitus type 2 without retinopathy ([place])  E11.9 OCT, Retina - OU - Both Eyes    4. Essential hypertension  I10     5. Hypertensive retinopathy of both eyes  H35.033     6. Combined forms of age-related cataract of both eyes  H25.813       1,2. 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, OD inactive  - discussed findings, diagnosis, prognosis, treatment options  - will check toxo, TB and syphilis labs -- all labs came back negative  - ?false negative toxo -- clinical appearance and response to Bactrim + Clinda suggest +toxo  - pt reports subjective improvement in vision and visual symptoms, BCVA is stable at 20/150   - will renew Bactrim and Clindamycin prescriptions to complete an additional 21 day course as macular edema and vision are improving with antibiotic therapy  - recommend IVA OS #1 today, 02.20.23 for active CNV  - pt wihses to proceed with injection  - RBA of procedure discussed, questions answered - informed consent obtained  and signed - see procedure note - f/u March 3, DFE, OCT  3. Diabetes mellitus, type 2 without retinopathy - The incidence, risk factors for progression, natural history and treatment options for diabetic retinopathy  were discussed with patient.   - The need for close monitoring of blood glucose, blood pressure, and serum lipids, avoiding cigarette or any type of tobacco, and the need for long term follow up was also discussed with patient. - f/u in 1 year, sooner prn  4,5. Hypertensive retinopathy OU - discussed importance of tight BP control - monitor  6. Mixed Cataract OU - The symptoms of cataract, surgical options, and treatments and risks were discussed with patient. - discussed diagnosis and progression - not yet visually significant - monitor for now  Ophthalmic Meds Ordered this visit:  Meds ordered this encounter  Medications   clindamycin (CLEOCIN) 150 MG capsule    Sig: Take 2 capsules (300 mg total) by mouth 4 (four) times daily for 21 days.    Dispense:  168 capsule    Refill:  0   sulfamethoxazole-trimethoprim (BACTRIM DS) 800-160 MG tablet    Sig: Take 1 tablet by mouth 2 (two) times daily.    Dispense:  42 tablet    Refill:  0   Bevacizumab (AVASTIN) SOLN 1.25 mg    Return for f/u March  3 posterior uveitis OS, DFE, OCT.  There are no Patient Instructions on file for this visit.  This document serves as a record of services personally performed by Gardiner Sleeper, MD, PhD. It was created on their behalf by Roselee Nova, COMT. The creation of this record is the provider's dictation and/or activities during the visit.  Electronically signed by: Roselee Nova, COMT 04/03/21 11:53 AM  This document serves as a record of services personally performed by Gardiner Sleeper, MD, PhD. It was created on their behalf by San Jetty. Owens Shark, OA an ophthalmic technician. The creation of this record is the provider's dictation and/or activities during the visit.    Electronically  signed by: San Jetty. Owens Shark, [place] 02.20.2021 11:53 AM  Gardiner Sleeper, M.D., Ph.D. Diseases & Surgery of the Retina and Vitreous [facility]  I have reviewed the above documentation for accuracy and completeness, and I agree with the above. Gardiner Sleeper, M.D., Ph.D. 04/03/21 11:53 AM  Abbreviations: M myopia (nearsighted); A astigmatism; H hyperopia (farsighted); P presbyopia; Mrx spectacle prescription;  CTL contact lenses; OD right eye; OS left eye; OU both eyes  XT exotropia; ET esotropia; PEK punctate epithelial keratitis; PEE punctate epithelial erosions; DES dry eye syndrome; MGD meibomian gland dysfunction; ATs artificial tears; PFAT's preservative free artificial tears; [place] nuclear sclerotic cataract; PSC posterior subcapsular cataract; ERM epi-retinal membrane; PVD posterior vitreous detachment; RD retinal detachment; DM diabetes mellitus; DR diabetic retinopathy; NPDR non-proliferative diabetic retinopathy; PDR proliferative diabetic retinopathy; CSME clinically significant macular edema; DME diabetic macular edema; dbh dot blot hemorrhages; CWS cotton wool spot; POAG primary open angle glaucoma; C/D cup-to-disc ratio; HVF humphrey visual field; GVF goldmann visual field; OCT optical coherence tomography; IOP intraocular pressure; BRVO Branch retinal vein occlusion; CRVO central retinal vein occlusion; CRAO central retinal artery occlusion; BRAO branch retinal artery occlusion; RT retinal tear; SB scleral buckle; PPV pars plana vitrectomy; VH Vitreous hemorrhage; PRP panretinal laser photocoagulation; IVK intravitreal kenalog; VMT vitreomacular traction; MH Macular hole;  NVD neovascularization of the disc; NVE neovascularization elsewhere; AREDS age related eye disease study; ARMD age related macular degeneration; POAG primary open angle glaucoma; EBMD epithelial/anterior basement membrane dystrophy; ACIOL anterior chamber intraocular lens; IOL intraocular lens; PCIOL  posterior chamber intraocular lens; Phaco/IOL phacoemulsification with intraocular lens placement; [place] photorefractive keratectomy; LASIK laser assisted in situ keratomileusis; HTN hypertension; DM diabetes mellitus; COPD chronic obstructive pulmonary disease

## 2021-04-01 ENCOUNTER — Ambulatory Visit (INDEPENDENT_AMBULATORY_CARE_PROVIDER_SITE_OTHER): Payer: Medicare Other | Admitting: Ophthalmology

## 2021-04-01 ENCOUNTER — Other Ambulatory Visit: Payer: Self-pay

## 2021-04-01 ENCOUNTER — Encounter (INDEPENDENT_AMBULATORY_CARE_PROVIDER_SITE_OTHER): Payer: Self-pay | Admitting: Ophthalmology

## 2021-04-01 DIAGNOSIS — H3092 Unspecified chorioretinal inflammation, left eye: Secondary | ICD-10-CM

## 2021-04-01 DIAGNOSIS — H35033 Hypertensive retinopathy, bilateral: Secondary | ICD-10-CM

## 2021-04-01 DIAGNOSIS — H3093 Unspecified chorioretinal inflammation, bilateral: Secondary | ICD-10-CM | POA: Diagnosis not present

## 2021-04-01 DIAGNOSIS — E119 Type 2 diabetes mellitus without complications: Secondary | ICD-10-CM

## 2021-04-01 DIAGNOSIS — H25813 Combined forms of age-related cataract, bilateral: Secondary | ICD-10-CM

## 2021-04-01 DIAGNOSIS — I10 Essential (primary) hypertension: Secondary | ICD-10-CM | POA: Diagnosis not present

## 2021-04-01 DIAGNOSIS — H35052 Retinal neovascularization, unspecified, left eye: Secondary | ICD-10-CM | POA: Diagnosis not present

## 2021-04-01 MED ORDER — SULFAMETHOXAZOLE-TRIMETHOPRIM 800-160 MG PO TABS: 1.0000 | ORAL_TABLET | Freq: Two times a day (BID) | ORAL | 0 refills | Status: DC

## 2021-04-01 MED ORDER — CLINDAMYCIN HCL 150 MG PO CAPS: 300.0000 mg | ORAL_CAPSULE | Freq: Four times a day (QID) | ORAL | 0 refills | Status: AC

## 2021-04-03 MED ORDER — BEVACIZUMAB CHEMO INJECTION 1.25MG/0.05ML SYRINGE FOR KALEIDOSCOPE
1.2500 mg | INTRAVITREAL | Status: AC | PRN
  Administered 2021-04-03: 1.25 mg via INTRAVITREAL

## 2021-04-08 NOTE — Progress Notes (Signed)
[facility] Note  04/12/2021     CHIEF COMPLAINT Patient presents for Retina Follow Up   HISTORY OF PRESENT ILLNESS: Michelle Brooks is a 56 y.o. female who presents to the clinic today for:   HPI     Retina Follow Up   Patient presents with  Other.  In left eye.  Since onset it is gradually improving.  I, the attending physician,  performed the HPI with the patient and updated documentation appropriately.        Comments   1 1/2 week follow up CNV OS-  Patient has started driving again.  Vision seems better.  She still sees squiggly lines but is improving.  Using Bactrim and Clindamycin.  Can't remember last blood sugar.  She is eating more since taking the antibiotics.       Last edited by Bernarda Caffey, MD on 04/12/2021  8:56 AM.    Pt feels like her vision is better, she is still driving, she states she is eating more bc she is still taking Bactrim and Clindamycin  Referring physician: Clent Jacks, MD [address]  [place], [place] [id number]  HISTORICAL INFORMATION:   Selected notes from the MEDICAL RECORD NUMBER Referred by Dr. Katy Fitch LEE: 03/12/2021 Ocular Hx-scars OU PMH-DM    CURRENT MEDICATIONS: No current outpatient medications on file. (Ophthalmic Drugs)   No current facility-administered medications for this visit. (Ophthalmic Drugs)   Current Outpatient Medications (Other)  Medication Sig   clindamycin (CLEOCIN) 150 MG capsule Take 2 capsules (300 mg total) by mouth 4 (four) times daily for 21 days.   clopidogrel (PLAVIX) 75 MG tablet Take 1 tablet (75 mg total) by mouth daily.   Evolocumab (REPATHA SURECLICK) 947 MG/ML SOAJ Inject 140 mg into the skin every 14 (fourteen) days.   glucose blood (TRUE METRIX BLOOD GLUCOSE TEST) test strip Use as instructed   insulin lispro protamine-lispro (HUMALOG 50/50) (50-50) 100 UNIT/ML SUSP injection Inject 80 Units into the skin 3 (three) times daily. Up to 60 depending on cbg    JANUVIA 100 MG tablet Take 100 mg by mouth daily.   Olmesartan Medoxomil (BENICAR PO) Take by mouth. With HCTZ unsure of dosage   pravastatin (PRAVACHOL) 80 MG tablet Take 1 tablet (80 mg total) by mouth daily.   sulfamethoxazole-trimethoprim (BACTRIM DS) 800-160 MG tablet Take 1 tablet by mouth 2 (two) times daily.   tiZANidine (ZANAFLEX) 4 MG capsule Take 4 mg by mouth daily as needed for muscle spasms.   TRUEPLUS LANCETS 28G MISC 1 each by Does not apply route 4 (four) times daily -  before meals and at bedtime.   valsartan (DIOVAN) 160 MG tablet    Blood Glucose Monitoring Suppl (ACCU-CHEK AVIVA PLUS) W/DEVICE KIT 1 kit by Does not apply route 3 (three) times daily before meals.   Blood Glucose Monitoring Suppl (TRUE METRIX METER) DEVI 1 each by Does not apply route 4 (four) times daily -  before meals and at bedtime.   ondansetron (ZOFRAN ODT) 4 MG disintegrating tablet Take 1 tablet (4 mg total) by mouth every 8 (eight) hours as needed for nausea or vomiting. (Patient not taking: Reported on 04/12/2021)   No current facility-administered medications for this visit. (Other)   REVIEW OF SYSTEMS: ROS   Positive for: Endocrine, Cardiovascular, Eyes Negative for: Constitutional, Gastrointestinal, Neurological, Skin, Genitourinary, Musculoskeletal, HENT, Respiratory, Psychiatric, Allergic/Imm, Heme/Lymph Last edited by Leonie Douglas, COA on 04/12/2021  8:48 AM.  ALLERGIES Allergies  Allergen Reactions   Lactose Intolerance (Gi) Diarrhea and Nausea And Vomiting   Latex     Like a burning in area   PAST MEDICAL HISTORY Past Medical History:  Diagnosis Date   Chronic back pain    Diabetes mellitus type 2 in obese (HCC)    Diabetic retinopathy ([place])    GERD (gastroesophageal reflux disease)    Headache    History of stroke 03/2010   Right MCA stroke in 03/2010, with MRI also noting subacute strokes in left fronto parietal area - occured in [place] received care at [facility] thought due to  uncontrolled blood pressure // No residual deficits // TTE (03/2010) at [facility] - normal LV systolic function, moderate pericardial effusion with 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   HSV-2 infection    Hyperlipidemia LDL goal < 100 04/01/2011   Hypertension    Lumbago    pain in the back   Past Surgical History:  Procedure Laterality Date   BREAST LUMPECTOMY  2002   states benign lymph node removed.   BUBBLE STUDY  08/30/2018   Procedure: BUBBLE STUDY;  Surgeon: Thayer Headings, MD;  Location: [place];  Service: Cardiovascular;;   ENDOMETRIAL BIOPSY  10/04/2020   LOOP RECORDER INSERTION N/A 08/30/2018   Procedure: LOOP RECORDER INSERTION;  Surgeon: Evans Lance, MD;  Location: [facility] [facility];  Service: Cardiovascular;  Laterality: N/A;   TEE WITHOUT CARDIOVERSION N/A 08/30/2018   Procedure: TRANSESOPHAGEAL ECHOCARDIOGRAM (TEE);  Surgeon: Thayer Headings, MD;  Location: [facility] ENDOSCOPY;  Service: Cardiovascular;  Laterality: N/A;   FAMILY HISTORY Family History  Problem Relation Age of Onset   Diabetes Mother    Stroke Mother 46   Diabetes Brother    Diabetes Brother    Cancer Brother 29       unknown type   Hypertension Brother    SOCIAL HISTORY Social History   Tobacco Use   Smoking status: Former    Packs/day: 0.20    Years: 7.00    Pack years: 1.40    Types: Cigarettes    Quit date: 03/26/2004    Years since quitting: 17.0   Smokeless tobacco: Never  Substance Use Topics   Alcohol use: No   Drug use: No       OPHTHALMIC EXAM:  Base Eye Exam     Visual Acuity (Snellen - Linear)       Right Left   Dist [place] 20/20 20/150   Dist ph [place]  NI         Tonometry (Tonopen, 8:55 AM)       Right Left   Pressure 10 8         Pupils       Dark Light Shape React APD   Right 2 1 Round Slow None   Left 2 1 Round Slow None         Visual Fields (Counting fingers)       Left Right     Full Full         Extraocular Movement       Right Left    Full Full         Neuro/Psych     Oriented x3: Yes   Mood/Affect: Normal         Dilation     Both eyes: 1.0% Mydriacyl, 2.5% Phenylephrine @ 8:55 AM  Slit Lamp and Fundus Exam     Slit Lamp Exam       Right Left   Lids/Lashes Dermatochalasis - upper lid, Meibomian gland dysfunction Dermatochalasis - upper lid, Meibomian gland dysfunction   Conjunctiva/Sclera Melanosis Melanosis   Cornea arcus arcus, trace PEE   Anterior Chamber Deep, 0.5+fine pigment, narrow temporal angle Deep, no cell or pigment   Iris Round and dilated, No NVI Round and dilated, No NVI   Lens 2+ Nuclear sclerosis with early brunescence, 2+ Cortical cataract 2+ Nuclear sclerosis with early brunescence, 2-3+ Cortical cataract   Anterior Vitreous Vitreous syneresis, no cell; no vitritis or vitreous haze Vitreous syneresis, no pigment; no vitritis or vitreous haze         Fundus Exam       Right Left   Disc Pink and Sharp Pink and Sharp, mild PPP   C/D Ratio 0.6 0.3   Macula Flat, Good foveal reflex, focal pigmented CR scar nasal and temporal macula, No heme or edema Blunted foveal reflex, CNV with surrounding SRF and exudates temporal fovea and macula - slightly improved, large excavated pigmented CR scars temporal and IT macula   Vessels attenuated, mild tortuousity attenuated, mild tortuousity, mild AV crossing changes, mild Copper wiring   Periphery Attached, focal pigmented CR scars superiorly Attached, focal pigmented CR scars superior midzone           IMAGING AND PROCEDURES  Imaging and Procedures for 04/12/2021  OCT, Retina - OU - Both Eyes       Right Eye Quality was good. Central Foveal Thickness: 231. Progression has been stable. Findings include normal foveal contour, no IRF, no SRF, outer retinal atrophy, subretinal hyper-reflective material (Focal CR atrophy and SRHM temporal macula caught on  widefield - stable).   Left Eye Quality was good. Central Foveal Thickness: 555. Progression has improved. Findings include abnormal foveal contour, subretinal hyper-reflective material, intraretinal hyper-reflective material, intraretinal fluid, subretinal fluid, pigment epithelial detachment, epiretinal membrane (On widefield, focal excavated CR scars with adjacent SRF and SRHM IT midzone -- mild interval decrease in SRF surrounding SRHM/CNV).   Notes *Images captured and stored on drive  Diagnosis / Impression:  OD: Focal CR atrophy and SRHM temporal macula caught on widefield -- stable OS: On widefield, focal excavated CR scars with adjacent SRF and SRHM IT midzone -- mild interval improvement in IRF/SRF surrounding SRHM/CNV  Clinical management:  See below  Abbreviations: NFP - Normal foveal profile. CME - cystoid macular edema. PED - pigment epithelial detachment. IRF - intraretinal fluid. SRF - subretinal fluid. EZ - ellipsoid zone. ERM - epiretinal membrane. ORA - outer retinal atrophy. ORT - outer retinal tubulation. SRHM - subretinal hyper-reflective material. IRHM - intraretinal hyper-reflective material            ASSESSMENT/PLAN:    ICD-10-CM   1. Bilateral posterior uveitis  H30.93 OCT, Retina - OU - Both Eyes    2. 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 Diabetes mellitus type 2 without retinopathy ([place])  E11.9 OCT, Retina - OU - Both Eyes    4. Essential hypertension  I10     5. Hypertensive retinopathy of both eyes  H35.033     6. Combined forms of age-related cataract of both eyes  H25.813      1,2. 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, OD inactive  - discussed findings, diagnosis, prognosis, treatment options  - will check toxo, TB and syphilis labs -- all labs came back negative  - ?false negative toxo -- clinical appearance and response to Bactrim + Clinda suggest +toxo  - s/p IVA OS #1 on 02.20.22  - pt reports subjective improvement in vision and visual symptoms, BCVA is stable at 20/150  - OCT shows On widefield, focal excavated CR scars with adjacent SRF and SRHM IT midzone -- mild interval improvement in IRF/SRF surrounding SRHM/CNV  - complete Bactrim and Clindamycin prescriptions -- will stop when pills run out - f/u March 22, DFE, OCT, possible injxn  3. Diabetes mellitus, type 2 without retinopathy - The incidence, risk factors for progression, natural history and treatment options for diabetic retinopathy  were discussed with patient.   - The need for close monitoring of blood glucose, blood pressure, and serum lipids, avoiding cigarette or any type of tobacco, and the need for long term follow up was also discussed with patient.  - monitor  4,5. Hypertensive retinopathy OU - discussed importance of tight BP control - monitor   6. Mixed Cataract OU - The symptoms of cataract, surgical options, and treatments and risks were discussed with patient. - discussed diagnosis and progression - not yet visually significant - monitor for now  Ophthalmic Meds Ordered this visit:  No orders of the defined types were placed in this encounter.   Return in about 19 days (around 05/01/2021) for CNV OS - Dilated Exam, OCT, Possible Injxn.  There are no Patient Instructions on file for this visit.  This document serves as a record of services personally performed by Gardiner Sleeper, MD, PhD. It was created on their behalf by Leonie Douglas, an ophthalmic technician. The  creation of this record is the provider's dictation and/or activities during the visit.    Electronically signed by: Leonie Douglas COA, 04/12/21  9:14 AM  This document serves as a record of services personally performed by Gardiner Sleeper, MD, PhD. It was created on their behalf by San Jetty. Owens Shark, OA an ophthalmic technician. The creation of this record is the provider's dictation and/or activities during the visit.    Electronically signed by: San Jetty. Owens Shark, [place] 03.03.2023 9:14 AM  Gardiner Sleeper, M.D., Ph.D. Diseases & Surgery of the Retina and Vitreous [facility]  I have reviewed the above documentation for accuracy and completeness, and I agree with the above. Gardiner Sleeper, M.D., Ph.D. 04/12/21 9:16 AM   Abbreviations: M myopia (nearsighted); A astigmatism; H hyperopia (farsighted); P presbyopia; Mrx spectacle prescription;  CTL contact lenses; OD right eye; OS left eye; OU both eyes  XT exotropia; ET esotropia; PEK punctate epithelial keratitis; PEE punctate epithelial erosions; DES dry eye syndrome; MGD meibomian gland dysfunction; ATs artificial tears; PFAT's preservative free artificial tears; [place] nuclear sclerotic cataract; PSC posterior subcapsular cataract; ERM epi-retinal membrane; PVD posterior vitreous detachment; RD retinal detachment; DM diabetes mellitus; DR diabetic retinopathy; NPDR non-proliferative diabetic retinopathy; PDR proliferative diabetic retinopathy; CSME clinically significant macular edema; DME diabetic macular edema; dbh dot  blot hemorrhages; CWS cotton wool spot; POAG primary open angle glaucoma; C/D cup-to-disc ratio; HVF humphrey visual field; GVF goldmann visual field; OCT optical coherence tomography; IOP intraocular pressure; BRVO Branch retinal vein occlusion; CRVO central retinal vein occlusion; CRAO central retinal artery occlusion; BRAO branch retinal artery occlusion; RT retinal tear; SB scleral buckle; PPV pars plana  vitrectomy; VH Vitreous hemorrhage; PRP panretinal laser photocoagulation; IVK intravitreal kenalog; VMT vitreomacular traction; MH Macular hole;  NVD neovascularization of the disc; NVE neovascularization elsewhere; AREDS age related eye disease study; ARMD age related macular degeneration; POAG primary open angle glaucoma; EBMD epithelial/anterior basement membrane dystrophy; ACIOL anterior chamber intraocular lens; IOL intraocular lens; PCIOL posterior chamber intraocular lens; Phaco/IOL phacoemulsification with intraocular lens placement; [place] photorefractive keratectomy; LASIK laser assisted in situ keratomileusis; HTN hypertension; DM diabetes mellitus; COPD chronic obstructive pulmonary disease

## 2021-04-12 ENCOUNTER — Encounter (INDEPENDENT_AMBULATORY_CARE_PROVIDER_SITE_OTHER): Payer: Self-pay | Admitting: Ophthalmology

## 2021-04-12 ENCOUNTER — Ambulatory Visit (INDEPENDENT_AMBULATORY_CARE_PROVIDER_SITE_OTHER): Payer: Medicare Other | Admitting: Ophthalmology

## 2021-04-12 ENCOUNTER — Other Ambulatory Visit: Payer: Self-pay

## 2021-04-12 DIAGNOSIS — H25813 Combined forms of age-related cataract, bilateral: Secondary | ICD-10-CM

## 2021-04-12 DIAGNOSIS — H35033 Hypertensive retinopathy, bilateral: Secondary | ICD-10-CM

## 2021-04-12 DIAGNOSIS — I10 Essential (primary) hypertension: Secondary | ICD-10-CM | POA: Diagnosis not present

## 2021-04-12 DIAGNOSIS — H35052 Retinal neovascularization, unspecified, left eye: Secondary | ICD-10-CM

## 2021-04-12 DIAGNOSIS — H3092 Unspecified chorioretinal inflammation, left eye: Secondary | ICD-10-CM

## 2021-04-12 DIAGNOSIS — H3093 Unspecified chorioretinal inflammation, bilateral: Secondary | ICD-10-CM

## 2021-04-12 DIAGNOSIS — E119 Type 2 diabetes mellitus without complications: Secondary | ICD-10-CM

## 2021-04-22 ENCOUNTER — Inpatient Hospital Stay (INDEPENDENT_AMBULATORY_CARE_PROVIDER_SITE_OTHER): Payer: Medicare Other

## 2021-04-22 DIAGNOSIS — I6300 Cerebral infarction due to thrombosis of unspecified precerebral artery: Secondary | ICD-10-CM | POA: Diagnosis not present

## 2021-04-22 LAB — CUP PACEART REMOTE DEVICE CHECK
Date Time Interrogation Session: 20230312230824
Implantable Pulse Generator Implant Date: 20200720

## 2021-05-01 ENCOUNTER — Encounter (INDEPENDENT_AMBULATORY_CARE_PROVIDER_SITE_OTHER): Payer: Medicare Other | Admitting: Ophthalmology

## 2021-05-03 NOTE — Progress Notes (Signed)
Carelink Summary Report / Loop Recorder 

## 2021-05-14 NOTE — Progress Notes (Signed)
?[facility] Note ? ?05/15/2021 ? ?  ? ?CHIEF COMPLAINT ?Patient presents for Retina Follow Up ? ? ?HISTORY OF PRESENT ILLNESS: ?Michelle Brooks is a 56 y.o. female who presents to the clinic today for:  ? ?HPI   ? ? Retina Follow Up   ?Patient presents with  Other.  In both eyes.  This started 5 weeks ago.  I, the attending physician,  performed the HPI with the patient and updated documentation appropriately. ? ?  ?  ? ? Comments   ?Patient here for 5 weeks retina follow up for CNV OS. Patient states vision still driving. Vision is ok. OS still sees a little squiggly but a little straight. No eye pain. A1C 8.5 and blood sugar 77 this am. ? ?  ?  ?Last edited by Bernarda Caffey, MD on 05/15/2021  1:01 PM.  ?  ?Pt states she stopped the antibiotics around March 20, she states she missed 4 or 5 pills ? ?Referring physician: ?Clent Jacks, MD ?[address]  ?[place], [place] [address] ? ?HISTORICAL INFORMATION:  ? ?Selected notes from the [place] ?Referred by Dr. Katy Fitch ?LEE: 03/12/2021 ?Ocular Hx-scars OU ?PMH-DM ?  ? ?CURRENT MEDICATIONS: ?No current outpatient medications on file. (Ophthalmic Drugs)  ? ?No current facility-administered medications for this visit. (Ophthalmic Drugs)  ? ?Current Outpatient Medications (Other)  ?Medication Sig  ? Blood Glucose Monitoring Suppl (ACCU-CHEK AVIVA PLUS) W/DEVICE KIT 1 kit by Does not apply route 3 (three) times daily before meals.  ? Blood Glucose Monitoring Suppl (TRUE METRIX METER) DEVI 1 each by Does not apply route 4 (four) times daily -  before meals and at bedtime.  ? clopidogrel (PLAVIX) 75 MG tablet Take 1 tablet (75 mg total) by mouth daily.  ? Evolocumab (REPATHA SURECLICK) 093 MG/ML SOAJ Inject 140 mg into the skin every 14 (fourteen) days.  ? glucose blood (TRUE METRIX BLOOD GLUCOSE TEST) test strip Use as instructed  ? insulin lispro protamine-lispro (HUMALOG 50/50) (50-50) 100 UNIT/ML SUSP injection Inject 80 Units into the  skin 3 (three) times daily. Up to 60 depending on cbg  ? JANUVIA 100 MG tablet Take 100 mg by mouth daily.  ? Olmesartan Medoxomil (BENICAR PO) Take by mouth. With HCTZ unsure of dosage  ? pravastatin (PRAVACHOL) 80 MG tablet Take 1 tablet (80 mg total) by mouth daily.  ? sulfamethoxazole-trimethoprim (BACTRIM DS) 800-160 MG tablet Take 1 tablet by mouth 2 (two) times daily.  ? tiZANidine (ZANAFLEX) 4 MG capsule Take 4 mg by mouth daily as needed for muscle spasms.  ? TRUEPLUS LANCETS 28G MISC 1 each by Does not apply route 4 (four) times daily -  before meals and at bedtime.  ? valsartan (DIOVAN) 160 MG tablet   ? ondansetron (ZOFRAN ODT) 4 MG disintegrating tablet Take 1 tablet (4 mg total) by mouth every 8 (eight) hours as needed for nausea or vomiting. (Patient not taking: Reported on 04/12/2021)  ? ?No current facility-administered medications for this visit. (Other)  ? ?REVIEW OF SYSTEMS: ?ROS   ?Positive for: Endocrine, Cardiovascular, Eyes ?Negative for: Constitutional, Gastrointestinal, Neurological, Skin, Genitourinary, Musculoskeletal, HENT, Respiratory, Psychiatric, Allergic/Imm, Heme/Lymph ?Last edited by Theodore Demark, COA on 05/15/2021  9:30 AM.  ?  ? ?ALLERGIES ?Allergies  ?Allergen Reactions  ? Lactose Intolerance (Gi) Diarrhea and Nausea And Vomiting  ? Latex   ?  Like a burning in area  ? ?PAST MEDICAL HISTORY ?Past  Medical History:  ?Diagnosis Date  ? Chronic back pain   ? Diabetes mellitus type 2 in obese [facility])   ? Diabetic retinopathy ([place])   ? GERD (gastroesophageal reflux disease)   ? Headache   ? History of stroke 03/2010  ? Right MCA stroke in 03/2010, with MRI also noting subacute strokes in left fronto parietal area - occured in [place] received care at [facility] thought due to uncontrolled blood pressure // No residual deficits // TTE (03/2010) at [facility] - normal LV systolic function, moderate pericardial effusion with diastolic collapse - consistent with pericardial tampanode // TEE (03/2010) -  no cardiac souce of emboli.  ? HSV-2 infection   ? Hyperlipidemia LDL goal < 100 04/01/2011  ? Hypertension   ? Lumbago   ? pain in the back  ? ?Past Surgical History:  ?Procedure Laterality Date  ? BREAST LUMPECTOMY  2002  ? states benign lymph node removed.  ? BUBBLE STUDY  08/30/2018  ? Procedure: BUBBLE STUDY;  Surgeon: Thayer Headings, MD;  Location: [place];  Service: Cardiovascular;;  ? ENDOMETRIAL BIOPSY  10/04/2020  ? LOOP RECORDER INSERTION N/A 08/30/2018  ? Procedure: LOOP RECORDER INSERTION;  Surgeon: Evans Lance, MD;  Location: [place] [facility];  Service: Cardiovascular;  Laterality: N/A;  ? TEE WITHOUT CARDIOVERSION N/A 08/30/2018  ? Procedure: TRANSESOPHAGEAL ECHOCARDIOGRAM (TEE);  Surgeon: Acie Fredrickson Wonda Cheng, MD;  Location: [place];  Service: Cardiovascular;  Laterality: N/A;  ? ?FAMILY HISTORY ?Family History  ?Problem Relation Age of Onset  ? Diabetes Mother   ? Stroke Mother 45  ? Diabetes Brother   ? Diabetes Brother   ? Cancer Brother 84  ?     unknown type  ? Hypertension Brother   ? ?SOCIAL HISTORY ?Social History  ? ?Tobacco Use  ? Smoking status: Former  ?  Packs/day: 0.20  ?  Years: 7.00  ?  Pack years: 1.40  ?  Types: Cigarettes  ?  Quit date: 03/26/2004  ?  Years since quitting: 17.1  ? Smokeless tobacco: Never  ?Vaping Use  ? Vaping Use: Never used  ?Substance Use Topics  ? Alcohol use: No  ? Drug use: No  ?  ? ?  ?OPHTHALMIC EXAM: ? ?Base Eye Exam   ? ? Visual Acuity (Snellen - Linear)   ? ?   Right Left  ? Dist [place] 20/20 -2 20/100 -1  ? Dist ph [place]  20/100 +2  ? ?  ?  ? ? Tonometry (Tonopen, 9:25 AM)   ? ?   Right Left  ? Pressure 13 09  ? ?  ?  ? ? Pupils   ? ?   Dark Light Shape React APD  ? Right 2 1 Round Slow None  ? Left 2 1 Round Slow None  ? ?  ?  ? ? Visual Fields (Counting fingers)   ? ?   Left Right  ?  Full Full  ? ?  ?  ? ? Extraocular Movement   ? ?   Right Left  ?  Full, Ortho Full, Ortho  ? ?  ?  ? ? Neuro/Psych   ? ? Oriented x3: Yes  ? Mood/Affect: Normal  ? ?   ?  ? ? Dilation   ? ? Both eyes: 1.0% Mydriacyl, 2.5% Phenylephrine @ 9:24 AM  ? ?  ?  ? ?  ? ?Slit Lamp and Fundus Exam   ? ? Slit Lamp Exam   ? ?  Right Left  ? Lids/Lashes Dermatochalasis - upper lid, Meibomian gland dysfunction Dermatochalasis - upper lid, Meibomian gland dysfunction  ? Conjunctiva/Sclera Melanosis Melanosis  ? Cornea arcus arcus, trace PEE  ? Anterior Chamber Deep, 0.5+fine pigment, narrow temporal angle Deep, no cell or pigment  ? Iris Round and dilated, No NVI Round and dilated, No NVI  ? Lens 2+ Nuclear sclerosis with early brunescence, 2+ Cortical cataract 2+ Nuclear sclerosis with early brunescence, 2-3+ Cortical cataract  ? Anterior Vitreous Vitreous syneresis, no cell; no vitritis or vitreous haze Vitreous syneresis, no pigment; no vitritis or vitreous haze  ? ?  ?  ? ? Fundus Exam   ? ?   Right Left  ? Disc Pink and Sharp Pink and Sharp, mild PPP  ? C/D Ratio 0.6 0.3  ? Macula Flat, Good foveal reflex, focal pigmented CR scar nasal and temporal macula, No heme or edema Blunted foveal reflex, CNV with surrounding SRF and exudates temporal fovea and macula - slightly improved, large excavated pigmented CR scars temporal and IT macula  ? Vessels attenuated, mild tortuousity attenuated, mild tortuousity, mild AV crossing changes, mild Copper wiring  ? Periphery Attached, focal pigmented CR scars superiorly Attached, focal pigmented CR scars superior midzone  ? ?  ?  ? ?  ? ?IMAGING AND PROCEDURES  ?Imaging and Procedures for 05/15/2021 ? ?OCT, Retina - OU - Both Eyes   ? ?   ?Right Eye ?Quality was good. Central Foveal Thickness: 235. Progression has been stable. Findings include normal foveal contour, no IRF, no SRF, outer retinal atrophy, subretinal hyper-reflective material (Focal CR atrophy and SRHM temporal macula caught on widefield - stable).  ? ?Left Eye ?Quality was good. Central Foveal Thickness: 494. Progression has improved. Findings include abnormal foveal contour, subretinal  hyper-reflective material, intraretinal hyper-reflective material, intraretinal fluid, subretinal fluid, pigment epithelial detachment, epiretinal membrane (On widefield, focal excavated CR scars with

## 2021-05-15 ENCOUNTER — Encounter (INDEPENDENT_AMBULATORY_CARE_PROVIDER_SITE_OTHER): Payer: Self-pay | Admitting: Ophthalmology

## 2021-05-15 ENCOUNTER — Ambulatory Visit (INDEPENDENT_AMBULATORY_CARE_PROVIDER_SITE_OTHER): Payer: Medicare Other | Admitting: Ophthalmology

## 2021-05-15 DIAGNOSIS — H35052 Retinal neovascularization, unspecified, left eye: Secondary | ICD-10-CM

## 2021-05-15 DIAGNOSIS — H35033 Hypertensive retinopathy, bilateral: Secondary | ICD-10-CM | POA: Diagnosis not present

## 2021-05-15 DIAGNOSIS — H25813 Combined forms of age-related cataract, bilateral: Secondary | ICD-10-CM

## 2021-05-15 DIAGNOSIS — I10 Essential (primary) hypertension: Secondary | ICD-10-CM

## 2021-05-15 DIAGNOSIS — E119 Type 2 diabetes mellitus without complications: Secondary | ICD-10-CM

## 2021-05-15 DIAGNOSIS — H3093 Unspecified chorioretinal inflammation, bilateral: Secondary | ICD-10-CM | POA: Diagnosis not present

## 2021-05-15 MED ORDER — BEVACIZUMAB CHEMO INJECTION 1.25MG/0.05ML SYRINGE FOR KALEIDOSCOPE
1.2500 mg | INTRAVITREAL | Status: AC | PRN
  Administered 2021-05-15: 1.25 mg via INTRAVITREAL

## 2021-05-27 ENCOUNTER — Inpatient Hospital Stay (INDEPENDENT_AMBULATORY_CARE_PROVIDER_SITE_OTHER): Payer: Medicare Other

## 2021-05-27 DIAGNOSIS — I6300 Cerebral infarction due to thrombosis of unspecified precerebral artery: Secondary | ICD-10-CM

## 2021-05-27 LAB — CUP PACEART REMOTE DEVICE CHECK
Date Time Interrogation Session: 20230414231137
Implantable Pulse Generator Implant Date: 20200720

## 2021-06-10 NOTE — Progress Notes (Shared)
?[facility] Note ? ?06/19/2021 ? ?  ? ?CHIEF COMPLAINT ?Patient presents for No chief complaint on file. ? ? ?HISTORY OF PRESENT ILLNESS: ?Michelle Brooks is a 56 y.o. female who presents to the clinic today for:  ? ? ? ?Referring physician: ?Clent Jacks, MD ?[address]  ?[place], [place] [address] ? ?HISTORICAL INFORMATION:  ? ?Selected notes from the [place] ?Referred by Dr. Katy Fitch ?LEE: 03/12/2021 ?Ocular Hx-scars OU ?PMH-DM ?  ? ?CURRENT MEDICATIONS: ?No current outpatient medications on file. (Ophthalmic Drugs)  ? ?No current facility-administered medications for this visit. (Ophthalmic Drugs)  ? ?Current Outpatient Medications (Other)  ?Medication Sig  ? Blood Glucose Monitoring Suppl (ACCU-CHEK AVIVA PLUS) W/DEVICE KIT 1 kit by Does not apply route 3 (three) times daily before meals.  ? Blood Glucose Monitoring Suppl (TRUE METRIX METER) DEVI 1 each by Does not apply route 4 (four) times daily -  before meals and at bedtime.  ? clopidogrel (PLAVIX) 75 MG tablet Take 1 tablet (75 mg total) by mouth daily.  ? Evolocumab (REPATHA SURECLICK) 751 MG/ML SOAJ Inject 140 mg into the skin every 14 (fourteen) days.  ? glucose blood (TRUE METRIX BLOOD GLUCOSE TEST) test strip Use as instructed  ? insulin lispro protamine-lispro (HUMALOG 50/50) (50-50) 100 UNIT/ML SUSP injection Inject 80 Units into the skin 3 (three) times daily. Up to 60 depending on cbg  ? JANUVIA 100 MG tablet Take 100 mg by mouth daily.  ? Olmesartan Medoxomil (BENICAR PO) Take by mouth. With HCTZ unsure of dosage  ? ondansetron (ZOFRAN ODT) 4 MG disintegrating tablet Take 1 tablet (4 mg total) by mouth every 8 (eight) hours as needed for nausea or vomiting. (Patient not taking: Reported on 04/12/2021)  ? pravastatin (PRAVACHOL) 80 MG tablet Take 1 tablet (80 mg total) by mouth daily.  ? sulfamethoxazole-trimethoprim (BACTRIM DS) 800-160 MG tablet Take 1 tablet by mouth 2 (two) times daily.  ? tiZANidine  (ZANAFLEX) 4 MG capsule Take 4 mg by mouth daily as needed for muscle spasms.  ? TRUEPLUS LANCETS 28G MISC 1 each by Does not apply route 4 (four) times daily -  before meals and at bedtime.  ? valsartan (DIOVAN) 160 MG tablet   ? ?No current facility-administered medications for this visit. (Other)  ? ?REVIEW OF SYSTEMS: ? ? ?ALLERGIES ?Allergies  ?Allergen Reactions  ? Lactose Intolerance (Gi) Diarrhea and Nausea And Vomiting  ? Latex   ?  Like a burning in area  ? ?PAST MEDICAL HISTORY ?Past Medical History:  ?Diagnosis Date  ? Chronic back pain   ? Diabetes mellitus type 2 in obese [facility])   ? Diabetic retinopathy ([place])   ? GERD (gastroesophageal reflux disease)   ? Headache   ? History of stroke 03/2010  ? Right MCA stroke in 03/2010, with MRI also noting subacute strokes in left fronto parietal area - occured in [place] received care at [facility] thought due to uncontrolled blood pressure // No residual deficits // TTE (03/2010) at [facility] - normal LV systolic function, moderate pericardial effusion with 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  ? HSV-2 infection   ? Hyperlipidemia LDL goal < 100 04/01/2011  ? Hypertension   ? Lumbago   ? pain in the back  ? ?Past Surgical History:  ?Procedure Laterality Date  ? BREAST LUMPECTOMY  2002  ? states benign lymph node removed.  ?  BUBBLE STUDY  08/30/2018  ? Procedure: BUBBLE STUDY;  Surgeon: Thayer Headings, MD;  Location: [place];  Service: Cardiovascular;;  ? ENDOMETRIAL BIOPSY  10/04/2020  ? LOOP RECORDER INSERTION N/A 08/30/2018  ? Procedure: LOOP RECORDER INSERTION;  Surgeon: Evans Lance, MD;  Location: [place] CV LAB;  Service: Cardiovascular;  Laterality: N/A;  ? TEE WITHOUT CARDIOVERSION N/A 08/30/2018  ? Procedure: TRANSESOPHAGEAL ECHOCARDIOGRAM (TEE);  Surgeon: Acie Fredrickson Wonda Cheng, MD;  Location: [place];  Service: Cardiovascular;  Laterality: N/A;  ? ?FAMILY HISTORY ?Family History  ?Problem  Relation Age of Onset  ? Diabetes Mother   ? Stroke Mother 85  ? Diabetes Brother   ? Diabetes Brother   ? Cancer Brother 73  ?     unknown type  ? Hypertension Brother   ? ?SOCIAL HISTORY ?Social History  ? ?Tobacco Use  ? Smoking status: Former  ?  Packs/day: 0.20  ?  Years: 7.00  ?  Pack years: 1.40  ?  Types: Cigarettes  ?  Quit date: 03/26/2004  ?  Years since quitting: 17.2  ? Smokeless tobacco: Never  ?Vaping Use  ? Vaping Use: Never used  ?Substance Use Topics  ? Alcohol use: No  ? Drug use: No  ?  ? ?  ?OPHTHALMIC EXAM: ? ?Not recorded ?  ? ?IMAGING AND PROCEDURES  ?Imaging and Procedures for 06/19/2021 ? ? ?  ?  ? ?  ?ASSESSMENT/PLAN: ? ?No diagnosis found. ? ?1,2. 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 - pt presented with 4-6 mo history of decreased and distorted vision OS ? - exam reveals pigmented CR scars OU (OS > OD) and OS with focal CNV and edema just nasal to large pigmented CR scar temporal macula; no vitritis OU ? - FA 2.1.23 shows +CNV w/ leakage just nasal to large scar, temporal macula ? 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 - discussed findings, diagnosis, prognosis, treatment options ? - toxo, TB and syphilis labs checked -- all labs came back negative ? - ?false negative toxo -- clinical appearance and response to Bactrim + Clinda suggest +toxo ? - completed extended course of Bactrim DS + Clinda -- finished around Apr 29, 2021 ? - s/p IVA OS #1 on 02.20.22, #2 (04.05.23) ? - pt reports subjective improvement in vision and visual symptoms, BCVA 20/100 ?- OCT shows On widefield, focal excavated CR scars with adjacent SRF and SRHM IT midzone -- mild interval improvement in SRF/SRHM temporal fovea and macula ?- recommend IVA OS #3 today, 05.10.23 ?- pt wishes to proceed with injection ?- RBA of procedure discussed, questions answered ?- informed consent obtained and signed ?- see procedure note  ?- f/u 4-5 weeks, DFE, OCT,  possible injxn ? ?3. Diabetes mellitus, type 2 without retinopathy ?- The incidence, risk factors for progression, natural history and treatment options for diabetic retinopathy  were discussed with patient.   ?- The need for close monitoring of blood glucose, blood pressure, and serum lipids, avoiding cigarette or any type of tobacco, and the need for long term follow up was also discussed with patient.  ?- monitor ? ?4,5. Hypertensive retinopathy OU ?- discussed importance of tight BP control ?- monitor  ? ?6. Mixed Cataract OU ?- The symptoms of cataract, surgical options, and treatments and risks were discussed with patient. ?- discussed diagnosis and progression ?- not yet visually significant ?- monitor for now ? ?Ophthalmic Meds Ordered this  visit:  ?No orders of the defined types were placed in this encounter. ?  ?No follow-ups on file. ? ?There are no Patient Instructions on file for this visit. ? ?This document serves as a record of services personally performed by Gardiner Sleeper, MD, PhD. It was created on their behalf by Orvan Falconer, an ophthalmic technician. The creation of this record is the provider's dictation and/or activities during the visit.   ? ?Electronically signed by: Orvan Falconer, OA, 06/10/21  10:02 AM ? ? ? ?Gardiner Sleeper, M.D., Ph.D. ?Diseases & Surgery of the Retina and Vitreous ?[place] ? ?I have reviewed the above documentation for accuracy and completeness, and I agree with the above. Gardiner Sleeper, M.D., Ph.D. 05/15/21 10:02 AM ? ? ?Abbreviations: ?M myopia (nearsighted); A astigmatism; H hyperopia (farsighted); P presbyopia; Mrx spectacle prescription;  CTL contact lenses; OD right eye; OS left eye; OU both eyes  XT exotropia; ET esotropia; PEK punctate epithelial keratitis; PEE punctate epithelial erosions; DES dry eye syndrome; MGD meibomian gland dysfunction; ATs artificial tears; PFAT's preservative free artificial tears; [place] nuclear  sclerotic cataract; PSC posterior subcapsular cataract; ERM epi-retinal membrane; PVD posterior vitreous detachment; RD retinal detachment; DM diabetes mellitus; DR diabetic retinopathy; NPDR non-proliferati

## 2021-06-12 NOTE — Progress Notes (Signed)
Carelink Summary Report / Loop Recorder 

## 2021-06-19 ENCOUNTER — Encounter (INDEPENDENT_AMBULATORY_CARE_PROVIDER_SITE_OTHER): Payer: Medicare Other | Admitting: Ophthalmology

## 2021-06-19 DIAGNOSIS — H3092 Unspecified chorioretinal inflammation, left eye: Secondary | ICD-10-CM

## 2021-06-19 DIAGNOSIS — H25813 Combined forms of age-related cataract, bilateral: Secondary | ICD-10-CM

## 2021-06-19 DIAGNOSIS — H35033 Hypertensive retinopathy, bilateral: Secondary | ICD-10-CM

## 2021-06-19 DIAGNOSIS — I10 Essential (primary) hypertension: Secondary | ICD-10-CM

## 2021-06-19 DIAGNOSIS — H3093 Unspecified chorioretinal inflammation, bilateral: Secondary | ICD-10-CM

## 2021-06-19 DIAGNOSIS — E119 Type 2 diabetes mellitus without complications: Secondary | ICD-10-CM

## 2021-06-19 NOTE — Progress Notes (Signed)
?[facility] Note ? ?06/20/2021 ? ?  ? ?CHIEF COMPLAINT ?Patient presents for Retina Follow Up ? ? ?HISTORY OF PRESENT ILLNESS: ?Michelle Brooks is a 56 y.o. female who presents to the clinic today for:  ? ?HPI   ? ? Retina Follow Up   ?Patient presents with  Other.  In left eye.  This started 5 weeks ago.  I, the attending physician,  performed the HPI with the patient and updated documentation appropriately. ? ?  ?  ? ? Comments   ?Patient here for 5 weeks retina follow up for chorioretinitisi OS. Patient states vision feels like it is getting better. Done with shots. Ready for glasses. The lines are looking straighter. Sometimes has eye pain. Not sensitive to light. Don't see any flickering. Done with shots. Wants to get glasses. ? ?  ?  ?Last edited by Bernarda Caffey, MD on 06/20/2021  9:02 PM.  ?  ?Pt feels like vision in her left eye is better, lines are straighter, she wants to get glasses and see if they can strengthen her vision ? ?Referring physician: ?Clent Jacks, MD ?[address]  ?[place], [place] [address] ? ?HISTORICAL INFORMATION:  ? ?Selected notes from the [facility] ?Referred by Dr. Katy Fitch ?LEE: 03/12/2021 ?Ocular Hx-scars OU ?PMH-DM ?  ? ?CURRENT MEDICATIONS: ?No current outpatient medications on file. (Ophthalmic Drugs)  ? ?No current facility-administered medications for this visit. (Ophthalmic Drugs)  ? ?Current Outpatient Medications (Other)  ?Medication Sig  ? Blood Glucose Monitoring Suppl (ACCU-CHEK AVIVA PLUS) W/DEVICE KIT 1 kit by Does not apply route 3 (three) times daily before meals.  ? Blood Glucose Monitoring Suppl (TRUE METRIX METER) DEVI 1 each by Does not apply route 4 (four) times daily -  before meals and at bedtime.  ? clopidogrel (PLAVIX) 75 MG tablet Take 1 tablet (75 mg total) by mouth daily.  ? Evolocumab (REPATHA SURECLICK) 509 MG/ML SOAJ Inject 140 mg into the skin every 14 (fourteen) days.  ? glucose blood (TRUE METRIX BLOOD GLUCOSE  TEST) test strip Use as instructed  ? insulin lispro protamine-lispro (HUMALOG 50/50) (50-50) 100 UNIT/ML SUSP injection Inject 80 Units into the skin 3 (three) times daily. Up to 60 depending on cbg  ? JANUVIA 100 MG tablet Take 100 mg by mouth daily.  ? Olmesartan Medoxomil (BENICAR PO) Take by mouth. With HCTZ unsure of dosage  ? pravastatin (PRAVACHOL) 80 MG tablet Take 1 tablet (80 mg total) by mouth daily.  ? sulfamethoxazole-trimethoprim (BACTRIM DS) 800-160 MG tablet Take 1 tablet by mouth 2 (two) times daily.  ? tiZANidine (ZANAFLEX) 4 MG capsule Take 4 mg by mouth daily as needed for muscle spasms.  ? TRUEPLUS LANCETS 28G MISC 1 each by Does not apply route 4 (four) times daily -  before meals and at bedtime.  ? valsartan (DIOVAN) 160 MG tablet   ? ondansetron (ZOFRAN ODT) 4 MG disintegrating tablet Take 1 tablet (4 mg total) by mouth every 8 (eight) hours as needed for nausea or vomiting. (Patient not taking: Reported on 04/12/2021)  ? ?No current facility-administered medications for this visit. (Other)  ? ?REVIEW OF SYSTEMS: ?ROS   ?Positive for: Endocrine, Cardiovascular, Eyes ?Negative for: Constitutional, Gastrointestinal, Neurological, Skin, Genitourinary, Musculoskeletal, HENT, Respiratory, Psychiatric, Allergic/Imm, Heme/Lymph ?Last edited by Theodore Demark, COA on 06/20/2021  7:51 AM.  ?  ? ?ALLERGIES ?Allergies  ?Allergen Reactions  ? Lactose Intolerance (Gi) Diarrhea and Nausea And  Vomiting  ? Latex   ?  Like a burning in area  ? ?PAST MEDICAL HISTORY ?Past Medical History:  ?Diagnosis Date  ? Chronic back pain   ? Diabetes mellitus type 2 in obese [facility])   ? Diabetic retinopathy ([place])   ? GERD (gastroesophageal reflux disease)   ? Headache   ? History of stroke 03/2010  ? Right MCA stroke in 03/2010, with MRI also noting subacute strokes in left fronto parietal area - occured in [place] received care at [facility] thought due to uncontrolled blood pressure // No residual deficits // TTE (03/2010) at  [facility] - normal LV systolic function, moderate pericardial effusion with 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  ? HSV-2 infection   ? Hyperlipidemia LDL goal < 100 04/01/2011  ? Hypertension   ? Lumbago   ? pain in the back  ? ?Past Surgical History:  ?Procedure Laterality Date  ? BREAST LUMPECTOMY  2002  ? states benign lymph node removed.  ? BUBBLE STUDY  08/30/2018  ? Procedure: BUBBLE STUDY;  Surgeon: Thayer Headings, MD;  Location: [place];  Service: Cardiovascular;;  ? ENDOMETRIAL BIOPSY  10/04/2020  ? LOOP RECORDER INSERTION N/A 08/30/2018  ? Procedure: LOOP RECORDER INSERTION;  Surgeon: Evans Lance, MD;  Location: [place] CV LAB;  Service: Cardiovascular;  Laterality: N/A;  ? TEE WITHOUT CARDIOVERSION N/A 08/30/2018  ? Procedure: TRANSESOPHAGEAL ECHOCARDIOGRAM (TEE);  Surgeon: Acie Fredrickson Wonda Cheng, MD;  Location: [place];  Service: Cardiovascular;  Laterality: N/A;  ? ?FAMILY HISTORY ?Family History  ?Problem Relation Age of Onset  ? Diabetes Mother   ? Stroke Mother 22  ? Diabetes Brother   ? Diabetes Brother   ? Cancer Brother 65  ?     unknown type  ? Hypertension Brother   ? ?SOCIAL HISTORY ?Social History  ? ?Tobacco Use  ? Smoking status: Former  ?  Packs/day: 0.20  ?  Years: 7.00  ?  Pack years: 1.40  ?  Types: Cigarettes  ?  Quit date: 03/26/2004  ?  Years since quitting: 17.2  ? Smokeless tobacco: Never  ?Vaping Use  ? Vaping Use: Never used  ?Substance Use Topics  ? Alcohol use: No  ? Drug use: No  ?  ? ?  ?OPHTHALMIC EXAM: ? ?Base Eye Exam   ? ? Visual Acuity (Snellen - Linear)   ? ?   Right Left  ? Dist Britt 20/20 20/100 -1  ? Dist ph Pope  20/100 +2  ? ?  ?  ? ? Tonometry (Tonopen, 7:47 AM)   ? ?   Right Left  ? Pressure 15 11  ? ?  ?  ? ? Pupils   ? ?   Dark Light Shape React APD  ? Right 2 1 Round Slow None  ? Left 2 1 Round Slow None  ? ?  ?  ? ? Visual Fields (Counting fingers)   ? ?   Left Right  ?  Full Full  ? ?  ?   ? ? Extraocular Movement   ? ?   Right Left  ?  Full, Ortho Full, Ortho  ? ?  ?  ? ? Neuro/Psych   ? ? Oriented x3: Yes  ? Mood/Affect: Normal  ? ?  ?  ? ? Dilation   ? ? Both eyes: 1.0% Mydriacyl, 2.5% Phenylephrine @ 7:47 AM  ? ?  ?  ? ?  ? ?  Slit Lamp and Fundus Exam   ? ? Slit Lamp Exam   ? ?   Right Left  ? Lids/Lashes Dermatochalasis - upper lid, Meibomian gland dysfunction Dermatochalasis - upper lid, Meibomian gland dysfunction  ? Conjunctiva/Sclera Melanosis Melanosis  ? Cornea arcus arcus, trace PEE  ? Anterior Chamber Deep, 0.5+fine pigment, narrow temporal angle Deep, no cell or pigment  ? Iris Round and dilated, No NVI Round and dilated, No NVI  ? Lens 2+ Nuclear sclerosis with early brunescence, 2+ Cortical cataract 2+ Nuclear sclerosis with early brunescence, 2-3+ Cortical cataract  ? Anterior Vitreous Vitreous syneresis, no cell; no vitritis or vitreous haze Vitreous syneresis, no pigment; no vitritis or vitreous haze  ? ?  ?  ? ? Fundus Exam   ? ?   Right Left  ? Disc Pink and Sharp Pink and Sharp, mild PPP  ? C/D Ratio 0.6 0.3  ? Macula Flat, Good foveal reflex, focal pigmented CR scar nasal and temporal macula, No heme or edema Blunted foveal reflex, CNV with surrounding SRF and exudates temporal fovea and macula - slightly improved, large excavated pigmented CR scars temporal and IT macula  ? Vessels attenuated, mild tortuosity attenuated, mild tortuousity, mild AV crossing changes, mild Copper wiring  ? Periphery Attached, focal pigmented CR scars superiorly Attached, focal pigmented CR scars superior midzone  ? ?  ?  ? ?  ? ?IMAGING AND PROCEDURES  ?Imaging and Procedures for 06/20/2021 ? ?OCT, Retina - OU - Both Eyes   ? ?   ?Right Eye ?Quality was good. Central Foveal Thickness: 232. Progression has been stable. Findings include normal foveal contour, no IRF, no SRF, outer retinal atrophy, subretinal hyper-reflective material (Focal CR atrophy and SRHM temporal macula caught on widefield -  stable).  ? ?Left Eye ?Quality was good. Central Foveal Thickness: 421. Progression has improved. Findings include abnormal foveal contour, subretinal hyper-reflective material, intraretinal hyper-reflective ma

## 2021-06-20 ENCOUNTER — Ambulatory Visit (INDEPENDENT_AMBULATORY_CARE_PROVIDER_SITE_OTHER): Payer: Medicare Other | Admitting: Ophthalmology

## 2021-06-20 ENCOUNTER — Telehealth (INDEPENDENT_AMBULATORY_CARE_PROVIDER_SITE_OTHER): Payer: Self-pay

## 2021-06-20 ENCOUNTER — Encounter (INDEPENDENT_AMBULATORY_CARE_PROVIDER_SITE_OTHER): Payer: Self-pay | Admitting: Ophthalmology

## 2021-06-20 DIAGNOSIS — H35033 Hypertensive retinopathy, bilateral: Secondary | ICD-10-CM

## 2021-06-20 DIAGNOSIS — H3093 Unspecified chorioretinal inflammation, bilateral: Secondary | ICD-10-CM

## 2021-06-20 DIAGNOSIS — H3092 Unspecified chorioretinal inflammation, left eye: Secondary | ICD-10-CM

## 2021-06-20 DIAGNOSIS — H25813 Combined forms of age-related cataract, bilateral: Secondary | ICD-10-CM

## 2021-06-20 DIAGNOSIS — E119 Type 2 diabetes mellitus without complications: Secondary | ICD-10-CM

## 2021-06-20 DIAGNOSIS — I10 Essential (primary) hypertension: Secondary | ICD-10-CM

## 2021-06-20 DIAGNOSIS — H35052 Retinal neovascularization, unspecified, left eye: Secondary | ICD-10-CM | POA: Diagnosis not present

## 2021-06-20 MED ORDER — BEVACIZUMAB CHEMO INJECTION 1.25MG/0.05ML SYRINGE FOR KALEIDOSCOPE
1.2500 mg | INTRAVITREAL | Status: AC | PRN
  Administered 2021-06-20: 1.25 mg via INTRAVITREAL

## 2021-06-20 NOTE — Telephone Encounter (Signed)
Pt scheduled w/ Dr. Zenia Resides office for 08/07/21 @ 10AM. Pt called and made aware. MS ?

## 2021-06-28 LAB — CUP PACEART REMOTE DEVICE CHECK
Date Time Interrogation Session: 20230517232026
Implantable Pulse Generator Implant Date: 20200720

## 2021-07-01 ENCOUNTER — Inpatient Hospital Stay (INDEPENDENT_AMBULATORY_CARE_PROVIDER_SITE_OTHER): Payer: Medicare Other

## 2021-07-01 DIAGNOSIS — I6300 Cerebral infarction due to thrombosis of unspecified precerebral artery: Secondary | ICD-10-CM | POA: Diagnosis not present

## 2021-07-04 ENCOUNTER — Telehealth: Payer: Self-pay | Admitting: *Deleted

## 2021-07-04 NOTE — Telephone Encounter (Signed)
Repatha PA, Key: [id number]. Message:  this medication was previously approved on [id number] from 2021-01-31 to 2022-02-09. **Please note: This request was submitted electronically. Formulary lowering, tiering exception, cost reduction and/or pre-benefit determination review (including prospective Medicare hospice reviews) requests cannot be requested using this method of submission. Providers contact us at [phone] for further assistance.

## 2021-07-15 NOTE — Progress Notes (Shared)
[facility] Note  07/25/2021     CHIEF COMPLAINT Patient presents for No chief complaint on file.   HISTORY OF PRESENT ILLNESS: Michelle Brooks is a 56 y.o. female who presents to the clinic today for:    Pt feels like vision in her left eye is better, lines are straighter, she wants to get glasses and see if they can strengthen her vision  Referring physician: Clent Jacks, MD [address]  [place], [place] [address]  HISTORICAL INFORMATION:   Selected notes from the [facility] Referred by Dr. Katy Fitch LEE: 03/12/2021 Ocular Hx-scars OU PMH-DM    CURRENT MEDICATIONS: No current outpatient medications on file. (Ophthalmic Drugs)   No current facility-administered medications for this visit. (Ophthalmic Drugs)   Current Outpatient Medications (Other)  Medication Sig   Blood Glucose Monitoring Suppl (ACCU-CHEK AVIVA PLUS) W/DEVICE KIT 1 kit by Does not apply route 3 (three) times daily before meals.   Blood Glucose Monitoring Suppl (TRUE METRIX METER) DEVI 1 each by Does not apply route 4 (four) times daily -  before meals and at bedtime.   clopidogrel (PLAVIX) 75 MG tablet Take 1 tablet (75 mg total) by mouth daily.   Evolocumab (REPATHA SURECLICK) 701 MG/ML SOAJ Inject 140 mg into the skin every 14 (fourteen) days.   glucose blood (TRUE METRIX BLOOD GLUCOSE TEST) test strip Use as instructed   insulin lispro protamine-lispro (HUMALOG 50/50) (50-50) 100 UNIT/ML SUSP injection Inject 80 Units into the skin 3 (three) times daily. Up to 60 depending on cbg   JANUVIA 100 MG tablet Take 100 mg by mouth daily.   Olmesartan Medoxomil (BENICAR PO) Take by mouth. With HCTZ unsure of dosage   ondansetron (ZOFRAN ODT) 4 MG disintegrating tablet Take 1 tablet (4 mg total) by mouth every 8 (eight) hours as needed for nausea or vomiting. (Patient not taking: Reported on 04/12/2021)   pravastatin (PRAVACHOL) 80 MG tablet Take 1 tablet (80 mg total) by  mouth daily.   sulfamethoxazole-trimethoprim (BACTRIM DS) 800-160 MG tablet Take 1 tablet by mouth 2 (two) times daily.   tiZANidine (ZANAFLEX) 4 MG capsule Take 4 mg by mouth daily as needed for muscle spasms.   TRUEPLUS LANCETS 28G MISC 1 each by Does not apply route 4 (four) times daily -  before meals and at bedtime.   valsartan (DIOVAN) 160 MG tablet    No current facility-administered medications for this visit. (Other)   REVIEW OF SYSTEMS:   ALLERGIES Allergies  Allergen Reactions   Lactose Intolerance (Gi) Diarrhea and Nausea And Vomiting   Latex     Like a burning in area   PAST MEDICAL HISTORY Past Medical History:  Diagnosis Date   Chronic back pain    Diabetes mellitus type 2 in obese (HCC)    Diabetic retinopathy ([place])    GERD (gastroesophageal reflux disease)    Headache    History of stroke 03/2010   Right MCA stroke in 03/2010, with MRI also noting subacute strokes in left fronto parietal area - occured in [place] received care at [facility] thought due to uncontrolled blood pressure // No residual deficits // TTE (03/2010) at [facility] - normal LV systolic function, moderate pericardial effusion with 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   HSV-2 infection    Hyperlipidemia LDL goal < 100 04/01/2011   Hypertension    Lumbago  pain in the back   Past Surgical History:  Procedure Laterality Date   BREAST LUMPECTOMY  2002   states benign lymph node removed.   BUBBLE STUDY  08/30/2018   Procedure: BUBBLE STUDY;  Surgeon: Thayer Headings, MD;  Location: [place];  Service: Cardiovascular;;   ENDOMETRIAL BIOPSY  10/04/2020   LOOP RECORDER INSERTION N/A 08/30/2018   Procedure: LOOP RECORDER INSERTION;  Surgeon: Evans Lance, MD;  Location: [place] [facility];  Service: Cardiovascular;  Laterality: N/A;   TEE WITHOUT CARDIOVERSION N/A 08/30/2018   Procedure: TRANSESOPHAGEAL ECHOCARDIOGRAM (TEE);  Surgeon:  Acie Fredrickson, Wonda Cheng, MD;  Location: [facility] ENDOSCOPY;  Service: Cardiovascular;  Laterality: N/A;   FAMILY HISTORY Family History  Problem Relation Age of Onset   Diabetes Mother    Stroke Mother 61   Diabetes Brother    Diabetes Brother    Cancer Brother 19       unknown type   Hypertension Brother    SOCIAL HISTORY Social History   Tobacco Use   Smoking status: Former    Packs/day: 0.20    Years: 7.00    Pack years: 1.40    Types: Cigarettes    Quit date: 03/26/2004    Years since quitting: 17.3   Smokeless tobacco: Never  Vaping Use   Vaping Use: Never used  Substance Use Topics   Alcohol use: No   Drug use: No       OPHTHALMIC EXAM:  Not recorded    IMAGING AND PROCEDURES  Imaging and Procedures for 07/25/2021          ASSESSMENT/PLAN:    ICD-10-CM   1. Bilateral posterior uveitis  H30.93     2. 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 Diabetes mellitus type 2 without retinopathy ([place])  E11.9     4. Essential hypertension  I10     5. Hypertensive retinopathy of both eyes  H35.033     6. Combined forms of age-related cataract of both eyes  H25.813      1,2. 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, OD inactive  - discussed findings, diagnosis, prognosis, treatment options  - toxo, TB and syphilis labs checked -- all labs came back negative  - ?false negative toxo -- clinical appearance and response to Bactrim + Clinda suggest +toxo  - completed extended course of Bactrim DS + Clinda -- finished around Apr 29, 2021  -  s/p IVA OS #1 (02.20.23), #2 (04.05.23), #3 (05.11.23)  - pt reports subjective improvement in vision and visual symptoms, BCVA 20/100 - OCT shows focal excavated CR scars with adjacent SRF and SRHM IT midzone -- mild interval improvement in SRF/SRHM temporal fovea and macula - recommend IVA OS #4 today, 06.15.23 - pt wishes to proceed with injection - RBA of procedure discussed, questions answered - informed consent obtained and signed, 02.20.23 - see procedure note - f/u 4-5 weeks, DFE, OCT, possible injxn  3. Diabetes mellitus, type 2 without retinopathy - The incidence, risk factors  for progression, natural history and treatment options for diabetic retinopathy  were discussed with patient.   - The need for close monitoring of blood glucose, blood pressure, and serum lipids, avoiding cigarette or any type of tobacco, and the need for long term follow up was also discussed with patient.  - monitor  4,5. Hypertensive retinopathy OU - discussed importance of tight BP control - monitor   6. Mixed Cataract OU - The symptoms of cataract, surgical options, and treatments and risks were discussed with patient. - discussed diagnosis and progression - not yet visually significant - monitor for now  Ophthalmic Meds Ordered this visit:  No orders of the defined types were placed in this encounter.   No follow-ups on file.  There are no Patient Instructions on file for this visit.  This document serves as a record of services personally performed by Gardiner Sleeper, MD, PhD. It was created on their behalf by San Jetty. Owens Shark, OA an ophthalmic technician. The creation of this record is the provider's dictation and/or activities during the visit.    Electronically signed by: San Jetty. Owens Shark, [place] 06.05.2023 8:03 AM   Gardiner Sleeper, M.D., Ph.D. Diseases & Surgery of the Retina and Vitreous [facility]     Abbreviations: M myopia (nearsighted); A astigmatism; H  hyperopia (farsighted); P presbyopia; Mrx spectacle prescription;  CTL contact lenses; OD right eye; OS left eye; OU both eyes  XT exotropia; ET esotropia; PEK punctate epithelial keratitis; PEE punctate epithelial erosions; DES dry eye syndrome; MGD meibomian gland dysfunction; ATs artificial tears; PFAT's preservative free artificial tears; Economy nuclear sclerotic cataract; PSC posterior subcapsular cataract; ERM epi-retinal membrane; PVD posterior vitreous detachment; RD retinal detachment; DM diabetes mellitus; DR diabetic retinopathy; NPDR non-proliferative diabetic retinopathy; PDR proliferative diabetic retinopathy; CSME clinically significant macular edema; DME diabetic macular edema; dbh dot blot hemorrhages; CWS cotton wool spot; POAG primary open angle glaucoma; C/D cup-to-disc ratio; HVF humphrey visual field; GVF goldmann visual field; OCT optical coherence tomography; IOP intraocular pressure; BRVO Branch retinal vein occlusion; CRVO central retinal vein occlusion; CRAO central retinal artery occlusion; BRAO branch retinal artery occlusion; RT retinal tear; SB scleral buckle; PPV pars plana vitrectomy; VH Vitreous hemorrhage; PRP panretinal laser photocoagulation; IVK intravitreal kenalog; VMT vitreomacular traction; MH Macular hole;  NVD neovascularization of the disc; NVE neovascularization elsewhere; AREDS age related eye disease study; ARMD age related macular degeneration; POAG primary open angle glaucoma; EBMD epithelial/anterior basement membrane dystrophy; ACIOL anterior chamber intraocular lens; IOL intraocular lens; PCIOL posterior chamber intraocular lens; Phaco/IOL phacoemulsification with intraocular lens placement; Rentchler photorefractive keratectomy; LASIK laser assisted in situ keratomileusis; HTN hypertension; DM diabetes mellitus; COPD chronic obstructive pulmonary disease

## 2021-07-18 NOTE — Progress Notes (Signed)
Carelink Summary Report / Loop Recorder 

## 2021-07-25 ENCOUNTER — Encounter (INDEPENDENT_AMBULATORY_CARE_PROVIDER_SITE_OTHER): Payer: Medicare Other | Admitting: Ophthalmology

## 2021-07-25 DIAGNOSIS — H25813 Combined forms of age-related cataract, bilateral: Secondary | ICD-10-CM

## 2021-07-25 DIAGNOSIS — I10 Essential (primary) hypertension: Secondary | ICD-10-CM

## 2021-07-25 DIAGNOSIS — H35033 Hypertensive retinopathy, bilateral: Secondary | ICD-10-CM

## 2021-07-25 DIAGNOSIS — H3093 Unspecified chorioretinal inflammation, bilateral: Secondary | ICD-10-CM

## 2021-07-25 DIAGNOSIS — E119 Type 2 diabetes mellitus without complications: Secondary | ICD-10-CM

## 2021-07-25 DIAGNOSIS — H3092 Unspecified chorioretinal inflammation, left eye: Secondary | ICD-10-CM

## 2021-08-05 ENCOUNTER — Inpatient Hospital Stay (INDEPENDENT_AMBULATORY_CARE_PROVIDER_SITE_OTHER): Payer: Medicare Other

## 2021-08-05 DIAGNOSIS — I6300 Cerebral infarction due to thrombosis of unspecified precerebral artery: Secondary | ICD-10-CM | POA: Diagnosis not present

## 2021-08-06 LAB — CUP PACEART REMOTE DEVICE CHECK
Date Time Interrogation Session: 20230619232451
Implantable Pulse Generator Implant Date: 20200720

## 2021-08-07 ENCOUNTER — Ambulatory Visit (INDEPENDENT_AMBULATORY_CARE_PROVIDER_SITE_OTHER): Payer: Medicare Other | Admitting: Dermatology

## 2021-08-07 ENCOUNTER — Encounter: Payer: Self-pay | Admitting: Dermatology

## 2021-08-07 DIAGNOSIS — L669 Cicatricial alopecia, unspecified: Secondary | ICD-10-CM

## 2021-08-07 MED ORDER — CLOBETASOL PROPIONATE 0.05 % EX FOAM: Freq: Two times a day (BID) | CUTANEOUS | 1 refills | Status: DC

## 2021-08-29 NOTE — Progress Notes (Signed)
Carelink Summary Report / Loop Recorder 

## 2021-09-05 LAB — CUP PACEART REMOTE DEVICE CHECK
Date Time Interrogation Session: 20230722233548
Implantable Pulse Generator Implant Date: 20200720

## 2021-09-07 ENCOUNTER — Encounter: Payer: Self-pay | Admitting: Dermatology

## 2021-09-07 NOTE — Progress Notes (Signed)
   New Patient   Subjective  Michelle Brooks is a 56 y.o. female who presents for the following: New Patient (Initial Visit) (Patient here today for hair loss x years patient states that she believes the use of the relaxer's caused her hair loss. Per patient she stopped getting relaxer's and got twisted up her hair in dreadlocks. Patient states she's been using something called black widow and she feels like it's helping some. Per patient her mother did start having hair loss before she passed away. Per patient she hasn't been prescribed anything for her hair loss, patient does take Biotin for hair and nails. ).  Progressive hair loss for several years front top of scalp Location:  Duration:  Quality:  Associated Signs/Symptoms: Modifying Factors:  Severity:  Timing: Context:    The following portions of the chart were reviewed this encounter and updated as appropriate:  Tobacco  Allergies  Meds  Problems  Med Hx  Surg Hx  Fam Hx      Objective  Well appearing patient in no apparent distress; mood and affect are within normal limits. Scalp Significant loss of density with follicular dropout frontal.  Changes suggestive scalp, ears, face, nailbeds.  Some bogginess palpable mainly over crown.  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  I discussed some detail what is known as well as how much is about his cardiology issues.    A focused examination was performed including scalp, face, ears, nailbeds. Relevant physical exam findings are noted in the Assessment and Plan.   Assessment & Plan  Scarring alopecia Scalp  Patient will start with prescription of Clobetasol Foam x 12 weeks. Discussed with patient about trying Plaquenil if the Clobetasol Foam fails. Spoke with patient about seeing Dr. Isaac Laud in the future.   clobetasol (OLUX) 0.05 % topical foam - Scalp Apply topically 2 (two)  times daily.

## 2021-09-09 ENCOUNTER — Inpatient Hospital Stay (INDEPENDENT_AMBULATORY_CARE_PROVIDER_SITE_OTHER): Payer: Medicare Other

## 2021-09-09 DIAGNOSIS — I6300 Cerebral infarction due to thrombosis of unspecified precerebral artery: Secondary | ICD-10-CM

## 2021-10-12 NOTE — Progress Notes (Signed)
Carelink Summary Report / Loop Recorder 

## 2021-10-14 LAB — CUP PACEART REMOTE DEVICE CHECK
Date Time Interrogation Session: 20230831212155
Implantable Pulse Generator Implant Date: 20200720

## 2021-10-15 ENCOUNTER — Inpatient Hospital Stay (INDEPENDENT_AMBULATORY_CARE_PROVIDER_SITE_OTHER): Payer: Medicare Other

## 2021-10-15 DIAGNOSIS — I639 Cerebral infarction, unspecified: Secondary | ICD-10-CM | POA: Diagnosis not present

## 2021-11-06 NOTE — Progress Notes (Signed)
Carelink Summary Report / Loop Recorder 

## 2021-11-11 ENCOUNTER — Telehealth: Payer: Self-pay | Admitting: Internal Medicine

## 2021-11-11 NOTE — Telephone Encounter (Signed)
New Message:    Patient says she would like to have her Loop Recorder removed asap please. She said she thought it should already been removed.

## 2021-11-11 NOTE — Telephone Encounter (Signed)
Spoke to patient about ILR, advised the battery is still working and if she explants device will no longer work. Patient voiced understanding and still would like to have device explanted. Advised patient I will forward to scheduler who will call with apt. Patient agreeable to plan and voiced understanding.

## 2021-11-13 LAB — CUP PACEART REMOTE DEVICE CHECK
Date Time Interrogation Session: 20231002232432
Implantable Pulse Generator Implant Date: 20200720

## 2021-11-18 ENCOUNTER — Inpatient Hospital Stay (INDEPENDENT_AMBULATORY_CARE_PROVIDER_SITE_OTHER): Payer: Medicare Other

## 2021-11-18 DIAGNOSIS — I639 Cerebral infarction, unspecified: Secondary | ICD-10-CM | POA: Diagnosis not present

## 2021-11-27 NOTE — Progress Notes (Signed)
Carelink Summary Report / Loop Recorder 

## 2021-12-09 ENCOUNTER — Ambulatory Visit: Payer: Medicare Other | Attending: Internal Medicine | Admitting: Internal Medicine

## 2021-12-09 VITALS — BP 152/88 | HR 82 | Wt 252.1 lb

## 2021-12-09 DIAGNOSIS — I10 Essential (primary) hypertension: Secondary | ICD-10-CM | POA: Diagnosis not present

## 2021-12-09 DIAGNOSIS — I63039 Cerebral infarction due to thrombosis of unspecified carotid artery: Secondary | ICD-10-CM | POA: Diagnosis not present

## 2021-12-09 NOTE — Patient Instructions (Addendum)
Medication Instructions:  Your physician recommends that you continue on your current medications as directed. Please refer to the Current Medication list given to you today.  Labwork: None ordered.  Testing/Procedures: None ordered.  Follow-Up:  Your physician wants you to follow-up in: one year with Dr. Lovena Le, or an APP.  You will receive a reminder letter in the mail two months in advance. If you don't receive a letter, please call our office to schedule the follow-up appointment.    Implantable Loop Recorder Removal, Care After This sheet gives you information about how to care for yourself after your procedure. Your health care provider may also give you more specific instructions. If you have problems or questions, contact your health care provider. What can I expect after the procedure? After the procedure, it is common to have: Soreness or discomfort near the incision. Some swelling or bruising near the incision.  Follow these instructions at home: Incision care  Monitor your cardiac device site for redness, swelling, and drainage. Call the device clinic at [phone] if you experience these symptoms or fever/chills.  Keep the large square bandage on your site for 24 hours and then you may remove it yourself. Keep the steri-strips underneath in place.   You may shower after 72 hours / 3 days from your procedure with the steri-strips in place. They will usually fall off on their own, or may be removed after 10 days. Pat dry.   Avoid lotions, ointments, or perfumes over your incision until it is well-healed.  Please do not submerge in water until your site is completely healed.   If your wound site starts to bleed apply pressure.       If you have any questions/concerns please call the device clinic at [phone].  Activity  Return to your normal activities.  Contact a health care provider if: You have redness, swelling, or pain around your incision. You have  a fever.

## 2021-12-09 NOTE — Progress Notes (Signed)
HPI [name] returns today for followup. She is a pleasant 56 yo woman with a h/o cryptogenic stroke, s/p ILR insertion with the device at RRT. She presents today to have her ILR removed. She denies chest pain or sob.  Allergies  Allergen Reactions   Lactose Intolerance (Gi) Diarrhea and Nausea And Vomiting   Latex     Like a burning in area     Current Outpatient Medications  Medication Sig Dispense Refill   aspirin EC 81 MG tablet Take by mouth.     Blood Glucose Monitoring Suppl (ACCU-CHEK AVIVA PLUS) W/DEVICE KIT 1 kit by Does not apply route 3 (three) times daily before meals. 1 kit 0   Blood Glucose Monitoring Suppl (TRUE METRIX METER) DEVI 1 each by Does not apply route 4 (four) times daily -  before meals and at bedtime. 1 Device 0   clobetasol (OLUX) 0.05 % topical foam Apply topically 2 (two) times daily. 50 g 1   glucose blood (TRUE METRIX BLOOD GLUCOSE TEST) test strip Use as instructed 100 each 5   insulin lispro protamine-lispro (HUMALOG 50/50) (50-50) 100 UNIT/ML SUSP injection Inject 80 Units into the skin 3 (three) times daily. Up to 60 depending on cbg     JANUVIA 100 MG tablet Take 100 mg by mouth daily.     pravastatin (PRAVACHOL) 80 MG tablet Take 1 tablet (80 mg total) by mouth daily. 30 tablet 5   TRUEPLUS LANCETS 28G MISC 1 each by Does not apply route 4 (four) times daily -  before meals and at bedtime. 264 each 5   TRULICITY 4.5 BR/8.3EN SOPN      valsartan (DIOVAN) 160 MG tablet      No current facility-administered medications for this visit.     Past Medical History:  Diagnosis Date   Chronic back pain    Diabetes mellitus type 2 in obese (HCC)    Diabetic retinopathy ([place])    GERD (gastroesophageal reflux disease)    Headache    History of stroke 03/2010   Right MCA stroke in 03/2010, with MRI also noting subacute strokes in left fronto parietal area - occured in [place] received care at [facility] thought due to uncontrolled blood pressure // No  residual deficits // [facility]TE (03/2010) at [facility] - normal LV systolic function, moderate pericardial effusion with 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   HSV-2 infection    Hyperlipidemia LDL goal < 100 04/01/2011   Hypertension    Lumbago    pain in the back    ROS:   All systems reviewed and negative except as noted in the HPI.   Past Surgical History:  Procedure Laterality Date   BREAST LUMPECTOMY  2002   states benign lymph node removed.   BUBBLE STUDY  08/30/2018   Procedure: BUBBLE STUDY;  Surgeon: Thayer Headings, MD;  Location: [place];  Service: Cardiovascular;;   ENDOMETRIAL BIOPSY  10/04/2020   LOOP RECORDER INSERTION N/A 08/30/2018   Procedure: LOOP RECORDER INSERTION;  Surgeon: Evans Lance, MD;  Location: [place] CV [facility];  Service: Cardiovascular;  Laterality: N/A;   TEE WITHOUT CARDIOVERSION N/A 08/30/2018   Procedure: TRANSESOPHAGEAL ECHOCARDIOGRAM (TEE);  Surgeon: Acie Fredrickson Wonda Cheng, MD;  Location: [facility] ENDOSCOPY;  Service: Cardiovascular;  Laterality: N/A;     Family History  Problem Relation Age of Onset   Diabetes Mother    Stroke Mother 87  Diabetes Brother    Diabetes Brother    Cancer Brother 87       unknown type   Hypertension Brother      Social History   Socioeconomic History   Marital status: Divorced    Spouse name: Not on file   Number of children: 3   Years of education: 12th grade   Highest education level: Not on file  Occupational History   Occupation: unemployed    Comment: previously worked in [place] before her stroke  Tobacco Use   Smoking status: Former    Packs/day: 0.20    Years: 7.00    Total pack years: 1.40    Types: Cigarettes    Quit date: 03/26/2004    Years since quitting: 17.7   Smokeless tobacco: Never  Vaping Use   Vaping Use: Never used  Substance and Sexual Activity   Alcohol use: No   Drug use: No   Sexual activity: Yes     Partners: Male    Birth control/protection: Condom  Other Topics Concern   Not on file  Social History Narrative   Recently moved from [place] in 10/2010 due to wanting a change from stressors.   Previously followed by Dr. Wynelle Bourgeois at [place] Strain: Not on file  Food Insecurity: Not on file  Transportation Needs: Not on file  Physical Activity: Not on file  Stress: Not on file  Social Connections: Not on file  Intimate Partner Violence: Not on file     BP (!) 152/88 (BP Location: Left Arm)   Pulse 82   Wt 252 lb 0.8 oz (114.3 kg)   LMP 03/30/2016   BMI 38.32 kg/m   Physical Exam:  Well appearing NAD HEENT: Unremarkable Neck:  No JVD, no thyromegally Lymphatics:  No adenopathy Back:  No CVA tenderness Lungs:  Clear with no wheezes HEART:  Regular rate rhythm, no murmurs, no rubs, no clicks Abd:  soft, positive bowel sounds, no organomegally, no rebound, no guarding Ext:  2 plus pulses, no edema, no cyanosis, no clubbing Skin:  No rashes no nodules Neuro:  CN II through XII intact, motor grossly intact  DEVICE  Normal device function.  See PaceArt for details. No atrial fib  Assess/Plan:  Cryptogenic stroke. - no atrial fib. He has no limit to activity. ILR - her device has reached RRT and she would like to have her ILR removed.  EP Procedure note After informed consent was obtained, the patient was prepped and draped in a sterile fashion. 10 cc of lidocaine was infiltrated. A one cm stab incision was carrie dout.  A combination of blunt and sharp dissection was utilized. The ILR was grasped. The scar tissue was freed up from the pocket and removed with gentle traction. A bandage was placed. The patient recovered in the usual manner.  Complications: none immediately.  Conclusion : successful ILR removal without complication.   Michelle Brooks Michelle Felicetti,MD

## 2021-12-21 LAB — CUP PACEART REMOTE DEVICE CHECK
Date Time Interrogation Session: 20231104231949
Implantable Pulse Generator Implant Date: 20200720

## 2021-12-23 ENCOUNTER — Inpatient Hospital Stay (INDEPENDENT_AMBULATORY_CARE_PROVIDER_SITE_OTHER): Payer: Medicare Other

## 2021-12-23 DIAGNOSIS — I63039 Cerebral infarction due to thrombosis of unspecified carotid artery: Secondary | ICD-10-CM | POA: Diagnosis not present

## 2022-02-04 NOTE — Progress Notes (Signed)
Carelink Summary Report / Loop Recorder 

## 2022-02-26 ENCOUNTER — Other Ambulatory Visit: Payer: Self-pay

## 2022-02-26 ENCOUNTER — Encounter (HOSPITAL_COMMUNITY): Payer: Self-pay

## 2022-02-26 ENCOUNTER — Emergency Department (HOSPITAL_COMMUNITY): Payer: 59

## 2022-02-26 ENCOUNTER — Inpatient Hospital Stay (HOSPITAL_COMMUNITY)
Admission: EM | Admit: 2022-02-26 | Discharge: 2022-03-07 | DRG: 064 | Disposition: A | Discharge status: Rehabilitation Facility | Payer: 59 | Attending: Family Medicine | Admitting: Family Medicine

## 2022-02-26 DIAGNOSIS — G8321 Monoplegia of upper limb affecting right dominant side: Secondary | ICD-10-CM | POA: Diagnosis present

## 2022-02-26 DIAGNOSIS — E785 Hyperlipidemia, unspecified: Secondary | ICD-10-CM | POA: Diagnosis present

## 2022-02-26 DIAGNOSIS — I708 Atherosclerosis of other arteries: Secondary | ICD-10-CM | POA: Diagnosis present

## 2022-02-26 DIAGNOSIS — Z809 Family history of malignant neoplasm, unspecified: Secondary | ICD-10-CM

## 2022-02-26 DIAGNOSIS — I63442 Cerebral infarction due to embolism of left cerebellar artery: Secondary | ICD-10-CM | POA: Diagnosis not present

## 2022-02-26 DIAGNOSIS — Z8673 Personal history of transient ischemic attack (TIA), and cerebral infarction without residual deficits: Secondary | ICD-10-CM

## 2022-02-26 DIAGNOSIS — Z794 Long term (current) use of insulin: Secondary | ICD-10-CM

## 2022-02-26 DIAGNOSIS — Z823 Family history of stroke: Secondary | ICD-10-CM

## 2022-02-26 DIAGNOSIS — I10 Essential (primary) hypertension: Secondary | ICD-10-CM | POA: Diagnosis present

## 2022-02-26 DIAGNOSIS — E669 Obesity, unspecified: Secondary | ICD-10-CM | POA: Diagnosis present

## 2022-02-26 DIAGNOSIS — R29707 NIHSS score 7: Secondary | ICD-10-CM | POA: Diagnosis present

## 2022-02-26 DIAGNOSIS — I129 Hypertensive chronic kidney disease with stage 1 through stage 4 chronic kidney disease, or unspecified chronic kidney disease: Secondary | ICD-10-CM | POA: Diagnosis present

## 2022-02-26 DIAGNOSIS — E1165 Type 2 diabetes mellitus with hyperglycemia: Secondary | ICD-10-CM | POA: Diagnosis present

## 2022-02-26 DIAGNOSIS — Z8249 Family history of ischemic heart disease and other diseases of the circulatory system: Secondary | ICD-10-CM

## 2022-02-26 DIAGNOSIS — Z7984 Long term (current) use of oral hypoglycemic drugs: Secondary | ICD-10-CM

## 2022-02-26 DIAGNOSIS — T781XXA Other adverse food reactions, not elsewhere classified, initial encounter: Secondary | ICD-10-CM | POA: Diagnosis present

## 2022-02-26 DIAGNOSIS — Z7982 Long term (current) use of aspirin: Secondary | ICD-10-CM

## 2022-02-26 DIAGNOSIS — Z7902 Long term (current) use of antithrombotics/antiplatelets: Secondary | ICD-10-CM

## 2022-02-26 DIAGNOSIS — R220 Localized swelling, mass and lump, head: Secondary | ICD-10-CM | POA: Diagnosis present

## 2022-02-26 DIAGNOSIS — E1122 Type 2 diabetes mellitus with diabetic chronic kidney disease: Secondary | ICD-10-CM | POA: Diagnosis present

## 2022-02-26 DIAGNOSIS — R29810 Facial weakness: Secondary | ICD-10-CM | POA: Diagnosis present

## 2022-02-26 DIAGNOSIS — G8929 Other chronic pain: Secondary | ICD-10-CM | POA: Diagnosis present

## 2022-02-26 DIAGNOSIS — R1110 Vomiting, unspecified: Secondary | ICD-10-CM | POA: Diagnosis present

## 2022-02-26 DIAGNOSIS — Z6841 Body Mass Index (BMI) 40.0 and over, adult: Secondary | ICD-10-CM

## 2022-02-26 DIAGNOSIS — R270 Ataxia, unspecified: Secondary | ICD-10-CM | POA: Diagnosis present

## 2022-02-26 DIAGNOSIS — M545 Low back pain, unspecified: Secondary | ICD-10-CM | POA: Diagnosis present

## 2022-02-26 DIAGNOSIS — R471 Dysarthria and anarthria: Secondary | ICD-10-CM | POA: Diagnosis present

## 2022-02-26 DIAGNOSIS — Z91040 Latex allergy status: Secondary | ICD-10-CM

## 2022-02-26 DIAGNOSIS — I639 Cerebral infarction, unspecified: Secondary | ICD-10-CM | POA: Diagnosis not present

## 2022-02-26 DIAGNOSIS — E11319 Type 2 diabetes mellitus with unspecified diabetic retinopathy without macular edema: Secondary | ICD-10-CM | POA: Diagnosis present

## 2022-02-26 DIAGNOSIS — E1169 Type 2 diabetes mellitus with other specified complication: Secondary | ICD-10-CM | POA: Diagnosis present

## 2022-02-26 DIAGNOSIS — M25512 Pain in left shoulder: Secondary | ICD-10-CM | POA: Diagnosis present

## 2022-02-26 DIAGNOSIS — E739 Lactose intolerance, unspecified: Secondary | ICD-10-CM | POA: Diagnosis present

## 2022-02-26 DIAGNOSIS — I6329 Cerebral infarction due to unspecified occlusion or stenosis of other precerebral arteries: Secondary | ICD-10-CM | POA: Diagnosis present

## 2022-02-26 DIAGNOSIS — N1831 Chronic kidney disease, stage 3a: Secondary | ICD-10-CM | POA: Diagnosis present

## 2022-02-26 DIAGNOSIS — I471 Supraventricular tachycardia, unspecified: Secondary | ICD-10-CM | POA: Diagnosis not present

## 2022-02-26 DIAGNOSIS — R1312 Dysphagia, oropharyngeal phase: Secondary | ICD-10-CM | POA: Diagnosis present

## 2022-02-26 DIAGNOSIS — Z833 Family history of diabetes mellitus: Secondary | ICD-10-CM

## 2022-02-26 DIAGNOSIS — K219 Gastro-esophageal reflux disease without esophagitis: Secondary | ICD-10-CM | POA: Diagnosis present

## 2022-02-26 DIAGNOSIS — Z79899 Other long term (current) drug therapy: Secondary | ICD-10-CM

## 2022-02-26 DIAGNOSIS — Z87891 Personal history of nicotine dependence: Secondary | ICD-10-CM

## 2022-02-26 DIAGNOSIS — I509 Heart failure, unspecified: Secondary | ICD-10-CM | POA: Diagnosis present

## 2022-02-26 DIAGNOSIS — G8194 Hemiplegia, unspecified affecting left nondominant side: Secondary | ICD-10-CM | POA: Diagnosis present

## 2022-02-26 DIAGNOSIS — R7989 Other specified abnormal findings of blood chemistry: Secondary | ICD-10-CM | POA: Diagnosis not present

## 2022-02-26 DIAGNOSIS — T783XXA Angioneurotic edema, initial encounter: Secondary | ICD-10-CM | POA: Diagnosis present

## 2022-02-26 DIAGNOSIS — X58XXXA Exposure to other specified factors, initial encounter: Secondary | ICD-10-CM | POA: Diagnosis present

## 2022-02-26 DIAGNOSIS — R112 Nausea with vomiting, unspecified: Principal | ICD-10-CM

## 2022-02-26 DIAGNOSIS — H02402 Unspecified ptosis of left eyelid: Secondary | ICD-10-CM | POA: Diagnosis present

## 2022-02-26 LAB — CBC
HCT: 47 % — ABNORMAL HIGH (ref 36.0–46.0)
Hemoglobin: 14.8 g/dL (ref 12.0–15.0)
MCH: 28.2 pg (ref 26.0–34.0)
MCHC: 31.5 g/dL (ref 30.0–36.0)
MCV: 89.7 fL (ref 80.0–100.0)
Platelets: 288 10*3/uL (ref 150–400)
RBC: 5.24 MIL/uL — ABNORMAL HIGH (ref 3.87–5.11)
RDW: 13.3 % (ref 11.5–15.5)
WBC: 10.4 10*3/uL (ref 4.0–10.5)
nRBC: 0 % (ref 0.0–0.2)

## 2022-02-26 LAB — COMPREHENSIVE METABOLIC PANEL
ALT: 18 U/L (ref 0–44)
AST: 18 U/L (ref 15–41)
Albumin: 3.8 g/dL (ref 3.5–5.0)
Alkaline Phosphatase: 93 U/L (ref 38–126)
Anion gap: 14 (ref 5–15)
BUN: 14 mg/dL (ref 6–20)
CO2: 19 mmol/L — ABNORMAL LOW (ref 22–32)
Calcium: 9.4 mg/dL (ref 8.9–10.3)
Chloride: 106 mmol/L (ref 98–111)
Creatinine, Ser: 1.09 mg/dL — ABNORMAL HIGH (ref 0.44–1.00)
GFR, Estimated: 60 mL/min — ABNORMAL LOW (ref >60)
Glucose, Bld: 164 mg/dL — ABNORMAL HIGH (ref 70–99)
Potassium: 3.8 mmol/L (ref 3.5–5.1)
Sodium: 139 mmol/L (ref 135–145)
Total Bilirubin: 0.2 mg/dL — ABNORMAL LOW (ref 0.3–1.2)
Total Protein: 7.2 g/dL (ref 6.5–8.1)

## 2022-02-26 LAB — LIPASE, BLOOD: Lipase: 40 U/L (ref 11–51)

## 2022-02-26 MED ORDER — HYDRALAZINE HCL 25 MG PO TABS
25.0000 mg | ORAL_TABLET | Freq: Once | ORAL | Status: AC
  Administered 2022-02-26: 25 mg via ORAL
  Filled 2022-02-26: qty 1

## 2022-02-26 MED ORDER — ONDANSETRON 4 MG PO TBDP
4.0000 mg | ORAL_TABLET | Freq: Once | ORAL | Status: AC
  Administered 2022-02-26: 4 mg via ORAL
  Filled 2022-02-26: qty 1

## 2022-02-26 MED ORDER — DIPHENHYDRAMINE HCL 50 MG/ML IJ SOLN
25.0000 mg | Freq: Once | INTRAMUSCULAR | Status: AC
  Administered 2022-02-27: 25 mg via INTRAVENOUS
  Filled 2022-02-26: qty 1

## 2022-02-26 NOTE — ED Provider Triage Note (Signed)
Emergency Medicine Provider Triage Evaluation Note  KENNIS BUELL , a 57 y.o. female  was evaluated in triage.  Patient complains of abdominal pain, nausea and vomiting.  Started today.  Reports eating strep yesterday and she normally does not eat shrimp.  No known sick contacts.  Also impressively hypertensive in the lobby.  Reports a history of hypertension and she has been adherent to her medications.  No chest pain, blurred vision or headache  Review of Systems  Positive:  Negative:   Physical Exam  BP (!) 209/104 (BP Location: Right Arm)   Pulse 68   Temp 98.5 F (36.9 C) (Oral)   Resp 16   Ht 5\' 8"  (1.727 m)   Wt 113.4 kg   LMP 03/30/2016   SpO2 100%   BMI 38.01 kg/m  Gen:   Awake, no distress   Resp:  Normal effort  MSK:   Moves extremities without difficulty  Other:  Actively vomiting, generally tender abdomen  Medical Decision Making  Medically screening exam initiated at 12:19 PM.  Appropriate orders placed.  Mayme Genta was informed that the remainder of the evaluation will be completed by another provider, this initial triage assessment does not replace that evaluation, and the importance of remaining in the ED until their evaluation is complete.    Zofran and hydralazine given in triage   Rhae Hammock, PA-C 02/26/22 1220

## 2022-02-26 NOTE — ED Notes (Signed)
Patient re-evaluated by PA.

## 2022-02-26 NOTE — ED Triage Notes (Addendum)
Pt came in POV d/t feeling weird on Lt side of face, feeling numb & vomiting after eating shrimp yesterday. No decreased sensation on hands, strength equal, A/Ox4. Pt reports abd pain that continues now that makes her feel sick.

## 2022-02-26 NOTE — ED Notes (Signed)
Patient evaluated by Dr. Langston Masker EDP at triage.

## 2022-02-26 NOTE — ED Notes (Signed)
Pt did not answer for vital update called x3

## 2022-02-27 ENCOUNTER — Observation Stay (HOSPITAL_COMMUNITY): Payer: 59

## 2022-02-27 ENCOUNTER — Observation Stay (HOSPITAL_BASED_OUTPATIENT_CLINIC_OR_DEPARTMENT_OTHER): Payer: 59

## 2022-02-27 ENCOUNTER — Emergency Department (HOSPITAL_COMMUNITY): Payer: 59

## 2022-02-27 DIAGNOSIS — G8321 Monoplegia of upper limb affecting right dominant side: Secondary | ICD-10-CM | POA: Diagnosis present

## 2022-02-27 DIAGNOSIS — Z794 Long term (current) use of insulin: Secondary | ICD-10-CM | POA: Diagnosis not present

## 2022-02-27 DIAGNOSIS — I639 Cerebral infarction, unspecified: Secondary | ICD-10-CM

## 2022-02-27 DIAGNOSIS — Z809 Family history of malignant neoplasm, unspecified: Secondary | ICD-10-CM | POA: Diagnosis not present

## 2022-02-27 DIAGNOSIS — G8929 Other chronic pain: Secondary | ICD-10-CM | POA: Diagnosis present

## 2022-02-27 DIAGNOSIS — R1312 Dysphagia, oropharyngeal phase: Secondary | ICD-10-CM | POA: Diagnosis present

## 2022-02-27 DIAGNOSIS — Z95828 Presence of other vascular implants and grafts: Secondary | ICD-10-CM | POA: Diagnosis not present

## 2022-02-27 DIAGNOSIS — R2689 Other abnormalities of gait and mobility: Secondary | ICD-10-CM | POA: Diagnosis present

## 2022-02-27 DIAGNOSIS — I6329 Cerebral infarction due to unspecified occlusion or stenosis of other precerebral arteries: Secondary | ICD-10-CM | POA: Diagnosis present

## 2022-02-27 DIAGNOSIS — Z7984 Long term (current) use of oral hypoglycemic drugs: Secondary | ICD-10-CM | POA: Diagnosis not present

## 2022-02-27 DIAGNOSIS — G8194 Hemiplegia, unspecified affecting left nondominant side: Secondary | ICD-10-CM | POA: Diagnosis present

## 2022-02-27 DIAGNOSIS — Z87891 Personal history of nicotine dependence: Secondary | ICD-10-CM | POA: Diagnosis not present

## 2022-02-27 DIAGNOSIS — N1831 Chronic kidney disease, stage 3a: Secondary | ICD-10-CM | POA: Diagnosis present

## 2022-02-27 DIAGNOSIS — I471 Supraventricular tachycardia, unspecified: Secondary | ICD-10-CM | POA: Diagnosis present

## 2022-02-27 DIAGNOSIS — E1122 Type 2 diabetes mellitus with diabetic chronic kidney disease: Secondary | ICD-10-CM | POA: Diagnosis not present

## 2022-02-27 DIAGNOSIS — I63442 Cerebral infarction due to embolism of left cerebellar artery: Secondary | ICD-10-CM | POA: Diagnosis present

## 2022-02-27 DIAGNOSIS — I69391 Dysphagia following cerebral infarction: Secondary | ICD-10-CM | POA: Diagnosis not present

## 2022-02-27 DIAGNOSIS — Z91040 Latex allergy status: Secondary | ICD-10-CM | POA: Diagnosis not present

## 2022-02-27 DIAGNOSIS — R5381 Other malaise: Secondary | ICD-10-CM | POA: Diagnosis present

## 2022-02-27 DIAGNOSIS — K5901 Slow transit constipation: Secondary | ICD-10-CM | POA: Diagnosis not present

## 2022-02-27 DIAGNOSIS — Z8249 Family history of ischemic heart disease and other diseases of the circulatory system: Secondary | ICD-10-CM | POA: Diagnosis not present

## 2022-02-27 DIAGNOSIS — Z79899 Other long term (current) drug therapy: Secondary | ICD-10-CM | POA: Diagnosis not present

## 2022-02-27 DIAGNOSIS — E1165 Type 2 diabetes mellitus with hyperglycemia: Secondary | ICD-10-CM | POA: Diagnosis not present

## 2022-02-27 DIAGNOSIS — N1832 Chronic kidney disease, stage 3b: Secondary | ICD-10-CM | POA: Diagnosis present

## 2022-02-27 DIAGNOSIS — M25512 Pain in left shoulder: Secondary | ICD-10-CM | POA: Diagnosis not present

## 2022-02-27 DIAGNOSIS — I6389 Other cerebral infarction: Secondary | ICD-10-CM

## 2022-02-27 DIAGNOSIS — I509 Heart failure, unspecified: Secondary | ICD-10-CM | POA: Diagnosis present

## 2022-02-27 DIAGNOSIS — E1169 Type 2 diabetes mellitus with other specified complication: Secondary | ICD-10-CM | POA: Diagnosis not present

## 2022-02-27 DIAGNOSIS — E739 Lactose intolerance, unspecified: Secondary | ICD-10-CM | POA: Diagnosis present

## 2022-02-27 DIAGNOSIS — X58XXXA Exposure to other specified factors, initial encounter: Secondary | ICD-10-CM | POA: Diagnosis present

## 2022-02-27 DIAGNOSIS — Z833 Family history of diabetes mellitus: Secondary | ICD-10-CM | POA: Diagnosis not present

## 2022-02-27 DIAGNOSIS — Z6841 Body Mass Index (BMI) 40.0 and over, adult: Secondary | ICD-10-CM | POA: Diagnosis not present

## 2022-02-27 DIAGNOSIS — I708 Atherosclerosis of other arteries: Secondary | ICD-10-CM | POA: Diagnosis present

## 2022-02-27 DIAGNOSIS — I10 Essential (primary) hypertension: Secondary | ICD-10-CM | POA: Diagnosis not present

## 2022-02-27 DIAGNOSIS — Z823 Family history of stroke: Secondary | ICD-10-CM | POA: Diagnosis not present

## 2022-02-27 DIAGNOSIS — I129 Hypertensive chronic kidney disease with stage 1 through stage 4 chronic kidney disease, or unspecified chronic kidney disease: Secondary | ICD-10-CM | POA: Diagnosis present

## 2022-02-27 DIAGNOSIS — E11319 Type 2 diabetes mellitus with unspecified diabetic retinopathy without macular edema: Secondary | ICD-10-CM | POA: Diagnosis present

## 2022-02-27 DIAGNOSIS — I6350 Cerebral infarction due to unspecified occlusion or stenosis of unspecified cerebral artery: Secondary | ICD-10-CM | POA: Diagnosis not present

## 2022-02-27 DIAGNOSIS — K219 Gastro-esophageal reflux disease without esophagitis: Secondary | ICD-10-CM | POA: Diagnosis present

## 2022-02-27 DIAGNOSIS — N182 Chronic kidney disease, stage 2 (mild): Secondary | ICD-10-CM | POA: Diagnosis not present

## 2022-02-27 DIAGNOSIS — M792 Neuralgia and neuritis, unspecified: Secondary | ICD-10-CM | POA: Diagnosis not present

## 2022-02-27 DIAGNOSIS — T781XXA Other adverse food reactions, not elsewhere classified, initial encounter: Secondary | ICD-10-CM | POA: Diagnosis present

## 2022-02-27 DIAGNOSIS — H02402 Unspecified ptosis of left eyelid: Secondary | ICD-10-CM | POA: Diagnosis present

## 2022-02-27 DIAGNOSIS — E785 Hyperlipidemia, unspecified: Secondary | ICD-10-CM | POA: Diagnosis present

## 2022-02-27 DIAGNOSIS — M545 Low back pain, unspecified: Secondary | ICD-10-CM | POA: Diagnosis present

## 2022-02-27 DIAGNOSIS — E669 Obesity, unspecified: Secondary | ICD-10-CM | POA: Diagnosis not present

## 2022-02-27 LAB — ECHOCARDIOGRAM COMPLETE BUBBLE STUDY
Area-P 1/2: 2.81 cm2
S' Lateral: 2.9 cm

## 2022-02-27 LAB — URINALYSIS, ROUTINE W REFLEX MICROSCOPIC
Bilirubin Urine: NEGATIVE
Glucose, UA: 50 mg/dL — AB
Hgb urine dipstick: NEGATIVE
Ketones, ur: 5 mg/dL — AB
Nitrite: NEGATIVE
Protein, ur: 100 mg/dL — AB
Specific Gravity, Urine: 1.025 (ref 1.005–1.030)
pH: 5 (ref 5.0–8.0)

## 2022-02-27 LAB — LIPID PANEL
Cholesterol: 174 mg/dL (ref 0–200)
HDL: 36 mg/dL — ABNORMAL LOW (ref >40)
LDL Cholesterol: 118 mg/dL — ABNORMAL HIGH (ref 0–99)
Total CHOL/HDL Ratio: 4.8 RATIO
Triglycerides: 98 mg/dL (ref <150)
VLDL: 20 mg/dL (ref 0–40)

## 2022-02-27 LAB — PROTIME-INR
INR: 1 (ref 0.8–1.2)
Prothrombin Time: 13.1 seconds (ref 11.4–15.2)

## 2022-02-27 LAB — APTT: aPTT: 24 seconds (ref 24–36)

## 2022-02-27 LAB — RAPID URINE DRUG SCREEN, HOSP PERFORMED
Amphetamines: NOT DETECTED
Barbiturates: NOT DETECTED
Benzodiazepines: NOT DETECTED
Cocaine: NOT DETECTED
Opiates: NOT DETECTED
Tetrahydrocannabinol: NOT DETECTED

## 2022-02-27 LAB — HIV ANTIBODY (ROUTINE TESTING W REFLEX): HIV Screen 4th Generation wRfx: NONREACTIVE

## 2022-02-27 LAB — TSH: TSH: 0.499 u[IU]/mL (ref 0.350–4.500)

## 2022-02-27 LAB — HEMOGLOBIN A1C
Hgb A1c MFr Bld: 8.7 % — ABNORMAL HIGH (ref 4.8–5.6)
Mean Plasma Glucose: 202.99 mg/dL

## 2022-02-27 LAB — CBG MONITORING, ED
Glucose-Capillary: 208 mg/dL — ABNORMAL HIGH (ref 70–99)
Glucose-Capillary: 214 mg/dL — ABNORMAL HIGH (ref 70–99)
Glucose-Capillary: 155 mg/dL — ABNORMAL HIGH (ref 70–99)
Glucose-Capillary: 148 mg/dL — ABNORMAL HIGH (ref 70–99)

## 2022-02-27 MED ORDER — ASPIRIN 81 MG PO TBEC: 81.0000 mg | DELAYED_RELEASE_TABLET | Freq: Every day | ORAL | Status: DC

## 2022-02-27 MED ORDER — IOHEXOL 350 MG/ML SOLN
60.0000 mL | Freq: Once | INTRAVENOUS | Status: AC | PRN
  Administered 2022-02-27: 60 mL via INTRAVENOUS

## 2022-02-27 MED ORDER — METOCLOPRAMIDE HCL 5 MG/ML IJ SOLN
10.0000 mg | Freq: Once | INTRAMUSCULAR | Status: AC
  Administered 2022-02-27: 10 mg via INTRAVENOUS
  Filled 2022-02-27: qty 2

## 2022-02-27 MED ORDER — FAMOTIDINE IN NACL 20-0.9 MG/50ML-% IV SOLN
20.0000 mg | Freq: Two times a day (BID) | INTRAVENOUS | Status: DC
  Administered 2022-02-27 – 2022-03-05 (×14): 20 mg via INTRAVENOUS
  Filled 2022-02-27 (×16): qty 50

## 2022-02-27 MED ORDER — ACETAMINOPHEN 325 MG PO TABS: 650.0000 mg | ORAL_TABLET | ORAL | Status: DC | PRN

## 2022-02-27 MED ORDER — ONDANSETRON HCL 4 MG/2ML IJ SOLN: 4.0000 mg | Freq: Four times a day (QID) | INTRAMUSCULAR | Status: DC | PRN

## 2022-02-27 MED ORDER — ENOXAPARIN SODIUM 60 MG/0.6ML IJ SOSY
50.0000 mg | PREFILLED_SYRINGE | INTRAMUSCULAR | Status: DC
  Administered 2022-02-27 – 2022-03-03 (×5): 50 mg via SUBCUTANEOUS
  Filled 2022-02-27 (×5): qty 0.6

## 2022-02-27 MED ORDER — ACETAMINOPHEN 160 MG/5ML PO SOLN
650.0000 mg | ORAL | Status: DC | PRN
  Administered 2022-03-05: 650 mg
  Filled 2022-02-27: qty 20.3

## 2022-02-27 MED ORDER — ACETAMINOPHEN 650 MG RE SUPP: 650.0000 mg | RECTAL | Status: DC | PRN

## 2022-02-27 MED ORDER — INSULIN ASPART 100 UNIT/ML IJ SOLN
0.0000 [IU] | Freq: Every day | INTRAMUSCULAR | Status: DC
  Administered 2022-02-27 – 2022-03-06 (×2): 2 [IU] via SUBCUTANEOUS

## 2022-02-27 MED ORDER — DIPHENHYDRAMINE HCL 50 MG/ML IJ SOLN
25.0000 mg | Freq: Four times a day (QID) | INTRAMUSCULAR | Status: DC | PRN
  Administered 2022-02-27 – 2022-03-06 (×21): 25 mg via INTRAVENOUS
  Filled 2022-02-27 (×22): qty 1

## 2022-02-27 MED ORDER — SENNOSIDES-DOCUSATE SODIUM 8.6-50 MG PO TABS: 1.0000 | ORAL_TABLET | Freq: Every evening | ORAL | Status: DC | PRN

## 2022-02-27 MED ORDER — STROKE: EARLY STAGES OF RECOVERY BOOK
Freq: Once | Status: AC
  Filled 2022-02-27: qty 1

## 2022-02-27 MED ORDER — LACTATED RINGERS IV BOLUS
1000.0000 mL | Freq: Once | INTRAVENOUS | Status: AC
  Administered 2022-02-27: 1000 mL via INTRAVENOUS

## 2022-02-27 MED ORDER — ATORVASTATIN CALCIUM 80 MG PO TABS
80.0000 mg | ORAL_TABLET | Freq: Every day | ORAL | Status: DC
  Filled 2022-02-27: qty 1

## 2022-02-27 MED ORDER — CLOPIDOGREL BISULFATE 75 MG PO TABS
75.0000 mg | ORAL_TABLET | Freq: Every day | ORAL | Status: DC
  Filled 2022-02-27: qty 1

## 2022-02-27 MED ORDER — SODIUM CHLORIDE 0.9 % IV SOLN
INTRAVENOUS | Status: DC
  Administered 2022-02-27: 1000 mL via INTRAVENOUS

## 2022-02-27 MED ORDER — INSULIN ASPART 100 UNIT/ML IJ SOLN
0.0000 [IU] | Freq: Three times a day (TID) | INTRAMUSCULAR | Status: DC
  Administered 2022-02-27: 5 [IU] via SUBCUTANEOUS
  Administered 2022-02-27: 3 [IU] via SUBCUTANEOUS
  Administered 2022-02-28 (×2): 2 [IU] via SUBCUTANEOUS
  Administered 2022-03-01: 3 [IU] via SUBCUTANEOUS
  Administered 2022-03-02: 2 [IU] via SUBCUTANEOUS
  Administered 2022-03-03: 3 [IU] via SUBCUTANEOUS
  Administered 2022-03-04 (×2): 2 [IU] via SUBCUTANEOUS
  Administered 2022-03-04 – 2022-03-05 (×2): 3 [IU] via SUBCUTANEOUS
  Administered 2022-03-05 (×2): 5 [IU] via SUBCUTANEOUS
  Administered 2022-03-06 (×2): 8 [IU] via SUBCUTANEOUS
  Administered 2022-03-06: 5 [IU] via SUBCUTANEOUS
  Administered 2022-03-07: 11 [IU] via SUBCUTANEOUS
  Administered 2022-03-07: 5 [IU] via SUBCUTANEOUS

## 2022-02-27 NOTE — H&P (Addendum)
History and Physical    Patient: Michelle Brooks EXB:[id number] DOB: [birth year] DOA: 02/26/2022 DOS: the patient was seen and examined on 02/27/2022 PCP: Nolene Ebbs, MD  Patient coming from: Home - lives with partner, son; NOK: Jetty Peeks, [phone]; Daughter, Marthenia Rolling, [phone]   Chief Complaint: Emesis  HPI: Michelle Brooks is a 57 y.o. female with medical history significant of chronic back pain, DM, HTN, prior CVA, and HLD presenting with emesis.  She has a h/o prior CVAs and these have historically presented with n/v.  She ate a lot of shrimp (no known allergy) and awoke yesterday AM with n/v.  She also noticed some dysphagia, dysarthria, tongue swelling, and eventual left-sided weakness (facial droop as well as LUE/LLE).   Her symptoms have progressed some while in the ER.  The patient appears to be more concerned about possible allergic reaction as the cause for her difficulty swallowing.    ER Course:  Carryover, per Dr. Marlowe Sax:  57 year old female with history of prior stroke, diabetes, hypertension, hyperlipidemia presenting with nausea, vomiting, abdominal pain, difficulty swallowing, dysarthria, right upper extremity weakness, and left eyelid drooping.  Labs reassuring (no leukocytosis, no elevation of lipase or LFTs).  MRI showing small acute infarcts in the left lateral medulla, right pontine medullary junction, and left cerebellum.  Onset of stroke symptoms was 36 hours ago.  Blood pressure initially above 200 and was given hydralazine, now in the 170s.  ED physician has consulted neurology (Dr. Lorrin Goodell).      Review of Systems: As mentioned in the history of present illness. All other systems reviewed and are negative. Past Medical History:  Diagnosis Date   Chronic back pain    Diabetes mellitus type 2 in obese (HCC)    Diabetic retinopathy ([place])    GERD (gastroesophageal reflux disease)    Headache    History of stroke 03/2010   Right MCA  stroke in 03/2010, with MRI also noting subacute strokes in left fronto parietal area - occured in [place] received care at [facility] thought due to uncontrolled blood pressure // No residual deficits // TTE (03/2010) at [facility] - normal LV systolic function, moderate pericardial effusion with 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   HSV-2 infection    Hyperlipidemia LDL goal < 100 04/01/2011   Hypertension    Lumbago    pain in the back   Past Surgical History:  Procedure Laterality Date   BREAST LUMPECTOMY  2002   states benign lymph node removed.   BUBBLE STUDY  08/30/2018   Procedure: BUBBLE STUDY;  Surgeon: Thayer Headings, MD;  Location: [place];  Service: Cardiovascular;;   ENDOMETRIAL BIOPSY  10/04/2020   LOOP RECORDER INSERTION N/A 08/30/2018   Procedure: LOOP RECORDER INSERTION;  Surgeon: Evans Lance, MD;  Location: [place] [facility];  Service: Cardiovascular;  Laterality: N/A;   TEE WITHOUT CARDIOVERSION N/A 08/30/2018   Procedure: TRANSESOPHAGEAL ECHOCARDIOGRAM (TEE);  Surgeon: Acie Fredrickson Wonda Cheng, MD;  Location: [facility];  Service: Cardiovascular;  Laterality: N/A;   Social History:  reports that she quit smoking about 17 years ago. Her smoking use included cigarettes. She has a 1.40 pack-year smoking history. She has never used smokeless tobacco. She reports that she does not drink alcohol and does not use drugs.  Allergies  Allergen Reactions   Lactose Intolerance (Gi) Diarrhea and Nausea And Vomiting   Latex     Like a burning  in area    Family History  Problem Relation Age of Onset   Diabetes Mother    Stroke Mother 83   Diabetes Brother    Diabetes Brother    Cancer Brother 36       unknown type   Hypertension Brother     Prior to Admission medications   Medication Sig Start Date End Date Taking? Authorizing Provider  aspirin EC 81 MG tablet Take 81 mg by mouth daily.   Yes [provider]  Dulaglutide 3 MG/0.5ML SOPN Inject 3 mg into the skin once a week. Sunday 03/12/21  Yes [provider]  insulin lispro protamine-lispro (HUMALOG 50/50) (50-50) 100 UNIT/ML SUSP injection Inject 80 Units into the skin 3 (three) times daily. Up to 60 depending on cbg   Yes [provider]  JANUVIA 100 MG tablet Take 100 mg by mouth daily. 07/02/18  Yes [provider]  pravastatin (PRAVACHOL) 80 MG tablet Take 1 tablet (80 mg total) by mouth daily. 11/01/20  Yes McCue, Jessica, NP  valsartan (DIOVAN) 160 MG tablet Take 160 mg by mouth daily. 03/12/21  Yes [provider]  Blood Glucose Monitoring Suppl (ACCU-CHEK AVIVA PLUS) W/DEVICE KIT 1 kit by Does not apply route 3 (three) times daily before meals. 06/24/12   Kalia-Reynolds, Maitri S, DO  Blood Glucose Monitoring Suppl (TRUE METRIX METER) DEVI 1 each by Does not apply route 4 (four) times daily -  before meals and at bedtime. 09/15/14   Newlin, Enobong, MD  clobetasol (OLUX) 0.05 % topical foam Apply topically 2 (two) times daily. Patient not taking: Reported on 02/27/2022 08/07/21   Tafeen, Stuart, MD  glucose blood (TRUE METRIX BLOOD GLUCOSE TEST) test strip Use as instructed 09/15/14   Newlin, Enobong, MD  TRUEPLUS LANCETS 28G MISC 1 each by Does not apply route 4 (four) times daily -  before meals and at bedtime. 09/18/14   Newlin, Enobong, MD    Physical Exam: Vitals:   02/27/22 0600 02/27/22 0749 02/27/22 1027 02/27/22 1248  BP: (!) 177/77 (!) 174/76 (!) 184/86 (!) 186/87  Pulse: 72 76 82 83  Resp: 18 16 16 20  Temp:  97.9 F (36.6 C)  98.6 F (37 C)  TempSrc:  Oral  Oral  SpO2: 99% 96% 97% 93%  Weight:      Height:       General:  Appears calm and comfortable and is in NAD Eyes:  EOMI, normal lids, iris ENT:  grossly normal hearing, lips; large tongue with some mouth fullness but airway appears to be intact, mmm Neck:  no LAD, masses or thyromegaly, ?submandibular edema (could be habitus but  daughter is concerned that this is new/worse than usual) Cardiovascular:  RRR, no m/r/g. No LE edema.  Respiratory:   CTA bilaterally with no wheezes/rales/rhonchi.  Normal respiratory effort. Abdomen:  soft, NT, ND Skin:  no rash or induration seen on limited exam Musculoskeletal:  LUE/LLE weakness, 4/5 Psychiatric:  blunted/somnolent mood and affect, speech dysarthric but appropriate, AOx3 Neurologic:  L facial droop, mild LUE/LLE weakness, sensation intact   Radiological Exams on Admission: Independently reviewed - see discussion in A/P where applicable  ECHOCARDIOGRAM COMPLETE BUBBLE STUDY  Result Date: 02/27/2022    ECHOCARDIOGRAM REPORT   Patient Name:   Maggi D Deasis Date of Exam: 02/27/2022 Medical Rec #:  [id number]      Height:       68 .0 in Accession #:     Weight:       250.0 lb Date of Birth:  Jun 19, 1965       BSA:          2.247 m Patient Age:    27 years       BP:           184/86 mmHg Patient Gender: F              HR:           80 bpm. Exam Location:  Inpatient Procedure: 2D Echo, Color Doppler, Cardiac Doppler and Saline Contrast Bubble            Study Indications:    Stroke i63.9;  History:        Patient has prior history of Echocardiogram examinations, most                 recent 08/30/2018. Risk Factors:Hypertension, Diabetes and                 Dyslipidemia.  Sonographer:    Raquel Sarna Senior RDCS Referring Phys: [id number] [place]  1. Left ventricular ejection fraction, by estimation, is 60 to 65%. The left ventricle has normal function. The left ventricle has no regional wall motion abnormalities. There is mild concentric left ventricular hypertrophy. Left ventricular diastolic parameters are consistent with Grade I diastolic dysfunction (impaired relaxation).  2. Right ventricular systolic function is normal. The right ventricular size is normal. Tricuspid regurgitation signal is inadequate for assessing PA pressure.  3. No evidence of mitral valve  regurgitation.  4. The aortic valve is tricuspid. Aortic valve regurgitation is not visualized.  5. The inferior vena cava is normal in size with greater than 50% respiratory variability, suggesting right atrial pressure of 3 mmHg.  6. Agitated saline contrast bubble study was negative, with no evidence of any interatrial shunt. Comparison(s): No significant change from prior study. Conclusion(s)/Recommendation(s): Prior TEE in 2020 showed possible very small PFO or AVM. No significant PFO seen on TTE. With recurrent stroke, recommend repeat TEE with more bicaval views (3D imaging) to determine if has PFO amenable for closure. Can also r/o veg related emboli. ILR did not show afib. FINDINGS  Left Ventricle: Left ventricular ejection fraction, by estimation, is 60 to 65%. The left ventricle has normal function. The left ventricle has no regional wall motion abnormalities. The left ventricular internal cavity size was normal in size. There is  mild concentric left ventricular hypertrophy. Left ventricular diastolic parameters are consistent with Grade I diastolic dysfunction (impaired relaxation). Right Ventricle: The right ventricular size is normal. Right ventricular systolic function is normal. Tricuspid regurgitation signal is inadequate for assessing PA pressure. Left Atrium: Left atrial size was normal in size. Right Atrium: Right atrial size was normal in size. Pericardium: There is no evidence of pericardial effusion. Mitral Valve: No evidence of mitral valve regurgitation. Tricuspid Valve: Tricuspid valve regurgitation is not demonstrated. Aortic Valve: The aortic valve is tricuspid. Aortic valve regurgitation is not visualized. Pulmonic Valve: Pulmonic valve regurgitation is not visualized. Aorta: The aortic root and ascending aorta are structurally normal, with no evidence of dilitation. Venous: The inferior vena cava is normal in size with greater than 50% respiratory variability, suggesting right atrial  pressure of 3 mmHg. IAS/Shunts: Agitated saline contrast was given intravenously to evaluate for intracardiac shunting. Agitated saline contrast bubble study was negative, with no evidence of any interatrial shunt.  LEFT VENTRICLE PLAX 2D LVIDd:  4.10 cm   Diastology LVIDs:         2.90 cm   LV e' medial:    6.64 cm/s LV PW:         1.70 cm   LV E/e' medial:  12.1 LV IVS:        1.10 cm   LV e' lateral:   8.38 cm/s LVOT diam:     2.20 cm   LV E/e' lateral: 9.6 LV SV:         68 LV SV Index:   30 LVOT Area:     3.80 cm  RIGHT VENTRICLE RV S prime:     14.80 cm/s TAPSE (M-mode): 2.2 cm LEFT ATRIUM             Index        RIGHT ATRIUM           Index LA diam:        3.30 cm 1.47 cm/m   RA Area:     13.90 cm LA Vol (A2C):   46.1 ml 20.52 ml/m  RA Volume:   30.90 ml  13.75 ml/m LA Vol (A4C):   52.2 ml 23.23 ml/m LA Biplane Vol: 49.7 ml 22.12 ml/m  AORTIC VALVE LVOT Vmax:   93.30 cm/s LVOT Vmean:  57.100 cm/s LVOT VTI:    0.178 m  AORTA Ao Root diam: 3.50 cm Ao Asc diam:  3.40 cm MITRAL VALVE MV Area (PHT): 2.81 cm     SHUNTS MV Decel Time: 270 msec     Systemic VTI:  0.18 m MV E velocity: 80.10 cm/s   Systemic Diam: 2.20 cm MV A velocity: 109.00 cm/s MV E/A ratio:  0.73 Mary Branch Electronically signed by Mary Branch Signature Date/Time: 02/27/2022/11:44:43 AM    Final    CT ANGIO HEAD NECK W WO CM  Result Date: 02/27/2022 CLINICAL DATA:  Neuro deficit, acute, stroke suspected EXAM: CT ANGIOGRAPHY HEAD AND NECK TECHNIQUE: Multidetector CT imaging of the head and neck was performed using the standard protocol during bolus administration of intravenous contrast. Multiplanar CT image reconstructions and MIPs were obtained to evaluate the vascular anatomy. Carotid stenosis measurements (when applicable) are obtained utilizing NASCET criteria, using the distal internal carotid diameter as the denominator. RADIATION DOSE REDUCTION: This exam was performed according to the departmental dose-optimization  program which includes automated exposure control, adjustment of the mA and/or kV according to patient size and/or use of iterative reconstruction technique. CONTRAST:  21SharBridget HartDupont Surgery Cente48 Su<[phone] Ruthe6Low41mSharBridget HartBlanchard Valley Hospita84[address]<[phone] Ruthe10Low51mSharBridget HartBrook Lane Health Service8[address]<[phone] Ruthe4Low28mSharBridget HartSurgery Center Of Bone And Joint Institut7561 C<MEASUREMENTR8054055Ernesto Ruthe8Low54mSharBridget HartEating Recovery Center Behavioral Healt245 Ly<[phone] RutheLow13mSharBridget HartHealthsouth Rehabilitation Hospital Of Forth Wort90[address]<MEASUREMENTR7263263Ernesto Ruthe1Low8mSharBridget HartFort Duncan Regional Medical Cente7<[phone] Ruthe6Low5mSharBridget HartNorthern Hospital Of Surry Count834 Unive<[phone] Ruthe7Low62mSharBridget HartSan Antonio Gastroenterology Endoscopy Center Nort68 Bea<[phone] Ruthe3Low44mSharBridget HartHuggins Hospita24 Eu<MEASUREMENTR225 088 3Ernesto Ruthe8Low74mSharBridget HartCarilion Roanoke Community Hospita8095 De<[phone] Ruthe5Low31mSharBridget HartPlano Specialty Hospita37 Cor<[phone] Ruthe6Low64mSharBridget HartThe Orthopaedic Surgery Center LL[address] <[phone] Ruthe6Low81mSharBridget HartHudson County Meadowview Psychiatric [facility]ospita[address]<[phone] Ruthe4Low3mSharBridget HartCommonwealth Health Cente[address]<[phone] Ruthe4Low79mSharBridget HartBel Air Ambulatory Surgical Center LL430 C<[phone] Ruthe1Low27mSharBridget HartKindred Hospital - Chicag92 Omro<[phone] Ruthe2Low32mSharBridget HartEssentia Health St Josephs Me[address]<[id number] Ruthe5Low71mSharBridget HartGrace Medical Cente8[address]<MEASUREMENTR859 370 9Ernesto Ruthe8Low51mSharBridget HartSlidell Memorial Hospita[address]<[phone] Ruthe6Low84mSharBridget HartUchealth Highlands Ranch Hospita[address] L<[phone] Ruthe3Low64mSharBridget HartJonesboro Surgery Center LL8108 [address]wo<[phone] Ruthe6Low51mSharBridget HartGeorge Washington University Hospita207 [address]<[phone] Ruthe7Low5mSharBridget HartSamuel Mahelona Memorial Hospita[address]<[phone] Ruthe8Low43mSharBridget HartPalo Alto Va Medical Cente106 He<[phone] Ruthe5Low20mSharBridget HartEl Paso Behavioral Health Syste9[address]<MEASUREMENTR7097521Ernesto Ruthe6Low52mSharBridget HartOrthopedic Healthcare Ancillary Services LLC Dba [facility] Cente78 Argy<[phone] Ruthe3Low27mSharBridget HartHebrew Rehabilitation Center At Dedha99 Squ[address] Cre<[phone] Ruthe5Low33mSharBridget HartWyckoff Heights Medical Cente7762 Fa<[phone] Ruthe7Low20mSharBridget HartHaywood Regional Medical Cente9140 [place]<[phone] RutheLow37mSharBridget HartTelecare Santa Cruz Ph13 [address]yl<[phone] Ruthe6Low39mSharBridget HartTalbert Surgical Associate[address]<[phone] Ruthe6Low32mSharBridget HartTexas General Hospital - Van Zand[facility] Regional [facility] Cente98 Se<[phone] Ruthe3Low63mSharBridget HartVa Salt Lake City Healthcare - George E. [facility] Cente80 San <[id number] Ruthe2Lowanda Fosterra IOHEXOL 350 MG/ML SOLN COMPARISON:  MRI head from the same day. CTA head/neck from 08/28/2018. FINDINGS: CTA NECK FINDINGS Aortic arch: Great vessel origins are patent without significant stenosis. Right carotid system: No evidence of dissection, stenosis (50% or greater), or occlusion. Mild atherosclerosis. Left carotid system: No evidence of dissection, stenosis (50% or greater), or occlusion. Mild atherosclerosis. Vertebral arteries: Evaluation of the proximal vertebral arteries limited by streak artifact. Non opacification of the left vertebral artery proximally and poor opacification of the right vertebral artery, suspicious for interval proximal occlusion and/or stenosis. Distal reconstitution bilaterally. Skeleton: Multilevel degenerative change. Other neck: Thyroid goiter with multiple nodules, as previously characterized. Upper chest: The visualized lung apices demonstrate areas of emphysema and fibrosis/scarring. 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 Moderate right cavernous/paraclinoid ICA stenosis. Bilaterally there is occlusive or near occlusive stenosis of the M1 MCAs, likely mildly progressed from the prior.  There are collaterals bilaterally. The left A1 ACA is diminutive. The right A1 ACA more distal ACAs are patent. Posterior circulation: Small vertebrobasilar system. The left intradural vertebral artery is stenotic but remains patent. The right intradural vertebral artery terminates as PICA, anatomic variant. Irregular opacification of the basilar artery, similar. The posterior communicating arteries largely supply the posterior cerebral areas which remain patent. Venous sinuses:  As permitted by contrast timing, patent. Anatomic variants: Detailed above. Review of the MIP images confirms the above findings IMPRESSION: 1. In comparison to 2020 CTA, some progression of occlusive intracranial disease, suggestive of moyamoya type process and described above. 2. Evaluation of the proximal vertebral arteries limited by streak artifact with apparent non-opacification of the left vertebral artery proximally and poor opacification right vertebral artery, suspicious for interval proximal occlusion and/or stenosis. Distal reconstitution bilaterally. Intracranially, posterior cerebral arteries are largely supplied by posterior communicating arteries. Electronically Signed   By: Feliberto Harts M.D.   On: 02/27/2022 08:34   MR BRAIN WO CONTRAST  Result Date: 02/27/2022 CLINICAL DATA:  Stroke follow-up, evaluate new hypodensity by CT. EXAM: MRI HEAD WITHOUT CONTRAST TECHNIQUE: Multiplanar, multiecho pulse sequences of the brain and surrounding structures were obtained without intravenous contrast. COMPARISON:  11/19/2020 FINDINGS: Brain: Small acute infarcts seen in the low right pontine medullary junction, left lateral medulla, and clustered in the left cerebellum. Pre-existing right cerebellar infarction. Small cortical infarcts have occurred along the cerebral convexities, greatest at the high right frontal and left parietal cortex. Brain volume is normal. No acute hemorrhage, hydrocephalus, or masslike finding Vascular: Grossly preserved flow voids when compared to prior. Skull and upper cervical spine: Normal marrow signal. Sinuses/Orbits: Negative. IMPRESSION: 1. Small acute infarcts in the left lateral medulla, right pontine medullary junction, and left cerebellum. 2. Remote infarcts in the right cerebellum and bilateral cerebral cortex. Electronically Signed   By: Tiburcio Pea M.D.   On: 02/27/2022 05:18   CT Head Wo Contrast  Result Date: 02/26/2022 CLINICAL DATA:  Transient ischemic  attack. Nausea and vomiting. History of strokes. EXAM: CT HEAD WITHOUT CONTRAST TECHNIQUE: Contiguous axial images were obtained from the base of the skull through the vertex without intravenous contrast. RADIATION DOSE REDUCTION: This exam was performed according to the departmental dose-optimization program which includes automated exposure control, adjustment of the mA and/or kV according to patient size and/or use of iterative reconstruction technique. COMPARISON:  11/19/2020. FINDINGS: Brain: No acute intracranial hemorrhage, midline shift or mass effect. No extra-axial fluid collection. Encephalomalacia is present in the frontal lobe on the right, unchanged. There is a old infarct in the right cerebellar hemisphere. Scattered periventricular white matter hypodensities are present bilaterally. No hydrocephalus. A vague hypodense region is noted in the frontal lobe on the left which is new from the prior exam. Vascular: No hyperdense vessel or unexpected calcification. Skull: Normal. Negative for fracture or focal lesion. Sinuses/Orbits: A round density is present in the right maxillary sinus, possible mucosal retention cyst or polyp. No acute orbital abnormality. Other: None. IMPRESSION: 1. Vague hypodensity in the frontal lobe on the left which is new from the previous exam. MRI is suggested for further evaluation. 2. Chronic microvascular ischemic changes with old infarcts in the right cerebellar hemisphere and right frontal lobe. Electronically Signed   By: Thornell Sartorius M.D.   On: 02/26/2022 21:31    EKG: Independently reviewed.  NSR with rate 88; nonspecific ST changes with no evidence of acute ischemia   Labs on Admission: I have personally reviewed  the available labs and imaging studies at the time of the admission.  Pertinent labs:    CO2 19 Glucose 164 BUN 14/Creatinine 1.09/GFR 60 Unremarkable CBC INR 1 UA: 50 glucose, 5 ketones, moderate LE, 100 protein, rare bacteria UDS  negative   Assessment and Plan: Principal Problem:   Acute CVA (cerebrovascular accident) (HCC) Active Problems:   Type 2 diabetes mellitus with hyperlipidemia (HCC)   Hypertension   Dyslipidemia   Obesity   Vomiting in adult   Allergic reaction to food    CVA -Patient presenting with multiple symptoms but her primary complaint was n/v; according to her daughter, this is how previous strokes presented -Also with L facial droop and L hemiparesis -Concerning for CVA -tPA cannot be given because of duration of symptoms -Aspirin has been given to reduce stroke mortality and decrease morbidity -Will place in observation status for CVA evaluation -Telemetry monitoring -MRI confirms multiple embolic CVAs -Echo ordered -Risk stratification with FLP, A1c; will also check TSH and UDS -Patient will need DAPT for 21 days.  Will defer to neurology for now. -Neurology consult -PT/OT/ST/Nutrition Consults -She is quite young for recurrent strokes; suggest hypercoagulability work-up if not already done  Emesis -Similar presentation in 08/2018 -> acute ischemic stroke -Likely related to CVA, although allergic reaction may also be contributing (see below) -Will give Zofran prn  ?Allergic reaction -Patient with apparent angioedema that is most likely associated with shrimp intake, per her daughter; however, she ate this food 2 days ago and it would be unusual to still have an acute reaction remotely) -She does not appear to have a new rash -She does demonstrate some tongue swelling and submandibular edema without obvious airway compromise at this time -I have ordered Benadryl and Pepcid for now; consider Epipen and Solumedrol if worsening (she reports that shrimp was eaten 2 days ago so doesn't seem necessary at this time).  HTN -Allow permissive HTN for now -Treat BP only if >220/120, and then with goal of 15% reduction -Hold ARB and plan to restart in 48-72 hours -Med rec indicates  ?compliance with valsartan   HLD -Check FLP -Resume statin but will change Pravachol to Lipitor 80 mg daily   DM -A1c is 8.7, poor control -Hold home medications (Januvia and also 50/50 insulin) -Would start glargine separate from SSI (currently on 50/50 with sliding scale) -Will order moderate-scale SSI for now -DM coordinator consulted   Obesity -Body mass index is 38.01 kg/m..  -Weight loss should be encouraged -Outpatient PCP/bariatric medicine f/u encouraged  -Taking dulaglutide at home    Advance Care Planning:   Code Status: Full Code - Code status was discussed with the patient and/or family at the time of admission.  The patient would want to receive full resuscitative measures at this time.   Consults: Neurology; PT/OT/ST; nutrition; TOC team  DVT Prophylaxis: Lovenox  Family Communication: Daughter was present throughout evaluation  Severity of Illness: Admit - It is my clinical opinion that admission to INPATIENT is reasonable and necessary because of the expectation that this patient will require hospital care that crosses at least 2 midnights to treat this condition based on the medical complexity of the problems presented.  Given the aforementioned information, the predictability of an adverse outcome is felt to be significant.   Author: Jonah Blue, MD 02/27/2022 1:31 PM  For on call review [email or web address].

## 2022-02-27 NOTE — Progress Notes (Signed)
Opened in error

## 2022-02-27 NOTE — ED Notes (Signed)
Patient transported to CT 

## 2022-02-27 NOTE — Progress Notes (Signed)
SLP Cancellation Note  Patient Details Name: Michelle Brooks MRN: [id number] DOB: 1965-02-24   Cancelled treatment:       Reason Eval/Treat Not Completed: Patient at procedure or test/unavailable   Pat Jamin Humphries,M.S., CCC-SLP 02/27/2022, 9:55 AM

## 2022-02-27 NOTE — ED Notes (Addendum)
I notifid Dr Lorin Mercy of pt elevated bp and she stated they have specific parameters for pt's bp that they will consider elevated, BP goal <220/120

## 2022-02-27 NOTE — Consult Note (Signed)
NEUROLOGY CONSULTATION NOTE   Date of service: February 27, 2022 Patient Name: Michelle Brooks MRN:  [id number] DOB:  Apr 25, 1965 Reason for consult: "nausea/vomiting, left eyelid droop, difficulty swallowing and R sided weakness" Requesting Provider: Magnus Ivan, MD _ _ _   _ __   _ __ _ _  __ __   _ __   __ _  History of Present Illness  Michelle Brooks is a 57 y.o. female with PMH significant for DM2 with retinopathy, HTN, HLD, GERD, who presents with nausea/vomiting, difficulty swallowing, left eyelid droop, numbness on the right and paresthesias since tuesday evening.  She had further workup with MRI Brain which demonstrated left medullary, right pontine and left cerebellar stroke.  Patient reports that she felt funny on Tuesday but is unable to describe that well.  Tuesday evening, she developed nausea vomiting and she feels like her tongue is swollen and she has trouble swallowing water in her saliva.  Family noted that her right face appeared droopy and patient reports problems with her walking and feeling very off balance.  She was brought into the ED for concerns for persistent nausea and vomiting and unable to tolerate anything p.o.  Patient endorses history of diabetes.  Reports that her last hemoglobin A1c was in the eights.  Endorses history of hypertension but does not monitor her blood pressure at home as closely.  She is unsure if she has a history of hyperlipidemia.  LKW: 02/25/2022. mRS: 1 tNKASE: Not offered, patient is outside the window. Thrombectomy: Not offered, patient is outside the window. NIHSS components Score: Comment  1a Level of Conscious 0[x]  1[]  2[]  3[]      1b LOC Questions 0[x]  1[]  2[]       1c LOC Commands 0[x]  1[]  2[]       2 Best Gaze 0[x]  1[]  2[]       3 Visual 0[x]  1[]  2[]  3[]      4 Facial Palsy 0[]  1[]  2[x]  3[]      5a Motor Arm - left 0[x]  1[]  2[]  3[]  4[]  UN[]    5b Motor Arm - Right 0[x]  1[]  2[]  3[]  4[]  UN[]    6a Motor Leg - Left 0[]  1[x]  2[]   3[]  4[]  UN[]    6b Motor Leg - Right 0[x]  1[]  2[]  3[]  4[]  UN[]    7 Limb Ataxia 0[]  1[]  2[x]  3[]  UN[]     8 Sensory 0[]  1[x]  2[]  UN[]      9 Best Language 0[x]  1[]  2[]  3[]      10 Dysarthria 0[]  1[x]  2[]  UN[]      11 Extinct. and Inattention 0[x]  1[]  2[]       TOTAL: 7      ROS   Constitutional Denies weight loss, fever and chills.   HEENT Denies changes in vision and hearing.   Respiratory Denies SOB and cough.   CV Denies palpitations and CP   GI + abdominal pain, nausea, vomiting but no diarrhea.   GU Denies dysuria and urinary frequency.   MSK Denies myalgia and joint pain.   Skin Denies rash and pruritus.   Neurological Denies headache and syncope.   Psychiatric Denies recent changes in mood. Denies anxiety and depression.    Past History   Past Medical History:  Diagnosis Date   Chronic back pain    Diabetes mellitus type 2 in obese [facility])    Diabetic retinopathy (HCC)    GERD (gastroesophageal reflux disease)    Headache    History of stroke 03/2010   Right MCA stroke in 03/2010,  with MRI also noting subacute strokes in left fronto parietal area - occured in [place] received care at [facility] thought due to uncontrolled blood pressure // No residual deficits // TTE (03/2010) at [facility] - normal LV systolic function, moderate pericardial effusion with 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   HSV-2 infection    Hyperlipidemia LDL goal < 100 04/01/2011   Hypertension    Lumbago    pain in the back   Past Surgical History:  Procedure Laterality Date   BREAST LUMPECTOMY  2002   states benign lymph node removed.   BUBBLE STUDY  08/30/2018   Procedure: BUBBLE STUDY;  Surgeon: Thayer Headings, MD;  Location: [place];  Service: Cardiovascular;;   ENDOMETRIAL BIOPSY  10/04/2020   LOOP RECORDER INSERTION N/A 08/30/2018   Procedure: LOOP RECORDER INSERTION;  Surgeon: Evans Lance, MD;  Location: [place] CV LAB;   Service: Cardiovascular;  Laterality: N/A;   TEE WITHOUT CARDIOVERSION N/A 08/30/2018   Procedure: TRANSESOPHAGEAL ECHOCARDIOGRAM (TEE);  Surgeon: Acie Fredrickson, Wonda Cheng, MD;  Location: [facility] ENDOSCOPY;  Service: Cardiovascular;  Laterality: N/A;   Family History  Problem Relation Age of Onset   Diabetes Mother    Stroke Mother 65   Diabetes Brother    Diabetes Brother    Cancer Brother 22       unknown type   Hypertension Brother    Social History   Socioeconomic History   Marital status: Divorced    Spouse name: Not on file   Number of children: 3   Years of education: 12th grade   Highest education level: Not on file  Occupational History   Occupation: unemployed    Comment: previously worked in [place] before her stroke  Tobacco Use   Smoking status: Former    Packs/day: 0.20    Years: 7.00    Total pack years: 1.40    Types: Cigarettes    Quit date: 03/26/2004    Years since quitting: 17.9   Smokeless tobacco: Never  Vaping Use   Vaping Use: Never used  Substance and Sexual Activity   Alcohol use: No   Drug use: No   Sexual activity: Yes    Partners: Male    Birth control/protection: Condom  Other Topics Concern   Not on file  Social History Narrative   Recently moved from [place] in 10/2010 due to wanting a change from stressors.   Previously followed by Dr. Wynelle Bourgeois at [place] Strain: Not on file  Food Insecurity: Not on file  Transportation Needs: Not on file  Physical Activity: Not on file  Stress: Not on file  Social Connections: Not on file   Allergies  Allergen Reactions   Lactose Intolerance (Gi) Diarrhea and Nausea And Vomiting   Latex     Like a burning in area    Medications  (Not in a hospital admission)    Vitals   Vitals:   02/26/22 1157 02/26/22 1643 02/27/22 0300 02/27/22 0600  BP: (!) 209/104 (!) 175/104 (!) 195/89 (!) 177/77  Pulse: 68 81 90 72  Resp: 16 17 16 18   Temp: 98.5 F  (36.9 C)  98.5 F (36.9 C)   TempSrc: Oral  Oral   SpO2: 100% 99% 96% 99%  Weight: 113.4 kg     Height: 5\' 8"  (1.727 m)  Body mass index is 38.01 kg/m.  Physical Exam   General: Laying comfortably in bed; in no acute distress.  HENT: Normal oropharynx and mucosa. Normal external appearance of ears and nose.  Neck: Supple, no pain or tenderness  CV: No JVD. No peripheral edema.  Pulmonary: Symmetric Chest rise. Normal respiratory effort.  Abdomen: Soft to touch, non-tender.  Ext: No cyanosis, edema, or deformity  Skin: No rash. Normal palpation of skin.   Musculoskeletal: Normal digits and nails by inspection. No clubbing.   Neurologic Examination  Mental status/Cognition: Appears somnolent, oriented to self, place, month and year, good attention.  Speech/language: Dysarthric speech, nonfluent, comprehension intact, object naming intact, repetition intact.  Cranial nerves:   CN II Pupils equal and reactive to light, no VF deficits    CN III,IV,VI No gaze preference or deviation, disconjugate gaze with concern for left cranial nerve III mild palsy noted on exam.  She has nystagmus on the left and primary gaze and with horizontal gaze.   CN V normal sensation in V1, V2, and V3 segments bilaterally   CN VII Left upper and lower facial droop with left lid ptosis.   CN VIII normal hearing to speech    CN IX & X normal palatal elevation, no uvular deviation    CN XI 5/5 head turn and 5/5 shoulder shrug bilaterally    CN XII midline tongue protrusion    Motor:  Muscle bulk: Normal, tone normal, pronator drift noted in left upper extremity.   Mvmt Root Nerve  Muscle Right Left Comments  SA C5/6 Ax Deltoid 5 5   EF C5/6 Mc Biceps 5 5   EE C6/7/8 Rad Triceps 5 5   WF C6/7 Med FCR     WE C7/8 PIN ECU     F Ab C8/T1 U ADM/FDI 5 5   HF L1/2/3 Fem Illopsoas 5 4+   KE L2/3/4 Fem Quad 5 5   DF L4/5 D Peron Tib Ant 5 4+   PF S1/2 Tibial Grc/Sol 5 5    Sensation:  Light touch  Paresthesias in right upper extremity.   Pin prick    Temperature    Vibration   Proprioception    Coordination/Complex Motor:  - Finger to Nose with ataxia in left upper extremity more so than right upper extremity - Heel to shin ataxia and left lower extremity - Rapid alternating movement are slowed bilaterally - Gait: Deferred for patient's safety. Labs   CBC:  Recent Labs  Lab 02/26/22 1220  WBC 10.4  HGB 14.8  HCT 47.0*  MCV 89.7  PLT 288    Basic Metabolic Panel:  Lab Results  Component Value Date   NA 139 02/26/2022   K 3.8 02/26/2022   CO2 19 (L) 02/26/2022   GLUCOSE 164 (H) 02/26/2022   BUN 14 02/26/2022   CREATININE 1.09 (H) 02/26/2022   CALCIUM 9.4 02/26/2022   GFRNONAA 60 (L) 02/26/2022   GFRAA >60 08/30/2018   Lipid Panel:  Lab Results  Component Value Date   LDLCALC 106 (H) 03/01/2021   HgbA1c:  Lab Results  Component Value Date   HGBA1C 10.6 (H) 08/28/2018   Urine Drug Screen:     Component Value Date/Time   LABOPIA NONE DETECTED 08/28/2018 1143   COCAINSCRNUR NONE DETECTED 08/28/2018 1143   LABBENZ NONE DETECTED 08/28/2018 1143   AMPHETMU NONE DETECTED 08/28/2018 1143   THCU NONE DETECTED 08/28/2018 1143   LABBARB NONE DETECTED 08/28/2018 1143    Alcohol  Level No results found for: "ETH"  CT Head without contrast(Personally reviewed): CTH was negative for a large hypodensity concerning for a large territory infarct or hyperdensity concerning for an ICH  CT angio Head and Neck with contrast: pending  MRI Brain(Personally reviewed): 1. Small acute infarcts in the left lateral medulla, right pontine medullary junction, and left cerebellum. 2. Remote infarcts in the right cerebellum and bilateral cerebral cortex.  Impression   Michelle Brooks is a 57 y.o. female with PMH significant for DM2 with retinopathy, HTN, HLD, GERD, who presents with nausea/vomiting, difficulty swallowing, left eyelid droop, numbness on the right and  paresthesias since tuesday evening.  She had further workup with MRI Brain which demonstrated left medullary, right pontine and left cerebellar stroke.  Etiology unclear, question embolic vs poorly controlled risk factors for small vessel disease.  Recommendations  - Frequent Neuro checks per stroke unit protocol - Recommend brain imaging with MRI Brain without contrast - Recommend Vascular imaging with CTA H + N. - Recommend obtaining TTE  - Recommend obtaining Lipid panel with LDL - Please start statin if LDL > 70 - Recommend HbA1c to evaluate for diabetes and how well it is controlled. - Antithrombotic -aspirin 81 mg daily along with Plavix 75 mg daily for 21 days, followed by Plavix 75 mg daily alone. - Recommend DVT ppx - SBP goal - permissive hypertension first 24 h < 220/110. Held home meds.  - Recommend Telemetry monitoring for arrythmia - Recommend bedside swallow screen prior to PO intake. - Stroke education booklet - Recommend PT/OT/SLP consult  ______________________________________________________________________   Thank you for the opportunity to take part in the care of this patient. If you have any further questions, please contact the neurology consultation attending.  Signed,  [place] Pager Number [phone] _ _ _   _ __   _ __ _ _  __ __   _ __   __ _

## 2022-02-27 NOTE — ED Notes (Signed)
Patient expressed being ready for MRI. MRI called

## 2022-02-27 NOTE — Progress Notes (Addendum)
STROKE TEAM PROGRESS NOTE   INTERVAL HISTORY Her daughter is at the bedside.  Extensive stroke history- daughter states first stroke was in 2011 when they lived in [place].  She has had 2 additional strokes, one event in 2016 and event in 2020 both of which were seen here.  In 2020 she had a loop recorder placed as of November 2023 she has had no A-fib burden.  She is drowsy, but awakens to voice and is able to follow commands. MRI scan shows multiple acute infarcts involving left lateral medulla, right pontomedullary junction and left cerebellum.  Remote age infarcts are noted in right cerebellum and bilateral cerebral cortex.  CT angiogram shows progressive intracranial occlusive stenosis involving left cavernous/paraclinoid ICA, moderate right cavernous/paraclinoid ICA, M1 middle cerebral arteries which is progressed from previous study from 2020 Vitals:   02/27/22 0749 02/27/22 1027 02/27/22 1248 02/27/22 1459  BP: (!) 174/76 (!) 184/86 (!) 186/87 (!) 185/98  Pulse: 76 82 83 94  Resp: 16 16 20 19   Temp: 97.9 F (36.6 C)  98.6 F (37 C)   TempSrc: Oral  Oral   SpO2: 96% 97% 93% 94%  Weight:      Height:       CBC:  Recent Labs  Lab 02/26/22 1220  WBC 10.4  HGB 14.8  HCT 47.0*  MCV 89.7  PLT 288   Basic Metabolic Panel:  Recent Labs  Lab 02/26/22 1220  NA 139  K 3.8  CL 106  CO2 19*  GLUCOSE 164*  BUN 14  CREATININE 1.09*  CALCIUM 9.4   Lipid Panel:  Recent Labs  Lab 02/27/22 1025  CHOL 174  TRIG 98  HDL 36*  CHOLHDL 4.8  VLDL 20  LDLCALC 03/01/22*   HgbA1c:  Recent Labs  Lab 02/27/22 1025  HGBA1C 8.7*   Urine Drug Screen:  Recent Labs  Lab 02/27/22 0552  LABOPIA NONE DETECTED  COCAINSCRNUR NONE DETECTED  LABBENZ NONE DETECTED  AMPHETMU NONE DETECTED  THCU NONE DETECTED  LABBARB NONE DETECTED    Alcohol Level No results for input(s): "ETH" in the last 168 hours.  IMAGING past 24 hours ECHOCARDIOGRAM COMPLETE BUBBLE STUDY  Result Date:  02/27/2022    ECHOCARDIOGRAM REPORT   Patient Name:   Michelle Brooks Date of Exam: 02/27/2022 Medical Rec #:  03/01/2022      Height:       68.0 in Accession #:    [id number]     Weight:       250.0 lb Date of Birth:  1965-08-24       BSA:          2.247 m Patient Age:    57 years       BP:           184/86 mmHg Patient Gender: F              HR:           80 bpm. Exam Location:  Inpatient Procedure: 2D Echo, Color Doppler, Cardiac Doppler and Saline Contrast Bubble            Study Indications:    Stroke i63.9;  History:        Patient has prior history of Echocardiogram examinations, most                 recent 08/30/2018. Risk Factors:Hypertension, Diabetes and  Dyslipidemia.  Sonographer:    Raquel Sarna Senior RDCS Referring Phys: [id number] [place]  1. Left ventricular ejection fraction, by estimation, is 60 to 65%. The left ventricle has normal function. The left ventricle has no regional wall motion abnormalities. There is mild concentric left ventricular hypertrophy. Left ventricular diastolic parameters are consistent with Grade I diastolic dysfunction (impaired relaxation).  2. Right ventricular systolic function is normal. The right ventricular size is normal. Tricuspid regurgitation signal is inadequate for assessing PA pressure.  3. No evidence of mitral valve regurgitation.  4. The aortic valve is tricuspid. Aortic valve regurgitation is not visualized.  5. The inferior vena cava is normal in size with greater than 50% respiratory variability, suggesting right atrial pressure of 3 mmHg.  6. Agitated saline contrast bubble study was negative, with no evidence of any interatrial shunt. Comparison(s): No significant change from prior study. Conclusion(s)/Recommendation(s): Prior TEE in 2020 showed possible very small PFO or AVM. No significant PFO seen on TTE. With recurrent stroke, recommend repeat TEE with more bicaval views (3D imaging) to determine if has PFO amenable for  closure. Can also r/o veg related emboli. ILR did not show afib. FINDINGS  Left Ventricle: Left ventricular ejection fraction, by estimation, is 60 to 65%. The left ventricle has normal function. The left ventricle has no regional wall motion abnormalities. The left ventricular internal cavity size was normal in size. There is  mild concentric left ventricular hypertrophy. Left ventricular diastolic parameters are consistent with Grade I diastolic dysfunction (impaired relaxation). Right Ventricle: The right ventricular size is normal. Right ventricular systolic function is normal. Tricuspid regurgitation signal is inadequate for assessing PA pressure. Left Atrium: Left atrial size was normal in size. Right Atrium: Right atrial size was normal in size. Pericardium: There is no evidence of pericardial effusion. Mitral Valve: No evidence of mitral valve regurgitation. Tricuspid Valve: Tricuspid valve regurgitation is not demonstrated. Aortic Valve: The aortic valve is tricuspid. Aortic valve regurgitation is not visualized. Pulmonic Valve: Pulmonic valve regurgitation is not visualized. Aorta: The aortic root and ascending aorta are structurally normal, with no evidence of dilitation. Venous: The inferior vena cava is normal in size with greater than 50% respiratory variability, suggesting right atrial pressure of 3 mmHg. IAS/Shunts: Agitated saline contrast was given intravenously to evaluate for intracardiac shunting. Agitated saline contrast bubble study was negative, with no evidence of any interatrial shunt.  LEFT VENTRICLE PLAX 2D LVIDd:         4.10 cm   Diastology LVIDs:         2.90 cm   LV e' medial:    6.64 cm/s LV PW:         1.70 cm   LV E/e' medial:  12.1 LV IVS:        1.10 cm   LV e' lateral:   8.38 cm/s LVOT diam:     2.20 cm   LV E/e' lateral: 9.6 LV SV:         68 LV SV Index:   30 LVOT Area:     3.80 cm  RIGHT VENTRICLE RV S prime:     14.80 cm/s TAPSE (M-mode): 2.2 cm LEFT ATRIUM              Index        RIGHT ATRIUM           Index LA diam:        3.30 cm 1.47 cm/m   RA Area:  13.90 cm LA Vol (A2C):   46.1 ml 20.52 ml/m  RA Volume:   30.90 ml  13.75 ml/m LA Vol (A4C):   52.2 ml 23.23 ml/m LA Biplane Vol: 49.7 ml 22.12 ml/m  AORTIC VALVE LVOT Vmax:   93.30 cm/s LVOT Vmean:  57.100 cm/s LVOT VTI:    0.178 m  AORTA Ao Root diam: 3.50 cm Ao Asc diam:  3.40 cm MITRAL VALVE MV Area (PHT): 2.81 cm     SHUNTS MV Decel Time: 270 msec     Systemic VTI:  0.18 m MV E velocity: 80.10 cm/s   Systemic Diam: 2.20 cm MV A velocity: 109.00 cm/s MV E/A ratio:  0.73 Photographer signed by Carolan Clines Signature Date/Time: 02/27/2022/11:44:43 AM    Final    CT ANGIO HEAD NECK W WO CM  Result Date: 02/27/2022 CLINICAL DATA:  Neuro deficit, acute, stroke suspected EXAM: CT ANGIOGRAPHY HEAD AND NECK TECHNIQUE: Multidetector CT imaging of the head and neck was performed using the standard protocol during bolus administration of intravenous contrast. Multiplanar CT image reconstructions and MIPs were obtained to evaluate the vascular anatomy. Carotid stenosis measurements (when applicable) are obtained utilizing NASCET criteria, using the distal internal carotid diameter as the denominator. RADIATION DOSE REDUCTION: This exam was performed according to the departmental dose-optimization program which includes automated exposure control, adjustment of the mA and/or kV according to patient size and/or use of iterative reconstruction technique. CONTRAST:  62mL OMNIPAQUE IOHEXOL 350 MG/ML SOLN COMPARISON:  MRI head from the same day. CTA head/neck from 08/28/2018. FINDINGS: CTA NECK FINDINGS Aortic arch: Great vessel origins are patent without significant stenosis. Right carotid system: No evidence of dissection, stenosis (50% or greater), or occlusion. Mild atherosclerosis. Left carotid system: No evidence of dissection, stenosis (50% or greater), or occlusion. Mild atherosclerosis. Vertebral arteries:  Evaluation of the proximal vertebral arteries limited by streak artifact. Non opacification of the left vertebral artery proximally and poor opacification of the right vertebral artery, suspicious for interval proximal occlusion and/or stenosis. Distal reconstitution bilaterally. Skeleton: Multilevel degenerative change. Other neck: Thyroid goiter with multiple nodules, as previously characterized. Upper chest: The visualized lung apices demonstrate areas of emphysema and fibrosis/scarring. 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 Moderate right cavernous/paraclinoid ICA stenosis. Bilaterally there is occlusive or near occlusive stenosis of the M1 MCAs, likely mildly progressed from the prior. There are collaterals bilaterally. The left A1 ACA is diminutive. The right A1 ACA more distal ACAs are patent. Posterior circulation: Small vertebrobasilar system. The left intradural vertebral artery is stenotic but remains patent. The right intradural vertebral artery terminates as PICA, anatomic variant. Irregular opacification of the basilar artery, similar. The posterior communicating arteries largely supply the posterior cerebral areas which remain patent. Venous sinuses: As permitted by contrast timing, patent. Anatomic variants: Detailed above. Review of the MIP images confirms the above findings IMPRESSION: 1. In comparison to 2020 CTA, some progression of occlusive intracranial disease, suggestive of moyamoya type process and described above. 2. Evaluation of the proximal vertebral arteries limited by streak artifact with apparent non-opacification of the left vertebral artery proximally and poor opacification right vertebral artery, suspicious for interval proximal occlusion and/or stenosis. Distal reconstitution bilaterally. Intracranially, posterior cerebral arteries are largely  supplied by posterior communicating arteries. Electronically Signed   By: Feliberto Harts M.D.   On: 02/27/2022 08:34   MR BRAIN WO CONTRAST  Result  Date: 02/27/2022 CLINICAL DATA:  Stroke follow-up, evaluate new hypodensity by CT. EXAM: MRI HEAD WITHOUT CONTRAST TECHNIQUE: Multiplanar, multiecho pulse sequences of the brain and surrounding structures were obtained without intravenous contrast. COMPARISON:  11/19/2020 FINDINGS: Brain: Small acute infarcts seen in the low right pontine medullary junction, left lateral medulla, and clustered in the left cerebellum. Pre-existing right cerebellar infarction. Small cortical infarcts have occurred along the cerebral convexities, greatest at the high right frontal and left parietal cortex. Brain volume is normal. No acute hemorrhage, hydrocephalus, or masslike finding Vascular: Grossly preserved flow voids when compared to prior. Skull and upper cervical spine: Normal marrow signal. Sinuses/Orbits: Negative. IMPRESSION: 1. Small acute infarcts in the left lateral medulla, right pontine medullary junction, and left cerebellum. 2. Remote infarcts in the right cerebellum and bilateral cerebral cortex. Electronically Signed   By: Tiburcio Pea M.D.   On: 02/27/2022 05:18   CT Head Wo Contrast  Result Date: 02/26/2022 CLINICAL DATA:  Transient ischemic attack. Nausea and vomiting. History of strokes. EXAM: CT HEAD WITHOUT CONTRAST TECHNIQUE: Contiguous axial images were obtained from the base of the skull through the vertex without intravenous contrast. RADIATION DOSE REDUCTION: This exam was performed according to the departmental dose-optimization program which includes automated exposure control, adjustment of the mA and/or kV according to patient size and/or use of iterative reconstruction technique. COMPARISON:  11/19/2020. FINDINGS: Brain: No acute intracranial hemorrhage, midline shift or mass effect. No extra-axial fluid collection. Encephalomalacia is  present in the frontal lobe on the right, unchanged. There is a old infarct in the right cerebellar hemisphere. Scattered periventricular white matter hypodensities are present bilaterally. No hydrocephalus. A vague hypodense region is noted in the frontal lobe on the left which is new from the prior exam. Vascular: No hyperdense vessel or unexpected calcification. Skull: Normal. Negative for fracture or focal lesion. Sinuses/Orbits: A round density is present in the right maxillary sinus, possible mucosal retention cyst or polyp. No acute orbital abnormality. Other: None. IMPRESSION: 1. Vague hypodensity in the frontal lobe on the left which is new from the previous exam. MRI is suggested for further evaluation. 2. Chronic microvascular ischemic changes with old infarcts in the right cerebellar hemisphere and right frontal lobe. Electronically Signed   By: Thornell Sartorius M.D.   On: 02/26/2022 21:31    PHYSICAL EXAM  Physical Exam  Constitutional: Mildly obese middle-aged African-American lady Cardiovascular: Normal rate and regular rhythm.  Respiratory: Effort normal, non-labored breathing  Neuro: Mental Status: Patient is awake, alert, oriented to person, place, month, year, and situation. Mild dysarthria No signs of aphasia or neglect Cranial Nerves: II: Visual Fields are full. Pupils are equal, round, and reactive to light.   III,IV, VI: gaze is dysconjugate, left eye lid closed.  Nystagmus to the left V: Facial sensation is symmetric  VII: Left facial droop VIII: Hearing is intact to voice X: Palate elevates symmetrically XI: Shoulder shrug is symmetric. XII: Tongue protrudes midline without atrophy or fasciculations.  Motor: Tone is normal. Bulk is normal. No drift in any extremities  Left hand weakness mild left leg drift. Sensory: Sensation is symmetric to light touch and temperature in the arms and legs.  Cerebellar: Ataxia on with FNF on the left    ASSESSMENT/PLAN Ms.  Michelle Brooks is a 57 y.o. female with history of DM2 with retinopathy, HTN, HLD, GERD, who presents with nausea/vomiting, difficulty swallowing, left eyelid droop, numbness on the right and paresthesias since tuesday evening. She does have a loop  recorder that was placed 08/2018. No afib events as of 12/2021.     Stroke:  left medullary, right pontine and left cerebellar stroke.  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 CTA head & neck progression of occlusive intracranial disease, suggestive of moyamoya type process. apparent non-opacification of the left vertebral artery proximally and poor opacification right vertebral artery, suspicious for interval proximal occlusion and/or stenosis. Distal reconstitution bilaterally. Intracranially, posterior cerebral arteries are largely supplied by posterior communicating arteries.  MRI  left medullary, right pontine and left cerebellar stroke 2D Echo 60-65% LDL 118 HgbA1c 8.7 VTE prophylaxis - lovenox     Diet   Diet NPO time specified   aspirin 81 mg daily prior to admission, now on aspirin 81 mg daily and clopidogrel 75 mg daily. x 3 months and then Plavix alone Therapy recommendations:  pending Disposition:  pending   Hypertension Stable Permissive hypertension (OK if < 220/120) but gradually normalize in 5-7 days Long-term BP goal normotensive  Hyperlipidemia Home meds:  pravastatin 80mg , resumed in hospital LDL 118, goal < 70 Continue statin at discharge  Diabetes type II Uncontrolled Home meds:  dulaglutide, januvia, insulin HgbA1c 8.7, goal < 7.0 CBGs Recent Labs    02/27/22 0312 02/27/22 1246  GLUCAP 208* 214*    SSI  Other Stroke Risk Factors Former cigarette smoker - quit Obesity, Body mass index is 40.12 kg/m., recommend weight loss, diet and exercise  as appropriate  Hx stroke/TIA Daughter states first stroke was in 2011 08/2018- acute right cerebellar infarct  08/2014- Acute subcentimeter infarct of the RIGHT lateral medulla and RIGHT inferior cerebellar peduncle, within the distribution of the RIGHT posterior-inferior cerebellar artery.  Other Active Problems Latex allergy Cre - 1.29  Hospital day # 0  Patient seen and examined by NP/APP with MD. MD to update note as needed.   Janine Ores, DNP, FNP-BC [facility] Pager: [phone]  STROKE MD NOTE :  I have personally obtained history,examined this patient, reviewed notes, independently viewed imaging studies, participated in medical decision making and plan of care.ROS completed by me personally and pertinent positives fully documented  I have made any additions or clarifications directly to the above note. Agree with note above.  Patient presented with dysarthria and left-sided weakness and MRI scan shows multifocal posterior circulation infarcts.  She had multiple prior strokes and CT angiogram shows significantly progress intracranial atherosclerotic changes with near more Marbert appearance.  Patient also has uncontrolled risk factors.  Recommend dual antiplatelet therapy aspirin and Plavix for 3 months followed by Plavix alone and aggressive risk factor modification.  Physical therapy Occupational Therapy and speech therapy consults.  She will likely need inpatient rehab.  Greater than 50% time during this 50-minute visit was spent in counseling and coordination of care about her strokes discussion about evaluation and treatment.  Long discussion with daughter at the bedside and answered questions.  Antony Contras, MD Medical Director [facility] Pager: [phone] 02/27/2022 5:57 PM   To contact Stroke Continuity provider, please refer to [email or web address] After hours, contact General Neurology

## 2022-02-27 NOTE — ED Provider Notes (Signed)
[facility] EMERGENCY DEPARTMENT Provider Note   CSN: [id number] Arrival date & time: 02/26/22  1125     History  Chief Complaint  Patient presents with   Emesis    Michelle Brooks is a 57 y.o. female.  57 year old female with a history of stroke and diabetes who presents the ER today secondary to multiple symptoms.  Her daughter is with her who helps supplement the history.  It sounds like the patient started not feeling well with throwing up and paresthesias and numbness on Tuesday evening.  She was brought in on Wednesday morning to be evaluated as she had similar symptoms in the past with strokes.  Soundly she is having difficulty swallowing as well and also having difficulty with moving her right extremity and her left eyelid is drooping.  No recent illnesses.  She is lactose intolerant but usually has diarrhea that not just emesis.  Patient has a sensation like she cannot swallow and that her throat is closing.   Emesis      Home Medications Prior to Admission medications   Medication Sig Start Date End Date Taking? Authorizing Provider  aspirin EC 81 MG tablet Take by mouth.    [provider]  Blood Glucose Monitoring Suppl (ACCU-CHEK AVIVA PLUS) W/DEVICE KIT 1 kit by Does not apply route 3 (three) times daily before meals. 06/24/12   Kalia-Reynolds, Maitri S, DO  Blood Glucose Monitoring Suppl (TRUE METRIX METER) DEVI 1 each by Does not apply route 4 (four) times daily -  before meals and at bedtime. 09/15/14   Hoy Register, MD  clobetasol (OLUX) 0.05 % topical foam Apply topically 2 (two) times daily. 08/07/21   Janalyn Harder, MD  glucose blood (TRUE METRIX BLOOD GLUCOSE TEST) test strip Use as instructed 09/15/14   Hoy Register, MD  insulin lispro protamine-lispro (HUMALOG 50/50) (50-50) 100 UNIT/ML SUSP injection Inject 80 Units into the skin 3 (three) times daily. Up to 60 depending on cbg    [provider]  JANUVIA 100 MG tablet  Take 100 mg by mouth daily. 07/02/18   [provider]  pravastatin (PRAVACHOL) 80 MG tablet Take 1 tablet (80 mg total) by mouth daily. 11/01/20   Ihor Austin, NP  TRUEPLUS LANCETS 28G MISC 1 each by Does not apply route 4 (four) times daily -  before meals and at bedtime. 09/18/14   Hoy Register, MD  TRULICITY 4.5 MG/0.5ML [facility]  03/12/21   [provider]  valsartan (DIOVAN) 160 MG tablet  03/12/21   [provider]      Allergies    Lactose intolerance (gi) and Latex    Review of Systems   Review of Systems  Gastrointestinal:  Positive for vomiting.    Physical Exam Updated Vital Signs BP (!) 175/104   Pulse 81   Temp 98.5 F (36.9 C) (Oral)   Resp 17   Ht 5\' 8"  (1.727 m)   Wt 113.4 kg   LMP 03/30/2016   SpO2 99%   BMI 38.01 kg/m  Physical Exam Vitals and nursing note reviewed.  Constitutional:      Appearance: She is well-developed.  HENT:     Head: Normocephalic and atraumatic.     Mouth/Throat:     Mouth: Mucous membranes are moist.  Eyes:     Pupils: Pupils are equal, round, and reactive to light.  Cardiovascular:     Rate and Rhythm: Normal rate and regular rhythm.  Pulmonary:  Effort: No respiratory distress.     Breath sounds: No stridor.  Abdominal:     General: Abdomen is flat. There is no distension.  Musculoskeletal:     Cervical back: Normal range of motion.  Skin:    General: Skin is warm and dry.  Neurological:     General: No focal deficit present.     Mental Status: She is alert.     ED Results / Procedures / Treatments   Labs (all labs ordered are listed, but only abnormal results are displayed) Labs Reviewed  COMPREHENSIVE METABOLIC PANEL - Abnormal; Notable for the following components:      Result Value   CO2 19 (*)    Glucose, Bld 164 (*)    Creatinine, Ser 1.09 (*)    Total Bilirubin 0.2 (*)    GFR, Estimated 60 (*)    All other components within normal limits  CBC - Abnormal; Notable for the  following components:   RBC 5.24 (*)    HCT 47.0 (*)    All other components within normal limits  LIPASE, BLOOD  URINALYSIS, ROUTINE W REFLEX MICROSCOPIC  PROTIME-INR  APTT  RAPID URINE DRUG SCREEN, HOSP PERFORMED  URINALYSIS, ROUTINE W REFLEX MICROSCOPIC  I-STAT BETA HCG BLOOD, ED ([facility], [facility], [facility] ONLY)  CBG MONITORING, ED    EKG None  Radiology CT Head Wo Contrast  Result Date: 02/26/2022 CLINICAL DATA:  Transient ischemic attack. Nausea and vomiting. History of strokes. EXAM: CT HEAD WITHOUT CONTRAST TECHNIQUE: Contiguous axial images were obtained from the base of the skull through the vertex without intravenous contrast. RADIATION DOSE REDUCTION: This exam was performed according to the departmental dose-optimization program which includes automated exposure control, adjustment of the mA and/or kV according to patient size and/or use of iterative reconstruction technique. COMPARISON:  11/19/2020. FINDINGS: Brain: No acute intracranial hemorrhage, midline shift or mass effect. No extra-axial fluid collection. Encephalomalacia is present in the frontal lobe on the right, unchanged. There is a old infarct in the right cerebellar hemisphere. Scattered periventricular white matter hypodensities are present bilaterally. No hydrocephalus. A vague hypodense region is noted in the frontal lobe on the left which is new from the prior exam. Vascular: No hyperdense vessel or unexpected calcification. Skull: Normal. Negative for fracture or focal lesion. Sinuses/Orbits: A round density is present in the right maxillary sinus, possible mucosal retention cyst or polyp. No acute orbital abnormality. Other: None. IMPRESSION: 1. Vague hypodensity in the frontal lobe on the left which is new from the previous exam. MRI is suggested for further evaluation. 2. Chronic microvascular ischemic changes with old infarcts in the right cerebellar hemisphere and right frontal lobe. Electronically Signed   By: Brett Fairy  M.D.   On: 02/26/2022 21:31    Procedures Procedures  CRITICAL CARE Performed by: Merrily Pew Total critical care time: 35 minutes Critical care time was exclusive of separately billable procedures and treating other patients. Critical care was necessary to treat or prevent imminent or life-threatening deterioration. Critical care was time spent personally by me on the following activities: development of treatment plan with patient and/or surrogate as well as nursing, discussions with consultants, evaluation of patient's response to treatment, examination of patient, obtaining history from patient or surrogate, ordering and performing treatments and interventions, ordering and review of laboratory studies, ordering and review of radiographic studies, pulse oximetry and re-evaluation of patient's condition.   Medications Ordered in ED Medications  diphenhydrAMINE (BENADRYL) injection 25 mg (has no administration in time  range)  lactated ringers bolus 1,000 mL (has no administration in time range)  metoCLOPramide (REGLAN) injection 10 mg (has no administration in time range)  ondansetron (ZOFRAN-ODT) disintegrating tablet 4 mg (4 mg Oral Given 02/26/22 1206)  hydrALAZINE (APRESOLINE) tablet 25 mg (25 mg Oral Given 02/26/22 1206)    ED Course/ Medical Decision Making/ A&P   35                          Medical Decision Making Amount and/or Complexity of Data Reviewed Labs: ordered.  Risk Prescription drug management. Decision regarding hospitalization.  57 yo F here with likely stroke on CT scan (per radiology report, I can't visualize area of hypodensity on CT myself) and c/w history.  Even though she has been waiting for greater than 13 hours to be seen she is well outside the window even on arrival for any kind of intervention as she is not LVO positive.  Will attempt to get her symptoms better to get MRI but if not we will just discuss with neurology and probably admit to the  hospitalist for symptom control prior to MRI. MRI does show multiple acute infarcts.  Will allow permissive hypertension at this time.  Emesis is better.  Discussed with neurology who will consult.  Discussed with hospitalist for admission.         Final Clinical Impression(s) / ED Diagnoses Final diagnoses:  None    Rx / DC Orders ED Discharge Orders     None         Tonna Palazzi, Corene Cornea, MD 02/27/22 2334

## 2022-02-27 NOTE — ED Notes (Signed)
Patient transported to MRI 

## 2022-02-27 NOTE — Progress Notes (Signed)
Echocardiogram 2D Echocardiogram has been performed.  Oneal Deputy Roch Quach RDCS 02/27/2022, 11:30 AM

## 2022-02-27 NOTE — Inpatient Diabetes Management (Signed)
Inpatient Diabetes Program Recommendations  AACE/ADA: New Consensus Statement on Inpatient Glycemic Control (2015)  Target Ranges:  Prepandial:   less than 140 mg/dL      Peak postprandial:   less than 180 mg/dL (1-2 hours)      Critically ill patients:  140 - 180 mg/dL   Lab Results  Component Value Date   GLUCAP 214 (H) 02/27/2022   HGBA1C 8.7 (H) 02/27/2022    Latest Reference Range & Units 02/27/22 03:12 02/27/22 12:46  Glucose-Capillary 70 - 99 mg/dL 208 (H) 214 (H)  (H): Data is abnormally high  Diabetes history: DM2 Outpatient Diabetes medications: Humalog 50/50 insulin sliding scale ac meals bid, Januvia 100 mg qd Current orders for Inpatient glycemic control: Novolog 0-15 units tid + hs 0-5 units correction  Inpatient Diabetes Program Recommendations:   Spoke with patient @ bedside in ED hallway to clarify home insulin she is taking. Patient states Lantus + Humalog did not control her blood sugar levels so her PCP placed on Humalog 50/50 sliding scale bid ac meals x 2 years. Patient has a glucose meter and supplies and fasting is approximately 100.  Will follow while inpatient.  Thank you, Nani Gasser. Wyonia Fontanella, RN, MSN, CDE  Diabetes Coordinator Inpatient Glycemic Control Team Team Pager [phone] (8am-5pm) 02/27/2022 1:36 PM

## 2022-02-27 NOTE — Evaluation (Signed)
Speech Language Pathology Evaluation Patient Details Name: Michelle Brooks MRN: [id number] DOB: 05/06/1965 Today's Date: 02/27/2022 Time: 1200-1230 SLP Time Calculation (min) (ACUTE ONLY): 30 min  Problem List:  Patient Active Problem List   Diagnosis Date Noted   Acute CVA (cerebrovascular accident) ([place]) 02/27/2022   Allergic reaction to food 02/27/2022   Hematuria 10/08/2020   CVA (cerebral vascular accident) ([place]) 08/27/2018   Dehydration    Vomiting in adult    Right hemiparesis ([place]) 10/05/2014   Diabetic neuropathy ([place]) 09/15/2014   GERD (gastroesophageal reflux disease) 09/15/2014   Stroke ([place]) 08/18/2014   CVA (cerebral infarction) 08/18/2014   Hot flashes 12/03/2012   High risk sexual behavior 05/13/2012   Chronic low back pain 12/29/2011   Obesity 10/23/2011   Financial difficulties 10/23/2011   Poor dentition 10/23/2011   Paresthesia of left arm and leg 10/23/2011   Preventative health care 05/13/2011   Dyslipidemia 04/01/2011   Hypertension 03/27/2011   History of stroke 03/13/2010   Type 2 diabetes mellitus with hyperlipidemia ([place]) 02/11/2004   Past Medical History:  Past Medical History:  Diagnosis Date   Chronic back pain    Diabetes mellitus type 2 in obese ([place])    Diabetic retinopathy ([place])    GERD (gastroesophageal reflux disease)    Headache    History of stroke 03/2010   Right MCA stroke in 03/2010, with MRI also noting subacute strokes in left fronto parietal area - occured in [place] received care at [facility] thought due to uncontrolled blood pressure // No residual deficits // TTE (03/2010) at [facility] - normal LV systolic function, moderate pericardial effusion with 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   HSV-2 infection    Hyperlipidemia LDL goal < 100 04/01/2011   Hypertension    Lumbago    pain in the back   Past Surgical History:  Past Surgical History:  Procedure Laterality Date    BREAST LUMPECTOMY  2002   states benign lymph node removed.   BUBBLE STUDY  08/30/2018   Procedure: BUBBLE STUDY;  Surgeon: Thayer Headings, MD;  Location: [facility];  Service: Cardiovascular;;   ENDOMETRIAL BIOPSY  10/04/2020   LOOP RECORDER INSERTION N/A 08/30/2018   Procedure: LOOP RECORDER INSERTION;  Surgeon: Evans Lance, MD;  Location: [facility] CV LAB;  Service: Cardiovascular;  Laterality: N/A;   TEE WITHOUT CARDIOVERSION N/A 08/30/2018   Procedure: TRANSESOPHAGEAL ECHOCARDIOGRAM (TEE);  Surgeon: Acie Fredrickson Wonda Cheng, MD;  Location: [facility] ENDOSCOPY;  Service: Cardiovascular;  Laterality: N/A;   HPI:  Michelle Brooks is a 57 y.o. female with medical history significant of chronic back pain, DM, HTN, prior CVA, and HLD presenting with emesis.  She has a h/o prior CVAs and these have historically presented with n/v.  She ate a lot of shrimp (no known allergy) and awoke yesterday AM with n/v.  She also noticed some dysphagia, dysarthria, tongue swelling, and eventual left-sided weakness (facial droop as well as LUE/LLE).   Her symptoms have progressed some while in the ER.  The patient appears to be more concerned about possible allergic reaction as the cause for her difficulty swallowing. Speech/language evaluation generated   Assessment / Plan / Recommendation Clinical Impression  Pt seen for limited speech/language cognitive assessment with pt oriented x4, able to follow 3-step commands with min cues, verbally expressed concerns and appeared to name items without difficulty.  Pt with impaired sustained attention to task such as  digit reversal (daughter stated family has hx of dyslexia), recalling new information and word fluency task required min verbal cues for category to complete.  Baseline memory recall from last CVA reported by pt/daughter, but otherwise pt able to ambulate, handle personal finances and perform ADLs prior to this hospitalization.  Speech is 75% intelligible within simple  conversation with hoarse vocal quality and hypophonic in nature.  ST will f/u for ongoing cognitive/linguistic concerns and dysphagia tx while in acute setting.    SLP Assessment  SLP Recommendation/Assessment: Patient needs continued Speech Lanaguage Pathology Services SLP Visit Diagnosis: Dysphagia, unspecified (R13.10);Dysarthria and anarthria (R47.1);Attention and concentration deficit;Cognitive communication deficit (R41.841) Attention and concentration deficit following: Cerebral infarction    Recommendations for follow up therapy are one component of a multi-disciplinary discharge planning process, led by the attending physician.  Recommendations may be updated based on patient status, additional functional criteria and insurance authorization.    Follow Up Recommendations  Other (comment) (TBD)    Assistance Recommended at Discharge  Frequent or constant Supervision/Assistance  Functional Status Assessment Patient has had a recent decline in their functional status and demonstrates the ability to make significant improvements in function in a reasonable and predictable amount of time.  Frequency and Duration min 2x/week  1 week      SLP Evaluation Cognition  Overall Cognitive Status: Difficult to assess Arousal/Alertness: Awake/alert Orientation Level: Oriented X4 Year: 2024 Month: January Day of Week: Correct Attention: Sustained Sustained Attention: Impaired Sustained Attention Impairment: Verbal basic;Functional basic Memory: Impaired Memory Impairment: Retrieval deficit;Decreased short term memory Decreased Short Term Memory: Verbal basic;Functional basic Awareness: Appears intact Behaviors: Restless;Perseveration Comments: DTA cognition       Comprehension  Auditory Comprehension Overall Auditory Comprehension: Impaired Yes/No Questions: Within Functional Limits Commands: Impaired Multistep Basic Commands: 50-74% accurate Conversation: Simple Interfering  Components: Attention;Working memory EffectiveTechniques: Repetition Retail banker: Not tested Reading Comprehension Reading Status: Impaired Functional Environmental (signs, name badge): Within functional limits Interfering Components: Other (comment) (reported diplopia)    Expression Expression Primary Mode of Expression: Verbal Verbal Expression Overall Verbal Expression: Appears within functional limits for tasks assessed Level of Generative/Spontaneous Verbalization: Conversation Naming: Not tested Pragmatics: Unable to assess Interfering Components: Attention;Premorbid deficit Non-Verbal Means of Communication: Eye blink Written Expression Dominant Hand: Right Written Expression: Unable to assess (comment) (d/t visual deficits)   Oral / Motor  Oral Motor/Sensory Function Overall Oral Motor/Sensory Function: Mild impairment Facial ROM: Reduced left Facial Symmetry: Abnormal symmetry left Facial Strength: Within Functional Limits Lingual Symmetry: Within Functional Limits Motor Speech Overall Motor Speech: Appears within functional limits for tasks assessed Respiration: Within functional limits Phonation: Hoarse;Low vocal intensity Resonance: Within functional limits Articulation: Impaired Level of Impairment: Word Intelligibility: Intelligibility reduced Word: 75-100% accurate Phrase: 75-100% accurate Sentence: 75-100% accurate Motor Planning: Witnin functional limits Motor Speech Errors: Not applicable Interfering Components: Inadequate dentition Effective Techniques: Increased vocal intensity            Pat Khori Rosevear,M.S., CCC-SLP 02/27/2022, 2:49 PM

## 2022-02-27 NOTE — Progress Notes (Signed)
PT Cancellation Note  Patient Details Name: Michelle Brooks MRN: [id number] DOB: [birth year]   Cancelled Treatment:    Reason Eval/Treat Not Completed: Patient at procedure or test/unavailable.  Gone to Echo, retry as time and pt allow.   Ramond Dial 02/27/2022, 11:32 AM  Mee Hives, PT PhD Acute Rehab Dept. Number: [place] and [place]

## 2022-02-27 NOTE — Evaluation (Signed)
Clinical/Bedside Swallow Evaluation Patient Details  Name: Michelle Brooks MRN: [id number] Date of Birth: Jan 06, 1966  Today's Date: 02/27/2022 Time: SLP Start Time (ACUTE ONLY): 1200 SLP Stop Time (ACUTE ONLY): 8676 SLP Time Calculation (min) (ACUTE ONLY): 30 min  Past Medical History:  Past Medical History:  Diagnosis Date   Chronic back pain    Diabetes mellitus type 2 in obese ([place])    Diabetic retinopathy ([place])    GERD (gastroesophageal reflux disease)    Headache    History of stroke 03/2010   Right MCA stroke in 03/2010, with MRI also noting subacute strokes in left fronto parietal area - occured in [place] received care at [facility] thought due to uncontrolled blood pressure // No residual deficits // TTE (03/2010) at [facility] - normal LV systolic function, moderate pericardial effusion with 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   HSV-2 infection    Hyperlipidemia LDL goal < 100 04/01/2011   Hypertension    Lumbago    pain in the back   Past Surgical History:  Past Surgical History:  Procedure Laterality Date   BREAST LUMPECTOMY  2002   states benign lymph node removed.   BUBBLE STUDY  08/30/2018   Procedure: BUBBLE STUDY;  Surgeon: Thayer Headings, MD;  Location: [facility];  Service: Cardiovascular;;   ENDOMETRIAL BIOPSY  10/04/2020   LOOP RECORDER INSERTION N/A 08/30/2018   Procedure: LOOP RECORDER INSERTION;  Surgeon: Evans Lance, MD;  Location: [facility];  Service: Cardiovascular;  Laterality: N/A;   TEE WITHOUT CARDIOVERSION N/A 08/30/2018   Procedure: TRANSESOPHAGEAL ECHOCARDIOGRAM (TEE);  Surgeon: Acie Fredrickson Wonda Cheng, MD;  Location: [facility];  Service: Cardiovascular;  Laterality: N/A;   HPI:  Michelle Brooks is a 57 y.o. female with medical history significant of chronic back pain, DM, HTN, prior CVA, and HLD presenting with emesis.  She has a h/o prior CVAs and these have historically presented  with n/v.  She ate a lot of shrimp (no known allergy) and awoke yesterday AM with n/v.  She also noticed some dysphagia, dysarthria, tongue swelling, and eventual left-sided weakness (facial droop as well as LUE/LLE).   Her symptoms have progressed some while in the ER.  The patient appears to be more concerned about possible allergic reaction as the cause for her difficulty swallowing. BSE generated    Assessment / Plan / Recommendation  Clinical Impression  Pt seen for limited clinical swallowing evaulation with prolonged oral transit and oral holding noted prior to swallow and pt stating "I can't get it down" when attempting to swallow various consistencies.  Globus sensation/potential for allergic response/anaphylaxsis reported by pt with expulsion of tsp of thin liquid and puree consistencies.  Ice chip did eventually pass through pharynx and was swallowed with multiple swallow attempts.  Recommend NPO with ice chips allowed after oral care (QID or before ice chips attempted) with ST f/u for PO readiness d/t medical concerns.  If allergic response is confirmed, [facility] swallow screen may be completed as ST will not be available until next date.  Thank you for this consult. SLP Visit Diagnosis: Dysphagia, unspecified (R13.10)    Aspiration Risk  Risk for inadequate nutrition/hydration    Diet Recommendation   NPO; ice chips allowed as pt able  Medication Administration: Crushed with puree (if pt can swallow or via alternative means)    Other  Recommendations Oral Care Recommendations: Oral care QID;Oral care prior to  ice chip/H20;Staff/trained caregiver to provide oral care    Recommendations for follow up therapy are one component of a multi-disciplinary discharge planning process, led by the attending physician.  Recommendations may be updated based on patient status, additional functional criteria and insurance authorization.  Follow up Recommendations Other (comment) (TBD)      Assistance  Recommended at Discharge    Functional Status Assessment Patient has had a recent decline in their functional status and demonstrates the ability to make significant improvements in function in a reasonable and predictable amount of time.  Frequency and Duration min 2x/week  1 week       Prognosis Prognosis for Safe Diet Advancement: Fair Barriers to Reach Goals: Other (Comment);Severity of deficits (potential anaphylaxsis response)      Swallow Study   General Date of Onset: 02/27/22 HPI: Michelle Brooks is a 57 y.o. female with medical history significant of chronic back pain, DM, HTN, prior CVA, and HLD presenting with emesis.  She has a h/o prior CVAs and these have historically presented with n/v.  She ate a lot of shrimp (no known allergy) and awoke yesterday AM with n/v.  She also noticed some dysphagia, dysarthria, tongue swelling, and eventual left-sided weakness (facial droop as well as LUE/LLE).   Her symptoms have progressed some while in the ER.  The patient appears to be more concerned about possible allergic reaction as the cause for her difficulty swallowing. BSE generated Type of Study: Bedside Swallow Evaluation Previous Swallow Assessment: n/a Diet Prior to this Study: NPO Temperature Spikes Noted: No Respiratory Status: Room air History of Recent Intubation: No Behavior/Cognition: Alert;Cooperative;Distractible;Requires cueing Oral Cavity Assessment: Dry (coated tongue) Oral Care Completed by SLP: Yes Oral Cavity - Dentition: Dentures, top;Dentures, not available;Adequate natural dentition Vision: Functional for self-feeding Self-Feeding Abilities: Able to feed self;Needs assist Patient Positioning: Upright in bed Baseline Vocal Quality: Hoarse;Low vocal intensity Volitional Cough: Strong Volitional Swallow: Able to elicit    Oral/Motor/Sensory Function Overall Oral Motor/Sensory Function: Mild impairment Facial ROM: Reduced left Facial Symmetry: Abnormal symmetry  left Facial Strength: Within Functional Limits Lingual Symmetry: Within Functional Limits   Ice Chips Ice chips: Impaired Presentation: Spoon Oral Phase Impairments: Reduced lingual movement/coordination Oral Phase Functional Implications: Prolonged oral transit Pharyngeal Phase Impairments: Multiple swallows Other Comments: c/o globus; "bubbling up when I try to swallow"   Thin Liquid Thin Liquid: Impaired Presentation: Spoon Oral Phase Functional Implications: Oral holding Pharyngeal  Phase Impairments: Multiple swallows;Other (comments) (bolus eventually expelled from oral cavity) Other Comments: globus sensation/feeling of "sticking in throat, can't get down'    Nectar Thick Nectar Thick Liquid: Not tested   Honey Thick Honey Thick Liquid: Not tested   Puree Puree: Impaired Presentation: Spoon Oral Phase Impairments: Reduced lingual movement/coordination Oral Phase Functional Implications: Prolonged oral transit Pharyngeal Phase Impairments: Multiple swallows;Other (comments) (pt eventually expelled from oral cavity d/t globus sensation/feeling she "can't get it down") Other Comments: Pt concerned with allergic reaction/anaphylaxsis   Solid     Solid: Not tested      Pat Ryaan Vanwagoner,M.S., CCC-SLP 02/27/2022,2:34 PM

## 2022-02-28 ENCOUNTER — Inpatient Hospital Stay (HOSPITAL_COMMUNITY): Payer: 59

## 2022-02-28 DIAGNOSIS — I639 Cerebral infarction, unspecified: Secondary | ICD-10-CM | POA: Diagnosis not present

## 2022-02-28 DIAGNOSIS — I10 Essential (primary) hypertension: Secondary | ICD-10-CM | POA: Diagnosis not present

## 2022-02-28 DIAGNOSIS — Z794 Long term (current) use of insulin: Secondary | ICD-10-CM | POA: Diagnosis not present

## 2022-02-28 DIAGNOSIS — E1165 Type 2 diabetes mellitus with hyperglycemia: Secondary | ICD-10-CM | POA: Diagnosis not present

## 2022-02-28 LAB — GLUCOSE, CAPILLARY
Glucose-Capillary: 177 mg/dL — ABNORMAL HIGH (ref 70–99)
Glucose-Capillary: 145 mg/dL — ABNORMAL HIGH (ref 70–99)
Glucose-Capillary: 145 mg/dL — ABNORMAL HIGH (ref 70–99)
Glucose-Capillary: 138 mg/dL — ABNORMAL HIGH (ref 70–99)
Glucose-Capillary: 135 mg/dL — ABNORMAL HIGH (ref 70–99)

## 2022-02-28 MED ORDER — ENSURE ENLIVE PO LIQD
237.0000 mL | Freq: Three times a day (TID) | ORAL | Status: DC
  Administered 2022-02-28 – 2022-03-01 (×3): 237 mL via ORAL

## 2022-02-28 MED ORDER — ASPIRIN 300 MG RE SUPP
300.0000 mg | Freq: Every day | RECTAL | Status: DC
  Administered 2022-02-28 – 2022-03-05 (×6): 300 mg via RECTAL
  Filled 2022-02-28 (×6): qty 1

## 2022-02-28 NOTE — Progress Notes (Signed)
Speech Language Pathology Treatment: Dysphagia  Patient Details Name: Michelle Brooks MRN: [id number] DOB: 04/13/1965 Today's Date: 02/28/2022 Time: 4097-3532 SLP Time Calculation (min) (ACUTE ONLY): 15 min  Assessment / Plan / Recommendation Clinical Impression  Pt alert, still with facial droop and expectoration of saliva. However, now able to initiate the swallow with sips of water with minimal throat clearing and hocking. Pt also able to initiate swallow with puree but increased expectoration post swallow with puree texture. Shows signs of improvement. Will f/u with MBS later today for further recommendations.   HPI HPI: Michelle Brooks is a 57 y.o. female with medical history significant of chronic back pain, DM, HTN, prior CVA, and HLD presenting with emesis.  She has a h/o prior CVAs and these have historically presented with n/v.  She ate a lot of shrimp (no known allergy) and awoke yesterday AM with n/v.  She also noticed some dysphagia, dysarthria, tongue swelling, and eventual left-sided weakness (facial droop as well as LUE/LLE).   Her symptoms have progressed some while in the ER.  The patient appears to be more concerned about possible allergic reaction as the cause for her difficulty swallowing. AFter initial assessment MRI showed multiple acute infarcts involving left lateral medulla, right pontomedullary junction and left cerebellum.      SLP Plan         Recommendations for follow up therapy are one component of a multi-disciplinary discharge planning process, led by the attending physician.  Recommendations may be updated based on patient status, additional functional criteria and insurance authorization.    Recommendations  Diet recommendations:  (sips of water, bute of puree) Liquids provided via: Cup;Straw Medication Administration: Crushed with puree                Oral Care Recommendations: Oral care QID;Oral care prior to ice chip/H20;Staff/trained caregiver to  provide oral care Follow Up Recommendations: Other (comment) Assistance recommended at discharge: Frequent or constant Supervision/Assistance SLP Visit Diagnosis: Dysphagia, unspecified (R13.10);Dysarthria and anarthria (R47.1);Attention and concentration deficit;Cognitive communication deficit (R41.841) Attention and concentration deficit following: Cerebral infarction           Wladyslaw Henrichs, Katherene Ponto  02/28/2022, 12:05 PM

## 2022-02-28 NOTE — Evaluation (Signed)
Occupational Therapy Evaluation Patient Details Name: Michelle Brooks MRN: [id number] DOB: 1965/08/08 Today's Date: 02/28/2022   History of Present Illness Pt is a 57 y.o. female with nausea, vomiting, difficult swallowing. MRI revealed small acute infarcts in the left lateral medulla, right pontine  medullary junction, and left cerebellum. 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   Clinical Impression   PTA, pt performing ADL and IADL independently. Upon eval, pt with fatigue from other testing this date. Pt presents with facial droop, generalized weakness, decreased balance, and intermittent dizziness. Pt performing UB ADL with set-up and LB ADL with min A. Pt performing STS transfer with min A +2 on eval and observed with dizziness and slight sway in standing. Pt deferring further mobility due to being fatigued from the day. Agreeable to visual testing; observed with mild nystagmus during horizontal tracking. L eye blurriness at all times and reporting recently referred to an ophthalmologist. Will continue to follow acutely.      Recommendations for follow up therapy are one component of a multi-disciplinary discharge planning process, led by the attending physician.  Recommendations may be updated based on patient status, additional functional criteria and insurance authorization.   Follow Up Recommendations  Skilled nursing-short term rehab (<3 hours/day)     Assistance Recommended at Discharge Intermittent Supervision/Assistance  Patient can return home with the following A little help with walking and/or transfers;A little help with bathing/dressing/bathroom;Assistance with cooking/housework;Assist for transportation;Help with stairs or ramp for entrance    Functional Status Assessment  Patient has had a recent decline in their functional status and demonstrates the ability to make significant improvements in function  in a reasonable and predictable amount of time.  Equipment Recommendations  None recommended by OT    Recommendations for Other Services       Precautions / Restrictions Precautions Precautions: Fall Restrictions Weight Bearing Restrictions: No      Mobility Bed Mobility Overal bed mobility: Needs Assistance Bed Mobility: Sidelying to Sit, Supine to Sit   Sidelying to sit: Supervision Supine to sit: Supervision     General bed mobility comments: A little extra time, supervision for stand by assistance    Transfers Overall transfer level: Needs assistance Equipment used: 2 person hand held assist Transfers: Sit to/from Stand Sit to Stand: Min assist, +2 physical assistance           General transfer comment: Slightly unsteady on standing with 2 person hand held assist.      Balance Overall balance assessment: Needs assistance Sitting-balance support: Bilateral upper extremity supported Sitting balance-Leahy Scale: Good Sitting balance - Comments: Used bil UE to support trunk while sitting.   Standing balance support: Bilateral upper extremity supported Standing balance-Leahy Scale: Fair Standing balance comment: multi directional sway Min A to stabilize on standing                           ADL either performed or assessed with clinical judgement   ADL Overall ADL's : Needs assistance/impaired Eating/Feeding: Modified independent   Grooming: Set up;Sitting   Upper Body Bathing: Set up;Sitting       Upper Body Dressing : Set up;Sitting       Toilet Transfer: Minimal assistance;+2 for physical assistance;Stand-pivot Toilet Transfer Details (indicate cue type and reason): Min A at this time         Functional mobility during ADLs: Minimal assistance;+2 for physical assistance (  basic transfer and pt deferring further mobility)       Vision Ability to See in Adequate Light: 1 Impaired Patient Visual Report: Other (comment) (recently  referred to an opthalmologist for L eye clouding) Vision Assessment?: Vision impaired- to be further tested in functional context;Yes Tracking/Visual Pursuits: Decreased smoothness of horizontal tracking (min nystagmus) Saccades: Decreased speed of saccadic movement Convergence: Within functional limits Additional Comments: Pt reporting that "details" such as therapist's face are difficult to see. Blurring of vision is always present when L eye is open, reporting it looks like "squiggles". No blurriness with L eye closed.     Perception     Praxis      Pertinent Vitals/Pain Pain Assessment Pain Assessment: No/denies pain     Hand Dominance Right   Extremity/Trunk Assessment Upper Extremity Assessment Upper Extremity Assessment: Generalized weakness   Lower Extremity Assessment Lower Extremity Assessment: Defer to PT evaluation   Cervical / Trunk Assessment Cervical / Trunk Assessment: Normal   Communication Communication Communication: No difficulties   Cognition Arousal/Alertness: Awake/alert Behavior During Therapy: WFL for tasks assessed/performed Overall Cognitive Status: Within Functional Limits for tasks assessed                                       General Comments  Pt was fatigued from earlier testing and declined further out of bed activities    Exercises     Shoulder Instructions      Home Living Family/patient expects to be discharged to:: Private residence Living Arrangements: Other (Comment) (boyfriend and nephew) Available Help at Discharge: Family;Available PRN/intermittently Type of Home: Apartment Home Access: Stairs to enter Entrance Stairs-Number of Steps: 15 Entrance Stairs-Rails: Left Home Layout: One level     Bathroom Shower/Tub: Chief Strategy Officer: Handicapped height Bathroom Accessibility: No   Home Equipment: Agricultural consultant (2 wheels);Shower seat;Rollator (4 wheels);Gilmer Mor - single point      Lives  With: Family    Prior Functioning/Environment Prior Level of Function : Independent/Modified Independent                        OT Problem List: Decreased strength;Impaired balance (sitting and/or standing);Decreased activity tolerance;Impaired vision/perception;Decreased safety awareness      OT Treatment/Interventions: Self-care/ADL training;Therapeutic exercise;DME and/or AE instruction;Balance training;Patient/family education;Therapeutic activities;Visual/perceptual remediation/compensation    OT Goals(Current goals can be found in the care plan section) Acute Rehab OT Goals Patient Stated Goal: eat OT Goal Formulation: With patient Time For Goal Achievement: 03/14/22 Potential to Achieve Goals: Good  OT Frequency: Min 2X/week    Co-evaluation   Reason for Co-Treatment: Other (comment) (pt very fatigued; gone all morning for testing. blocking care to preserve pt energy)          AM-PAC OT "6 Clicks" Daily Activity     Outcome Measure Help from another person eating meals?: None Help from another person taking care of personal grooming?: A Little Help from another person toileting, which includes using toliet, bedpan, or urinal?: A Little Help from another person bathing (including washing, rinsing, drying)?: A Little Help from another person to put on and taking off regular upper body clothing?: A Little Help from another person to put on and taking off regular lower body clothing?: A Little 6 Click Score: 19   End of Session Nurse Communication: Mobility status;Other (comment) (Pt would like to eat but is unsure  what she can order as she is on a liquid diet)  Activity Tolerance: Patient tolerated treatment well Patient left: in bed;with call bell/phone within reach;with bed alarm set  OT Visit Diagnosis: Unsteadiness on feet (R26.81);Muscle weakness (generalized) (M62.81);Other abnormalities of gait and mobility (R26.89);Dizziness and giddiness (R42);Low  vision, both eyes (H54.2)                Time: 9485-4627 OT Time Calculation (min): 27 min Charges:  OT General Charges $OT Visit: 1 Visit OT Evaluation $OT Eval Moderate Complexity: 1 Mod  Elder Cyphers, OTR/L [facility] Acute Rehabilitation Office: [phone]   Magnus Ivan 02/28/2022, 4:54 PM

## 2022-02-28 NOTE — Progress Notes (Signed)
STROKE TEAM PROGRESS NOTE   INTERVAL HISTORY Her husband is at the bedside.   Patient seems to be doing better today.  Speech therapy plan to take her down for modified barium swallow to see if her swallowing is improved.  Vital signs stable.  No new complaints. Vitals:   02/28/22 0214 02/28/22 0854 02/28/22 0919 02/28/22 1246  BP: (!) 155/75 (!) 197/94 (!) 173/82 (!) 195/89  Pulse: 88 73 75 77  Resp: 18 18  18   Temp: 98.8 F (37.1 C) 98.5 F (36.9 C)  98.3 F (36.8 C)  TempSrc: Oral Oral  Oral  SpO2: 95% 100% 95% 98%  Weight:      Height:       CBC:  Recent Labs  Lab 02/26/22 1220  WBC 10.4  HGB 14.8  HCT 47.0*  MCV 89.7  PLT 970   Basic Metabolic Panel:  Recent Labs  Lab 02/26/22 1220  NA 139  K 3.8  CL 106  CO2 19*  GLUCOSE 164*  BUN 14  CREATININE 1.09*  CALCIUM 9.4   Lipid Panel:  Recent Labs  Lab 02/27/22 1025  CHOL 174  TRIG 98  HDL 36*  CHOLHDL 4.8  VLDL 20  LDLCALC 118*   HgbA1c:  Recent Labs  Lab 02/27/22 1025  HGBA1C 8.7*   Urine Drug Screen:  Recent Labs  Lab 02/27/22 0552  LABOPIA NONE DETECTED  COCAINSCRNUR NONE DETECTED  LABBENZ NONE DETECTED  AMPHETMU NONE DETECTED  THCU NONE DETECTED  LABBARB NONE DETECTED    Alcohol Level No results for input(s): "ETH" in the last 168 hours.  IMAGING past 24 hours No results found.  PHYSICAL EXAM  Physical Exam  Constitutional: Mildly obese middle-aged African-American lady Cardiovascular: Normal rate and regular rhythm.  Respiratory: Effort normal, non-labored breathing  Neuro: Mental Status: Patient is awake, alert, oriented to person, place, month, year, and situation. Mild dysarthria No signs of aphasia or neglect Cranial Nerves: II: Visual Fields are full. Pupils are equal, round, and reactive to light.   III,IV, VI: gaze is dysconjugate, left eye lid closed.  Nystagmus to the left V: Facial sensation is symmetric  VII: Left facial droop VIII: Hearing is intact to  voice X: Palate elevates symmetrically XI: Shoulder shrug is symmetric. XII: Tongue protrudes midline without atrophy or fasciculations.  Motor: Tone is normal. Bulk is normal. No drift in any extremities  Left hemiparesis 3/5 with left hand weakness mild left leg drift. Sensory: Sensation is symmetric to light touch and temperature in the arms and legs.  Cerebellar: Ataxia on with FNF on the left    ASSESSMENT/PLAN [name] is a 57 y.o. female with history of DM2 with retinopathy, HTN, HLD, GERD, who presents with nausea/vomiting, difficulty swallowing, left eyelid droop, numbness on the right and paresthesias since tuesday evening. She does have a loop recorder that was placed 08/2018. No afib events as of 12/2021.     Stroke:  left medullary, right pontine and left cerebellar acute strokes.  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 CTA head & neck progression of occlusive intracranial disease, suggestive of moyamoya type process. apparent non-opacification of the left vertebral artery proximally and poor opacification right vertebral artery, suspicious for interval proximal occlusion and/or stenosis. Distal reconstitution bilaterally. Intracranially, posterior cerebral arteries are largely supplied by posterior communicating  arteries.  MRI  left medullary, right pontine and left cerebellar stroke 2D Echo 60-65% LDL 118 HgbA1c 8.7 VTE prophylaxis - lovenox     Diet   Diet NPO time specified   aspirin 81 mg daily prior to admission, now on aspirin 81 mg daily and clopidogrel 75 mg daily. x 3 months and then Plavix alone Therapy recommendations:  pending Disposition:  pending   Hypertension Stable Permissive hypertension (OK if < 220/120) but gradually normalize in 5-7 days Long-term BP goal  normotensive  Hyperlipidemia Home meds:  pravastatin 80mg , resumed in hospital LDL 118, goal < 70 Continue statin at discharge  Diabetes type II Uncontrolled Home meds:  dulaglutide, januvia, insulin HgbA1c 8.7, goal < 7.0 CBGs Recent Labs    02/28/22 0608 02/28/22 0848 02/28/22 1244  GLUCAP 177* 145* 145*    SSI  Other Stroke Risk Factors Former cigarette smoker - quit Obesity, Body mass index is 40.12 kg/m., recommend weight loss, diet and exercise as appropriate  Hx stroke/TIA Daughter states first stroke was in 2011 08/2018- acute right cerebellar infarct  08/2014- Acute subcentimeter infarct of the RIGHT lateral medulla and RIGHT inferior cerebellar peduncle, within the distribution of the RIGHT posterior-inferior cerebellar artery.  Other Active Problems Latex allergy Cre - 1.29  Hospital day # 1  Patient presented with dysarthria and left-sided weakness and MRI scan shows multifocal posterior circulation infarcts.  She had multiple prior strokes and CT angiogram shows significantly progress intracranial atherosclerotic changes with near more Marbert appearance.  Patient also has uncontrolled risk factors.  Recommend dual antiplatelet therapy aspirin and Plavix for 3 months followed by Plavix alone and aggressive risk factor modification.  Physical therapy Occupational Therapy and speech therapy consults.  She will likely need inpatient rehab.  Speech therapy to check swallow eval today.  Greater than 50% time during this 35-minute visit was spent in counseling and coordination of care about her strokes discussion about evaluation and treatment.  Long discussion with daughter at the bedside and answered questions.  Antony Contras, MD Medical Director [facility] Pager: [phone] 02/28/2022 2:44 PM   To contact Stroke Continuity provider, please refer to [email or web address] After hours, contact General Neurology

## 2022-02-28 NOTE — Evaluation (Signed)
Physical Therapy Evaluation Patient Details Name: Michelle Brooks MRN: [id number] DOB: Mar 21, 1965 Today's Date: 02/28/2022  History of Present Illness  Pt is a 57 y.o. female with nausea, vomiting, difficult swallowing. MRI revealed small acute infarcts in the left lateral medulla, right pontine  medullary junction, and left cerebellum. 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  Clinical Impression  Pt is currently presenting below baseline. Previously pt was modified independent with all activities. Currently pt is Min A +2 for sit to stand and was unable to progress gait today due to fatigue. Pt has 15 steps to get into her home and currently at this time demonstrates impaired endurance/strength to navigate stairs per home set up.  At this time recommending skilled physical therapy services in SNF setting on discharge from acute care hospital setting in order to decrease risk for falls, injury and re-hospitalization. Pt demonstrates no signs/symptoms of cardiac/respiratory distress throughout session.      Recommendations for follow up therapy are one component of a multi-disciplinary discharge planning process, led by the attending physician.  Recommendations may be updated based on patient status, additional functional criteria and insurance authorization.  Follow Up Recommendations Skilled nursing-short term rehab (<3 hours/day) Can patient physically be transported by private vehicle: Yes    Assistance Recommended at Discharge Intermittent Supervision/Assistance  Patient can return home with the following  A little help with walking and/or transfers;Assist for transportation;Help with stairs or ramp for entrance;Assistance with cooking/housework    Equipment Recommendations Other (comment) (defer to post acute. Pt has needed equipment)  Recommendations for Other Services       Functional Status Assessment Patient has  had a recent decline in their functional status and demonstrates the ability to make significant improvements in function in a reasonable and predictable amount of time.     Precautions / Restrictions Precautions Precautions: Fall Restrictions Weight Bearing Restrictions: No      Mobility  Bed Mobility Overal bed mobility: Needs Assistance Bed Mobility: Sidelying to Sit, Supine to Sit   Sidelying to sit: Supervision Supine to sit: Supervision     General bed mobility comments: A little extra time, supervision for stand by assistance Patient Response: Cooperative  Transfers Overall transfer level: Needs assistance Equipment used: 2 person hand held assist Transfers: Sit to/from Stand Sit to Stand: Min assist, +2 physical assistance           General transfer comment: Slightly unsteady on standing with 2 person hand held assist.    Ambulation/Gait   General Gait Details: Pt declined further out of bed activities due to fatigue.  Stairs Stairs:  (Pt declined further OOB activities due to fatigue)          Wheelchair Mobility    Modified Rankin (Stroke Patients Only) Modified Rankin (Stroke Patients Only) Pre-Morbid Rankin Score: No significant disability Modified Rankin: Moderate disability     Balance Overall balance assessment: Needs assistance Sitting-balance support: Bilateral upper extremity supported Sitting balance-Leahy Scale: Good Sitting balance - Comments: Used bil UE to support trunk while sitting.   Standing balance support: Bilateral upper extremity supported Standing balance-Leahy Scale: Fair Standing balance comment: multi directional sway Min A to stabilize on standing           Pertinent Vitals/Pain Pain Assessment Pain Assessment: No/denies pain    Home Living Family/patient expects to be discharged to:: Private residence Living Arrangements: Other (Comment) (boyfriend and nephew) Available Help at Discharge: Family;Available  PRN/intermittently Type of Home: Apartment Home Access: Stairs to enter Entrance Stairs-Rails: Left Entrance Stairs-Number of Steps: 15   Home Layout: One level Home Equipment: Conservation officer, nature (2 wheels);Shower seat;Rollator (4 wheels);Cane - single point      Prior Function Prior Level of Function : Independent/Modified Independent         Hand Dominance   Dominant Hand: Right    Extremity/Trunk Assessment   Upper Extremity Assessment Upper Extremity Assessment: Defer to OT evaluation    Lower Extremity Assessment Lower Extremity Assessment: Overall WFL for tasks assessed (5/5 knee extension, DF, 4/5 hip abd/add strength bil)    Cervical / Trunk Assessment Cervical / Trunk Assessment: Normal  Communication   Communication: No difficulties  Cognition Arousal/Alertness: Awake/alert Behavior During Therapy: WFL for tasks assessed/performed Overall Cognitive Status: Within Functional Limits for tasks assessed          General Comments General comments (skin integrity, edema, etc.): Pt was fatigued from earlier testing and declined further out of bed activities        Assessment/Plan    PT Assessment Patient needs continued PT services  PT Problem List Decreased activity tolerance;Decreased mobility;Decreased balance       PT Treatment Interventions DME instruction;Therapeutic exercise;Gait training;Balance training;Manual techniques;Neuromuscular re-education;Stair training;Functional mobility training;Therapeutic activities;Patient/family education    PT Goals (Current goals can be found in the Care Plan section)  Acute Rehab PT Goals Patient Stated Goal: To get stronger and feel better PT Goal Formulation: With patient Time For Goal Achievement: 03/14/22 Potential to Achieve Goals: Good    Frequency Min 3X/week     Co-evaluation PT/OT/SLP Co-Evaluation/Treatment: Yes Reason for Co-Treatment: Other (comment) (pt very fatigued; gone all morning for  testing. blocking care to preserve pt energy)           AM-PAC PT "6 Clicks" Mobility  Outcome Measure Help needed turning from your back to your side while in a flat bed without using bedrails?: None Help needed moving from lying on your back to sitting on the side of a flat bed without using bedrails?: A Little Help needed moving to and from a bed to a chair (including a wheelchair)?: A Little Help needed standing up from a chair using your arms (e.g., wheelchair or bedside chair)?: A Little Help needed to walk in hospital room?: A Little Help needed climbing 3-5 steps with a railing? : A Lot 6 Click Score: 18    End of Session   Activity Tolerance: Patient tolerated treatment well;Patient limited by fatigue Patient left: in bed;with call bell/phone within reach;with bed alarm set;Other (comment) (with OT in room) Nurse Communication: Mobility status;Other (comment) (pt was hungry and wanted tray sent up to room) PT Visit Diagnosis: Unsteadiness on feet (R26.81);Other abnormalities of gait and mobility (R26.89)    Time: 5102-5852 PT Time Calculation (min) (ACUTE ONLY): 17 min   Charges:   PT Evaluation $PT Eval Low Complexity: [place], DPT, CLT  Acute Rehabilitation Services Office: [phone] (Secure chat preferred)   Ander Purpura 02/28/2022, 4:15 PM

## 2022-02-28 NOTE — Progress Notes (Signed)
[facility] PROGRESS NOTE   Michelle Brooks GYI:[id number] DOB: 1965-03-25 DOA: 02/26/2022  PCP: Fleet Contras, MD  Brief History/Interval Summary:  56 y.o. female with medical history significant of chronic back pain, DM, HTN, prior CVA, and HLD presenting with emesis.  She has a h/o prior CVAs and these have historically presented with n/v.  She ate a lot of shrimp (no known allergy) and awoke with n/v.  She also noticed some dysphagia, dysarthria, tongue swelling, and eventual left-sided weakness (facial droop as well as LUE/LLE).   She was hospitalized due to concern for acute stroke.    Consultants: Neurology  Procedures: Echocardiogram    Subjective/Interval History: Patient mentions that her facial swelling has improved.  Tongue swelling has improved.  Left-sided weakness persists though better compared to yesterday.    Assessment/Plan:  Acute stroke MRI confirms stroke. Neurology has been consulted. Patient to be on aspirin and Plavix for 3 months followed by Plavix alone. PT OT and speech therapy to evaluate. Patient remains NPO. LDL is 118.  Patient was on pravastatin prior to admission.  Changed over to atorvastatin. HbA1c 8.7. Echocardiogram shows normal systolic function with grade 1 diastolic dysfunction.  Questionable allergic reaction Patient had evidence for angioedema at the time of admission.  Likely associated with shrimp intake recently. She was given Benadryl and Pepcid with improvement. Seems to be protecting her airway.  Waiting on speech therapy to see for swallowing function.  Essential hypertension Allowing permissive hypertension ARB on hold.  Diabetes mellitus type 2, uncontrolled with hyperglycemia HbA1c 8.7.  Her home medicines are currently on hold.  On SSI and glargine currently.  Obesity Estimated body mass index is 38.01 kg/m as calculated from the following:   Height as of this encounter: 5\' 8"  (1.727 m).   Weight as of this  encounter: 113.4 kg.  DVT Prophylaxis: Lovenox Code Status: Full code Family Communication: Discussed patient and her husband Disposition Plan: To be determined  Status is: Inpatient Remains inpatient appropriate because: Acute stroke      Medications: Scheduled:  aspirin EC  81 mg Oral Daily   aspirin  300 mg Rectal Daily   atorvastatin  80 mg Oral Daily   clopidogrel  75 mg Oral Daily   enoxaparin (LOVENOX) injection  50 mg Subcutaneous Q24H   insulin aspart  0-15 Units Subcutaneous TID WC   insulin aspart  0-5 Units Subcutaneous QHS   Continuous:  sodium chloride 1,000 mL (02/27/22 2212)   famotidine (PEPCID) IV Stopped (02/27/22 2234)   2235 **OR** acetaminophen (TYLENOL) oral liquid 160 mg/5 mL **OR** acetaminophen, diphenhydrAMINE, ondansetron (ZOFRAN) IV, senna-docusate  Antibiotics: Anti-infectives (From admission, onward)    None       Objective:  Vital Signs  Vitals:   02/28/22 0130 02/28/22 0214 02/28/22 0854 02/28/22 0919  BP: (!) 140/88 (!) 155/75 (!) 197/94 (!) 173/82  Pulse: 83 88 73 75  Resp: (!) 27 18 18    Temp:  98.8 F (37.1 C) 98.5 F (36.9 C)   TempSrc:  Oral Oral   SpO2: 97% 95% 100% 95%  Weight:      Height:        Intake/Output Summary (Last 24 hours) at 02/28/2022 1012 Last data filed at 02/28/2022 0600 Gross per 24 hour  Intake 1048.02 ml  Output 200 ml  Net 848.02 ml   Filed Weights   02/26/22 1157  Weight: 113.4 kg    General appearance: Awake alert.  In no distress Resp: Clear  to auscultation bilaterally.  Normal effort Cardio: S1-S2 is normal regular.  No S3-S4.  No rubs murmurs or bruit GI: Abdomen is soft.  Nontender nondistended.  Bowel sounds are present normal.  No masses organomegaly Extremities: No edema.   Neurologic: Alert and oriented x3.  Subtle left-sided weakness noted   Lab Results:  Data Reviewed: I have personally reviewed following labs and reports of the imaging  studies  CBC: Recent Labs  Lab 02/26/22 1220  WBC 10.4  HGB 14.8  HCT 47.0*  MCV 89.7  PLT 288    Basic Metabolic Panel: Recent Labs  Lab 02/26/22 1220  NA 139  K 3.8  CL 106  CO2 19*  GLUCOSE 164*  BUN 14  CREATININE 1.09*  CALCIUM 9.4    GFR: Estimated Creatinine Clearance: 76.1 mL/min (A) (by C-G formula based on SCr of 1.09 mg/dL (H)).  Liver Function Tests: Recent Labs  Lab 02/26/22 1220  AST 18  ALT 18  ALKPHOS 93  BILITOT 0.2*  PROT 7.2  ALBUMIN 3.8    Recent Labs  Lab 02/26/22 1220  LIPASE 40    Coagulation Profile: Recent Labs  Lab 02/27/22 0134  INR 1.0    HbA1C: Recent Labs    02/27/22 1025  HGBA1C 8.7*    CBG: Recent Labs  Lab 02/27/22 1246 02/27/22 1825 02/27/22 2207 02/28/22 0608 02/28/22 0848  GLUCAP 214* 155* 148* 177* 145*    Lipid Profile: Recent Labs    02/27/22 1025  CHOL 174  HDL 36*  LDLCALC 118*  TRIG 98  CHOLHDL 4.8    Thyroid Function Tests: Recent Labs    02/27/22 1025  TSH 0.499     Radiology Studies: ECHOCARDIOGRAM COMPLETE BUBBLE STUDY  Result Date: 02/27/2022    ECHOCARDIOGRAM REPORT   Patient Name:   Michelle Brooks Date of Exam: 02/27/2022 Medical Rec #:  [id number]      Height:       68.0 in Accession #:    [id number]     Weight:       250.0 lb Date of Birth:  [birth year]       BSA:          2.247 m Patient Age:    56 years       BP:           184/86 mmHg Patient Gender: F              HR:           80 bpm. Exam Location:  Inpatient Procedure: 2D Echo, Color Doppler, Cardiac Doppler and Saline Contrast Bubble            Study Indications:    Stroke i63.9;  History:        Patient has prior history of Echocardiogram examinations, most                 recent 08/30/2018. Risk Factors:Hypertension, Diabetes and                 Dyslipidemia.  Sonographer:    Irving Burton Senior RDCS Referring Phys: [id number] [facility] IMPRESSIONS  1. Left ventricular ejection fraction, by estimation, is 60 to 65%. The  left ventricle has normal function. The left ventricle has no regional wall motion abnormalities. There is mild concentric left ventricular hypertrophy. Left ventricular diastolic parameters are consistent with Grade I diastolic dysfunction (impaired relaxation).  2. Right ventricular systolic function is normal. The right ventricular size is normal.  Tricuspid regurgitation signal is inadequate for assessing PA pressure.  3. No evidence of mitral valve regurgitation.  4. The aortic valve is tricuspid. Aortic valve regurgitation is not visualized.  5. The inferior vena cava is normal in size with greater than 50% respiratory variability, suggesting right atrial pressure of 3 mmHg.  6. Agitated saline contrast bubble study was negative, with no evidence of any interatrial shunt. Comparison(s): No significant change from prior study. Conclusion(s)/Recommendation(s): Prior TEE in 2020 showed possible very small PFO or AVM. No significant PFO seen on TTE. With recurrent stroke, recommend repeat TEE with more bicaval views (3D imaging) to determine if has PFO amenable for closure. Can also r/o veg related emboli. ILR did not show afib. FINDINGS  Left Ventricle: Left ventricular ejection fraction, by estimation, is 60 to 65%. The left ventricle has normal function. The left ventricle has no regional wall motion abnormalities. The left ventricular internal cavity size was normal in size. There is  mild concentric left ventricular hypertrophy. Left ventricular diastolic parameters are consistent with Grade I diastolic dysfunction (impaired relaxation). Right Ventricle: The right ventricular size is normal. Right ventricular systolic function is normal. Tricuspid regurgitation signal is inadequate for assessing PA pressure. Left Atrium: Left atrial size was normal in size. Right Atrium: Right atrial size was normal in size. Pericardium: There is no evidence of pericardial effusion. Mitral Valve: No evidence of mitral valve  regurgitation. Tricuspid Valve: Tricuspid valve regurgitation is not demonstrated. Aortic Valve: The aortic valve is tricuspid. Aortic valve regurgitation is not visualized. Pulmonic Valve: Pulmonic valve regurgitation is not visualized. Aorta: The aortic root and ascending aorta are structurally normal, with no evidence of dilitation. Venous: The inferior vena cava is normal in size with greater than 50% respiratory variability, suggesting right atrial pressure of 3 mmHg. IAS/Shunts: Agitated saline contrast was given intravenously to evaluate for intracardiac shunting. Agitated saline contrast bubble study was negative, with no evidence of any interatrial shunt.  LEFT VENTRICLE PLAX 2D LVIDd:         4.10 cm   Diastology LVIDs:         2.90 cm   LV e' medial:    6.64 cm/s LV PW:         1.70 cm   LV E/e' medial:  12.1 LV IVS:        1.10 cm   LV e' lateral:   8.38 cm/s LVOT diam:     2.20 cm   LV E/e' lateral: 9.6 LV SV:         68 LV SV Index:   30 LVOT Area:     3.80 cm  RIGHT VENTRICLE RV S prime:     14.80 cm/s TAPSE (M-mode): 2.2 cm LEFT ATRIUM             Index        RIGHT ATRIUM           Index LA diam:        3.30 cm 1.47 cm/m   RA Area:     13.90 cm LA Vol (A2C):   46.1 ml 20.52 ml/m  RA Volume:   30.90 ml  13.75 ml/m LA Vol (A4C):   52.2 ml 23.23 ml/m LA Biplane Vol: 49.7 ml 22.12 ml/m  AORTIC VALVE LVOT Vmax:   93.30 cm/s LVOT Vmean:  57.100 cm/s LVOT VTI:    0.178 m  AORTA Ao Root diam: 3.50 cm Ao Asc diam:  3.40 cm MITRAL VALVE MV Area (  PHT): 2.81 cm     SHUNTS MV Decel Time: 270 msec     Systemic VTI:  0.18 m MV E velocity: 80.10 cm/s   Systemic Diam: 2.20 cm MV A velocity: 109.00 cm/s MV E/A ratio:  0.73 Phineas Inches Electronically signed by Phineas Inches Signature Date/Time: 02/27/2022/11:44:43 AM    Final    CT ANGIO HEAD NECK W WO CM  Result Date: 02/27/2022 CLINICAL DATA:  Neuro deficit, acute, stroke suspected EXAM: CT ANGIOGRAPHY HEAD AND NECK TECHNIQUE: Multidetector CT imaging of  the head and neck was performed using the standard protocol during bolus administration of intravenous contrast. Multiplanar CT image reconstructions and MIPs were obtained to evaluate the vascular anatomy. Carotid stenosis measurements (when applicable) are obtained utilizing NASCET criteria, using the distal internal carotid diameter as the denominator. RADIATION DOSE REDUCTION: This exam was performed according to the departmental dose-optimization program which includes automated exposure control, adjustment of the mA and/or kV according to patient size and/or use of iterative reconstruction technique. CONTRAST:  49mL OMNIPAQUE IOHEXOL 350 MG/ML SOLN COMPARISON:  MRI head from the same day. CTA head/neck from 08/28/2018. FINDINGS: CTA NECK FINDINGS Aortic arch: Great vessel origins are patent without significant stenosis. Right carotid system: No evidence of dissection, stenosis (50% or greater), or occlusion. Mild atherosclerosis. Left carotid system: No evidence of dissection, stenosis (50% or greater), or occlusion. Mild atherosclerosis. Vertebral arteries: Evaluation of the proximal vertebral arteries limited by streak artifact. Non opacification of the left vertebral artery proximally and poor opacification of the right vertebral artery, suspicious for interval proximal occlusion and/or stenosis. Distal reconstitution bilaterally. Skeleton: Multilevel degenerative change. Other neck: Thyroid goiter with multiple nodules, as previously characterized. Upper chest: The visualized lung apices demonstrate areas of emphysema and fibrosis/scarring. 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 Moderate right cavernous/paraclinoid ICA stenosis. Bilaterally there is occlusive or near occlusive stenosis of the M1 MCAs, likely mildly progressed from the prior. There are collaterals  bilaterally. The left A1 ACA is diminutive. The right A1 ACA more distal ACAs are patent. Posterior circulation: Small vertebrobasilar system. The left intradural vertebral artery is stenotic but remains patent. The right intradural vertebral artery terminates as PICA, anatomic variant. Irregular opacification of the basilar artery, similar. The posterior communicating arteries largely supply the posterior cerebral areas which remain patent. Venous sinuses: As permitted by contrast timing, patent. Anatomic variants: Detailed above. Review of the MIP images confirms the above findings IMPRESSION: 1. In comparison to 2020 CTA, some progression of occlusive intracranial disease, suggestive of moyamoya type process and described above. 2. Evaluation of the proximal vertebral arteries limited by streak artifact with apparent non-opacification of the left vertebral artery proximally and poor opacification right vertebral artery, suspicious for interval proximal occlusion and/or stenosis. Distal reconstitution bilaterally. Intracranially, posterior cerebral arteries are largely supplied by posterior communicating arteries. Electronically Signed   By: Margaretha Sheffield M.D.   On: 02/27/2022 08:34   MR BRAIN WO CONTRAST  Result Date: 02/27/2022 CLINICAL DATA:  Stroke follow-up, evaluate new hypodensity by CT. EXAM: MRI HEAD WITHOUT CONTRAST TECHNIQUE: Multiplanar, multiecho pulse sequences of the brain and surrounding structures were obtained without intravenous contrast. COMPARISON:  11/19/2020 FINDINGS: Brain: Small acute infarcts seen in the low right pontine medullary junction, left lateral medulla, and clustered in the left cerebellum. Pre-existing right cerebellar infarction. Small cortical infarcts have occurred along the cerebral convexities, greatest at the high  right frontal and left parietal cortex. Brain volume is normal. No acute hemorrhage, hydrocephalus, or masslike finding Vascular: Grossly preserved flow  voids when compared to prior. Skull and upper cervical spine: Normal marrow signal. Sinuses/Orbits: Negative. IMPRESSION: 1. Small acute infarcts in the left lateral medulla, right pontine medullary junction, and left cerebellum. 2. Remote infarcts in the right cerebellum and bilateral cerebral cortex. Electronically Signed   By: Tiburcio Pea M.D.   On: 02/27/2022 05:18   CT Head Wo Contrast  Result Date: 02/26/2022 CLINICAL DATA:  Transient ischemic attack. Nausea and vomiting. History of strokes. EXAM: CT HEAD WITHOUT CONTRAST TECHNIQUE: Contiguous axial images were obtained from the base of the skull through the vertex without intravenous contrast. RADIATION DOSE REDUCTION: This exam was performed according to the departmental dose-optimization program which includes automated exposure control, adjustment of the mA and/or kV according to patient size and/or use of iterative reconstruction technique. COMPARISON:  11/19/2020. FINDINGS: Brain: No acute intracranial hemorrhage, midline shift or mass effect. No extra-axial fluid collection. Encephalomalacia is present in the frontal lobe on the right, unchanged. There is a old infarct in the right cerebellar hemisphere. Scattered periventricular white matter hypodensities are present bilaterally. No hydrocephalus. A vague hypodense region is noted in the frontal lobe on the left which is new from the prior exam. Vascular: No hyperdense vessel or unexpected calcification. Skull: Normal. Negative for fracture or focal lesion. Sinuses/Orbits: A round density is present in the right maxillary sinus, possible mucosal retention cyst or polyp. No acute orbital abnormality. Other: None. IMPRESSION: 1. Vague hypodensity in the frontal lobe on the left which is new from the previous exam. MRI is suggested for further evaluation. 2. Chronic microvascular ischemic changes with old infarcts in the right cerebellar hemisphere and right frontal lobe. Electronically Signed    By: Thornell Sartorius M.D.   On: 02/26/2022 21:31       LOS: 1 day   Keziah Drotar  [facility] Pager on [email or web address]  02/28/2022, 10:12 AM

## 2022-02-28 NOTE — ED Notes (Signed)
ED TO INPATIENT HANDOFF REPORT  ED Nurse Name and Phone #: Raquel Sarna [phone]  S Name/Age/Gender Maurie Boettcher 57 y.o. female Room/Bed: 039C/039C  Code Status   Code Status: Full Code  Home/SNF/Other Home Patient oriented to: self, place, time, and situation Is this baseline? Yes   Triage Complete: Triage complete  Chief Complaint Acute CVA (cerebrovascular accident) [facility]) [I63.9]  Triage Note Pt came in POV d/t feeling weird on Lt side of face, feeling numb & vomiting after eating shrimp yesterday. No decreased sensation on hands, strength equal, A/Ox4. Pt reports abd pain that continues now that makes her feel sick.   Allergies Allergies  Allergen Reactions   Lactose Intolerance (Gi) Diarrhea and Nausea And Vomiting   Latex     Like a burning in area    Level of Care/Admitting Diagnosis ED Disposition     ED Disposition  Admit   Condition  --   Comment  Hospital Area: [facility] [[id number]]  Level of Care: Telemetry Medical [[id number]]  May admit patient to [facility] or [facility] if equivalent level of care is available:: No  Covid Evaluation: Asymptomatic - no recent exposure (last 10 days) testing not required  Diagnosis: Acute CVA (cerebrovascular accident) [facility]) [[id number]]  Admitting Physician: Jonah Blue [[id number]]  Attending Physician: Jonah Blue [[id number]]  Certification:: I certify this patient will need inpatient services for at least 2 midnights  Estimated Length of Stay: 3          B Medical/Surgery History Past Medical History:  Diagnosis Date   Chronic back pain    Diabetes mellitus type 2 in obese (HCC)    Diabetic retinopathy (HCC)    GERD (gastroesophageal reflux disease)    Headache    History of stroke 03/2010   Right MCA stroke in 03/2010, with MRI also noting subacute strokes in left fronto parietal area - occured in [place] received care at [facility] thought due to uncontrolled blood pressure // No residual deficits  // TTE (03/2010) at [facility] - normal LV systolic function, moderate pericardial effusion with 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   HSV-2 infection    Hyperlipidemia LDL goal < 100 04/01/2011   Hypertension    Lumbago    pain in the back   Past Surgical History:  Procedure Laterality Date   BREAST LUMPECTOMY  2002   states benign lymph node removed.   BUBBLE STUDY  08/30/2018   Procedure: BUBBLE STUDY;  Surgeon: Vesta Mixer, MD;  Location: [facility] ENDOSCOPY;  Service: Cardiovascular;;   ENDOMETRIAL BIOPSY  10/04/2020   LOOP RECORDER INSERTION N/A 08/30/2018   Procedure: LOOP RECORDER INSERTION;  Surgeon: Marinus Maw, MD;  Location: [facility] INVASIVE CV LAB;  Service: Cardiovascular;  Laterality: N/A;   TEE WITHOUT CARDIOVERSION N/A 08/30/2018   Procedure: TRANSESOPHAGEAL ECHOCARDIOGRAM (TEE);  Surgeon: Elease Hashimoto Deloris Ping, MD;  Location: [facility] ENDOSCOPY;  Service: Cardiovascular;  Laterality: N/A;     A IV Location/Drains/Wounds Patient Lines/Drains/Airways Status     Active Line/Drains/Airways     Name Placement date Placement time Site Days   Peripheral IV 02/27/22 20 G Anterior;Proximal;Right Forearm 02/27/22  0150  Forearm  1   External Urinary Catheter 08/29/18  2230  --  1279            Intake/Output Last 24 hours  Intake/Output Summary (Last 24 hours) at 02/28/2022 0002 Last data filed at 02/27/2022 2234 Gross per  24 hour  Intake 1091.81 ml  Output 200 ml  Net 891.81 ml    Labs/Imaging Results for orders placed or performed during the hospital encounter of 02/26/22 (from the past 48 hour(s))  Lipase, blood     Status: None   Collection Time: 02/26/22 12:20 PM  Result Value Ref Range   Lipase 40 11 - 51 U/L    Comment: Performed at [facility], [place] [address], [place], [place] [address]  Comprehensive metabolic panel     Status: Abnormal   Collection Time: 02/26/22 12:20 PM  Result Value Ref  Range   Sodium 139 135 - 145 mmol/L   Potassium 3.8 3.5 - 5.1 mmol/L   Chloride 106 98 - 111 mmol/L   CO2 19 (L) 22 - 32 mmol/L   Glucose, Bld 164 (H) 70 - 99 mg/dL    Comment: Glucose reference range applies only to samples taken after fasting for at least 8 hours.   BUN 14 6 - 20 mg/dL   Creatinine, Ser 1.09 (H) 0.44 - 1.00 mg/dL   Calcium 9.4 8.9 - 10.3 mg/dL   Total Protein 7.2 6.5 - 8.1 g/dL   Albumin 3.8 3.5 - 5.0 g/dL   AST 18 15 - 41 U/L   ALT 18 0 - 44 U/L   Alkaline Phosphatase 93 38 - 126 U/L   Total Bilirubin 0.2 (L) 0.3 - 1.2 mg/dL   GFR, Estimated 60 (L) >60 mL/min    Comment: (NOTE) Calculated using the CKD-EPI Creatinine Equation (2021)    Anion gap 14 5 - 15    Comment: Performed at [facility] [address], [place], [place] [address]  CBC     Status: Abnormal   Collection Time: 02/26/22 12:20 PM  Result Value Ref Range   WBC 10.4 4.0 - 10.5 K/uL   RBC 5.24 (H) 3.87 - 5.11 MIL/uL   Hemoglobin 14.8 12.0 - 15.0 g/dL   HCT 47.0 (H) 36.0 - 46.0 %   MCV 89.7 80.0 - 100.0 fL   MCH 28.2 26.0 - 34.0 pg   MCHC 31.5 30.0 - 36.0 g/dL   RDW 13.3 11.5 - 15.5 %   Platelets 288 150 - 400 K/uL   nRBC 0.0 0.0 - 0.2 %    Comment: Performed at [facility], [place] [address] [place], [place] [address]  Protime-INR     Status: None   Collection Time: 02/27/22  1:34 AM  Result Value Ref Range   Prothrombin Time 13.1 11.4 - 15.2 seconds   INR 1.0 0.8 - 1.2    Comment: (NOTE) INR goal varies based on device and disease states. Performed at [facility], [place] [address] [place], [place] [address]   APTT     Status: None   Collection Time: 02/27/22  1:34 AM  Result Value Ref Range   aPTT 24 24 - 36 seconds    Comment: Performed at [facility] [address] [place],  [address]  CBG monitoring, ED     Status: Abnormal   Collection Time: 02/27/22  3:12 AM  Result Value Ref Range   Glucose-Capillary 208 (H) 70 - 99 mg/dL    Comment:  Glucose reference range applies only to samples taken after fasting for at least 8 hours.  Urine rapid drug screen (hosp performed)     Status: None   Collection Time: 02/27/22  5:52 AM  Result Value Ref Range  Opiates NONE DETECTED NONE DETECTED   Cocaine NONE DETECTED NONE DETECTED   Benzodiazepines NONE DETECTED NONE DETECTED   Amphetamines NONE DETECTED NONE DETECTED   Tetrahydrocannabinol NONE DETECTED NONE DETECTED   Barbiturates NONE DETECTED NONE DETECTED    Comment: (NOTE) DRUG SCREEN FOR MEDICAL PURPOSES ONLY.  IF CONFIRMATION IS NEEDED FOR ANY PURPOSE, NOTIFY LAB WITHIN 5 DAYS.  LOWEST DETECTABLE LIMITS FOR URINE DRUG SCREEN Drug Class                     Cutoff (ng/mL) Amphetamine and metabolites    1000 Barbiturate and metabolites    200 Benzodiazepine                 200 Opiates and metabolites        300 Cocaine and metabolites        300 THC                            50 Performed at [facility], [place] [address], [place], [place] [address]   Urinalysis, Routine w reflex microscopic Urine, Clean Catch     Status: Abnormal   Collection Time: 02/27/22  5:52 AM  Result Value Ref Range   Color, Urine YELLOW YELLOW   APPearance HAZY (A) CLEAR   Specific Gravity, Urine 1.025 1.005 - 1.030   pH 5.0 5.0 - 8.0   Glucose, UA 50 (A) NEGATIVE mg/dL   Hgb urine dipstick NEGATIVE NEGATIVE   Bilirubin Urine NEGATIVE NEGATIVE   Ketones, ur 5 (A) NEGATIVE mg/dL   Protein, ur 100 (A) NEGATIVE mg/dL   Nitrite NEGATIVE NEGATIVE   Leukocytes,Ua MODERATE (A) NEGATIVE   RBC / HPF 0-5 0 - 5 RBC/hpf   WBC, UA 21-50 0 - 5 WBC/hpf   Bacteria, UA RARE (A) NONE SEEN   Squamous Epithelial / HPF 6-10 0 - 5 /HPF   Mucus PRESENT     Comment: Performed at [facility], [address] [place], [place] [address]  Lipid panel     Status: Abnormal   Collection Time: 02/27/22 10:25 AM  Result Value Ref Range   Cholesterol 174 0 - 200 mg/dL   Triglycerides 98 <150 mg/dL    HDL 36 (L) >40 mg/dL   Total CHOL/HDL Ratio 4.8 RATIO   VLDL 20 0 - 40 mg/dL   LDL Cholesterol 118 (H) 0 - 99 mg/dL    Comment:        Total Cholesterol/HDL:CHD Risk Coronary Heart Disease Risk Table                     Men   Women  1/2 Average Risk   3.4   3.3  Average Risk       5.0   4.4  2 X Average Risk   9.6   7.1  3 X Average Risk  23.4   11.0        Use the calculated Patient Ratio above and the CHD Risk Table to determine the patient's CHD Risk.        ATP III CLASSIFICATION (LDL):  <100     mg/dL   Optimal  100-129  mg/dL   Near or Above                    Optimal  130-159  mg/dL   Borderline  160-189  mg/dL   High  >  190     mg/dL   Very High Performed at [facility], [place] [address], [place], [place] [address]   Hemoglobin A1c     Status: Abnormal   Collection Time: 02/27/22 10:25 AM  Result Value Ref Range   Hgb A1c MFr Bld 8.7 (H) 4.8 - 5.6 %    Comment: (NOTE) Pre diabetes:          5.7%-6.4%  Diabetes:              >6.4%  Glycemic control for   <7.0% adults with diabetes    Mean Plasma Glucose 202.99 mg/dL    Comment: Performed at [facility] [address], [place], [place] [address]  HIV Antibody (routine testing w rflx)     Status: None   Collection Time: 02/27/22 10:25 AM  Result Value Ref Range   HIV Screen 4th Generation wRfx Non Reactive Non Reactive    Comment: Performed at [facility], [place] [address], [place], [place] [address]  TSH     Status: None   Collection Time: 02/27/22 10:25 AM  Result Value Ref Range   TSH 0.499 0.350 - 4.500 uIU/mL    Comment: Performed by a 3rd Generation assay with a functional sensitivity of <=0.01 uIU/mL. Performed at [facility], [place] [address] [place], [place] [address]   CBG monitoring, ED     Status: Abnormal   Collection Time: 02/27/22 12:46 PM  Result Value Ref Range   Glucose-Capillary 214 (H) 70 - 99 mg/dL    Comment: Glucose reference range applies only to samples taken  after fasting for at least 8 hours.  CBG monitoring, ED     Status: Abnormal   Collection Time: 02/27/22  6:25 PM  Result Value Ref Range   Glucose-Capillary 155 (H) 70 - 99 mg/dL    Comment: Glucose reference range applies only to samples taken after fasting for at least 8 hours.  CBG monitoring, ED     Status: Abnormal   Collection Time: 02/27/22 10:07 PM  Result Value Ref Range   Glucose-Capillary 148 (H) 70 - 99 mg/dL    Comment: Glucose reference range applies only to samples taken after fasting for at least 8 hours.   ECHOCARDIOGRAM COMPLETE BUBBLE STUDY  Result Date: 02/27/2022    ECHOCARDIOGRAM REPORT   Patient Name:   KATLIN BORTNER Date of Exam: 02/27/2022 Medical Rec #:  [id number]      Height:       68.0 in Accession #:    [id number]     Weight:       250.0 lb Date of Birth:  [birth year]       BSA:          2.247 m Patient Age:    86 years       BP:           184/86 mmHg Patient Gender: F              HR:           80 bpm. Exam Location:  Inpatient Procedure: 2D Echo, Color Doppler, Cardiac Doppler and Saline Contrast Bubble            Study Indications:    Stroke i63.9;  History:        Patient has prior history of Echocardiogram examinations, most                 recent  08/30/2018. Risk Factors:Hypertension, Diabetes and                 Dyslipidemia.  Sonographer:    Raquel Sarna Senior RDCS Referring Phys: [id number] [place]  1. Left ventricular ejection fraction, by estimation, is 60 to 65%. The left ventricle has normal function. The left ventricle has no regional wall motion abnormalities. There is mild concentric left ventricular hypertrophy. Left ventricular diastolic parameters are consistent with Grade I diastolic dysfunction (impaired relaxation).  2. Right ventricular systolic function is normal. The right ventricular size is normal. Tricuspid regurgitation signal is inadequate for assessing PA pressure.  3. No evidence of mitral valve regurgitation.  4. The aortic valve  is tricuspid. Aortic valve regurgitation is not visualized.  5. The inferior vena cava is normal in size with greater than 50% respiratory variability, suggesting right atrial pressure of 3 mmHg.  6. Agitated saline contrast bubble study was negative, with no evidence of any interatrial shunt. Comparison(s): No significant change from prior study. Conclusion(s)/Recommendation(s): Prior TEE in 2020 showed possible very small PFO or AVM. No significant PFO seen on TTE. With recurrent stroke, recommend repeat TEE with more bicaval views (3D imaging) to determine if has PFO amenable for closure. Can also r/o veg related emboli. ILR did not show afib. FINDINGS  Left Ventricle: Left ventricular ejection fraction, by estimation, is 60 to 65%. The left ventricle has normal function. The left ventricle has no regional wall motion abnormalities. The left ventricular internal cavity size was normal in size. There is  mild concentric left ventricular hypertrophy. Left ventricular diastolic parameters are consistent with Grade I diastolic dysfunction (impaired relaxation). Right Ventricle: The right ventricular size is normal. Right ventricular systolic function is normal. Tricuspid regurgitation signal is inadequate for assessing PA pressure. Left Atrium: Left atrial size was normal in size. Right Atrium: Right atrial size was normal in size. Pericardium: There is no evidence of pericardial effusion. Mitral Valve: No evidence of mitral valve regurgitation. Tricuspid Valve: Tricuspid valve regurgitation is not demonstrated. Aortic Valve: The aortic valve is tricuspid. Aortic valve regurgitation is not visualized. Pulmonic Valve: Pulmonic valve regurgitation is not visualized. Aorta: The aortic root and ascending aorta are structurally normal, with no evidence of dilitation. Venous: The inferior vena cava is normal in size with greater than 50% respiratory variability, suggesting right atrial pressure of 3 mmHg. IAS/Shunts:  Agitated saline contrast was given intravenously to evaluate for intracardiac shunting. Agitated saline contrast bubble study was negative, with no evidence of any interatrial shunt.  LEFT VENTRICLE PLAX 2D LVIDd:         4.10 cm   Diastology LVIDs:         2.90 cm   LV e' medial:    6.64 cm/s LV PW:         1.70 cm   LV E/e' medial:  12.1 LV IVS:        1.10 cm   LV e' lateral:   8.38 cm/s LVOT diam:     2.20 cm   LV E/e' lateral: 9.6 LV SV:         68 LV SV Index:   30 LVOT Area:     3.80 cm  RIGHT VENTRICLE RV S prime:     14.80 cm/s TAPSE (M-mode): 2.2 cm LEFT ATRIUM             Index        RIGHT ATRIUM  Index LA diam:        3.30 cm 1.47 cm/m   RA Area:     13.90 cm LA Vol (A2C):   46.1 ml 20.52 ml/m  RA Volume:   30.90 ml  13.75 ml/m LA Vol (A4C):   52.2 ml 23.23 ml/m LA Biplane Vol: 49.7 ml 22.12 ml/m  AORTIC VALVE LVOT Vmax:   93.30 cm/s LVOT Vmean:  57.100 cm/s LVOT VTI:    0.178 m  AORTA Ao Root diam: 3.50 cm Ao Asc diam:  3.40 cm MITRAL VALVE MV Area (PHT): 2.81 cm     SHUNTS MV Decel Time: 270 msec     Systemic VTI:  0.18 m MV E velocity: 80.10 cm/s   Systemic Diam: 2.20 cm MV A velocity: 109.00 cm/s MV E/A ratio:  0.73 Landscape architect signed by Phineas Inches Signature Date/Time: 02/27/2022/11:44:43 AM    Final    CT ANGIO HEAD NECK W WO CM  Result Date: 02/27/2022 CLINICAL DATA:  Neuro deficit, acute, stroke suspected EXAM: CT ANGIOGRAPHY HEAD AND NECK TECHNIQUE: Multidetector CT imaging of the head and neck was performed using the standard protocol during bolus administration of intravenous contrast. Multiplanar CT image reconstructions and MIPs were obtained to evaluate the vascular anatomy. Carotid stenosis measurements (when applicable) are obtained utilizing NASCET criteria, using the distal internal carotid diameter as the denominator. RADIATION DOSE REDUCTION: This exam was performed according to the departmental dose-optimization program which includes automated  exposure control, adjustment of the mA and/or kV according to patient size and/or use of iterative reconstruction technique. CONTRAST:  42mL OMNIPAQUE IOHEXOL 350 MG/ML SOLN COMPARISON:  MRI head from the same day. CTA head/neck from 08/28/2018. FINDINGS: CTA NECK FINDINGS Aortic arch: Great vessel origins are patent without significant stenosis. Right carotid system: No evidence of dissection, stenosis (50% or greater), or occlusion. Mild atherosclerosis. Left carotid system: No evidence of dissection, stenosis (50% or greater), or occlusion. Mild atherosclerosis. Vertebral arteries: Evaluation of the proximal vertebral arteries limited by streak artifact. Non opacification of the left vertebral artery proximally and poor opacification of the right vertebral artery, suspicious for interval proximal occlusion and/or stenosis. Distal reconstitution bilaterally. Skeleton: Multilevel degenerative change. Other neck: Thyroid goiter with multiple nodules, as previously characterized. Upper chest: The visualized lung apices demonstrate areas of emphysema and fibrosis/scarring. 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 Moderate right cavernous/paraclinoid ICA stenosis. Bilaterally there is occlusive or near occlusive stenosis of the M1 MCAs, likely mildly progressed from the prior. There are collaterals bilaterally. The left A1 ACA is diminutive. The right A1 ACA more distal ACAs are patent. Posterior circulation: Small vertebrobasilar system. The left intradural vertebral artery is stenotic but remains patent. The right intradural vertebral artery terminates as PICA, anatomic variant. Irregular opacification of the basilar artery, similar. The posterior communicating arteries largely supply the posterior cerebral areas which remain patent. Venous sinuses: As permitted by contrast timing,  patent. Anatomic variants: Detailed above. Review of the MIP images confirms the above findings IMPRESSION: 1. In comparison to 2020 CTA, some progression of occlusive intracranial disease, suggestive of moyamoya type process and described above. 2. Evaluation of the proximal vertebral arteries limited by streak artifact with apparent non-opacification of the left vertebral artery proximally and poor opacification right vertebral artery, suspicious for interval proximal occlusion and/or stenosis. Distal reconstitution bilaterally. Intracranially, posterior cerebral arteries are largely supplied by posterior communicating arteries.  Electronically Signed   By: Margaretha Sheffield M.D.   On: 02/27/2022 08:34   MR BRAIN WO CONTRAST  Result Date: 02/27/2022 CLINICAL DATA:  Stroke follow-up, evaluate new hypodensity by CT. EXAM: MRI HEAD WITHOUT CONTRAST TECHNIQUE: Multiplanar, multiecho pulse sequences of the brain and surrounding structures were obtained without intravenous contrast. COMPARISON:  11/19/2020 FINDINGS: Brain: Small acute infarcts seen in the low right pontine medullary junction, left lateral medulla, and clustered in the left cerebellum. Pre-existing right cerebellar infarction. Small cortical infarcts have occurred along the cerebral convexities, greatest at the high right frontal and left parietal cortex. Brain volume is normal. No acute hemorrhage, hydrocephalus, or masslike finding Vascular: Grossly preserved flow voids when compared to prior. Skull and upper cervical spine: Normal marrow signal. Sinuses/Orbits: Negative. IMPRESSION: 1. Small acute infarcts in the left lateral medulla, right pontine medullary junction, and left cerebellum. 2. Remote infarcts in the right cerebellum and bilateral cerebral cortex. Electronically Signed   By: Jorje Guild M.D.   On: 02/27/2022 05:18   CT Head Wo Contrast  Result Date: 02/26/2022 CLINICAL DATA:  Transient ischemic attack. Nausea and vomiting.  History of strokes. EXAM: CT HEAD WITHOUT CONTRAST TECHNIQUE: Contiguous axial images were obtained from the base of the skull through the vertex without intravenous contrast. RADIATION DOSE REDUCTION: This exam was performed according to the departmental dose-optimization program which includes automated exposure control, adjustment of the mA and/or kV according to patient size and/or use of iterative reconstruction technique. COMPARISON:  11/19/2020. FINDINGS: Brain: No acute intracranial hemorrhage, midline shift or mass effect. No extra-axial fluid collection. Encephalomalacia is present in the frontal lobe on the right, unchanged. There is a old infarct in the right cerebellar hemisphere. Scattered periventricular white matter hypodensities are present bilaterally. No hydrocephalus. A vague hypodense region is noted in the frontal lobe on the left which is new from the prior exam. Vascular: No hyperdense vessel or unexpected calcification. Skull: Normal. Negative for fracture or focal lesion. Sinuses/Orbits: A round density is present in the right maxillary sinus, possible mucosal retention cyst or polyp. No acute orbital abnormality. Other: None. IMPRESSION: 1. Vague hypodensity in the frontal lobe on the left which is new from the previous exam. MRI is suggested for further evaluation. 2. Chronic microvascular ischemic changes with old infarcts in the right cerebellar hemisphere and right frontal lobe. Electronically Signed   By: Brett Fairy M.D.   On: 02/26/2022 21:31    Pending Labs Unresulted Labs (From admission, onward)    None       Vitals/Pain Today's Vitals   02/27/22 2041 02/27/22 2145 02/27/22 2230 02/27/22 2234  BP: (!) 161/83 (!) 143/78 (!) 143/75   Pulse:  88 87   Resp:  (!) 21 20   Temp:    98.5 F (36.9 C)  TempSrc:    Oral  SpO2:  95% 100%   Weight:      Height:      PainSc:        Isolation Precautions No active isolations  Medications Medications  clopidogrel  (PLAVIX) tablet 75 mg (0 mg Oral Hold 02/27/22 1034)  aspirin EC tablet 81 mg (0 mg Oral Hold 02/27/22 1035)  atorvastatin (LIPITOR) tablet 80 mg (0 mg Oral Hold 02/27/22 1034)  enoxaparin (LOVENOX) injection 50 mg (50 mg Subcutaneous Given 02/27/22 1044)  0.9 %  sodium chloride infusion (1,000 mLs Intravenous New Bag/Given 02/27/22 2212)  acetaminophen (TYLENOL) tablet 650 mg (has no administration in time range)  Or  acetaminophen (TYLENOL) 160 MG/5ML solution 650 mg (has no administration in time range)    Or  acetaminophen (TYLENOL) suppository 650 mg (has no administration in time range)  senna-docusate (Senokot-S) tablet 1 tablet (has no administration in time range)  insulin aspart (novoLOG) injection 0-15 Units (3 Units Subcutaneous Given 02/27/22 1833)  diphenhydrAMINE (BENADRYL) injection 25 mg (25 mg Intravenous Given 02/27/22 2231)  famotidine (PEPCID) IVPB 20 mg premix (0 mg Intravenous Stopped 02/27/22 2234)  insulin aspart (novoLOG) injection 0-5 Units (2 Units Subcutaneous Given 02/27/22 2210)  ondansetron (ZOFRAN) injection 4 mg (has no administration in time range)  ondansetron (ZOFRAN-ODT) disintegrating tablet 4 mg (4 mg Oral Given 02/26/22 1206)  hydrALAZINE (APRESOLINE) tablet 25 mg (25 mg Oral Given 02/26/22 1206)  diphenhydrAMINE (BENADRYL) injection 25 mg (25 mg Intravenous Given 02/27/22 0220)  lactated ringers bolus 1,000 mL (0 mLs Intravenous Stopped 02/27/22 0442)  metoCLOPramide (REGLAN) injection 10 mg (10 mg Intravenous Given 02/27/22 0200)  iohexol (OMNIPAQUE) 350 MG/ML injection 60 mL (60 mLs Intravenous Contrast Given 02/27/22 [id number])   stroke: early stages of recovery book ( Does not apply Given 02/27/22 1843)    Mobility    Pt weak on feet and needs x2 assistance  Focused Assessments Neuro Assessment Handoff:  Swallow screen pass? Yes  Cardiac Rhythm: Normal sinus rhythm NIH Stroke Scale  Dizziness Present: No Headache Present: No Interval: Initial Level of  Consciousness (1a.)   : Alert, keenly responsive LOC Questions (1b. )   : Answers both questions correctly LOC Commands (1c. )   : Performs both tasks correctly Best Gaze (2. )  : Normal Visual (3. )  : No visual loss Facial Palsy (4. )    : Minor paralysis Motor Arm, Left (5a. )   : No drift Motor Arm, Right (5b. ) : No drift Motor Leg, Left (6a. )  : No drift Motor Leg, Right (6b. ) : No drift Limb Ataxia (7. ): Absent Sensory (8. )  : Normal, no sensory loss Best Language (9. )  : No aphasia Dysarthria (10. ): Normal Extinction/Inattention (11.)   : No Abnormality Complete NIHSS TOTAL: 1 Last date known well: 02/25/22 Last time known well: 2044 Neuro Assessment: Exceptions to [facility] Neuro Checks:   Initial (02/27/22 0734)  Has TPA been given? No If patient is a Neuro Trauma and patient is going to OR before floor call report to [place] nurse: [phone] or [phone]   R Recommendations: See Admitting Provider Note  Report given to:   Additional Notes:  Pt is a/o x 4 with purewick in place

## 2022-02-28 NOTE — Progress Notes (Signed)
OT Cancellation Note  Patient Details Name: Michelle Brooks MRN: [id number] DOB: 1965/03/30   Cancelled Treatment:    Reason Eval/Treat Not Completed: Patient at procedure or test/ unavailable (RN in room prepping pt for procedure and reporting pt is going now. Will return as schedule allows.)  Elder Cyphers, OTR/L [facility] Acute Rehabilitation Office: [phone]   Magnus Ivan 02/28/2022, 1:40 PM

## 2022-02-28 NOTE — Progress Notes (Signed)
Modified Barium Swallow Progress Note  Patient Details  Name: Michelle Brooks MRN: [id number] Date of Birth: 04-18-1965  Today's Date: 02/28/2022  Modified Barium Swallow completed.  Full report located under Chart Review in the Imaging Section.  Brief recommendations include the following:  Clinical Impression  Pt presents with reduced anterior laryngeal mobility, epiglottic deflection and UES relaxation/opening. Pt requires large consecutive boluses with high level of sensory feedback and increased pharyngeal pressure for UES opening sufficient for meaningful intake; pt can swallow about 2 oz at a time. Otherwise, with single sips only the majority of the bolus remains above UES and pools in pyriforms and is easily expectorated by pt. With larger thin boluses pt still has reduced opening at tail of bolus with almost nasal regurgitation and  about 10% of bolus residing in pyriforms that is expectorated. Pt does not aspirate. She does have excessive mucous production during efforts. Pt cannot initaite a dry swallow to clear residue. Purees and thicker liquids are not tolerated given poor UES opening and severe residue. Attempted head turn left - same as neutral; head turn right and chin tuck worsened function. Recommend pt initiate full liquids BUT NO GRITS APPLESAUSE PUDDING OR JELLO yet. No meds crushed in puree, liquid only. Pt to begin pharyngeal exercises as soon as possible. Hopeful for rapid recovery.   Swallow Evaluation Recommendations       SLP Diet Recommendations: Thin liquid   Liquid Administration via: Cup;Straw   Medication Administration: Via alternative means   Supervision: Patient able to self feed                    Yasheka Fossett, Katherene Ponto 02/28/2022,2:48 PM

## 2022-02-28 NOTE — TOC Initial Note (Signed)
Transition of Care [facility]) - Initial/Assessment Note    Patient Details  Name: Michelle Brooks MRN: [id number] Date of Birth: [birth year]  [facility] CM/SW Contact:    Verdell Carmine, RN Phone Number: 02/28/2022, 10:14 AM  Clinical Narrative:                 Presented to the ED with vomiting, numbness right side and facial droop. Previous history of CVA, HPTN, DM BP high, A1C 7 range.  Awaiting PT OT evaluation for recommendations. Patient has supportive family daughter.   TOC will follow for needs, recommendations,and transitions of care.    Barriers to Discharge: Continued Medical Work up   Patient Goals and CMS Choice            Expected Discharge Plan and Services   Discharge Planning Services: CM Consult   Living arrangements for the past 2 months: Apartment                                      Prior Living Arrangements/Services Living arrangements for the past 2 months: Apartment Lives with:: Self Patient language and need for interpreter reviewed:: Yes        Need for Family Participation in Patient Care: Yes (Comment) Care giver support system in place?: Yes (comment)   Criminal Activity/Legal Involvement Pertinent to Current Situation/Hospitalization: No - Comment as needed  Activities of Daily Living      Permission Sought/Granted                  Emotional Assessment       Orientation: : Oriented to Self Alcohol / Substance Use: Not Applicable Psych Involvement: No (comment)  Admission diagnosis:  Acute CVA (cerebrovascular accident) ([place]) [I63.9] Cerebrovascular accident (CVA), unspecified mechanism ([place]) [I63.9] Nausea and vomiting, unspecified vomiting type [R11.2] Patient Active Problem List   Diagnosis Date Noted   Acute CVA (cerebrovascular accident) ([place]) 02/27/2022   Allergic reaction to food 02/27/2022   Hematuria 10/08/2020   CVA (cerebral vascular accident) ([place]) 08/27/2018   Dehydration    Vomiting  in adult    Right hemiparesis ([place]) 10/05/2014   Diabetic neuropathy ([place]) 09/15/2014   GERD (gastroesophageal reflux disease) 09/15/2014   Stroke ([place]) 08/18/2014   CVA (cerebral infarction) 08/18/2014   Hot flashes 12/03/2012   High risk sexual behavior 05/13/2012   Chronic low back pain 12/29/2011   Obesity 10/23/2011   Financial difficulties 10/23/2011   Poor dentition 10/23/2011   Paresthesia of left arm and leg 10/23/2011   Preventative health care 05/13/2011   Dyslipidemia 04/01/2011   Hypertension 03/27/2011   History of stroke 03/13/2010   Type 2 diabetes mellitus with hyperlipidemia ([place]) 02/11/2004   PCP:  Nolene Ebbs, MD Pharmacy:   [facility] [address] [place] [address] Phone: [phone] Fax: [place], [place] [address] [place] [address] Phone: [phone] Fax: [phone]  [facility] ([place] - [address] [place] [address] Phone: [phone] Fax: [phone]     Social Determinants of Health (SDOH) Social History: SDOH Screenings   Depression (PHQ2-9): Low Risk  (10/11/2018)  Tobacco Use: Medium Risk (02/26/2022)   SDOH Interventions:  Readmission Risk Interventions     No data to display

## 2022-02-28 NOTE — Progress Notes (Signed)
Initial Nutrition Assessment  DOCUMENTATION CODES:   Obesity unspecified  INTERVENTION:  Continue current diet as ordered per SLP, advance as able Ensure Enlive po TID, each supplement provides 350 kcal and 20 grams of protein.  NUTRITION DIAGNOSIS:   Inadequate oral intake related to dysphagia as evidenced by  (inability to tolerate solid food).  GOAL:   Patient will meet greater than or equal to 90% of their needs  MONITOR:   PO intake, Diet advancement, Supplement acceptance  REASON FOR ASSESSMENT:  Consult Assessment of nutrition requirement/status  ASSESSMENT:  Pt with hx of Type 2 DM, HTN, HLD, GERD, and 3 prior CVAs presented to ED with N/V, dysphagia, and left sided weakness. Imaging showed multiple acute infarcts involving left lateral medulla, right pontomedullary junction and left cerebellum.   Pt at [facility] during first attempted visit and being assisted with care by NT at second attempt.   Discussed swallowing with SLP this AM, pt taken for MBS and was able to  be placed on a full liquid diet. SLP notes good chance for functional recovery. Will add ensure to augment intake and assist with meeting needs until diet can be further advanced.  Will follow-up for physical exam and hx.   Nutritionally Relevant Medications: Scheduled Meds:  atorvastatin  80 mg Oral Daily   insulin aspart  0-15 Units Subcutaneous TID WC   insulin aspart  0-5 Units Subcutaneous QHS   Continuous Infusions:  sodium chloride 1,000 mL (02/27/22 2212)   PRN Meds: diphenhydrAMINE, ondansetron, senna-docusate  Labs Reviewed: Creatinine 1.09 CBG ranges from 145-214 mg/dL over the last 24 hours  NUTRITION - FOCUSED PHYSICAL EXAM: Defer to follow-up assessment  Diet Order:   Diet Order             Diet full liquid Room service appropriate? Yes; Fluid consistency: Thin  Diet effective now                   EDUCATION NEEDS:  No education needs have been identified at this  time  Skin:  Skin Assessment: Reviewed RN Assessment  Last BM:  1/19  Height:  Ht Readings from Last 1 Encounters:  02/26/22 5\' 8"  (1.727 m)    Weight:  Wt Readings from Last 1 Encounters:  02/26/22 113.4 kg    Ideal Body Weight:  63.6 kg  BMI:  Body mass index is 38.01 kg/m.  Estimated Nutritional Needs:  Kcal:  1900-2100 kcal/d Protein:  90-115g/d Fluid:  >/=2L/d    Ranell Patrick, RD, LDN Clinical Dietitian RD pager # available in [facility]  After hours/weekend pager # available in [facility]

## 2022-03-01 DIAGNOSIS — Z794 Long term (current) use of insulin: Secondary | ICD-10-CM | POA: Diagnosis not present

## 2022-03-01 DIAGNOSIS — I639 Cerebral infarction, unspecified: Secondary | ICD-10-CM | POA: Diagnosis not present

## 2022-03-01 DIAGNOSIS — E1165 Type 2 diabetes mellitus with hyperglycemia: Secondary | ICD-10-CM | POA: Diagnosis not present

## 2022-03-01 DIAGNOSIS — I10 Essential (primary) hypertension: Secondary | ICD-10-CM | POA: Diagnosis not present

## 2022-03-01 LAB — GLUCOSE, CAPILLARY
Glucose-Capillary: 168 mg/dL — ABNORMAL HIGH (ref 70–99)
Glucose-Capillary: 110 mg/dL — ABNORMAL HIGH (ref 70–99)
Glucose-Capillary: 113 mg/dL — ABNORMAL HIGH (ref 70–99)
Glucose-Capillary: 187 mg/dL — ABNORMAL HIGH (ref 70–99)

## 2022-03-01 LAB — CBC
HCT: 41.3 % (ref 36.0–46.0)
Hemoglobin: 14.2 g/dL (ref 12.0–15.0)
MCH: 28.9 pg (ref 26.0–34.0)
MCHC: 34.4 g/dL (ref 30.0–36.0)
MCV: 84.1 fL (ref 80.0–100.0)
Platelets: 252 10*3/uL (ref 150–400)
RBC: 4.91 MIL/uL (ref 3.87–5.11)
RDW: 13.1 % (ref 11.5–15.5)
WBC: 9.8 10*3/uL (ref 4.0–10.5)
nRBC: 0 % (ref 0.0–0.2)

## 2022-03-01 LAB — BASIC METABOLIC PANEL
Anion gap: 9 (ref 5–15)
BUN: 10 mg/dL (ref 6–20)
CO2: 23 mmol/L (ref 22–32)
Calcium: 8.8 mg/dL — ABNORMAL LOW (ref 8.9–10.3)
Chloride: 103 mmol/L (ref 98–111)
Creatinine, Ser: 1.09 mg/dL — ABNORMAL HIGH (ref 0.44–1.00)
GFR, Estimated: 60 mL/min — ABNORMAL LOW (ref >60)
Glucose, Bld: 151 mg/dL — ABNORMAL HIGH (ref 70–99)
Potassium: 3.5 mmol/L (ref 3.5–5.1)
Sodium: 135 mmol/L (ref 135–145)

## 2022-03-01 LAB — MAGNESIUM: Magnesium: 1.9 mg/dL (ref 1.7–2.4)

## 2022-03-01 MED ORDER — POTASSIUM CHLORIDE 10 MEQ/100ML IV SOLN
10.0000 meq | INTRAVENOUS | Status: AC
  Administered 2022-03-01 (×4): 10 meq via INTRAVENOUS
  Filled 2022-03-01 (×4): qty 100

## 2022-03-01 NOTE — Progress Notes (Signed)
[facility] PROGRESS NOTE   Michelle Brooks WUX:[id number] DOB: 08-Jul-1965 DOA: 02/26/2022  PCP: Nolene Ebbs, MD  Brief History/Interval Summary:  57 y.o. female with medical history significant of chronic back pain, DM, HTN, prior CVA, and HLD presenting with emesis.  She has a h/o prior CVAs and these have historically presented with n/v.  She ate a lot of shrimp (no known allergy) and awoke with n/v.  She also noticed some dysphagia, dysarthria, tongue swelling, and eventual left-sided weakness (facial droop as well as LUE/LLE).   She was hospitalized due to concern for acute stroke.    Consultants: Neurology  Procedures: Echocardiogram    Subjective/Interval History: Patient had difficulty swallowing the full liquid diet this morning.  Denies other complaints at this time.  Left-sided weakness appears to be improving.    Assessment/Plan:  Acute stroke MRI confirms stroke. Neurology has been consulted. Patient to be on aspirin and Plavix for 3 months followed by Plavix alone. LDL is 118.  Patient was on pravastatin prior to admission.  Changed over to atorvastatin. HbA1c 8.7. Echocardiogram shows normal systolic function with grade 1 diastolic dysfunction. Skilled nursing facility recommended by PT.  Oropharyngeal dysphagia Speech therapy is following.  Currently on full liquids.  Will need to see how she does with speech therapy evaluation today.  May need NG feeding tube.  Questionable allergic reaction Patient had evidence for angioedema at the time of admission.  Likely associated with shrimp intake recently. She was given Benadryl and Pepcid with improvement.  Essential hypertension Allowing permissive hypertension ARB on hold.  Diabetes mellitus type 2, uncontrolled with hyperglycemia HbA1c 8.7.  Her home medicines are currently on hold.  Currently just on SSI. Supplement potassium  Obesity Estimated body mass index is 38.01 kg/m as calculated from  the following:   Height as of this encounter: 5\' 8"  (1.727 m).   Weight as of this encounter: 113.4 kg.  DVT Prophylaxis: Lovenox Code Status: Full code Family Communication: Discussed patient and her husband Disposition Plan: To be determined  Status is: Inpatient Remains inpatient appropriate because: Acute stroke      Medications: Scheduled:  aspirin EC  81 mg Oral Daily   aspirin  300 mg Rectal Daily   atorvastatin  80 mg Oral Daily   clopidogrel  75 mg Oral Daily   enoxaparin (LOVENOX) injection  50 mg Subcutaneous Q24H   feeding supplement  237 mL Oral TID BM   insulin aspart  0-15 Units Subcutaneous TID WC   insulin aspart  0-5 Units Subcutaneous QHS   Continuous:  sodium chloride 50 mL/hr at 03/01/22 0052   famotidine (PEPCID) IV 20 mg (03/01/22 0926)   potassium chloride     OZD:[id number] **OR** acetaminophen (TYLENOL) oral liquid 160 mg/5 mL **OR** acetaminophen, diphenhydrAMINE, ondansetron (ZOFRAN) IV, senna-docusate  Antibiotics: Anti-infectives (From admission, onward)    None       Objective:  Vital Signs  Vitals:   02/28/22 1953 02/28/22 2348 03/01/22 0412 03/01/22 0736  BP: (!) 182/77 (!) 150/87 (!) 153/75 (!) 172/92  Pulse: 69 85 80 78  Resp: 18 18 18 18   Temp: 98.2 F (36.8 C) 98.7 F (37.1 C) 98.6 F (37 C) 98.7 F (37.1 C)  TempSrc: Oral Oral Oral Oral  SpO2: 96% 96% 95% 93%  Weight:      Height:        Intake/Output Summary (Last 24 hours) at 03/01/2022 1019 Last data filed at 03/01/2022 0438 Gross per 24 hour  Intake --  Output 1250 ml  Net -1250 ml    Filed Weights   02/26/22 1157  Weight: 113.4 kg    General appearance: Awake alert.  In no distress Resp: Clear to auscultation bilaterally.  Normal effort Cardio: S1-S2 is normal regular.  No S3-S4.  No rubs murmurs or bruit GI: Abdomen is soft.  Nontender nondistended.  Bowel sounds are present normal.  No masses organomegaly Extremities: No edema.   Neurologic:  Alert and oriented x3.  Left-sided weakness appears to be improving   Lab Results:  Data Reviewed: I have personally reviewed following labs and reports of the imaging studies  CBC: Recent Labs  Lab 02/26/22 1220 03/01/22 0635  WBC 10.4 9.8  HGB 14.8 14.2  HCT 47.0* 41.3  MCV 89.7 84.1  PLT 288 252     Basic Metabolic Panel: Recent Labs  Lab 02/26/22 1220 03/01/22 0635  NA 139 135  K 3.8 3.5  CL 106 103  CO2 19* 23  GLUCOSE 164* 151*  BUN 14 10  CREATININE 1.09* 1.09*  CALCIUM 9.4 8.8*     GFR: Estimated Creatinine Clearance: 76.1 mL/min (A) (by C-G formula based on SCr of 1.09 mg/dL (H)).  Liver Function Tests: Recent Labs  Lab 02/26/22 1220  AST 18  ALT 18  ALKPHOS 93  BILITOT 0.2*  PROT 7.2  ALBUMIN 3.8     Recent Labs  Lab 02/26/22 1220  LIPASE 40     Coagulation Profile: Recent Labs  Lab 02/27/22 0134  INR 1.0     HbA1C: Recent Labs    02/27/22 1025  HGBA1C 8.7*     CBG: Recent Labs  Lab 02/28/22 0848 02/28/22 1244 02/28/22 1630 02/28/22 2128 03/01/22 0612  GLUCAP 145* 145* 138* 135* 168*     Lipid Profile: Recent Labs    02/27/22 1025  CHOL 174  HDL 36*  LDLCALC 118*  TRIG 98  CHOLHDL 4.8     Thyroid Function Tests: Recent Labs    02/27/22 1025  TSH 0.499      Radiology Studies: DG Swallowing Func-Speech Pathology  Result Date: 02/28/2022 Table formatting from the original result was not included. Objective Swallowing Evaluation: Type of Study: MBS-Modified Barium Swallow Study  Patient Details Name: Michelle Brooks MRN: [id number] Date of Birth: [birth year] Today's Date: 02/28/2022 Time: SLP Start Time (ACUTE ONLY): 1410 -SLP Stop Time (ACUTE ONLY): 1430 SLP Time Calculation (min) (ACUTE ONLY): 20 min Past Medical History: Past Medical History: Diagnosis Date  Chronic back pain   Diabetes mellitus type 2 in obese (HCC)   Diabetic retinopathy (HCC)   GERD (gastroesophageal reflux disease)   Headache    History of stroke 03/2010  Right MCA stroke in 03/2010, with MRI also noting subacute strokes in left fronto parietal area - occured in [place] received care at [facility] thought due to uncontrolled blood pressure // No residual deficits // TTE (03/2010) at [facility] - normal LV systolic function, moderate pericardial effusion with 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  HSV-2 infection   Hyperlipidemia LDL goal < 100 04/01/2011  Hypertension   Lumbago   pain in the back Past Surgical History: Past Surgical History: Procedure Laterality Date  BREAST LUMPECTOMY  2002  states benign lymph node removed.  BUBBLE STUDY  08/30/2018  Procedure: BUBBLE STUDY;  Surgeon: Vesta Mixer, MD;  Location: [facility] ENDOSCOPY;  Service: Cardiovascular;;  ENDOMETRIAL BIOPSY  10/04/2020  LOOP RECORDER  INSERTION N/A 08/30/2018  Procedure: LOOP RECORDER INSERTION;  Surgeon: Evans Lance, MD;  Location: [place] CV LAB;  Service: Cardiovascular;  Laterality: N/A;  TEE WITHOUT CARDIOVERSION N/A 08/30/2018  Procedure: TRANSESOPHAGEAL ECHOCARDIOGRAM (TEE);  Surgeon: Acie Fredrickson Wonda Cheng, MD;  Location: [facility] ENDOSCOPY;  Service: Cardiovascular;  Laterality: N/A; HPI: DEJANAY WAMBOLDT is a 57 y.o. female with medical history significant of chronic back pain, DM, HTN, prior CVA, and HLD presenting with emesis.  She has a h/o prior CVAs and these have historically presented with n/v.  She ate a lot of shrimp (no known allergy) and awoke yesterday AM with n/v.  She also noticed some dysphagia, dysarthria, tongue swelling, and eventual left-sided weakness (facial droop as well as LUE/LLE).   Her symptoms have progressed some while in the ER.  The patient appears to be more concerned about possible allergic reaction as the cause for her difficulty swallowing. AFter initial assessment MRI showed multiple acute infarcts involving left lateral medulla, right pontomedullary junction and left cerebellum.   Subjective: "I can't swallow"  Recommendations for follow up therapy are one component of a multi-disciplinary discharge planning process, led by the attending physician.  Recommendations may be updated based on patient status, additional functional criteria and insurance authorization. Assessment / Plan / Recommendation   02/28/2022   2:00 PM Clinical Impressions Clinical Impression Pt presents with reduced anterior laryngeal mobility, epiglottic deflection and UES relaxation/opening. Pt requires large consecutive boluses with high level of sensory feedback and increased pharyngeal pressure for UES opening sufficient for meaningful intake; pt can swallow about 2 oz at a time. Otherwise, with single sips only the majority of the bolus remains above UES and pools in pyriforms and is easily expectorated by pt. With larger thin boluses pt still has reduced opening at tail of bolus with almost nasal regurgitation and  about 10% of bolus residing in pyriforms that is expectorated. Pt does not aspirate. She does have excessive mucous production during efforts. Pt cannot initaite a dry swallow to clear residue. Purees and thicker liquids are not tolerated given poor UES opening and severe residue. Attempted head turn left - same as neutral; head turn right and chin tuck worsened function. Recommend pt initiate full liquids BUT NO GRITS APPLESAUSE PUDDING OR JELLO yet. No meds crushed in puree, liquid only. Pt to begin pharyngeal exercises as soon as possible. Hopeful for rapid recovery. 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     02/28/2022   2:00 PM Treatment Recommendations Treatment Recommendations Therapy as outlined in treatment plan below     02/27/2022   2:00 PM Prognosis Prognosis for Safe Diet Advancement Fair Barriers to Reach  Goals Other (Comment);Severity of deficits   02/28/2022   2:00 PM Diet Recommendations SLP Diet Recommendations Thin liquid Liquid Administration via Cup;Straw Medication Administration Via alternative means     02/28/2022   2:00 PM Other Recommendations Follow Up Recommendations Acute inpatient rehab (3hours/day)   02/28/2022   2:00 PM Frequency and Duration  Speech Therapy Frequency (ACUTE ONLY) min 2x/week Treatment Duration 2 weeks     02/28/2022   2:00 PM Oral Phase Oral Phase [facility]    02/28/2022   2:00 PM Pharyngeal Phase Pharyngeal Phase Impaired Pharyngeal- Nectar Teaspoon Reduced anterior laryngeal mobility;Pharyngeal residue - cp segment;Pharyngeal residue - pyriform;Reduced epiglottic inversion Pharyngeal- Nectar Cup Reduced anterior laryngeal mobility;Pharyngeal residue - cp segment;Pharyngeal residue - pyriform;Reduced epiglottic  inversion Pharyngeal- Thin Teaspoon Reduced anterior laryngeal mobility;Pharyngeal residue - cp segment;Pharyngeal residue - pyriform;Reduced epiglottic inversion Pharyngeal- Thin Cup Reduced anterior laryngeal mobility;Pharyngeal residue - cp segment;Pharyngeal residue - pyriform;Reduced epiglottic inversion Pharyngeal- Thin Straw Reduced anterior laryngeal mobility;Pharyngeal residue - cp segment;Pharyngeal residue - pyriform;Reduced epiglottic inversion Pharyngeal- Puree Reduced anterior laryngeal mobility;Pharyngeal residue - cp segment;Reduced epiglottic inversion    02/28/2022   2:00 PM Cervical Esophageal Phase  Cervical Esophageal Phase Impaired Nectar Teaspoon Reduced cricopharyngeal relaxation Nectar Cup Reduced cricopharyngeal relaxation Nectar Straw Reduced cricopharyngeal relaxation Thin Teaspoon Reduced cricopharyngeal relaxation Thin Cup Reduced cricopharyngeal relaxation Thin Straw Reduced cricopharyngeal relaxation Puree Reduced cricopharyngeal relaxation DeBlois, Riley Nearing 02/28/2022, 2:49 PM                     ECHOCARDIOGRAM COMPLETE BUBBLE STUDY  Result  Date: 02/27/2022    ECHOCARDIOGRAM REPORT   Patient Name:   Michelle Brooks Date of Exam: 02/27/2022 Medical Rec #:  [id number]      Height:       68.0 in Accession #:    [id number]     Weight:       250.0 lb Date of Birth:  11-Jan-1966       BSA:          2.247 m Patient Age:    56 years       BP:           184/86 mmHg Patient Gender: F              HR:           80 bpm. Exam Location:  Inpatient Procedure: 2D Echo, Color Doppler, Cardiac Doppler and Saline Contrast Bubble            Study Indications:    Stroke i63.9;  History:        Patient has prior history of Echocardiogram examinations, most                 recent 08/30/2018. Risk Factors:Hypertension, Diabetes and                 Dyslipidemia.  Sonographer:    Irving Burton Senior RDCS Referring Phys: [id number] [facility] IMPRESSIONS  1. Left ventricular ejection fraction, by estimation, is 60 to 65%. The left ventricle has normal function. The left ventricle has no regional wall motion abnormalities. There is mild concentric left ventricular hypertrophy. Left ventricular diastolic parameters are consistent with Grade I diastolic dysfunction (impaired relaxation).  2. Right ventricular systolic function is normal. The right ventricular size is normal. Tricuspid regurgitation signal is inadequate for assessing PA pressure.  3. No evidence of mitral valve regurgitation.  4. The aortic valve is tricuspid. Aortic valve regurgitation is not visualized.  5. The inferior vena cava is normal in size with greater than 50% respiratory variability, suggesting right atrial pressure of 3 mmHg.  6. Agitated saline contrast bubble study was negative, with no evidence of any interatrial shunt. Comparison(s): No significant change from prior study. Conclusion(s)/Recommendation(s): Prior TEE in 2020 showed possible very small PFO or AVM. No significant PFO seen on TTE. With recurrent stroke, recommend repeat TEE with more bicaval views (3D imaging) to determine if has PFO amenable for  closure. Can also r/o veg related emboli. ILR did not show afib. FINDINGS  Left Ventricle: Left ventricular ejection fraction, by estimation, is 60 to 65%. The left ventricle has normal function. The left ventricle has no regional wall motion abnormalities. The  left ventricular internal cavity size was normal in size. There is  mild concentric left ventricular hypertrophy. Left ventricular diastolic parameters are consistent with Grade I diastolic dysfunction (impaired relaxation). Right Ventricle: The right ventricular size is normal. Right ventricular systolic function is normal. Tricuspid regurgitation signal is inadequate for assessing PA pressure. Left Atrium: Left atrial size was normal in size. Right Atrium: Right atrial size was normal in size. Pericardium: There is no evidence of pericardial effusion. Mitral Valve: No evidence of mitral valve regurgitation. Tricuspid Valve: Tricuspid valve regurgitation is not demonstrated. Aortic Valve: The aortic valve is tricuspid. Aortic valve regurgitation is not visualized. Pulmonic Valve: Pulmonic valve regurgitation is not visualized. Aorta: The aortic root and ascending aorta are structurally normal, with no evidence of dilitation. Venous: The inferior vena cava is normal in size with greater than 50% respiratory variability, suggesting right atrial pressure of 3 mmHg. IAS/Shunts: Agitated saline contrast was given intravenously to evaluate for intracardiac shunting. Agitated saline contrast bubble study was negative, with no evidence of any interatrial shunt.  LEFT VENTRICLE PLAX 2D LVIDd:         4.10 cm   Diastology LVIDs:         2.90 cm   LV e' medial:    6.64 cm/s LV PW:         1.70 cm   LV E/e' medial:  12.1 LV IVS:        1.10 cm   LV e' lateral:   8.38 cm/s LVOT diam:     2.20 cm   LV E/e' lateral: 9.6 LV SV:         68 LV SV Index:   30 LVOT Area:     3.80 cm  RIGHT VENTRICLE RV S prime:     14.80 cm/s TAPSE (M-mode): 2.2 cm LEFT ATRIUM              Index        RIGHT ATRIUM           Index LA diam:        3.30 cm 1.47 cm/m   RA Area:     13.90 cm LA Vol (A2C):   46.1 ml 20.52 ml/m  RA Volume:   30.90 ml  13.75 ml/m LA Vol (A4C):   52.2 ml 23.23 ml/m LA Biplane Vol: 49.7 ml 22.12 ml/m  AORTIC VALVE LVOT Vmax:   93.30 cm/s LVOT Vmean:  57.100 cm/s LVOT VTI:    0.178 m  AORTA Ao Root diam: 3.50 cm Ao Asc diam:  3.40 cm MITRAL VALVE MV Area (PHT): 2.81 cm     SHUNTS MV Decel Time: 270 msec     Systemic VTI:  0.18 m MV E velocity: 80.10 cm/s   Systemic Diam: 2.20 cm MV A velocity: 109.00 cm/s MV E/A ratio:  0.73 [place] signed by Carolan Clines Signature Date/Time: 02/27/2022/11:44:43 AM    Final        LOS: 2 days   Osvaldo Shipper  [facility] Pager on [email or web address]  03/01/2022, 10:19 AM

## 2022-03-02 DIAGNOSIS — I10 Essential (primary) hypertension: Secondary | ICD-10-CM | POA: Diagnosis not present

## 2022-03-02 DIAGNOSIS — E1165 Type 2 diabetes mellitus with hyperglycemia: Secondary | ICD-10-CM | POA: Diagnosis not present

## 2022-03-02 DIAGNOSIS — I639 Cerebral infarction, unspecified: Secondary | ICD-10-CM | POA: Diagnosis not present

## 2022-03-02 DIAGNOSIS — Z794 Long term (current) use of insulin: Secondary | ICD-10-CM | POA: Diagnosis not present

## 2022-03-02 LAB — BASIC METABOLIC PANEL
Anion gap: 9 (ref 5–15)
BUN: 12 mg/dL (ref 6–20)
CO2: 23 mmol/L (ref 22–32)
Calcium: 8.9 mg/dL (ref 8.9–10.3)
Chloride: 105 mmol/L (ref 98–111)
Creatinine, Ser: 1.26 mg/dL — ABNORMAL HIGH (ref 0.44–1.00)
GFR, Estimated: 50 mL/min — ABNORMAL LOW (ref >60)
Glucose, Bld: 125 mg/dL — ABNORMAL HIGH (ref 70–99)
Potassium: 3.5 mmol/L (ref 3.5–5.1)
Sodium: 137 mmol/L (ref 135–145)

## 2022-03-02 LAB — GLUCOSE, CAPILLARY
Glucose-Capillary: 140 mg/dL — ABNORMAL HIGH (ref 70–99)
Glucose-Capillary: 104 mg/dL — ABNORMAL HIGH (ref 70–99)
Glucose-Capillary: 107 mg/dL — ABNORMAL HIGH (ref 70–99)
Glucose-Capillary: 121 mg/dL — ABNORMAL HIGH (ref 70–99)

## 2022-03-02 LAB — MAGNESIUM: Magnesium: 2.1 mg/dL (ref 1.7–2.4)

## 2022-03-02 MED ORDER — POTASSIUM CHLORIDE IN NACL 20-0.45 MEQ/L-% IV SOLN
INTRAVENOUS | Status: DC
  Filled 2022-03-02 (×2): qty 1000

## 2022-03-02 MED ORDER — ATORVASTATIN CALCIUM 80 MG PO TABS: 80.0000 mg | ORAL_TABLET | Freq: Every day | ORAL | Status: DC

## 2022-03-02 MED ORDER — CLOPIDOGREL BISULFATE 75 MG PO TABS: 75.0000 mg | ORAL_TABLET | Freq: Every day | ORAL | Status: DC

## 2022-03-02 NOTE — Progress Notes (Signed)
[facility] PROGRESS NOTE   [name]:[id number] DOB: 09/23/1965 DOA: 02/26/2022  PCP: Nolene Ebbs, MD  Brief History/Interval Summary:  57 y.o. female with medical history significant of chronic back pain, DM, HTN, prior CVA, and HLD presenting with emesis.  She has a h/o prior CVAs and these have historically presented with n/v.  She ate a lot of shrimp (no known allergy) and awoke with n/v.  She also noticed some dysphagia, dysarthria, tongue swelling, and eventual left-sided weakness (facial droop as well as LUE/LLE).   She was hospitalized due to concern for acute stroke.    Consultants: Neurology  Procedures: Echocardiogram    Subjective/Interval History: Patient mentioned that she was able to tolerate her soup this morning without any vomiting or difficulty swallowing.  Otherwise she denies any other issues.  Left side strength is improving.      Assessment/Plan:  Acute stroke MRI confirms stroke. Neurology has been consulted. Patient to be on aspirin and Plavix for 3 months followed by Plavix alone. LDL is 118.  Patient was on pravastatin prior to admission.  Changed over to atorvastatin. HbA1c 8.7. Echocardiogram shows normal systolic function with grade 1 diastolic dysfunction. Skilled nursing facility recommended by PT. Stable from a neurological standpoint.  Oropharyngeal dysphagia Speech therapy is following.  Currently on full liquids.  Speech therapy to reevaluate.  Hopefully we can avoid NG feeding tube.  Elevated creatinine Mildly elevated creatinine noted today.  Significance is unclear.  Will give her IV fluids and then recheck labs tomorrow.  Questionable allergic reaction Patient had evidence for angioedema at the time of admission.  Likely associated with shrimp intake recently. She was given Benadryl and Pepcid with improvement. Appears to have resolved.  Dysphagia appears to be primarily secondary to her stroke rather than  angioedema.  Essential hypertension Allowing permissive hypertension. ARB on hold. Should be able to resume her ARB at discharge.  Diabetes mellitus type 2, uncontrolled with hyperglycemia HbA1c 8.7.  Her home medicines are currently on hold.  Currently just on SSI. Supplement potassium  Obesity Estimated body mass index is 38.01 kg/m as calculated from the following:   Height as of this encounter: 5\' 8"  (1.727 m).   Weight as of this encounter: 113.4 kg.  DVT Prophylaxis: Lovenox Code Status: Full code Family Communication: Discussed patient and her daughter Disposition Plan: SNF recommended by PT.  Patient seems to be hesitant.  Status is: Inpatient Remains inpatient appropriate because: Acute stroke      Medications: Scheduled:  aspirin EC  81 mg Oral Daily   aspirin  300 mg Rectal Daily   atorvastatin  80 mg Oral Daily   clopidogrel  75 mg Oral Daily   enoxaparin (LOVENOX) injection  50 mg Subcutaneous Q24H   feeding supplement  237 mL Oral TID BM   insulin aspart  0-15 Units Subcutaneous TID WC   insulin aspart  0-5 Units Subcutaneous QHS   Continuous:  0.45 % NaCl with KCl 20 mEq / L     famotidine (PEPCID) IV 20 mg (03/01/22 2017)   BHA:[id number] **OR** acetaminophen (TYLENOL) oral liquid 160 mg/5 mL **OR** acetaminophen, diphenhydrAMINE, ondansetron (ZOFRAN) IV, senna-docusate  Antibiotics: Anti-infectives (From admission, onward)    None       Objective:  Vital Signs  Vitals:   03/01/22 2002 03/02/22 0036 03/02/22 0445 03/02/22 0851  BP: (!) 169/91 (!) 151/83 139/66 (!) 147/84  Pulse: 75 81 81 78  Resp: 19 20 16 18   Temp:  98.6 F (37 C) 98.7 F (37.1 C) 98.9 F (37.2 C) 98.4 F (36.9 C)  TempSrc: Oral Oral Oral   SpO2: 96% 95% 96% 97%  Weight:      Height:        Intake/Output Summary (Last 24 hours) at 03/02/2022 1005 Last data filed at 03/02/2022 0445 Gross per 24 hour  Intake 1430 ml  Output 1125 ml  Net 305 ml    Filed  Weights   02/26/22 1157  Weight: 113.4 kg    General appearance: Awake alert.  In no distress Resp: Clear to auscultation bilaterally.  Normal effort Cardio: S1-S2 is normal regular.  No S3-S4.  No rubs murmurs or bruit GI: Abdomen is soft.  Nontender nondistended.  Bowel sounds are present normal.  No masses organomegaly Extremities: No edema.  Full range of motion of lower extremities. Neurologic: Left-sided weakness is improved   Lab Results:  Data Reviewed: I have personally reviewed following labs and reports of the imaging studies  CBC: Recent Labs  Lab 02/26/22 1220 03/01/22 0635  WBC 10.4 9.8  HGB 14.8 14.2  HCT 47.0* 41.3  MCV 89.7 84.1  PLT 288 252     Basic Metabolic Panel: Recent Labs  Lab 02/26/22 1220 03/01/22 0635 03/01/22 0642 03/02/22 0339  NA 139 135  --  137  K 3.8 3.5  --  3.5  CL 106 103  --  105  CO2 19* 23  --  23  GLUCOSE 164* 151*  --  125*  BUN 14 10  --  12  CREATININE 1.09* 1.09*  --  1.26*  CALCIUM 9.4 8.8*  --  8.9  MG  --   --  1.9 2.1     GFR: Estimated Creatinine Clearance: 65.9 mL/min (A) (by C-G formula based on SCr of 1.26 mg/dL (H)).  Liver Function Tests: Recent Labs  Lab 02/26/22 1220  AST 18  ALT 18  ALKPHOS 93  BILITOT 0.2*  PROT 7.2  ALBUMIN 3.8     Recent Labs  Lab 02/26/22 1220  LIPASE 40     Coagulation Profile: Recent Labs  Lab 02/27/22 0134  INR 1.0     HbA1C: Recent Labs    02/27/22 1025  HGBA1C 8.7*     CBG: Recent Labs  Lab 03/01/22 0612 03/01/22 1204 03/01/22 1615 03/01/22 2127 03/02/22 0610  GLUCAP 168* 110* 113* 187* 140*     Lipid Profile: Recent Labs    02/27/22 1025  CHOL 174  HDL 36*  LDLCALC 118*  TRIG 98  CHOLHDL 4.8     Thyroid Function Tests: Recent Labs    02/27/22 1025  TSH 0.499      Radiology Studies: DG Swallowing Func-Speech Pathology  Result Date: 02/28/2022 Table formatting from the original result was not included. Objective  Swallowing Evaluation: Type of Study: MBS-Modified Barium Swallow Study  Patient Details Name: Michelle Brooks MRN: [id number] Date of Birth: 11-Feb-1965 Today's Date: 02/28/2022 Time: SLP Start Time (ACUTE ONLY): 1410 -SLP Stop Time (ACUTE ONLY): 1430 SLP Time Calculation (min) (ACUTE ONLY): 20 min Past Medical History: Past Medical History: Diagnosis Date  Chronic back pain   Diabetes mellitus type 2 in obese (HCC)   Diabetic retinopathy (HCC)   GERD (gastroesophageal reflux disease)   Headache   History of stroke 03/2010  Right MCA stroke in 03/2010, with MRI also noting subacute strokes in left fronto parietal area - occured in [place] received care at [facility] thought due to uncontrolled  blood pressure // No residual deficits // TTE (03/2010) at [facility] - normal LV systolic function, moderate pericardial effusion with 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  HSV-2 infection   Hyperlipidemia LDL goal < 100 04/01/2011  Hypertension   Lumbago   pain in the back Past Surgical History: Past Surgical History: Procedure Laterality Date  BREAST LUMPECTOMY  2002  states benign lymph node removed.  BUBBLE STUDY  08/30/2018  Procedure: BUBBLE STUDY;  Surgeon: Thayer Headings, MD;  Location: [place];  Service: Cardiovascular;;  ENDOMETRIAL BIOPSY  10/04/2020  LOOP RECORDER INSERTION N/A 08/30/2018  Procedure: LOOP RECORDER INSERTION;  Surgeon: Evans Lance, MD;  Location: [place] [facility];  Service: Cardiovascular;  Laterality: N/A;  TEE WITHOUT CARDIOVERSION N/A 08/30/2018  Procedure: TRANSESOPHAGEAL ECHOCARDIOGRAM (TEE);  Surgeon: Acie Fredrickson Wonda Cheng, MD;  Location: [facility] ENDOSCOPY;  Service: Cardiovascular;  Laterality: N/A; HPI: MATISON NUCCIO is a 57 y.o. female with medical history significant of chronic back pain, DM, HTN, prior CVA, and HLD presenting with emesis.  She has a h/o prior CVAs and these have historically presented with n/v.  She ate a lot of shrimp (no  known allergy) and awoke yesterday AM with n/v.  She also noticed some dysphagia, dysarthria, tongue swelling, and eventual left-sided weakness (facial droop as well as LUE/LLE).   Her symptoms have progressed some while in the ER.  The patient appears to be more concerned about possible allergic reaction as the cause for her difficulty swallowing. AFter initial assessment MRI showed multiple acute infarcts involving left lateral medulla, right pontomedullary junction and left cerebellum.  Subjective: "I can't swallow"  Recommendations for follow up therapy are one component of a multi-disciplinary discharge planning process, led by the attending physician.  Recommendations may be updated based on patient status, additional functional criteria and insurance authorization. Assessment / Plan / Recommendation   02/28/2022   2:00 PM Clinical Impressions Clinical Impression Pt presents with reduced anterior laryngeal mobility, epiglottic deflection and UES relaxation/opening. Pt requires large consecutive boluses with high level of sensory feedback and increased pharyngeal pressure for UES opening sufficient for meaningful intake; pt can swallow about 2 oz at a time. Otherwise, with single sips only the majority of the bolus remains above UES and pools in pyriforms and is easily expectorated by pt. With larger thin boluses pt still has reduced opening at tail of bolus with almost nasal regurgitation and  about 10% of bolus residing in pyriforms that is expectorated. Pt does not aspirate. She does have excessive mucous production during efforts. Pt cannot initaite a dry swallow to clear residue. Purees and thicker liquids are not tolerated given poor UES opening and severe residue. Attempted head turn left - same as neutral; head turn right and chin tuck worsened function. Recommend pt initiate full liquids BUT NO GRITS APPLESAUSE PUDDING OR JELLO yet. No meds crushed in puree, liquid only. Pt to begin pharyngeal exercises  as soon as possible. Hopeful for rapid recovery. 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     02/28/2022   2:00 PM Treatment Recommendations Treatment Recommendations Therapy as outlined in treatment plan below     02/27/2022   2:00 PM Prognosis Prognosis for Safe Diet Advancement Fair Barriers to Reach Goals Other (Comment);Severity of deficits   02/28/2022   2:00 PM Diet Recommendations SLP Diet Recommendations Thin  liquid Liquid Administration via Cup;Straw Medication Administration Via alternative means     02/28/2022   2:00 PM Other Recommendations Follow Up Recommendations Acute inpatient rehab (3hours/day)   02/28/2022   2:00 PM Frequency and Duration  Speech Therapy Frequency (ACUTE ONLY) min 2x/week Treatment Duration 2 weeks     02/28/2022   2:00 PM Oral Phase Oral Phase [facility]    02/28/2022   2:00 PM Pharyngeal Phase Pharyngeal Phase Impaired Pharyngeal- Nectar Teaspoon Reduced anterior laryngeal mobility;Pharyngeal residue - cp segment;Pharyngeal residue - pyriform;Reduced epiglottic inversion Pharyngeal- Nectar Cup Reduced anterior laryngeal mobility;Pharyngeal residue - cp segment;Pharyngeal residue - pyriform;Reduced epiglottic inversion Pharyngeal- Thin Teaspoon Reduced anterior laryngeal mobility;Pharyngeal residue - cp segment;Pharyngeal residue - pyriform;Reduced epiglottic inversion Pharyngeal- Thin Cup Reduced anterior laryngeal mobility;Pharyngeal residue - cp segment;Pharyngeal residue - pyriform;Reduced epiglottic inversion Pharyngeal- Thin Straw Reduced anterior laryngeal mobility;Pharyngeal residue - cp segment;Pharyngeal residue - pyriform;Reduced epiglottic inversion Pharyngeal- Puree Reduced anterior laryngeal mobility;Pharyngeal residue - cp segment;Reduced epiglottic inversion    02/28/2022   2:00 PM Cervical  Esophageal Phase  Cervical Esophageal Phase Impaired Nectar Teaspoon Reduced cricopharyngeal relaxation Nectar Cup Reduced cricopharyngeal relaxation Nectar Straw Reduced cricopharyngeal relaxation Thin Teaspoon Reduced cricopharyngeal relaxation Thin Cup Reduced cricopharyngeal relaxation Thin Straw Reduced cricopharyngeal relaxation Puree Reduced cricopharyngeal relaxation DeBlois, Riley Nearing 02/28/2022, 2:49 PM                         LOS: 3 days   Kyanne Rials [employer] on [email or web address]  03/02/2022, 10:05 AM

## 2022-03-02 NOTE — Progress Notes (Signed)
SW spoke with pt and pt's dtr [facility] re PT/OT recommendation for SNF. Pt's dtr currently refusing SNF and plans for pt to return home with family support and HH. Dtr indicates no preference for [facility] provider. TOC will continue to follow and assist as indicated.   Wandra Feinstein, MSW, LCSW [phone] (coverage)

## 2022-03-02 NOTE — Progress Notes (Signed)
Speech Language Pathology Treatment: Dysphagia  Patient Details Name: Michelle Brooks MRN: [id number] DOB: March 17, 1965 Today's Date: 03/02/2022 Time: 2229-7989 SLP Time Calculation (min) (ACUTE ONLY): 45 min  Assessment / Plan / Recommendation Clinical Impression  Patient seen today at the request of her daughter who verbalized frustration over patient's inability to swallow to RN. Daughter was rightfully concerned and pleasant with this SLP. Did have questions about patients swallowing function which SLP clarified verbally and using MBS video. Skilled observation of po intake complete with patient self feeding thin liquids via multiple consecutive large boluses per MBS recommendation. She presents with relatively consistent throat clearing post swallow with expectoration of thick secretions (also noted to be expectorating without pos). Attempted large single boluses with multiple swallows which she was able to carryout with moderate cueing to lick lips and build saliva for dry swallow. This resulted in fewer throat clearing episodes. Do agree that patient continues to need larger boluses in order to increase sensory input as well as build up enough pharyngeal pressure to open UES. Educated patient and daughter. Also introduced Shaker and CTAR exercises to patient. She was able to complete 1 set of 3 with rest breaks in between and minimal cueing. Instructed to complete independently on days she does not receive SLP services 3 x each, 3 times per day. Patient and daughter verbalized understanding.    HPI HPI: Michelle Brooks is a 57 y.o. female with medical history significant of chronic back pain, DM, HTN, prior CVA, and HLD presenting with emesis.  She has a h/o prior CVAs and these have historically presented with n/v.  She ate a lot of shrimp (no known allergy) and awoke yesterday AM with n/v.  She also noticed some dysphagia, dysarthria, tongue swelling, and eventual left-sided weakness (facial droop as  well as LUE/LLE).   Her symptoms have progressed some while in the ER.  The patient appears to be more concerned about possible allergic reaction as the cause for her difficulty swallowing. AFter initial assessment MRI showed multiple acute infarcts involving left lateral medulla, right pontomedullary junction and left cerebellum.      SLP Plan  Continue with current plan of care      Recommendations for follow up therapy are one component of a multi-disciplinary discharge planning process, led by the attending physician.  Recommendations may be updated based on patient status, additional functional criteria and insurance authorization.    Recommendations  Diet recommendations: Thin liquid Liquids provided via: Cup;Straw Medication Administration: Via alternative means Supervision: Patient able to self feed;Intermittent supervision to cue for compensatory strategies Compensations: Other (Comment) (large consecutive boluses) Postural Changes and/or Swallow Maneuvers: Seated upright 90 degrees                General recommendations: Rehab consult Oral Care Recommendations: Oral care QID Follow Up Recommendations: Acute inpatient rehab (3hours/day) Assistance recommended at discharge: Frequent or constant Supervision/Assistance SLP Visit Diagnosis: Dysphagia, unspecified (R13.10);Dysarthria and anarthria (R47.1);Attention and concentration deficit;Cognitive communication deficit (R41.841) Attention and concentration deficit following: Cerebral infarction Plan: Continue with current plan of care        [facility] MA, [place]  03/02/2022, 11:36 AM

## 2022-03-03 ENCOUNTER — Inpatient Hospital Stay: Payer: 59

## 2022-03-03 DIAGNOSIS — I10 Essential (primary) hypertension: Secondary | ICD-10-CM | POA: Diagnosis not present

## 2022-03-03 DIAGNOSIS — Z794 Long term (current) use of insulin: Secondary | ICD-10-CM | POA: Diagnosis not present

## 2022-03-03 DIAGNOSIS — E1165 Type 2 diabetes mellitus with hyperglycemia: Secondary | ICD-10-CM | POA: Diagnosis not present

## 2022-03-03 DIAGNOSIS — I639 Cerebral infarction, unspecified: Secondary | ICD-10-CM | POA: Diagnosis not present

## 2022-03-03 LAB — GLUCOSE, CAPILLARY
Glucose-Capillary: 116 mg/dL — ABNORMAL HIGH (ref 70–99)
Glucose-Capillary: 170 mg/dL — ABNORMAL HIGH (ref 70–99)
Glucose-Capillary: 101 mg/dL — ABNORMAL HIGH (ref 70–99)
Glucose-Capillary: 143 mg/dL — ABNORMAL HIGH (ref 70–99)

## 2022-03-03 LAB — BASIC METABOLIC PANEL
Anion gap: 9 (ref 5–15)
BUN: 11 mg/dL (ref 6–20)
CO2: 22 mmol/L (ref 22–32)
Calcium: 8.9 mg/dL (ref 8.9–10.3)
Chloride: 106 mmol/L (ref 98–111)
Creatinine, Ser: 1.14 mg/dL — ABNORMAL HIGH (ref 0.44–1.00)
GFR, Estimated: 56 mL/min — ABNORMAL LOW (ref >60)
Glucose, Bld: 124 mg/dL — ABNORMAL HIGH (ref 70–99)
Potassium: 3.7 mmol/L (ref 3.5–5.1)
Sodium: 137 mmol/L (ref 135–145)

## 2022-03-03 MED ORDER — ENOXAPARIN SODIUM 60 MG/0.6ML IJ SOSY
0.5000 mg/kg | PREFILLED_SYRINGE | INTRAMUSCULAR | Status: DC
  Administered 2022-03-04 – 2022-03-07 (×4): 57.5 mg via SUBCUTANEOUS
  Filled 2022-03-03 (×4): qty 0.6

## 2022-03-03 MED ORDER — METOPROLOL TARTRATE 5 MG/5ML IV SOLN
2.5000 mg | Freq: Four times a day (QID) | INTRAVENOUS | Status: DC | PRN
  Administered 2022-03-03 – 2022-03-04 (×2): 2.5 mg via INTRAVENOUS
  Filled 2022-03-03 (×2): qty 5

## 2022-03-03 MED ORDER — BOOST / RESOURCE BREEZE PO LIQD CUSTOM: 1.0000 | ORAL | Status: DC

## 2022-03-03 MED ORDER — KATE FARMS STANDARD 1.4 PO LIQD
325.0000 mL | Freq: Every day | ORAL | Status: DC
  Administered 2022-03-03 – 2022-03-05 (×3): 325 mL via ORAL
  Filled 2022-03-03 (×4): qty 325

## 2022-03-03 NOTE — Progress Notes (Signed)
Patient was found eating half a peanut butter sandwich brought in from home; instructed patient that was unsafe per speech evaluation; they will reevaluate her tomorrow; supplement drink provided to the patient; safety reviewed.

## 2022-03-03 NOTE — Progress Notes (Signed)
[facility] PROGRESS NOTE   AVIS TIRONE EXB:[id number] DOB: [birth year] DOA: 02/26/2022  PCP: Nolene Ebbs, MD  Brief History/Interval Summary:  57 y.o. female with medical history significant of chronic back pain, DM, HTN, prior CVA, and HLD presenting with emesis.  She has a h/o prior CVAs and these have historically presented with n/v.  She ate a lot of shrimp (no known allergy) and awoke with n/v.  She also noticed some dysphagia, dysarthria, tongue swelling, and eventual left-sided weakness (facial droop as well as LUE/LLE).   She was hospitalized due to concern for acute stroke.    Consultants: Neurology  Procedures: Echocardiogram    Subjective/Interval History: Patient mentions that she was able to tolerate her full liquids yesterday.  No new complaints offered.    Assessment/Plan:  Acute stroke MRI confirmed stroke. Neurology has been consulted. Patient to be on aspirin and Plavix for 3 months followed by Plavix alone.  Unable to swallow medications so getting aspirin through suppository.  Plavix and statin on hold. LDL is 118.  Patient was on pravastatin prior to admission.  Changed over to atorvastatin. HbA1c 8.7. Echocardiogram shows normal systolic function with grade 1 diastolic dysfunction. Skilled nursing facility recommended by PT. but patient and family wants her to come home instead.  Will have PT reevaluate. Patient is stable from neurological standpoint.  Continues to have issues with dysphagia as discussed below.  Oropharyngeal dysphagia Continues to have dysphagia.  Speech therapy continues to follow.  Seems to be tolerating full liquid diet.  Will see how she does with therapy evaluation today.   Unable to swallow medications.   Hopefully we can avoid feeding tubes.    Elevated creatinine Creatinine is stable.  Likely has some element of chronic kidney disease.  Continue to monitor urine output.  Recheck labs tomorrow.  Questionable allergic  reaction Patient had evidence for angioedema at the time of admission.  Likely associated with shrimp intake recently. She was given Benadryl and Pepcid with improvement. Appears to have resolved.  Dysphagia appears to be primarily secondary to her stroke rather than angioedema.  Essential hypertension Allowing permissive hypertension. ARB on hold.  Diabetes mellitus type 2, uncontrolled with hyperglycemia HbA1c 8.7.  Her home medicines are currently on hold.  Currently just on SSI.  Obesity Estimated body mass index is 38.01 kg/m as calculated from the following:   Height as of this encounter: 5\' 8"  (1.727 m).   Weight as of this encounter: 113.4 kg.  DVT Prophylaxis: Lovenox Code Status: Full code Family Communication: Discussed patient and her daughter Disposition Plan: Patient wants to go home instead.  Will see how she does with PT reevaluation.  Will also need to see how her dysphagia progresses.  Status is: Inpatient Remains inpatient appropriate because: Acute stroke      Medications: Scheduled:  aspirin  300 mg Rectal Daily   [START ON 03/05/2022] atorvastatin  80 mg Oral Daily   [START ON 03/05/2022] clopidogrel  75 mg Oral Daily   enoxaparin (LOVENOX) injection  50 mg Subcutaneous Q24H   feeding supplement  237 mL Oral TID BM   insulin aspart  0-15 Units Subcutaneous TID WC   insulin aspart  0-5 Units Subcutaneous QHS   Continuous:  famotidine (PEPCID) IV 20 mg (03/03/22 0951)   PRN:[DISCONTINUED] acetaminophen **OR** acetaminophen (TYLENOL) oral liquid 160 mg/5 mL **OR** acetaminophen, diphenhydrAMINE, ondansetron (ZOFRAN) IV  Antibiotics: Anti-infectives (From admission, onward)    None       Objective:  Vital Signs  Vitals:   03/02/22 2041 03/02/22 2332 03/03/22 0357 03/03/22 0803  BP: (!) 153/83 (!) 156/100 (!) 152/83 (!) 177/94  Pulse: 81 84 75 72  Resp: 20 20 20 18   Temp: 99.1 F (37.3 C) 98.7 F (37.1 C) 98.4 F (36.9 C) 98.3 F (36.8 C)   TempSrc: Oral Oral Oral Oral  SpO2: 99% 98% 100% 94%  Weight:      Height:        Intake/Output Summary (Last 24 hours) at 03/03/2022 1020 Last data filed at 03/02/2022 2052 Gross per 24 hour  Intake 556.29 ml  Output 700 ml  Net -143.71 ml    Filed Weights   02/26/22 1157  Weight: 113.4 kg    General appearance: Awake alert.  In no distress Resp: Clear to auscultation bilaterally.  Normal effort Cardio: S1-S2 is normal regular.  No S3-S4.  No rubs murmurs or bruit GI: Abdomen is soft.  Nontender nondistended.  Bowel sounds are present normal.  No masses organomegaly Extremities: No edema.     Lab Results:  Data Reviewed: I have personally reviewed following labs and reports of the imaging studies  CBC: Recent Labs  Lab 02/26/22 1220 03/01/22 0635  WBC 10.4 9.8  HGB 14.8 14.2  HCT 47.0* 41.3  MCV 89.7 84.1  PLT 288 252     Basic Metabolic Panel: Recent Labs  Lab 02/26/22 1220 03/01/22 0635 03/01/22 0642 03/02/22 0339 03/03/22 0323  NA 139 135  --  137 137  K 3.8 3.5  --  3.5 3.7  CL 106 103  --  105 106  CO2 19* 23  --  23 22  GLUCOSE 164* 151*  --  125* 124*  BUN 14 10  --  12 11  CREATININE 1.09* 1.09*  --  1.26* 1.14*  CALCIUM 9.4 8.8*  --  8.9 8.9  MG  --   --  1.9 2.1  --      GFR: Estimated Creatinine Clearance: 72.8 mL/min (A) (by C-G formula based on SCr of 1.14 mg/dL (H)).  Liver Function Tests: Recent Labs  Lab 02/26/22 1220  AST 18  ALT 18  ALKPHOS 93  BILITOT 0.2*  PROT 7.2  ALBUMIN 3.8     Recent Labs  Lab 02/26/22 1220  LIPASE 40     Coagulation Profile: Recent Labs  Lab 02/27/22 0134  INR 1.0     CBG: Recent Labs  Lab 03/02/22 0610 03/02/22 1317 03/02/22 1538 03/02/22 2231 03/03/22 0613  GLUCAP 140* 104* 107* 121* 116*     Radiology Studies: No results found.     LOS: 4 days   Ruchel Brandenburger [employer] on [email or web address]  03/03/2022, 10:20 AM

## 2022-03-03 NOTE — Progress Notes (Signed)
Inpatient Rehab Admissions Coordinator:  ? ?Per therapy recommendations,  patient was screened for CIR candidacy by Annisha Baar, MS, CCC-SLP. At this time, Pt. Appears to be a a potential candidate for CIR. I will place   order for rehab consult per protocol for full assessment. Please contact me any with questions. ? ?Margrete Delude, MS, CCC-SLP ?Rehab Admissions Coordinator  ?[phone] (celll) ?[phone] (office) ? ?

## 2022-03-03 NOTE — Progress Notes (Signed)
   03/03/22 1455  Spiritual Encounters  Type of Visit Initial  Care provided to: Patient  Referral source Nurse (RN/NT/LPN)  Reason for visit Advance directives  OnCall Visit No   Chaplain Jorene Guest responded to the consult request for an Advance Directive. This chaplain provided A.D. education and the patient wants to appoint both of her daughters. Patient stated she is not sure if she wants to complete but will review document. This note was prepared by Jeanine Luz, M.Div..  For questions please contact by phone [phone].

## 2022-03-03 NOTE — Progress Notes (Signed)
Provided patient with ball to use during CTAR exercises for swallow. Placed on bedside table and instructed patient.   [place] MA, CCC-SLP

## 2022-03-03 NOTE — Progress Notes (Signed)
Physical Therapy Treatment Patient Details Name: Michelle Brooks MRN: [id number] DOB: Aug 23, 1965 Today's Date: 03/03/2022   History of Present Illness Pt is a 57 y.o. female with nausea, vomiting, difficult swallowing. MRI revealed small acute infarcts in the left lateral medulla, right pontine  medullary junction, and left cerebellum. 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    PT Comments    Pt greeted propped in bed and eager for OOB mobility with continued progress towards goals, however pt continues to be limited by ataxia, R hemi-weakness, and poor balance/postural reactions. Pt able to complete bed mobility with up to min assist, however pt requiring increased assist for all OOB mobility. Pt requiring up to mod assist with RW for short gait bout in room with noted ataxia of RLE and LOB x2 needing mod assist to correct and prevent falling. Pt excellent candidate for AIR level therapies, discussed with supervising PT and updated recommendation. Pt continues to benefit from skilled PT services to progress toward functional mobility goals.    Recommendations for follow up therapy are one component of a multi-disciplinary discharge planning process, led by the attending physician.  Recommendations may be updated based on patient status, additional functional criteria and insurance authorization.  Follow Up Recommendations  Acute inpatient rehab (3hours/day) Can patient physically be transported by private vehicle: No   Assistance Recommended at Discharge Intermittent Supervision/Assistance  Patient can return home with the following A little help with walking and/or transfers;Assist for transportation;Help with stairs or ramp for entrance;Assistance with cooking/housework   Equipment Recommendations  Other (comment) (defer to post acute)    Recommendations for Other Services Rehab consult     Precautions / Restrictions  Precautions Precautions: Fall Restrictions Weight Bearing Restrictions: No     Mobility  Bed Mobility Overal bed mobility: Needs Assistance Bed Mobility: Supine to Sit, Sit to Supine   Sidelying to sit: Min guard, HOB elevated Supine to sit: Min assist     General bed mobility comments: extra time needed with + use of bedrails, min a to return BLEs to bed    Transfers Overall transfer level: Needs assistance Equipment used: Rolling walker (2 wheels) Transfers: Sit to/from Stand Sit to Stand: Min assist           General transfer comment: min assist to power up and steady on rise    Ambulation/Gait Ambulation/Gait assistance: Mod assist Gait Distance (Feet): 20 Feet Assistive device: Rolling walker (2 wheels) Gait Pattern/deviations: Step-through pattern, Step-to pattern, Decreased stride length, Ataxic Gait velocity: decr   Pre-gait activities: standing marching x20 General Gait Details: ataxic gait with pt advancing RLE from hip with vaulting like movement with noted diffculty controling descend to floor, mod assist throughout to steady RW and maintain balance with x2 LOB. cues throughout for RW proximity   Stairs             Wheelchair Mobility    Modified Rankin (Stroke Patients Only) Modified Rankin (Stroke Patients Only) Pre-Morbid Rankin Score: No significant disability Modified Rankin: Moderate disability     Balance Overall balance assessment: Needs assistance Sitting-balance support: Bilateral upper extremity supported Sitting balance-Leahy Scale: Good Sitting balance - Comments: Used bil UE to support trunk while sitting.   Standing balance support: Bilateral upper extremity supported Standing balance-Leahy Scale: Poor Standing balance comment: heavy reliance on BUE support  Cognition Arousal/Alertness: Awake/alert Behavior During Therapy: WFL for tasks assessed/performed Overall Cognitive Status:  Within Functional Limits for tasks assessed                                          Exercises Other Exercises Other Exercises: supine SLR x5 ea side, supine SLR to target x5 ea side    General Comments General comments (skin integrity, edema, etc.): VSS on RA, pt with mild c/o dizziness on intital stand, however resolving with increased time on feet      Pertinent Vitals/Pain Pain Assessment Pain Assessment: No/denies pain    Home Living                          Prior Function            PT Goals (current goals can now be found in the care plan section) Acute Rehab PT Goals Patient Stated Goal: To get stronger and feel better PT Goal Formulation: With patient Time For Goal Achievement: 03/14/22 Progress towards PT goals: Progressing toward goals    Frequency    Min 3X/week      PT Plan Discharge plan needs to be updated    Co-evaluation              AM-PAC PT "6 Clicks" Mobility   Outcome Measure  Help needed turning from your back to your side while in a flat bed without using bedrails?: None Help needed moving from lying on your back to sitting on the side of a flat bed without using bedrails?: A Little Help needed moving to and from a bed to a chair (including a wheelchair)?: A Little Help needed standing up from a chair using your arms (e.g., wheelchair or bedside chair)?: A Lot Help needed to walk in hospital room?: A Lot Help needed climbing 3-5 steps with a railing? : Total 6 Click Score: 15    End of Session Equipment Utilized During Treatment: Gait belt Activity Tolerance: Patient tolerated treatment well Patient left: in bed;with call bell/phone within reach;with bed alarm set Nurse Communication: Mobility status PT Visit Diagnosis: Unsteadiness on feet (R26.81);Other abnormalities of gait and mobility (R26.89)     Time: 9767-3419 PT Time Calculation (min) (ACUTE ONLY): 17 min  Charges:  $Gait Training: 8-22  mins                     Shanequa Whitenight R. PTA Acute Rehabilitation Services Office: [place] 03/03/2022, 11:32 AM

## 2022-03-03 NOTE — Progress Notes (Signed)
Mobility Specialist: Progress Note   03/03/22 1435  Mobility  Activity Ambulated with assistance in room  Level of Assistance Moderate assist, patient does 50-74%  Assistive Device Front wheel walker  Distance Ambulated (ft) 30 ft  Activity Response Tolerated well  Mobility Referral Yes  $Mobility charge 1 Mobility   Pt received sitting on BSC and agreeable to mobility once finished. ModA to stand and minA during ambulation for balance secondary to RLE ataxia. C/o mild dizziness during ambulation, otherwise asymptomatic. Pt back to bed after session with call bell and phone at her side. Bed alarm is on.   Michelle Brooks Mobility Specialist Please contact via SecureChat or Rehab office at [phone]

## 2022-03-03 NOTE — Care Management Important Message (Signed)
Important Message  Patient Details  Name: Michelle Brooks MRN: [id number] Date of Birth: 11/11/1965   Medicare Important Message Given:  Yes     Orbie Pyo 03/03/2022, 3:01 PM

## 2022-03-03 NOTE — Progress Notes (Signed)
Occupational Therapy Treatment Patient Details Name: Michelle Brooks MRN: [id number] DOB: Sep 06, 1965 Today's Date: 03/03/2022   History of present illness Pt is a 57 y.o. female with nausea, vomiting, difficult swallowing. MRI revealed small acute infarcts in the left lateral medulla, right pontine  medullary junction, and left cerebellum. 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   OT comments  Salimah is making incremental progress and reports that she was able to transfer to the toilet and complete toileting needs with min A this date and admits her RLE is still "hard to control." Pt noted to have a peanut butter sandwich and stated she was "doctoring" herself, and also reported chocking "several times" while eating it. Re-educated on diet orders and signs posted in the room. RN notified. Pt only agreeable to bed level session. She tolerated the exercises listed below, and completed bed mobility with up to min A. OT to follow acutely. D/c recommendations updated to AIR for maximal functional progress.    Recommendations for follow up therapy are one component of a multi-disciplinary discharge planning process, led by the attending physician.  Recommendations may be updated based on patient status, additional functional criteria and insurance authorization.    Follow Up Recommendations  Acute inpatient rehab (3hours/day)     Assistance Recommended at Discharge Frequent or constant Supervision/Assistance  Patient can return home with the following  A lot of help with bathing/dressing/bathroom;A little help with walking and/or transfers;Assistance with cooking/housework;Direct supervision/assist for medications management;Direct supervision/assist for financial management;Assist for transportation;Help with stairs or ramp for entrance   Equipment Recommendations  None recommended by OT       Precautions / Restrictions  Precautions Precautions: Fall Restrictions Weight Bearing Restrictions: No       Mobility Bed Mobility Overal bed mobility: Needs Assistance Bed Mobility: Supine to Sit, Sit to Supine   Sidelying to sit: Min guard, HOB elevated Supine to sit: Min assist          Transfers                   General transfer comment: pt declined     Balance Overall balance assessment: Needs assistance Sitting-balance support: Bilateral upper extremity supported Sitting balance-Leahy Scale: Good                                     ADL either performed or assessed with clinical judgement   ADL Overall ADL's : Needs assistance/impaired Eating/Feeding: Supervision/ safety Eating/Feeding Details (indicate cue type and reason): supervision A required for adherence to diet orders Grooming: Set up;Sitting                               Functional mobility during ADLs: Minimal assistance;+2 for physical assistance General ADL Comments: limited session due to pt wanting to finish her sandwich despite it being against diet orders - education provided.    Extremity/Trunk Assessment Upper Extremity Assessment Upper Extremity Assessment: Generalized weakness   Lower Extremity Assessment Lower Extremity Assessment: Defer to PT evaluation        Vision   Vision Assessment?: Vision impaired- to be further tested in functional context;Yes Eye Alignment: Within Functional Limits Tracking/Visual Pursuits: Decreased smoothness of horizontal tracking Saccades: Decreased speed of saccadic movement Convergence: Within functional limits Additional Comments: low vision of L eye  Perception Perception Perception: Not tested   Praxis Praxis Praxis: Not tested    Cognition Arousal/Alertness: Awake/alert Behavior During Therapy: WFL for tasks assessed/performed Overall Cognitive Status: Within Functional Limits for tasks assessed                                  General Comments: pt on full liquid diet but eating a peanut butter sandwich upon arrival. Upon questioning pt said "I am doctoring myself up" and admitted to "choking" several times.        Exercises Exercises: Other exercises Other Exercises Other Exercises: BUE punshes towards ceiling x10, alternating Other Exercises: BLE leg raises x10 alternating     Pertinent Vitals/ Pain       Pain Assessment Pain Assessment: No/denies pain   Frequency  Min 2X/week        Progress Toward Goals  OT Goals(current goals can now be found in the care plan section)  Progress towards OT goals: Progressing toward goals  Acute Rehab OT Goals Patient Stated Goal: to go home OT Goal Formulation: With patient Time For Goal Achievement: 03/14/22 Potential to Achieve Goals: Good ADL Goals Pt Will Perform Grooming: with modified independence;standing Pt Will Perform Lower Body Dressing: with modified independence;sit to/from stand Pt Will Transfer to Toilet: with modified independence;regular height toilet;ambulating Additional ADL Goal #1: Pt will demonstrate use of visual compensatory techniques during ADL  Plan Discharge plan needs to be updated       AM-PAC OT "6 Clicks" Daily Activity     Outcome Measure   Help from another person eating meals?: A Little Help from another person taking care of personal grooming?: A Little Help from another person toileting, which includes using toliet, bedpan, or urinal?: A Little Help from another person bathing (including washing, rinsing, drying)?: A Little Help from another person to put on and taking off regular upper body clothing?: A Little Help from another person to put on and taking off regular lower body clothing?: A Little 6 Click Score: 18    End of Session    OT Visit Diagnosis: Unsteadiness on feet (R26.81);Muscle weakness (generalized) (M62.81);Other abnormalities of gait and mobility (R26.89);Dizziness and giddiness  (R42);Low vision, both eyes (H54.2)   Activity Tolerance Patient tolerated treatment well   Patient Left in bed;with call bell/phone within reach;with bed alarm set   Nurse Communication Mobility status (pt eating soilds)        Time: 3016-0109 OT Time Calculation (min): 18 min  Charges: OT General Charges $OT Visit: 1 Visit OT Treatments $Therapeutic Activity: 8-22 mins    Elliot Cousin 03/03/2022, 4:52 PM

## 2022-03-04 DIAGNOSIS — I10 Essential (primary) hypertension: Secondary | ICD-10-CM | POA: Diagnosis not present

## 2022-03-04 DIAGNOSIS — E1165 Type 2 diabetes mellitus with hyperglycemia: Secondary | ICD-10-CM | POA: Diagnosis not present

## 2022-03-04 DIAGNOSIS — I471 Supraventricular tachycardia, unspecified: Secondary | ICD-10-CM | POA: Diagnosis not present

## 2022-03-04 DIAGNOSIS — I639 Cerebral infarction, unspecified: Secondary | ICD-10-CM | POA: Diagnosis not present

## 2022-03-04 LAB — CBC
HCT: 45 % (ref 36.0–46.0)
Hemoglobin: 15.2 g/dL — ABNORMAL HIGH (ref 12.0–15.0)
MCH: 28.7 pg (ref 26.0–34.0)
MCHC: 33.8 g/dL (ref 30.0–36.0)
MCV: 85.1 fL (ref 80.0–100.0)
Platelets: 278 10*3/uL (ref 150–400)
RBC: 5.29 MIL/uL — ABNORMAL HIGH (ref 3.87–5.11)
RDW: 13 % (ref 11.5–15.5)
WBC: 9.5 10*3/uL (ref 4.0–10.5)
nRBC: 0 % (ref 0.0–0.2)

## 2022-03-04 LAB — BASIC METABOLIC PANEL
Anion gap: 10 (ref 5–15)
BUN: 12 mg/dL (ref 6–20)
CO2: 23 mmol/L (ref 22–32)
Calcium: 8.9 mg/dL (ref 8.9–10.3)
Chloride: 104 mmol/L (ref 98–111)
Creatinine, Ser: 1.27 mg/dL — ABNORMAL HIGH (ref 0.44–1.00)
GFR, Estimated: 50 mL/min — ABNORMAL LOW (ref >60)
Glucose, Bld: 204 mg/dL — ABNORMAL HIGH (ref 70–99)
Potassium: 3.7 mmol/L (ref 3.5–5.1)
Sodium: 137 mmol/L (ref 135–145)

## 2022-03-04 LAB — MAGNESIUM: Magnesium: 2 mg/dL (ref 1.7–2.4)

## 2022-03-04 LAB — GLUCOSE, CAPILLARY
Glucose-Capillary: 150 mg/dL — ABNORMAL HIGH (ref 70–99)
Glucose-Capillary: 158 mg/dL — ABNORMAL HIGH (ref 70–99)
Glucose-Capillary: 133 mg/dL — ABNORMAL HIGH (ref 70–99)
Glucose-Capillary: 169 mg/dL — ABNORMAL HIGH (ref 70–99)

## 2022-03-04 MED ORDER — POTASSIUM CHLORIDE 10 MEQ/100ML IV SOLN
10.0000 meq | INTRAVENOUS | Status: AC
  Administered 2022-03-04 (×4): 10 meq via INTRAVENOUS
  Filled 2022-03-04 (×3): qty 100

## 2022-03-04 MED ORDER — ENSURE ENLIVE PO LIQD
237.0000 mL | Freq: Three times a day (TID) | ORAL | Status: DC
  Administered 2022-03-05: 237 mL via ORAL

## 2022-03-04 NOTE — Progress Notes (Signed)
PT Cancellation Note  Patient Details Name: Michelle Brooks MRN: [id number] DOB: 01/02/1966   Cancelled Treatment:    Reason Eval/Treat Not Completed: (P) Patient declined, no reason specified, pt has declined all mobility x2  despite education and encouragement (attempted at 10:23, re-attempted in "a couple of hours" per pt request at 12:47). Will check back as schedule allows to continue with PT POC.  Audry Riles. PTA Acute Rehabilitation Services Office: [place] 03/04/2022, 12:50 PM

## 2022-03-04 NOTE — Progress Notes (Signed)
[facility] PROGRESS NOTE   Michelle Brooks NWG:[id number] DOB: September 05, 1965 DOA: 02/26/2022  PCP: Nolene Ebbs, MD  Brief History/Interval Summary:  57 y.o. female with medical history significant of chronic back pain, DM, HTN, prior CVA, and HLD presenting with emesis.  She has a h/o prior CVAs and these have historically presented with n/v.  She ate a lot of shrimp (no known allergy) and awoke with n/v.  She also noticed some dysphagia, dysarthria, tongue swelling, and eventual left-sided weakness (facial droop as well as LUE/LLE).   She was hospitalized due to concern for acute stroke.    Consultants: Neurology  Procedures: Echocardiogram    Subjective/Interval History: Patient denies any complaints.  Slept well overnight.  Looking forward to her breakfast.  Assessment/Plan:  Acute stroke MRI confirmed stroke. Patient was seen by neurology. Patient to be on aspirin and Plavix for 3 months followed by Plavix alone.  Unable to swallow medications so getting aspirin through suppository.  Plavix and statin on hold. LDL is 118.  Patient was on pravastatin prior to admission.  Changed over to atorvastatin. HbA1c 8.7. Echocardiogram shows normal systolic function with grade 1 diastolic dysfunction. Initially skilled nursing facility was recommended.  Now CIR is recommended to which the patient is agreeable. Patient is stable from neurological standpoint.  Continues to have issues with dysphagia as discussed below.  Oropharyngeal dysphagia Continues to have dysphagia.  Speech therapy continues to follow.  Seems to be tolerating full liquid diet.  Speech therapy continues to follow. Unable to swallow medications.   Hopefully we can avoid feeding tubes.    SVT Noted to have SVT yesterday evening.  Narrow complex tachycardia which is regular.  Probably sinus tachycardia.  No concern for A-fib at this time.  TSH was normal.  Recently done echocardiogram shows normal systolic  function with grade 1 diastolic dysfunction.  No significant valvular abnormalities noted. Magnesium is 2.0.  Potassium 3.7.  Will give potassium supplementation intravenously. Patient converted to normal rhythm after she was given metoprolol.  Will place her on oral metoprolol once she is cleared for oral medication intake.  Leave her on IV metoprolol as needed for now.  Elevated creatinine Creatinine is stable.  Likely has some element of chronic kidney disease.  Renal function is stable.  Monitor urine output.  Questionable allergic reaction Patient had evidence for angioedema at the time of admission.  Likely associated with shrimp intake recently. She was given Benadryl and Pepcid with improvement. Appears to have resolved.  Dysphagia appears to be primarily secondary to her stroke rather than angioedema.  Essential hypertension Allowing permissive hypertension. ARB on hold.  Diabetes mellitus type 2, uncontrolled with hyperglycemia HbA1c 8.7.  Her home medicines are currently on hold.  Currently just on SSI.  Obesity Estimated body mass index is 38.01 kg/m as calculated from the following:   Height as of this encounter: 5\' 8"  (1.727 m).   Weight as of this encounter: 113.4 kg.  DVT Prophylaxis: Lovenox Code Status: Full code Family Communication: Discussed patient Disposition Plan: CIR   Status is: Inpatient Remains inpatient appropriate because: Acute stroke      Medications: Scheduled:  aspirin  300 mg Rectal Daily   [START ON 03/05/2022] atorvastatin  80 mg Oral Daily   [START ON 03/05/2022] clopidogrel  75 mg Oral Daily   enoxaparin (LOVENOX) injection  0.5 mg/kg Subcutaneous Q24H   feeding supplement  1 Container Oral Q24H   feeding supplement (KATE FARMS STANDARD 1.4)  325  mL Oral Daily   insulin aspart  0-15 Units Subcutaneous TID WC   insulin aspart  0-5 Units Subcutaneous QHS   Continuous:  famotidine (PEPCID) IV 20 mg (03/03/22 2130)   PRN:[DISCONTINUED]  acetaminophen **OR** acetaminophen (TYLENOL) oral liquid 160 mg/5 mL **OR** acetaminophen, diphenhydrAMINE, metoprolol tartrate, ondansetron (ZOFRAN) IV  Antibiotics: Anti-infectives (From admission, onward)    None       Objective:  Vital Signs  Vitals:   03/03/22 2119 03/04/22 0041 03/04/22 0500 03/04/22 0838  BP: (!) 162/103 (!) 126/101 113/81 (!) 148/92  Pulse: 83 (!) 118 80 85  Resp:  18 18 17   Temp:  98.2 F (36.8 C) 98 F (36.7 C) 98.6 F (37 C)  TempSrc:  Oral Oral Oral  SpO2: 97% 98% 100% 97%  Weight:      Height:        Intake/Output Summary (Last 24 hours) at 03/04/2022 0933 Last data filed at 03/04/2022 0536 Gross per 24 hour  Intake --  Output 1800 ml  Net -1800 ml    Filed Weights   02/26/22 1157  Weight: 113.4 kg    General appearance: Awake alert.  In no distress Resp: Clear to auscultation bilaterally.  Normal effort Cardio: S1-S2 is normal regular.  No S3-S4.  No rubs murmurs or bruit GI: Abdomen is soft.  Nontender nondistended.  Bowel sounds are present normal.  No masses organomegaly Extremities: No edema.     Lab Results:  Data Reviewed: I have personally reviewed following labs and reports of the imaging studies  CBC: Recent Labs  Lab 02/26/22 1220 03/01/22 0635 03/04/22 0743  WBC 10.4 9.8 9.5  HGB 14.8 14.2 15.2*  HCT 47.0* 41.3 45.0  MCV 89.7 84.1 85.1  PLT 288 252 278     Basic Metabolic Panel: Recent Labs  Lab 02/26/22 1220 03/01/22 0635 03/01/22 0642 03/02/22 0339 03/03/22 0323 03/04/22 0743  NA 139 135  --  137 137 137  K 3.8 3.5  --  3.5 3.7 3.7  CL 106 103  --  105 106 104  CO2 19* 23  --  23 22 23   GLUCOSE 164* 151*  --  125* 124* 204*  BUN 14 10  --  12 11 12   CREATININE 1.09* 1.09*  --  1.26* 1.14* 1.27*  CALCIUM 9.4 8.8*  --  8.9 8.9 8.9  MG  --   --  1.9 2.1  --  2.0     GFR: Estimated Creatinine Clearance: 65.4 mL/min (A) (by C-G formula based on SCr of 1.27 mg/dL (H)).  Liver Function  Tests: Recent Labs  Lab 02/26/22 1220  AST 18  ALT 18  ALKPHOS 93  BILITOT 0.2*  PROT 7.2  ALBUMIN 3.8     Recent Labs  Lab 02/26/22 1220  LIPASE 40     Coagulation Profile: Recent Labs  Lab 02/27/22 0134  INR 1.0     CBG: Recent Labs  Lab 03/03/22 0613 03/03/22 1133 03/03/22 1620 03/03/22 2137 03/04/22 0615  GLUCAP 116* 170* 101* 143* 150*     Radiology Studies: No results found.     LOS: 5 days   Leven Hoel [employer] on [email or web address]  03/04/2022, 9:33 AM

## 2022-03-04 NOTE — Progress Notes (Addendum)
Inpatient Rehab Coordinator Note:  I met with patient at bedside to discuss CIR recommendations and goals/expectations of CIR stay.  We reviewed 3 hrs/day of therapy, physician follow up, and average length of stay 2 weeks (dependent upon progress) with goals of supervision.  She reports she resides at home with a s/o and a nephew and will have one or both of them at home 24/7 when she returns.  We discussed need for insurance prior auth and I can start that process today.  I will also touch base with patient's daughter, per her request.    13:36: Attempted to call both of patients daughters, her significant other, and her nephew. Malikah [phone]) was a wrong number, Hermera [phone]) was no longer in service, and [name] [phone]) was a wrong number.  I was able to reach her nephew at the phone number she provided and he confirms that he and [name] will be able to provide 24/7 supervision, if needed, at discharge.  I was also able to reach pt's brother, Broadus John, who confirms the same.  Neither had contact info for pt's daughters.  I will start insurance auth request today and follow for potential admit pending approval and bed availability.    Shann Medal, PT, DPT Admissions Coordinator [phone] 03/04/22  1:23 PM

## 2022-03-04 NOTE — Progress Notes (Signed)
Mobility Specialist: Progress Note   03/04/22 1627  Mobility  Activity Ambulated with assistance in room  Level of Assistance Moderate assist, patient does 50-74%  Assistive Device Front wheel walker  Distance Ambulated (ft) 60 ft  Activity Response Tolerated well  Mobility Referral Yes  $Mobility charge 1 Mobility   Pt received in the bed, reluctant but agreeable to mobility. MinA with bed mobility and modA to stand. Unsteady with RLE ataxia, no overt LOB. Pt to [facility] per request, BM and void successful. Assisted with pericare and then back to bed. Pt has call bell and phone at her side. Bed alarm is on.   Sandoval Jensen [place] Mobility Specialist Please contact via SecureChat or Rehab office at [phone]

## 2022-03-04 NOTE — Progress Notes (Signed)
Speech Language Pathology Treatment: Dysphagia  Patient Details Name: Michelle Brooks MRN: [id number] DOB: [birth year] Today's Date: 03/04/2022 Time: 6195-0932 SLP Time Calculation (min) (ACUTE ONLY): 13 min  Assessment / Plan / Recommendation Clinical Impression  Initially recommended pt not have grits, puree or jello on full liquid diet however she stated she was able to eat grits this morning without difficulty. She also had jello which she was able to let melt without significant globus sensation or coughing. She said she was on her second cup of coffee this am which she was observed to drink without s/s aspiration. Pt also drank room temp water from bottle and coughed once due to verbalizing immediately after swallow but cough was strong, productive and she orally suctioned. She declined to allow therapist to observe her with a more puree texture such as applesauce or pudding stating "I've never liked those textures but I can handle grits.". Encouraged her to take larger sips as the volume helps open her PES and facilitates transit into esophagus.   She performed her CTAR (chin tuck against resistance) exercises with ball provided Sunday to help open the pharyngoesophageal segment for 4 sets of 10 seconds with cues to push chin down hard. Encouraged to continue this exercise min of 3 times a day. She feels like it is helping since she can now eat grits.   ADDENDUM: As therapist documenting, OT noted that pt was eating a peanut butter sandwich yesterday that significant other had brought. SLP will return to room and advise her not to eat consistencies other than grits and puree at this time.    HPI HPI: Michelle Brooks is a 57 y.o. female with medical history significant of chronic back pain, DM, HTN, prior CVA, and HLD presenting with emesis.  She has a h/o prior CVAs and these have historically presented with n/v.  She ate a lot of shrimp (no known allergy) and awoke yesterday AM with n/v.  She also  noticed some dysphagia, dysarthria, tongue swelling, and eventual left-sided weakness (facial droop as well as LUE/LLE).   Her symptoms have progressed some while in the ER.  The patient appears to be more concerned about possible allergic reaction as the cause for her difficulty swallowing. AFter initial assessment MRI showed multiple acute infarcts involving left lateral medulla, right pontomedullary junction and left cerebellum.      SLP Plan  Continue with current plan of care      Recommendations for follow up therapy are one component of a multi-disciplinary discharge planning process, led by the attending physician.  Recommendations may be updated based on patient status, additional functional criteria and insurance authorization.    Recommendations  Diet recommendations: Thin liquid (full liquid, allow grits) Liquids provided via: Cup;Straw Medication Administration: Via alternative means Supervision: Patient able to self feed;Intermittent supervision to cue for compensatory strategies Compensations: Slow rate;Other (Comment) (larger sips to help open PES) Postural Changes and/or Swallow Maneuvers: Seated upright 90 degrees                Oral Care Recommendations: Oral care BID Follow Up Recommendations: Acute inpatient rehab (3hours/day) Assistance recommended at discharge: Frequent or constant Supervision/Assistance SLP Visit Diagnosis: Dysphagia, unspecified (R13.10);Dysarthria and anarthria (R47.1);Attention and concentration deficit;Cognitive communication deficit (R41.841) Attention and concentration deficit following: Cerebral infarction Plan: Continue with current plan of care           Michelle Brooks  03/04/2022, 9:27 AM

## 2022-03-04 NOTE — Progress Notes (Signed)
Brief Nutrition Note  New consult received for supplement recommendations as pt is still on a full liquid diet and with poor PO intake.   Met with pt in room, states she is lactose intolerant and can't drink ensure. Added D[employer] [place] yesterday after discussion with RN about poor PO intake for an additional source of kcal and one without dairy (despite ensure products being lactose free) and noted both supplements at bedside unopened. Pt reports that she wants food. Discussed with pt that she has not been cleared for PO intake and that SLP will determine when she is safe to have more advanced textures. Discussed the importance of preserving her lean muscle mass and that prolonged periods of inadequate protein and kcal will result in muscle loss.   Discussed ensure being lactose free and encouraged pt to think of it as part of her medical therapy and consume them in between her meals to give her the best chance she can of adequate nutrition. Pt hesitant, but agreeable to try.   Will also add ordering assist to ensure pt gets all foods she wants. Will follow-up as planned  Ranell Patrick, RD, LDN Clinical Dietitian RD pager # available in [place]  After hours/weekend pager # available in [facility]

## 2022-03-05 DIAGNOSIS — E1165 Type 2 diabetes mellitus with hyperglycemia: Secondary | ICD-10-CM | POA: Diagnosis not present

## 2022-03-05 DIAGNOSIS — I639 Cerebral infarction, unspecified: Secondary | ICD-10-CM | POA: Diagnosis not present

## 2022-03-05 DIAGNOSIS — I10 Essential (primary) hypertension: Secondary | ICD-10-CM | POA: Diagnosis not present

## 2022-03-05 DIAGNOSIS — I471 Supraventricular tachycardia, unspecified: Secondary | ICD-10-CM | POA: Diagnosis not present

## 2022-03-05 LAB — GLUCOSE, CAPILLARY
Glucose-Capillary: 211 mg/dL — ABNORMAL HIGH (ref 70–99)
Glucose-Capillary: 189 mg/dL — ABNORMAL HIGH (ref 70–99)
Glucose-Capillary: 216 mg/dL — ABNORMAL HIGH (ref 70–99)
Glucose-Capillary: 179 mg/dL — ABNORMAL HIGH (ref 70–99)

## 2022-03-05 LAB — BASIC METABOLIC PANEL
Anion gap: 9 (ref 5–15)
BUN: 9 mg/dL (ref 6–20)
CO2: 21 mmol/L — ABNORMAL LOW (ref 22–32)
Calcium: 8.9 mg/dL (ref 8.9–10.3)
Chloride: 105 mmol/L (ref 98–111)
Creatinine, Ser: 1.28 mg/dL — ABNORMAL HIGH (ref 0.44–1.00)
GFR, Estimated: 49 mL/min — ABNORMAL LOW (ref >60)
Glucose, Bld: 188 mg/dL — ABNORMAL HIGH (ref 70–99)
Potassium: 4.2 mmol/L (ref 3.5–5.1)
Sodium: 135 mmol/L (ref 135–145)

## 2022-03-05 MED ORDER — METOPROLOL TARTRATE 5 MG/5ML IV SOLN
5.0000 mg | Freq: Four times a day (QID) | INTRAVENOUS | Status: DC | PRN
  Administered 2022-03-05 (×2): 5 mg via INTRAVENOUS
  Filled 2022-03-05 (×2): qty 5

## 2022-03-05 MED ORDER — LIDOCAINE 5 % EX PTCH
1.0000 | MEDICATED_PATCH | CUTANEOUS | Status: DC
  Administered 2022-03-05 – 2022-03-06 (×2): 1 via TRANSDERMAL
  Filled 2022-03-05 (×2): qty 1

## 2022-03-05 MED ORDER — METOPROLOL TARTRATE 5 MG/5ML IV SOLN
5.0000 mg | Freq: Three times a day (TID) | INTRAVENOUS | Status: DC
  Administered 2022-03-05 – 2022-03-06 (×3): 5 mg via INTRAVENOUS
  Filled 2022-03-05 (×3): qty 5

## 2022-03-05 NOTE — Progress Notes (Signed)
Mobility Specialist: Progress Note   03/05/22 1559  Mobility  Activity Refused mobility   Pt refused mobility stating she has already walked today. Despite encouragement pt still refusing. Will f/u as able.   [place] Mobility Specialist Please contact via SecureChat or Rehab office at [phone]

## 2022-03-05 NOTE — Progress Notes (Signed)
[facility] PROGRESS NOTE   Michelle Brooks BLT:[id number] DOB: [birth year] DOA: 02/26/2022  PCP: Nolene Ebbs, MD  Brief History/Interval Summary:  58 y.o. female with medical history significant of chronic back pain, DM, HTN, prior CVA, and HLD presenting with emesis.  She has a h/o prior CVAs and these have historically presented with n/v.  She ate a lot of shrimp (no known allergy) and awoke with n/v.  She also noticed some dysphagia, dysarthria, tongue swelling, and eventual left-sided weakness (facial droop as well as LUE/LLE).   She was hospitalized due to concern for acute stroke.    Consultants: Neurology  Procedures: Echocardiogram    Subjective/Interval History: No complaints offered.  Specifically denies any palpitations, chest pain or shortness of breath.    Assessment/Plan:  Acute stroke MRI confirmed stroke. Patient was seen by neurology. Patient to be on aspirin and Plavix for 3 months followed by Plavix alone.  Unable to swallow medications so getting aspirin through suppository.  Plavix and statin on hold. LDL is 118.  Patient was on pravastatin prior to admission.  Changed over to atorvastatin. HbA1c 8.7. Echocardiogram shows normal systolic function with grade 1 diastolic dysfunction. Initially skilled nursing facility was recommended.  Now CIR is recommended to which the patient is agreeable. Patient is stable from neurological standpoint.  Continues to have issues with dysphagia as discussed below.  Oropharyngeal dysphagia Continues to have dysphagia.  Speech therapy continues to follow.  Seems to be tolerating full liquid diet.  Speech therapy continues to follow. Unable to swallow medications.   Hopefully we can avoid feeding tubes.    SVT/sinus tachycardia Patient was noted to have SVT on 1/22.  She was given metoprolol and converted to sinus tach and then to sinus rhythm.   No concern for A-fib at this time.  TSH was normal.  Recently done  echocardiogram shows normal systolic function with grade 1 diastolic dysfunction.  No significant valvular abnormalities noted. Potassium this morning is 4.2.  Magnesium was 2.0 yesterday. Again noted to be tachycardic overnight.  Given metoprolol with some improvement.  EKG repeated and shows sinus tachycardia.  Continue to monitor on telemetry.  Ideally we would like to give her oral metoprolol.  Will for now place her on scheduled IV metoprolol. Will place her on oral metoprolol once she is cleared for oral medication intake.  Leave her on IV metoprolol as needed for now.  Chronic kidney disease stage IIIa Creatinine is stable.  Likely has some element of chronic kidney disease.  Renal function is stable.  Monitor urine output.  Questionable allergic reaction Patient had evidence for angioedema at the time of admission.  Likely associated with shrimp intake recently. She was given Benadryl and Pepcid with improvement. Appears to have resolved.  Dysphagia appears to be primarily secondary to her stroke rather than angioedema.  Essential hypertension Blood pressure is stable for the most part.  Occasional high readings noted.  Initially allowed permissive hypertension.  Diabetes mellitus type 2, uncontrolled with hyperglycemia HbA1c 8.7.  Her home medicines are currently on hold.  Currently just on SSI.  CBGs are stable for the most part.  Obesity Estimated body mass index is 38.01 kg/m as calculated from the following:   Height as of this encounter: 5\' 8"  (1.727 m).   Weight as of this encounter: 113.4 kg.  DVT Prophylaxis: Lovenox Code Status: Full code Family Communication: Discussed patient Disposition Plan: CIR   Status is: Inpatient Remains inpatient appropriate because: Acute stroke  Medications: Scheduled:  aspirin  300 mg Rectal Daily   enoxaparin (LOVENOX) injection  0.5 mg/kg Subcutaneous Q24H   feeding supplement  237 mL Oral TID BM   feeding supplement (KATE  FARMS STANDARD 1.4)  325 mL Oral Daily   insulin aspart  0-15 Units Subcutaneous TID WC   insulin aspart  0-5 Units Subcutaneous QHS   Continuous:  famotidine (PEPCID) IV 20 mg (03/04/22 2216)   PRN:[DISCONTINUED] acetaminophen **OR** acetaminophen (TYLENOL) oral liquid 160 mg/5 mL **OR** acetaminophen, diphenhydrAMINE, metoprolol tartrate, ondansetron (ZOFRAN) IV  Antibiotics: Anti-infectives (From admission, onward)    None       Objective:  Vital Signs  Vitals:   03/04/22 2056 03/05/22 0256 03/05/22 0753 03/05/22 0801  BP: (!) 146/84 (!) 135/95 (!) 142/85 117/87  Pulse:   (!) 122 (!) 107  Resp: 16 16 18 18   Temp: 99.2 F (37.3 C) 98.6 F (37 C) 98.3 F (36.8 C) 98.3 F (36.8 C)  TempSrc: Oral Oral Oral   SpO2: 99% 98%    Weight:      Height:        Intake/Output Summary (Last 24 hours) at 03/05/2022 0850 Last data filed at 03/05/2022 0813 Gross per 24 hour  Intake 1370 ml  Output 600 ml  Net 770 ml    Filed Weights   02/26/22 1157  Weight: 113.4 kg    General appearance: Awake alert.  In no distress Resp: Clear to auscultation bilaterally.  Normal effort Cardio: S1-S2 is tachycardic regular GI: Abdomen is soft.  Nontender nondistended.  Bowel sounds are present normal.  No masses organomegaly Extremities: No edema.   Lab Results:  Data Reviewed: I have personally reviewed following labs and reports of the imaging studies  CBC: Recent Labs  Lab 02/26/22 1220 03/01/22 0635 03/04/22 0743  WBC 10.4 9.8 9.5  HGB 14.8 14.2 15.2*  HCT 47.0* 41.3 45.0  MCV 89.7 84.1 85.1  PLT 288 252 278     Basic Metabolic Panel: Recent Labs  Lab 03/01/22 0635 03/01/22 0642 03/02/22 0339 03/03/22 0323 03/04/22 0743 03/05/22 0640  NA 135  --  137 137 137 135  K 3.5  --  3.5 3.7 3.7 4.2  CL 103  --  105 106 104 105  CO2 23  --  23 22 23  21*  GLUCOSE 151*  --  125* 124* 204* 188*  BUN 10  --  12 11 12 9   CREATININE 1.09*  --  1.26* 1.14* 1.27* 1.28*   CALCIUM 8.8*  --  8.9 8.9 8.9 8.9  MG  --  1.9 2.1  --  2.0  --      GFR: Estimated Creatinine Clearance: 64.8 mL/min (A) (by C-G formula based on SCr of 1.28 mg/dL (H)).  Liver Function Tests: Recent Labs  Lab 02/26/22 1220  AST 18  ALT 18  ALKPHOS 93  BILITOT 0.2*  PROT 7.2  ALBUMIN 3.8     Recent Labs  Lab 02/26/22 1220  LIPASE 40     Coagulation Profile: Recent Labs  Lab 02/27/22 0134  INR 1.0     CBG: Recent Labs  Lab 03/04/22 0615 03/04/22 1133 03/04/22 1651 03/04/22 2108 03/05/22 0759  GLUCAP 150* 158* 133* 169* 211*     Radiology Studies: No results found.     LOS: 6 days   Michelle Brooks [employer] on [email or web address]  03/05/2022, 8:50 AM

## 2022-03-05 NOTE — Progress Notes (Signed)
Inpatient Rehab Admissions Coordinator:   Spoke to pt's daughter, Olive Bass, today at [phone] and she agrees with pursuing CIR admission.  Insurance auth pending and I will continue to follow.   Shann Medal, PT, DPT Admissions Coordinator [phone] 03/05/22  10:26 AM

## 2022-03-05 NOTE — Progress Notes (Signed)
   03/05/22 1541  Spiritual Encounters  Type of Visit Attempt (pt unavailable)  Referral source Nurse (RN/NT/LPN)  Reason for visit Routine spiritual support  OnCall Visit No  Spiritual Framework  Presenting Themes Meaning/purpose/sources of inspiration   Patient was sleeping at time of visit.

## 2022-03-05 NOTE — Progress Notes (Signed)
Physical Therapy Treatment Patient Details Name: Michelle Brooks MRN: [id number] DOB: 1966-01-22 Today's Date: 03/05/2022   History of Present Illness Pt is a 57 y.o. female with nausea, vomiting, difficult swallowing. MRI revealed small acute infarcts in the left lateral medulla, right pontine  medullary junction, and left cerebellum. 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    PT Comments    Pt greeted supine in bed and agreeable to session. Pt with c/o L shoulder pain, RN notified with pt needing increased time to complete bed mobility and cues to scoot forward for feet on floor. Session focused on gait with RW for increased activity tolerance. Pt requiring mod assist during gait to steady, with pt continuing to demonstrate ataxia of bil Les with noted L knee hyperextension this date with inability to correct without buckling. Continued education re; importance of continued mobility and benefits and importance of diet adherence for safety with pt and pt spouse verbalizing understanding. Current plan remains appropriate to address deficits and maximize functional independence and decrease caregiver burden. Pt continues to benefit from skilled PT services to progress toward functional mobility goals.     Recommendations for follow up therapy are one component of a multi-disciplinary discharge planning process, led by the attending physician.  Recommendations may be updated based on patient status, additional functional criteria and insurance authorization.  Follow Up Recommendations  Acute inpatient rehab (3hours/day) Can patient physically be transported by private vehicle: No   Assistance Recommended at Discharge Intermittent Supervision/Assistance  Patient can return home with the following A little help with walking and/or transfers;Assist for transportation;Help with stairs or ramp for entrance;Assistance with  cooking/housework   Equipment Recommendations  Other (comment) (defer to post acute)    Recommendations for Other Services       Precautions / Restrictions Precautions Precautions: Fall Restrictions Weight Bearing Restrictions: No     Mobility  Bed Mobility Overal bed mobility: Needs Assistance Bed Mobility: Supine to Sit, Sit to Supine   Sidelying to sit: Min guard, HOB elevated Supine to sit: Min assist     General bed mobility comments: extra time needed with + use of bedrails, min a to return BLEs to bed    Transfers Overall transfer level: Needs assistance Equipment used: Rolling walker (2 wheels) Transfers: Sit to/from Stand Sit to Stand: Min assist           General transfer comment: min assist to power up    Ambulation/Gait Ambulation/Gait assistance: Mod assist Gait Distance (Feet): 80 Feet Assistive device: Rolling walker (2 wheels) Gait Pattern/deviations: Step-through pattern, Step-to pattern, Decreased stride length, Ataxic Gait velocity: decr     General Gait Details: ataxic gait with pt advancing RLE from hip with vaulting like movement with noted diffculty controling descend to floor, hyperextension on L with pt unable to correct without L knee buckling, mod assist throughout to steady RW and maintain balance, x1 standing recoervy break 2/2 fatigue   Stairs             Wheelchair Mobility    Modified Rankin (Stroke Patients Only) Modified Rankin (Stroke Patients Only) Pre-Morbid Rankin Score: No significant disability Modified Rankin: Moderate disability     Balance Overall balance assessment: Needs assistance Sitting-balance support: Bilateral upper extremity supported Sitting balance-Leahy Scale: Good Sitting balance - Comments: Used bil UE to support trunk while sitting.   Standing balance support: Bilateral upper extremity supported Standing balance-Leahy Scale: Poor Standing balance comment:  heavy reliance on BUE  support, leaning on forearms at sink o brush teeth                            Cognition Arousal/Alertness: Awake/alert Behavior During Therapy: WFL for tasks assessed/performed Overall Cognitive Status: Within Functional Limits for tasks assessed                                 General Comments: pt particapatory throughout        Exercises      General Comments General comments (skin integrity, edema, etc.): VSS on RA      Pertinent Vitals/Pain Pain Assessment Pain Assessment: Faces Faces Pain Scale: Hurts even more Pain Location: L shoulder Pain Descriptors / Indicators: Sore, Tender Pain Intervention(s): Monitored during session, Limited activity within patient's tolerance, Patient requesting pain meds-RN notified    Home Living                          Prior Function            PT Goals (current goals can now be found in the care plan section) Acute Rehab PT Goals PT Goal Formulation: With patient Time For Goal Achievement: 03/14/22 Progress towards PT goals: Progressing toward goals    Frequency    Min 3X/week      PT Plan Discharge plan needs to be updated    Co-evaluation              AM-PAC PT "6 Clicks" Mobility   Outcome Measure  Help needed turning from your back to your side while in a flat bed without using bedrails?: None Help needed moving from lying on your back to sitting on the side of a flat bed without using bedrails?: A Little Help needed moving to and from a bed to a chair (including a wheelchair)?: A Little Help needed standing up from a chair using your arms (e.g., wheelchair or bedside chair)?: A Lot Help needed to walk in hospital room?: A Lot Help needed climbing 3-5 steps with a railing? : Total 6 Click Score: 15    End of Session Equipment Utilized During Treatment: Gait belt Activity Tolerance: Patient tolerated treatment well Patient left: in bed;with call bell/phone within  reach;with bed alarm set;with family/visitor present;with nursing/sitter in room Nurse Communication: Mobility status;Patient requests pain meds PT Visit Diagnosis: Unsteadiness on feet (R26.81);Other abnormalities of gait and mobility (R26.89)     Time: 6389-3734 PT Time Calculation (min) (ACUTE ONLY): 23 min  Charges:  $Gait Training: 23-37 mins                     Verlin Uher R. PTA Acute Rehabilitation Services Office: [place] 03/05/2022, 1:44 PM

## 2022-03-05 NOTE — Progress Notes (Signed)
   03/05/22 1600  Spiritual Encounters  Referral source Nurse (RN/NT/LPN)  Reason for visit Routine spiritual support  OnCall Visit  (no)  Spiritual Framework  Presenting Themes Meaning/purpose/sources of inspiration (meaningful in)  Values/beliefs Christian  Community/Connection Faith community  Goals  Self/Personal Goals ablility to eat on her own  Clinical Care Goals wants to walk outside room  Interventions  Spiritual Care Interventions Made Compassionate presence;Prayer;Reflective listening;Encouragement  Intervention Outcomes  Outcomes Reduced anxiety  Spiritual Care Plan  Spiritual Care Issues Still Outstanding No further spiritual care needs at this time (see row info)   Prayed with PT, talked/listened to wants/needs PT make will request again if needed

## 2022-03-06 ENCOUNTER — Inpatient Hospital Stay (HOSPITAL_COMMUNITY): Payer: 59

## 2022-03-06 DIAGNOSIS — I10 Essential (primary) hypertension: Secondary | ICD-10-CM | POA: Diagnosis not present

## 2022-03-06 DIAGNOSIS — I639 Cerebral infarction, unspecified: Secondary | ICD-10-CM | POA: Diagnosis not present

## 2022-03-06 DIAGNOSIS — I471 Supraventricular tachycardia, unspecified: Secondary | ICD-10-CM | POA: Diagnosis not present

## 2022-03-06 DIAGNOSIS — E1165 Type 2 diabetes mellitus with hyperglycemia: Secondary | ICD-10-CM | POA: Diagnosis not present

## 2022-03-06 LAB — GLUCOSE, CAPILLARY
Glucose-Capillary: 225 mg/dL — ABNORMAL HIGH (ref 70–99)
Glucose-Capillary: 257 mg/dL — ABNORMAL HIGH (ref 70–99)
Glucose-Capillary: 258 mg/dL — ABNORMAL HIGH (ref 70–99)
Glucose-Capillary: 249 mg/dL — ABNORMAL HIGH (ref 70–99)

## 2022-03-06 MED ORDER — METOPROLOL TARTRATE 25 MG PO TABS
25.0000 mg | ORAL_TABLET | Freq: Two times a day (BID) | ORAL | Status: DC
  Administered 2022-03-06 – 2022-03-07 (×3): 25 mg via ORAL
  Filled 2022-03-06 (×3): qty 1

## 2022-03-06 MED ORDER — ASPIRIN 81 MG PO TBEC: 81.0000 mg | DELAYED_RELEASE_TABLET | Freq: Every day | ORAL | Status: DC

## 2022-03-06 MED ORDER — LACTASE 3000 UNITS PO TABS: 6000.0000 [IU] | ORAL_TABLET | Freq: Three times a day (TID) | ORAL | Status: DC

## 2022-03-06 MED ORDER — ASPIRIN 81 MG PO CHEW
81.0000 mg | CHEWABLE_TABLET | Freq: Every day | ORAL | Status: DC
  Administered 2022-03-06 – 2022-03-07 (×2): 81 mg via ORAL
  Filled 2022-03-06 (×2): qty 1

## 2022-03-06 MED ORDER — ATORVASTATIN CALCIUM 80 MG PO TABS
80.0000 mg | ORAL_TABLET | Freq: Every day | ORAL | Status: DC
  Administered 2022-03-06 – 2022-03-07 (×2): 80 mg via ORAL
  Filled 2022-03-06 (×2): qty 1

## 2022-03-06 MED ORDER — CLOPIDOGREL BISULFATE 75 MG PO TABS
75.0000 mg | ORAL_TABLET | Freq: Every day | ORAL | Status: DC
  Administered 2022-03-06 – 2022-03-07 (×2): 75 mg via ORAL
  Filled 2022-03-06 (×2): qty 1

## 2022-03-06 MED ORDER — DIPHENHYDRAMINE HCL 12.5 MG/5ML PO ELIX
25.0000 mg | ORAL_SOLUTION | Freq: Three times a day (TID) | ORAL | Status: DC | PRN
  Administered 2022-03-06 – 2022-03-07 (×3): 25 mg via ORAL
  Filled 2022-03-06 (×4): qty 10

## 2022-03-06 MED ORDER — FAMOTIDINE 20 MG PO TABS
20.0000 mg | ORAL_TABLET | Freq: Every day | ORAL | Status: DC
  Administered 2022-03-06 – 2022-03-07 (×2): 20 mg via ORAL
  Filled 2022-03-06 (×2): qty 1

## 2022-03-06 NOTE — Plan of Care (Signed)

## 2022-03-06 NOTE — Progress Notes (Signed)
Modified Barium Swallow Progress Note  Patient Details  Name: Michelle Brooks MRN: [id number] Date of Birth: [birth year]  Today's Date: 03/06/2022  Modified Barium Swallow completed.  Full report located under Chart Review in the Imaging Section.  Brief recommendations include the following:  Clinical Impression  Pt's oropharyngeal esophageal swallow function has improved from prior MBS. Most concerning impairment is her decreased pharyngoesophageal function although it has signifcantly improved allowing increase in bolus volume to transit through the PES with each swallow. In addition she is able to produce multiple  dry swallows today which she previously had been unable to do. After initial swallows there residue ranges from moderate-max increasing with thicker viscosities. Strategies to reduce residue were effective including multiple swallows, swallows with thin barium and most significant was a head turn to the left which signifcantly reduced pyriform sinus residue. There was only one flash penetration with initial teaspoon sip of thin and protection and initiation of swallow was adequate. Her tongue base retraction, pharyngeal wall contraction in addition to decreased PES distention and duration contributed to vallecular and pyriform sinus residue. Recommend texture upgrade to Dys 3, continue thin with LEFT HEAD TURN with solids, multiple swallows and alternate liquids and solids and crush meds.   Swallow Evaluation Recommendations       SLP Diet Recommendations: Dysphagia 3 (Mech soft) solids;Thin liquid   Liquid Administration via: Straw;Cup   Medication Administration: Crushed with puree   Supervision: Patient able to self feed;Intermittent supervision to cue for compensatory strategies   Compensations: Slow rate;Small sips/bites;Follow solids with liquid;Multiple dry swallows after each bite/sip (HEAD TURN TO LEFT)   Postural Changes: Seated upright at 90 degrees   Oral Care  Recommendations: Oral care BID        [place] Siren 03/06/2022,10:37 AM

## 2022-03-06 NOTE — Progress Notes (Signed)
Mobility Specialist: Progress Note   03/06/22 1456  Mobility  Activity Ambulated with assistance in hallway  Level of Assistance Minimal assist, patient does 75% or more  Assistive Device Front wheel walker  Distance Ambulated (ft) 80 ft  Activity Response Tolerated well  Mobility Referral Yes  $Mobility charge 1 Mobility   Pt received in the bed and agreeable to mobility. Mod I with bed mobility and minA to stand. Distance limited secondary to LUE pain, no rating given. Pt to the sink to brush her teeth after ambulation then sitting EOB. OT present in the room.   Michelle Brooks Mobility Specialist Please contact via SecureChat or Rehab office at [phone]

## 2022-03-06 NOTE — Progress Notes (Signed)
Inpatient Rehab Admissions Coordinator:   I received insurance approval for CIR, but I do not have a bed available for this patient to admit today.  I will continue to follow for admit pending bed availability.   Shann Medal, PT, DPT Admissions Coordinator [phone] 03/06/22  10:09 AM

## 2022-03-06 NOTE — Progress Notes (Signed)
During bedside report patient stated they would refuse all crushed medications in applesauce because it made patient " feel sick, nauseated and my stomach roll" Patient also said without lactaid for lactose intolerance that she cannot try meds crushed in pudding or yogurt. Explained that most meds do not come in liquid form.

## 2022-03-06 NOTE — TOC Progression Note (Signed)
Transition of Care [facility]) - Progression Note    Patient Details  Name: Michelle Brooks MRN: [id number] Date of Birth: Aug 02, 1965  [facility]) CM/SW Contact  Pollie Friar, RN Phone Number: 03/06/2022, 11:41 AM  Clinical Narrative:    Plan for admission to CIR tomorrow.  TOC following.     Barriers to Discharge: Continued Medical Work up  Expected Discharge Plan and Services   Discharge Planning Services: CM Consult   Living arrangements for the past 2 months: Apartment                                       Social Determinants of Health (SDOH) Interventions SDOH Screenings   Food Insecurity: No Food Insecurity (03/03/2022)  Housing: Low Risk  (03/03/2022)  Transportation Needs: No Transportation Needs (03/03/2022)  Utilities: Not At Risk (03/03/2022)  Depression (PHQ2-9): Low Risk  (10/11/2018)  Tobacco Use: Medium Risk (02/26/2022)    Readmission Risk Interventions     No data to display

## 2022-03-06 NOTE — Progress Notes (Signed)
Speech Language Pathology Treatment: Dysphagia  Patient Details Name: Michelle Brooks MRN: [id number] DOB: [birth year] Today's Date: 03/06/2022 Time: 0815-0827 SLP Time Calculation (min) (ACUTE ONLY): 12 min  Assessment / Plan / Recommendation Clinical Impression  Pt seen this morning for dysphagia. She was eating cream of wheat mixed with tomato soup on arrival- had finished but agreeable to finish her bowl. There were no signs of aspiration or effortful swallows, signs of residue and she states she is using her [place] suction much less. She performed her CTAR (chin tuck against resistance) exercise with bowl with it slipping from her neck and given cues to hold it with her hand. Previous MBS was last Friday and recommend repeating today for hopeful advancement of diet textures. She reports feeling like she could handle ground beef mixed with mashed potatoes. MBS is planned for this morning.    HPI HPI: Michelle Brooks is a 57 y.o. female with medical history significant of chronic back pain, DM, HTN, prior CVA, and HLD presenting with emesis.  She has a h/o prior CVAs and these have historically presented with n/v.  She ate a lot of shrimp (no known allergy) and awoke yesterday AM with n/v.  She also noticed some dysphagia, dysarthria, tongue swelling, and eventual left-sided weakness (facial droop as well as LUE/LLE).   Her symptoms have progressed some while in the ER.  The patient appears to be more concerned about possible allergic reaction as the cause for her difficulty swallowing. AFter initial assessment MRI showed multiple acute infarcts involving left lateral medulla, right pontomedullary junction and left cerebellum.      SLP Plan  MBS      Recommendations for follow up therapy are one component of a multi-disciplinary discharge planning process, led by the attending physician.  Recommendations may be updated based on patient status, additional functional criteria and insurance  authorization.    Recommendations  Diet recommendations: Thin liquid (thin liquids, can have some puree) Liquids provided via: Cup;Straw Medication Administration: Via alternative means Supervision: Patient able to self feed Compensations: Slow rate;Other (Comment) (large consecutive boluses) Postural Changes and/or Swallow Maneuvers: Seated upright 90 degrees                Oral Care Recommendations: Oral care BID Follow Up Recommendations:  (TBD) Assistance recommended at discharge: Intermittent Supervision/Assistance SLP Visit Diagnosis: Dysphagia, unspecified (R13.10);Dysarthria and anarthria (R47.1);Attention and concentration deficit;Cognitive communication deficit (R41.841) Attention and concentration deficit following: Cerebral infarction Plan: MBS           Houston Siren  03/06/2022, 8:54 AM

## 2022-03-06 NOTE — Progress Notes (Signed)
Occupational Therapy Treatment Patient Details Name: Michelle Brooks MRN: [id number] DOB: [birth year] Today's Date: 03/06/2022   History of present illness Pt is a 57 y.o. female with nausea, vomiting, difficult swallowing. MRI revealed small acute infarcts in the left lateral medulla, right pontine  medullary junction, and left cerebellum. 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   OT comments  Pt progressing towards established OT goals. Pt with L shoulder pain this session and hesitant to use. Wrist./hand AROM WFL, but elbow flexion to 110 degrees and very reluctant to attempt to move shoulder; tender to touch. RN notified. Pt performing functional mobility into hall with min cues for RW placement. Performing oral care while hunching over sink with min guard A. Pt with complaints of LUE pain throughout. Continues to benefit from skilled OT services to optimize safety and independence in ADL and IADL. Continue to highly recommend multidisciplinary approach at [facility].    Recommendations for follow up therapy are one component of a multi-disciplinary discharge planning process, led by the attending physician.  Recommendations may be updated based on patient status, additional functional criteria and insurance authorization.    Follow Up Recommendations  Acute inpatient rehab (3hours/day)     Assistance Recommended at Discharge Frequent or constant Supervision/Assistance  Patient can return home with the following  A lot of help with bathing/dressing/bathroom;A little help with walking and/or transfers;Assistance with cooking/housework;Direct supervision/assist for medications management;Direct supervision/assist for financial management;Assist for transportation;Help with stairs or ramp for entrance   Equipment Recommendations  None recommended by OT    Recommendations for Other Services      Precautions / Restrictions  Precautions Precautions: Fall Restrictions Weight Bearing Restrictions: No       Mobility Bed Mobility Overal bed mobility: Needs Assistance Bed Mobility: Supine to Sit, Sit to Supine   Sidelying to sit: Min guard, HOB elevated Supine to sit: Min assist     General bed mobility comments: extra time needed with + use of bedrails, min a to return BLEs to bed    Transfers Overall transfer level: Needs assistance Equipment used: Rolling walker (2 wheels) Transfers: Sit to/from Stand Sit to Stand: Min assist           General transfer comment: min assist to power up     Balance Overall balance assessment: Needs assistance Sitting-balance support: Bilateral upper extremity supported Sitting balance-Leahy Scale: Good Sitting balance - Comments: Used bil UE to support trunk while sitting.   Standing balance support: Bilateral upper extremity supported Standing balance-Leahy Scale: Poor Standing balance comment: heavy reliance on BUE support, leaning on forearms at sink o brush teeth                           ADL either performed or assessed with clinical judgement   ADL Overall ADL's : Needs assistance/impaired     Grooming: Min guard;Oral care;Standing Grooming Details (indicate cue type and reason): pt observed to support weight on bent UE hunched over sink. Significantly decr activity tolerance after functional mobility                 Toilet Transfer: Minimal assistance Toilet Transfer Details (indicate cue type and reason): Min A for rise from EOB         Functional mobility during ADLs: Minimal assistance;Rolling walker (2 wheels) General ADL Comments: Decr activity tolerance.    Extremity/Trunk Assessment Upper Extremity Assessment Upper  Extremity Assessment: LUE deficits/detail LUE Deficits / Details: shoulder pain, decreaed pain free AROM at shoulder and elbow. Tender to touch on lateral aspect of shoulder and rhomboids. Reluctant to  bear weight through U. LUE Coordination: decreased gross motor   Lower Extremity Assessment Lower Extremity Assessment: Defer to PT evaluation        Vision   Vision Assessment?: Vision impaired- to be further tested in functional context;Yes Additional Comments: Cont low vision L eye; compensating well during ADL this session   Perception     Praxis      Cognition Arousal/Alertness: Awake/alert Behavior During Therapy: WFL for tasks assessed/performed Overall Cognitive Status: Within Functional Limits for tasks assessed                                 General Comments: Following commands throughout session, able to multitask at times.        Exercises Exercises: Other exercises Other Exercises Other Exercises: LUE AROM below elbow; 10x elbow flexion to 110 degrees    Shoulder Instructions       General Comments VSS on RA    Pertinent Vitals/ Pain       Pain Assessment Pain Assessment: Faces Faces Pain Scale: Hurts whole lot Pain Location: L shoulder Pain Descriptors / Indicators: Discomfort, Tender, Sore Pain Intervention(s): Monitored during session, Limited activity within patient's tolerance  Home Living                                          Prior Functioning/Environment              Frequency  Min 2X/week        Progress Toward Goals  OT Goals(current goals can now be found in the care plan section)  Progress towards OT goals: Progressing toward goals  Acute Rehab OT Goals Patient Stated Goal: go home OT Goal Formulation: With patient Time For Goal Achievement: 03/14/22 Potential to Achieve Goals: Good ADL Goals Pt Will Perform Grooming: with modified independence;standing Pt Will Perform Lower Body Dressing: with modified independence;sit to/from stand Pt Will Transfer to Toilet: with modified independence;regular height toilet;ambulating Additional ADL Goal #1: Pt will demonstrate use of visual  compensatory techniques during ADL  Plan Discharge plan remains appropriate;Frequency remains appropriate    Co-evaluation                 AM-PAC OT "6 Clicks" Daily Activity     Outcome Measure   Help from another person eating meals?: A Little Help from another person taking care of personal grooming?: A Little Help from another person toileting, which includes using toliet, bedpan, or urinal?: A Little Help from another person bathing (including washing, rinsing, drying)?: A Little Help from another person to put on and taking off regular upper body clothing?: A Little Help from another person to put on and taking off regular lower body clothing?: A Little 6 Click Score: 18    End of Session Equipment Utilized During Treatment: Gait belt;Rolling walker (2 wheels)  OT Visit Diagnosis: Unsteadiness on feet (R26.81);Muscle weakness (generalized) (M62.81);Other abnormalities of gait and mobility (R26.89);Dizziness and giddiness (R42);Low vision, both eyes (H54.2)   Activity Tolerance Patient tolerated treatment well   Patient Left in bed;with call bell/phone within reach;with bed alarm set   Nurse Communication Mobility status (L shoulder  pain)        Time: 2956-2130 OT Time Calculation (min): 32 min  Charges: OT General Charges $OT Visit: 1 Visit OT Treatments $Self Care/Home Management : 8-22 mins $Therapeutic Activity: 8-22 mins  Tyler Deis, OTR/L [facility] Acute Rehabilitation Office: [phone]   Michelle Brooks 03/06/2022, 4:02 PM

## 2022-03-06 NOTE — H&P (Signed)
Physical Medicine and Rehabilitation Admission H&P    Chief Complaint  Patient presents with   Emesis  : HPI: Michelle Brooks. Bolding is a 57 year old right-handed female with history of chronic back pain, diabetes mellitus, CKD stage III, obesity with BMI 40.12, hypertension, prior right MCA CVA 2012 with loop recorder insertion maintained on low-dose aspirin, hyperlipidemia, quit smoking 17 years ago.  Per chart review patient lives with boyfriend and nephew.  1 level home with multiple steps to entry.  Reportedly modified independent prior to admission.  Presented 02/26/2022 with nausea/vomiting, left eyelid droop difficulty swallowing and right side weakness.  CT/MRI showed small acute infarcts in the left lateral medulla, right pontine medullary junction and left cerebellum.  Remote infarcts in the right cerebellum and bilateral cerebral cortex.  CT angiogram head and neck in comparison to 2020 CTA, some progression of occlusive intracranial disease, suggestive of moyamoya type process.  Admission chemistries unremarkable except glucose 164 creatinine 1.09, urine drug screen negative, hemoglobin A1c 8.7.  Patient did not receive tPA.  Echocardiogram with ejection fraction of 60 to 65% no wall motion abnormalities grade 1 diastolic dysfunction.  Neurology follow-up placed on low-dose aspirin and Plavix daily x 3 months then Plavix alone.  Her diet has been advanced to mechanical soft thin liquid.  Lovenox added for DVT prophylaxis.  Patient did have episode of SVT/sinus tachycardia 1/22 given metoprolol converted to sinus tach and then to sinus rhythm.  No concern for A-fib at that time.  She was placed on metoprolol.  Therapy evaluations completed due to patient decreased functional mobility/side paresthesias was admitted for a comprehensive rehab program.  Review of Systems  Constitutional:  Negative for chills and fever.  HENT:  Negative for hearing loss.   Eyes:  Positive for blurred vision.  Negative for double vision.  Respiratory:  Negative for cough, shortness of breath and wheezing.   Cardiovascular:  Positive for leg swelling. Negative for chest pain and palpitations.  Gastrointestinal:  Positive for constipation, nausea and vomiting. Negative for heartburn.       GERD  Genitourinary:  Negative for dysuria, flank pain and hematuria.  Musculoskeletal:  Positive for back pain, joint pain and myalgias.  Skin:  Negative for rash.  Neurological:  Positive for sensory change, speech change, weakness and headaches.  All other systems reviewed and are negative.  Past Medical History:  Diagnosis Date   Chronic back pain    Diabetes mellitus type 2 in obese (HCC)    Diabetic retinopathy (HCC)    GERD (gastroesophageal reflux disease)    Headache    History of stroke 03/2010   Right MCA stroke in 03/2010, with MRI also noting subacute strokes in left fronto parietal area - occured in [place] received care at [facility] thought due to uncontrolled blood pressure // No residual deficits // TTE (03/2010) at [facility] - normal LV systolic function, moderate pericardial effusion with 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   HSV-2 infection    Hyperlipidemia LDL goal < 100 04/01/2011   Hypertension    Lumbago    pain in the back   Past Surgical History:  Procedure Laterality Date   BREAST LUMPECTOMY  2002   states benign lymph node removed.   BUBBLE STUDY  08/30/2018   Procedure: BUBBLE STUDY;  Surgeon: Vesta Mixer, MD;  Location: [facility] ENDOSCOPY;  Service: Cardiovascular;;   ENDOMETRIAL BIOPSY  10/04/2020   LOOP RECORDER INSERTION  N/A 08/30/2018   Procedure: LOOP RECORDER INSERTION;  Surgeon: Evans Lance, MD;  Location: [place] CV LAB;  Service: Cardiovascular;  Laterality: N/A;   TEE WITHOUT CARDIOVERSION N/A 08/30/2018   Procedure: TRANSESOPHAGEAL ECHOCARDIOGRAM (TEE);  Surgeon: Acie Fredrickson Wonda Cheng, MD;  Location: [facility]  ENDOSCOPY;  Service: Cardiovascular;  Laterality: N/A;   Family History  Problem Relation Age of Onset   Diabetes Mother    Stroke Mother 64   Diabetes Brother    Diabetes Brother    Cancer Brother 84       unknown type   Hypertension Brother    Social History:  reports that she quit smoking about 17 years ago. Her smoking use included cigarettes. She has a 1.40 pack-year smoking history. She has never used smokeless tobacco. She reports that she does not drink alcohol and does not use drugs. Allergies:  Allergies  Allergen Reactions   Lactose Intolerance (Gi) Diarrhea and Nausea And Vomiting   Latex     Like a burning in area   Medications Prior to Admission  Medication Sig Dispense Refill   aspirin EC 81 MG tablet Take 81 mg by mouth daily.     Dulaglutide 3 MG/0.5ML SOPN Inject 3 mg into the skin once a week. Sunday     insulin lispro protamine-lispro (HUMALOG 50/50) (50-50) 100 UNIT/ML SUSP injection Inject 80 Units into the skin 3 (three) times daily. Up to 60 depending on cbg     JANUVIA 100 MG tablet Take 100 mg by mouth daily.     pravastatin (PRAVACHOL) 80 MG tablet Take 1 tablet (80 mg total) by mouth daily. 30 tablet 5   valsartan (DIOVAN) 160 MG tablet Take 160 mg by mouth daily.     Blood Glucose Monitoring Suppl (ACCU-CHEK AVIVA PLUS) W/DEVICE KIT 1 kit by Does not apply route 3 (three) times daily before meals. 1 kit 0   Blood Glucose Monitoring Suppl (TRUE METRIX METER) DEVI 1 each by Does not apply route 4 (four) times daily -  before meals and at bedtime. 1 Device 0   glucose blood (TRUE METRIX BLOOD GLUCOSE TEST) test strip Use as instructed 100 each 5   TRUEPLUS LANCETS 28G MISC 1 each by Does not apply route 4 (four) times daily -  before meals and at bedtime. 100 each 5      Home: Home Living Family/patient expects to be discharged to:: Private residence Living Arrangements: Spouse/significant other Available Help at Discharge: Family, Available  PRN/intermittently Type of Home: Apartment Home Access: Stairs to enter Technical brewer of Steps: 15 Entrance Stairs-Rails: Left Home Layout: One level Bathroom Shower/Tub: Chiropodist: Handicapped height Bathroom Accessibility: No Home Equipment: Conservation officer, nature (2 wheels), Conservator, museum/gallery (4 wheels), [employer] - single point  Lives With: Family   Functional History: Prior Function Prior Level of Function : Independent/Modified Independent  Functional Status:  Mobility: Bed Mobility Overal bed mobility: Needs Assistance Bed Mobility: Supine to Sit, Sit to Supine Sidelying to sit: Min guard, HOB elevated Supine to sit: Min assist General bed mobility comments: extra time needed with + use of bedrails, min a to return BLEs to bed Transfers Overall transfer level: Needs assistance Equipment used: Rolling walker (2 wheels) Transfers: Sit to/from Stand Sit to Stand: Min assist General transfer comment: min assist to power up Ambulation/Gait Ambulation/Gait assistance: Mod assist Gait Distance (Feet): 80 Feet Assistive device: Rolling walker (2 wheels) Gait Pattern/deviations: Step-through pattern, Step-to pattern, Decreased stride length, Ataxic General  Gait Details: ataxic gait with pt advancing RLE from hip with vaulting like movement with noted diffculty controling descend to floor, hyperextension on L with pt unable to correct without L knee buckling, mod assist throughout to steady RW and maintain balance, x1 standing recoervy break 2/2 fatigue Gait velocity: decr Pre-gait activities: standing marching x20 Stairs:  (Pt declined further OOB activities due to fatigue)    ADL: ADL Overall ADL's : Needs assistance/impaired Eating/Feeding: Supervision/ safety Eating/Feeding Details (indicate cue type and reason): supervision A required for adherence to diet orders Grooming: Min guard, Oral care, Standing Grooming Details (indicate cue type and  reason): pt observed to support weight on bent UE hunched over sink. Significantly decr activity tolerance after functional mobility Upper Body Bathing: Set up, Sitting Upper Body Dressing : Set up, Sitting Toilet Transfer: Minimal assistance Toilet Transfer Details (indicate cue type and reason): Min A for rise from EOB Functional mobility during ADLs: Minimal assistance, Rolling walker (2 wheels) General ADL Comments: Decr activity tolerance.  Cognition: Cognition Overall Cognitive Status: Within Functional Limits for tasks assessed Arousal/Alertness: Awake/alert Orientation Level: Oriented X4 Year: 2024 Month: January Day of Week: Correct Attention: Sustained Sustained Attention: Impaired Sustained Attention Impairment: Verbal basic, Functional basic Memory: Impaired Memory Impairment: Retrieval deficit, Decreased short term memory Decreased Short Term Memory: Verbal basic, Functional basic Awareness: Appears intact Behaviors: Restless, Perseveration Comments: DTA cognition Cognition Arousal/Alertness: Awake/alert Behavior During Therapy: WFL for tasks assessed/performed Overall Cognitive Status: Within Functional Limits for tasks assessed General Comments: Following commands throughout session, able to multitask at times.  Physical Exam: Blood pressure (!) 167/83, pulse 77, temperature 98 F (36.7 C), temperature source Oral, resp. rate 18, height 5\' 8"  (1.727 m), weight 113.4 kg, last menstrual period 03/30/2016, SpO2 97 %. Physical Exam Constitutional:      Appearance: She is obese.  HENT:     Head: Normocephalic.     Right Ear: External ear normal.     Left Ear: External ear normal.  Eyes:     Extraocular Movements: Extraocular movements intact.     Conjunctiva/sclera: Conjunctivae normal.  Cardiovascular:     Rate and Rhythm: Normal rate and regular rhythm.     Heart sounds: No murmur heard.    No gallop.  Pulmonary:     Effort: Pulmonary effort is normal. No  respiratory distress.     Breath sounds: No wheezing, rhonchi or rales.  Abdominal:     General: Bowel sounds are normal. There is no distension.     Palpations: Abdomen is soft.     Tenderness: There is no abdominal tenderness.  Musculoskeletal:     Cervical back: Normal range of motion.     Comments: Left arm extremely tender to PROM, shoulder seems to be main area of discomfort.   Skin:    General: Skin is warm and dry.  Neurological:     Comments: Pt is alert and oriented x 3. Left lid ptosis, seems to track equally to all fields. Decreased sensation left face. Speech slightly slurred. Normal language. RUE and RLE grossly 4 to 5/5. LUE limited by pain, ?2/5. LLE 4/5. Sensation: decreased discrimination of fine touch LUE but is almost allodynic to basic palpation. LLE sensation grossly intact to P/LT.[name] No resting tone appreciated   Patient is alert.  His eye contact with examiner.  Speech is mildly dysarthric but intelligible.  Provides name and age.  Follows simple commands.  Psychiatric:     Comments: Pt generally pleasant and cooperative  Results for orders placed or performed during the hospital encounter of 02/26/22 (from the past 48 hour(s))  Basic metabolic panel     Status: Abnormal   Collection Time: 03/05/22  6:40 AM  Result Value Ref Range   Sodium 135 135 - 145 mmol/L   Potassium 4.2 3.5 - 5.1 mmol/L   Chloride 105 98 - 111 mmol/L   CO2 21 (L) 22 - 32 mmol/L   Glucose, Bld 188 (H) 70 - 99 mg/dL    Comment: Glucose reference range applies only to samples taken after fasting for at least 8 hours.   BUN 9 6 - 20 mg/dL   Creatinine, Ser 4.541.28 (H) 0.44 - 1.00 mg/dL   Calcium 8.9 8.9 - 09.810.3 mg/dL   GFR, Estimated 49 (L) >60 mL/min    Comment: (NOTE) Calculated using the CKD-EPI Creatinine Equation (2021)    Anion gap 9 5 - 15    Comment: Performed at [facility], [address] [place], [place] [address]  Glucose, capillary     Status: Abnormal   Collection  Time: 03/05/22  7:59 AM  Result Value Ref Range   Glucose-Capillary 211 (H) 70 - 99 mg/dL    Comment: Glucose reference range applies only to samples taken after fasting for at least 8 hours.   Comment 1 Notify RN    Comment 2 Document in Chart   Glucose, capillary     Status: Abnormal   Collection Time: 03/05/22 12:18 PM  Result Value Ref Range   Glucose-Capillary 189 (H) 70 - 99 mg/dL    Comment: Glucose reference range applies only to samples taken after fasting for at least 8 hours.   Comment 1 Notify RN    Comment 2 Document in Chart   Glucose, capillary     Status: Abnormal   Collection Time: 03/05/22  3:53 PM  Result Value Ref Range   Glucose-Capillary 216 (H) 70 - 99 mg/dL    Comment: Glucose reference range applies only to samples taken after fasting for at least 8 hours.   Comment 1 Notify RN    Comment 2 Document in Chart   Glucose, capillary     Status: Abnormal   Collection Time: 03/05/22  8:56 PM  Result Value Ref Range   Glucose-Capillary 179 (H) 70 - 99 mg/dL    Comment: Glucose reference range applies only to samples taken after fasting for at least 8 hours.  Glucose, capillary     Status: Abnormal   Collection Time: 03/06/22  6:04 AM  Result Value Ref Range   Glucose-Capillary 225 (H) 70 - 99 mg/dL    Comment: Glucose reference range applies only to samples taken after fasting for at least 8 hours.  Glucose, capillary     Status: Abnormal   Collection Time: 03/06/22 11:13 AM  Result Value Ref Range   Glucose-Capillary 257 (H) 70 - 99 mg/dL    Comment: Glucose reference range applies only to samples taken after fasting for at least 8 hours.   Comment 1 Notify RN    Comment 2 Document in Chart   Glucose, capillary     Status: Abnormal   Collection Time: 03/06/22  4:38 PM  Result Value Ref Range   Glucose-Capillary 258 (H) 70 - 99 mg/dL    Comment: Glucose reference range applies only to samples taken after fasting for at least 8 hours.   Comment 1 Notify RN     Comment 2 Document in Chart  Glucose, capillary     Status: Abnormal   Collection Time: 03/06/22  9:21 PM  Result Value Ref Range   Glucose-Capillary 249 (H) 70 - 99 mg/dL    Comment: Glucose reference range applies only to samples taken after fasting for at least 8 hours.   Comment 1 Notify RN   Glucose, capillary     Status: Abnormal   Collection Time: 03/07/22  3:49 AM  Result Value Ref Range   Glucose-Capillary 249 (H) 70 - 99 mg/dL    Comment: Glucose reference range applies only to samples taken after fasting for at least 8 hours.   DG Swallowing Func-Speech Pathology  Result Date: 03/06/2022 Table formatting from the original result was not included. Objective Swallowing Evaluation: Type of Study: MBS-Modified Barium Swallow Study  Patient Details Name: JONE CANNISTRA MRN: [id number] Date of Birth: 03/13/1965 Today's Date: 03/06/2022 Time: SLP Start Time (ACUTE ONLY): 0915 -SLP Stop Time (ACUTE ONLY): 0930 SLP Time Calculation (min) (ACUTE ONLY): 15 min Past Medical History: Past Medical History: Diagnosis Date  Chronic back pain   Diabetes mellitus type 2 in obese ([place])   Diabetic retinopathy ([place])   GERD (gastroesophageal reflux disease)   Headache   History of stroke 03/2010  Right MCA stroke in 03/2010, with MRI also noting subacute strokes in left fronto parietal area - occured in [place] received care at [facility] thought due to uncontrolled blood pressure // No residual deficits // TTE (03/2010) at [facility] - normal LV systolic function, moderate pericardial effusion with 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  HSV-2 infection   Hyperlipidemia LDL goal < 100 04/01/2011  Hypertension   Lumbago   pain in the back Past Surgical History: Past Surgical History: Procedure Laterality Date  BREAST LUMPECTOMY  2002  states benign lymph node removed.  BUBBLE STUDY  08/30/2018  Procedure: BUBBLE STUDY;  Surgeon: Thayer Headings, MD;  Location:  [place];  Service: Cardiovascular;;  ENDOMETRIAL BIOPSY  10/04/2020  LOOP RECORDER INSERTION N/A 08/30/2018  Procedure: LOOP RECORDER INSERTION;  Surgeon: Evans Lance, MD;  Location: [place] CV LAB;  Service: Cardiovascular;  Laterality: N/A;  TEE WITHOUT CARDIOVERSION N/A 08/30/2018  Procedure: TRANSESOPHAGEAL ECHOCARDIOGRAM (TEE);  Surgeon: Acie Fredrickson Wonda Cheng, MD;  Location: [facility] ENDOSCOPY;  Service: Cardiovascular;  Laterality: N/A; HPI: XANIYAH DORCH is a 57 y.o. female with medical history significant of chronic back pain, DM, HTN, prior CVA, and HLD presenting with emesis.  She has a h/o prior CVAs and these have historically presented with n/v.  She ate a lot of shrimp (no known allergy) and awoke yesterday AM with n/v.  She also noticed some dysphagia, dysarthria, tongue swelling, and eventual left-sided weakness (facial droop as well as LUE/LLE).   Her symptoms have progressed some while in the ER.  The patient appears to be more concerned about possible allergic reaction as the cause for her difficulty swallowing. AFter initial assessment MRI showed multiple acute infarcts involving left lateral medulla, right pontomedullary junction and left cerebellum.  Subjective: "I can't swallow"  Recommendations for follow up therapy are one component of a multi-disciplinary discharge planning process, led by the attending physician.  Recommendations may be updated based on patient status, additional functional criteria and insurance authorization. Assessment / Plan / Recommendation   03/06/2022  10:16 AM Clinical Impressions Clinical Impression Pt's oropharyngeal esophageal swallow function has improved from prior MBS. Most concerning impairment is her decreased pharyngoesophageal function  although it has signifcantly improved allowing increase in bolus volume to transit through the PES with each swallow. In addition she is able to produce multiple  dry swallows today which she previously had been unable to  do. After initial swallows there residue ranges from moderate-max increasing with thicker viscosities. Strategies to reduce residue were effective including multiple swallows, swallows with thin barium and most significant was a head turn to the left which signifcantly reduced pyriform sinus residue. There was only one flash penetration with initial teaspoon sip of thin and protection and initiation of swallow was adequate. Her tongue base retraction, pharyngeal wall contraction in addition to decreased PES distention and duration contributed to vallecular and pyriform sinus residue. Recommend texture upgrade to Dys 3, continue thin with LEFT HEAD TURN with solids, multiple swallows and alternate liquids and solids and crush meds. 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 Impact on safety and function Mild aspiration risk     03/06/2022  10:16 AM Treatment Recommendations Treatment Recommendations Therapy as outlined in treatment plan below     03/06/2022  10:16 AM Prognosis Prognosis for Safe Diet Advancement Good Barriers to Reach Goals Severity of deficits   03/06/2022  10:16 AM Diet Recommendations SLP Diet Recommendations Dysphagia 3 (Mech soft) solids;Thin liquid Liquid Administration via Straw;Cup Medication Administration Crushed with puree Compensations Slow rate;Small sips/bites;Follow solids with liquid;Multiple dry swallows after each bite/sip Postural Changes Seated upright at 90 degrees     03/06/2022  10:16 AM Other Recommendations Oral Care Recommendations Oral care BID Follow Up Recommendations Acute inpatient rehab (3hours/day) Functional Status Assessment Patient has had a recent decline in their functional status and demonstrates the ability to make significant improvements in function in a reasonable and predictable amount of time.   03/06/2022  10:16 AM Frequency and Duration  Speech Therapy Frequency (ACUTE  ONLY) min 2x/week Treatment Duration 2 weeks     03/06/2022  10:16 AM Oral Phase Oral Phase [facility]    03/06/2022  10:16 AM Pharyngeal Phase Pharyngeal Phase Impaired Pharyngeal- Nectar Teaspoon Pharyngeal residue - valleculae;Pharyngeal residue - pyriform;Pharyngeal residue - cp segment;Reduced pharyngeal peristalsis;Reduced tongue base retraction Pharyngeal- Nectar Cup NT Pharyngeal- Thin Teaspoon Penetration/Aspiration during swallow;Pharyngeal residue - pyriform;Reduced pharyngeal peristalsis;Reduced tongue base retraction;Pharyngeal residue - cp segment;Pharyngeal residue - valleculae Pharyngeal Material enters airway, remains ABOVE vocal cords then ejected out Pharyngeal- Thin Cup Pharyngeal residue - cp segment;Pharyngeal residue - valleculae;Pharyngeal residue - pyriform;Reduced pharyngeal peristalsis;Reduced tongue base retraction Pharyngeal- Thin Straw Reduced pharyngeal peristalsis;Reduced tongue base retraction;Pharyngeal residue - pyriform;Pharyngeal residue - valleculae;Pharyngeal residue - cp segment Pharyngeal- Puree Reduced pharyngeal peristalsis;Reduced airway/laryngeal closure;Pharyngeal residue - pyriform;Pharyngeal residue - valleculae;Pharyngeal residue - cp segment Pharyngeal- Regular Pharyngeal residue - cp segment;Pharyngeal residue - valleculae;Pharyngeal residue - pyriform;Reduced pharyngeal peristalsis;Reduced tongue base retraction    03/06/2022  10:16 AM Cervical Esophageal Phase  Cervical Esophageal Phase -- Cervical Esophageal Comment -- Houston Siren 03/06/2022, 10:37 AM                        Blood pressure (!) 167/83, pulse 77, temperature 98 F (36.7 C), temperature source Oral, resp. rate 18, height 5\' 8"  (1.727 m), weight 113.4 kg, last menstrual period 03/30/2016, SpO2 97 %.  Medical Problem List and Plan: 1. 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  Antithrombotics: -DVT/anticoagulation:  Pharmaceutical: Lovenox  -antiplatelet therapy: Aspirin 81 mg daily and Plavix 75 mg daily x 3 months then Plavix alone 3. Severe left upper extremity/shoulder pain: likely hyperalgesia related to left lateral medullary infarct -check left shoulder xray -trial of gabapentin 100mg  tid -voltaren gel for shoulder -Lidoderm patch  -ROM activities with therapy 4. Mood/Behavior/Sleep: Provide emotional support  -antipsychotic agents: N/A 5. Neuropsych/cognition: This patient is capable of making decisions on her own behalf. 6. Skin/Wound Care: Routine skin checks 7. Fluids/Electrolytes/Nutrition: Routine in and outs with follow-up chemistries 8.  Dysphagia.  Diet advanced to mechanical soft thin liquids.   -tolerating presently -advance per speech therapy 9.  Hyperlipidemia.  Lipitor 10.  Diabetes mellitus.  Hemoglobin A1c 8.7.  Currently on SSI.  Prior to admission patient on DULAGLUTIDE 3 mg every Sunday and HUMALOG 50/50 80 units 3 times daily,JANUVIA 100 mg daily.  Resume as needed 11.  Hypertension/SVT.  Lopressor 25 mg twice daily.  Monitor with increased mobility 12.  Obesity.  BMI 40.12.  Dietary follow-up 13.  CKD stage III.  Latest creatinine 1.28.  Follow-up chemistries    Cathlyn Parsons, PA-C 03/07/2022

## 2022-03-06 NOTE — Progress Notes (Addendum)
Nutrition Follow-up  DOCUMENTATION CODES:  Obesity unspecified  INTERVENTION:  Continue current diet as ordered per SLP Encourage PO intake Discontinue supplements, pt will not consume them  NUTRITION DIAGNOSIS:  Inadequate oral intake related to dysphagia as evidenced by  (inability to tolerate solid food). - remains applicable  GOAL:  Patient will meet greater than or equal to 90% of their needs - progressing  MONITOR:   PO intake, Diet advancement, Supplement acceptance  REASON FOR ASSESSMENT:  Consult Poor PO  ASSESSMENT:  Pt with hx of Type 2 DM, HTN, HLD, GERD, and 3 prior CVAs presented to ED with N/V, dysphagia, and left sided weakness. Imaging showed multiple acute infarcts involving left lateral medulla, right pontomedullary junction and left cerebellum.   1/19 - MBS, full liquids, no solids foods 1/25 - DYS 3/ thin liquids  Pt resting in bed at the time of assessment. Had repeat MBS today and was able to have diet advanced. Pt excited to be able to have real food again and states she ate all of her lunch except for a few bites of broccoli. Reports that having the ordering assistance with meals has also been helpful. Pt reports that she has still not tried the nutrition supplements, not interested in them. Will discontinue.   Nutritionally Relevant Medications: Scheduled Meds:  feeding supplement  237 mL Oral TID BM   feeding supplement (KATE FARMS STANDARD 1.4)  325 mL Oral Daily   insulin aspart  0-15 Units Subcutaneous TID WC   insulin aspart  0-5 Units Subcutaneous QHS   PRN Meds: diphenhydrAMINE, ondansetron  Labs Reviewed: CBG ranges from 179-257 mg/dL over the last 24 hours  NUTRITION - FOCUSED PHYSICAL EXAM: Flowsheet Row Most Recent Value  Orbital Region No depletion  Upper Arm Region No depletion  Thoracic and Lumbar Region No depletion  Buccal Region No depletion  Temple Region No depletion  Clavicle Bone Region No depletion  Clavicle and  Acromion Bone Region No depletion  Scapular Bone Region No depletion  Dorsal Hand No depletion  Patellar Region No depletion  Anterior Thigh Region No depletion  Posterior Calf Region No depletion  Edema (RD Assessment) None  Hair Reviewed  Eyes Reviewed  Mouth Reviewed  Skin Reviewed  Nails Reviewed    Diet Order:   Diet Order             DIET DYS 3 Room service appropriate? Yes with Assist; Fluid consistency: Thin  Diet effective now                   EDUCATION NEEDS:  No education needs have been identified at this time  Skin:  Skin Assessment: Reviewed RN Assessment  Last BM:  1/23  Height:  Ht Readings from Last 1 Encounters:  02/26/22 5\' 8"  (1.727 m)    Weight:  Wt Readings from Last 1 Encounters:  02/26/22 113.4 kg    Ideal Body Weight:  63.6 kg  BMI:  Body mass index is 38.01 kg/m.  Estimated Nutritional Needs:  Kcal:  1900-2100 kcal/d Protein:  90-115g/d Fluid:  >/=2L/d    Ranell Patrick, RD, LDN Clinical Dietitian RD pager # available in [facility]  After hours/weekend pager # available in [facility]

## 2022-03-06 NOTE — Progress Notes (Signed)
[facility] PROGRESS NOTE   Michelle Brooks OIN:[id number] DOB: September 05, 1965 DOA: 02/26/2022  PCP: Nolene Ebbs, MD  Brief History/Interval Summary:  57 y.o. female with medical history significant of chronic back pain, DM, HTN, prior CVA, and HLD presenting with emesis.  She has a h/o prior CVAs and these have historically presented with n/v.  She ate a lot of shrimp (no known allergy) and awoke with n/v.  She also noticed some dysphagia, dysarthria, tongue swelling, and eventual left-sided weakness (facial droop as well as LUE/LLE).   She was hospitalized due to concern for acute stroke.    Consultants: Neurology  Procedures: Echocardiogram    Subjective/Interval History: Looking forward to advancement in her diet.  Denies any complaints.  Specifically no chest pain or shortness of breath.     Assessment/Plan:  Acute stroke MRI confirmed stroke. Patient was seen by neurology. Patient to be on aspirin and Plavix for 3 months followed by Plavix alone.  Unable to swallow medications so getting aspirin through suppository.  Plavix and statin on hold. LDL is 118.  Patient was on pravastatin prior to admission.  Changed over to atorvastatin. HbA1c 8.7. Echocardiogram shows normal systolic function with grade 1 diastolic dysfunction. Initially skilled nursing facility was recommended.  Now CIR is recommended to which the patient is agreeable. Patient is stable from neurological standpoint.  Continues to have issues with dysphagia as discussed below.  Oropharyngeal dysphagia Continues to have dysphagia.  Speech therapy continues to follow.  Seems to be tolerating full liquid diet.  Speech therapy continues to follow. Not cleared to take medications by mouth.  Speech therapy to reevaluate today.   Hopefully we can avoid feeding tubes.    SVT/sinus tachycardia Patient was noted to have SVT on 1/22.  She was given metoprolol and converted to sinus tach and then to sinus rhythm.    No concern for A-fib at this time.  TSH was normal.  Recently done echocardiogram shows normal systolic function with grade 1 diastolic dysfunction.  No significant valvular abnormalities noted. Current tachycardia on 1/24.  Placed on scheduled metoprolol intravenously.  Heart rate has been better controlled.  Maintaining sinus rhythm. Will recheck electrolytes tomorrow. Will change her to oral metoprolol once she is cleared for oral medication intake.   Chronic kidney disease stage IIIa Creatinine is stable.  Likely has some element of chronic kidney disease.  Renal function is stable.  Monitor urine output.  Questionable allergic reaction Patient had evidence for angioedema at the time of admission.  Likely associated with shrimp intake recently. She was given Benadryl and Pepcid with improvement. Appears to have resolved.  Dysphagia appears to be primarily secondary to her stroke rather than angioedema.  Essential hypertension Blood pressure is stable for the most part.  Occasional high readings noted.  Initially allowed permissive hypertension. Currently on intravenous metoprolol as discussed above.  Diabetes mellitus type 2, uncontrolled with hyperglycemia HbA1c 8.7.  Her home medicines are currently on hold.  Currently just on SSI.  CBGs are stable for the most part.  Obesity Estimated body mass index is 38.01 kg/m as calculated from the following:   Height as of this encounter: 5\' 8"  (1.727 m).   Weight as of this encounter: 113.4 kg.  DVT Prophylaxis: Lovenox Code Status: Full code Family Communication: Discussed patient Disposition Plan: CIR   Status is: Inpatient Remains inpatient appropriate because: Acute stroke      Medications: Scheduled:  aspirin  300 mg Rectal Daily  enoxaparin (LOVENOX) injection  0.5 mg/kg Subcutaneous Q24H   feeding supplement  237 mL Oral TID BM   feeding supplement (KATE FARMS STANDARD 1.4)  325 mL Oral Daily   insulin aspart  0-15  Units Subcutaneous TID WC   insulin aspart  0-5 Units Subcutaneous QHS   lidocaine  1 patch Transdermal Q24H   metoprolol tartrate  5 mg Intravenous Q8H   Continuous:  famotidine (PEPCID) IV Stopped (03/05/22 2152)   PRN:[DISCONTINUED] acetaminophen **OR** acetaminophen (TYLENOL) oral liquid 160 mg/5 mL **OR** acetaminophen, diphenhydrAMINE, metoprolol tartrate, ondansetron (ZOFRAN) IV  Antibiotics: Anti-infectives (From admission, onward)    None       Objective:  Vital Signs  Vitals:   03/05/22 2050 03/06/22 0038 03/06/22 0416 03/06/22 0745  BP: (!) 181/95 (!) 170/105 (!) 153/94 (!) 155/90  Pulse: 78 73 88 84  Resp: 16 18 18 19   Temp: 98.4 F (36.9 C) 98.9 F (37.2 C) 98.9 F (37.2 C) 99.8 F (37.7 C)  TempSrc: Oral Oral Oral Oral  SpO2: 100% 98% 97% 100%  Weight:      Height:        Intake/Output Summary (Last 24 hours) at 03/06/2022 0943 Last data filed at 03/06/2022 0617 Gross per 24 hour  Intake 502 ml  Output 950 ml  Net -448 ml    Filed Weights   02/26/22 1157  Weight: 113.4 kg    General appearance: Awake alert.  In no distress Resp: Clear to auscultation bilaterally.  Normal effort Cardio: S1-S2 is normal regular.  No S3-S4.  No rubs murmurs or bruit GI: Abdomen is soft.  Nontender nondistended.  Bowel sounds are present normal.  No masses organomegaly Extremities: No edema.   Lab Results:  Data Reviewed: I have personally reviewed following labs and reports of the imaging studies  CBC: Recent Labs  Lab 03/01/22 0635 03/04/22 0743  WBC 9.8 9.5  HGB 14.2 15.2*  HCT 41.3 45.0  MCV 84.1 85.1  PLT 252 278     Basic Metabolic Panel: Recent Labs  Lab 03/01/22 0635 03/01/22 0642 03/02/22 0339 03/03/22 0323 03/04/22 0743 03/05/22 0640  NA 135  --  137 137 137 135  K 3.5  --  3.5 3.7 3.7 4.2  CL 103  --  105 106 104 105  CO2 23  --  23 22 23  21*  GLUCOSE 151*  --  125* 124* 204* 188*  BUN 10  --  12 11 12 9   CREATININE 1.09*  --   1.26* 1.14* 1.27* 1.28*  CALCIUM 8.8*  --  8.9 8.9 8.9 8.9  MG  --  1.9 2.1  --  2.0  --      GFR: Estimated Creatinine Clearance: 64.8 mL/min (A) (by C-G formula based on SCr of 1.28 mg/dL (H)).   CBG: Recent Labs  Lab 03/05/22 0759 03/05/22 1218 03/05/22 1553 03/05/22 2056 03/06/22 0604  GLUCAP 211* 189* 216* 179* 225*     Radiology Studies: No results found.     LOS: 7 days   Carie Kapuscinski [employer] on [email or web address]  03/06/2022, 9:43 AM

## 2022-03-07 ENCOUNTER — Other Ambulatory Visit: Payer: Self-pay

## 2022-03-07 ENCOUNTER — Encounter (HOSPITAL_COMMUNITY): Payer: Self-pay | Admitting: Physical Medicine & Rehabilitation

## 2022-03-07 ENCOUNTER — Inpatient Hospital Stay (HOSPITAL_COMMUNITY): Payer: 59

## 2022-03-07 ENCOUNTER — Inpatient Hospital Stay (HOSPITAL_COMMUNITY)
Admission: RE | Admit: 2022-03-07 | Discharge: 2022-03-15 | DRG: 092 | Disposition: A | Discharge status: Home Health | Payer: 59 | Source: Intra-hospital | Attending: Physical Medicine & Rehabilitation | Admitting: Physical Medicine & Rehabilitation

## 2022-03-07 DIAGNOSIS — Z91040 Latex allergy status: Secondary | ICD-10-CM | POA: Diagnosis not present

## 2022-03-07 DIAGNOSIS — N1832 Chronic kidney disease, stage 3b: Secondary | ICD-10-CM | POA: Diagnosis present

## 2022-03-07 DIAGNOSIS — E1165 Type 2 diabetes mellitus with hyperglycemia: Secondary | ICD-10-CM | POA: Diagnosis not present

## 2022-03-07 DIAGNOSIS — N182 Chronic kidney disease, stage 2 (mild): Secondary | ICD-10-CM | POA: Diagnosis not present

## 2022-03-07 DIAGNOSIS — E1169 Type 2 diabetes mellitus with other specified complication: Secondary | ICD-10-CM

## 2022-03-07 DIAGNOSIS — G8929 Other chronic pain: Secondary | ICD-10-CM | POA: Diagnosis present

## 2022-03-07 DIAGNOSIS — Z809 Family history of malignant neoplasm, unspecified: Secondary | ICD-10-CM

## 2022-03-07 DIAGNOSIS — K5900 Constipation, unspecified: Secondary | ICD-10-CM | POA: Diagnosis present

## 2022-03-07 DIAGNOSIS — R5381 Other malaise: Secondary | ICD-10-CM | POA: Diagnosis present

## 2022-03-07 DIAGNOSIS — M545 Low back pain, unspecified: Secondary | ICD-10-CM | POA: Diagnosis present

## 2022-03-07 DIAGNOSIS — Z7982 Long term (current) use of aspirin: Secondary | ICD-10-CM

## 2022-03-07 DIAGNOSIS — E11319 Type 2 diabetes mellitus with unspecified diabetic retinopathy without macular edema: Secondary | ICD-10-CM | POA: Diagnosis present

## 2022-03-07 DIAGNOSIS — Z8249 Family history of ischemic heart disease and other diseases of the circulatory system: Secondary | ICD-10-CM | POA: Diagnosis not present

## 2022-03-07 DIAGNOSIS — I471 Supraventricular tachycardia, unspecified: Secondary | ICD-10-CM | POA: Diagnosis present

## 2022-03-07 DIAGNOSIS — I10 Essential (primary) hypertension: Secondary | ICD-10-CM

## 2022-03-07 DIAGNOSIS — Z833 Family history of diabetes mellitus: Secondary | ICD-10-CM

## 2022-03-07 DIAGNOSIS — Z95828 Presence of other vascular implants and grafts: Secondary | ICD-10-CM

## 2022-03-07 DIAGNOSIS — I6350 Cerebral infarction due to unspecified occlusion or stenosis of unspecified cerebral artery: Secondary | ICD-10-CM | POA: Diagnosis not present

## 2022-03-07 DIAGNOSIS — E785 Hyperlipidemia, unspecified: Secondary | ICD-10-CM | POA: Diagnosis present

## 2022-03-07 DIAGNOSIS — Z6838 Body mass index (BMI) 38.0-38.9, adult: Secondary | ICD-10-CM

## 2022-03-07 DIAGNOSIS — I69391 Dysphagia following cerebral infarction: Secondary | ICD-10-CM

## 2022-03-07 DIAGNOSIS — Z823 Family history of stroke: Secondary | ICD-10-CM

## 2022-03-07 DIAGNOSIS — Z7984 Long term (current) use of oral hypoglycemic drugs: Secondary | ICD-10-CM

## 2022-03-07 DIAGNOSIS — I6389 Other cerebral infarction: Secondary | ICD-10-CM

## 2022-03-07 DIAGNOSIS — Z79899 Other long term (current) drug therapy: Secondary | ICD-10-CM

## 2022-03-07 DIAGNOSIS — Z6841 Body Mass Index (BMI) 40.0 and over, adult: Secondary | ICD-10-CM

## 2022-03-07 DIAGNOSIS — E669 Obesity, unspecified: Secondary | ICD-10-CM | POA: Diagnosis present

## 2022-03-07 DIAGNOSIS — R2689 Other abnormalities of gait and mobility: Principal | ICD-10-CM | POA: Diagnosis present

## 2022-03-07 DIAGNOSIS — E1122 Type 2 diabetes mellitus with diabetic chronic kidney disease: Secondary | ICD-10-CM | POA: Diagnosis present

## 2022-03-07 DIAGNOSIS — Z87891 Personal history of nicotine dependence: Secondary | ICD-10-CM

## 2022-03-07 DIAGNOSIS — I639 Cerebral infarction, unspecified: Secondary | ICD-10-CM | POA: Diagnosis not present

## 2022-03-07 DIAGNOSIS — Z794 Long term (current) use of insulin: Secondary | ICD-10-CM

## 2022-03-07 DIAGNOSIS — M25512 Pain in left shoulder: Secondary | ICD-10-CM | POA: Diagnosis present

## 2022-03-07 DIAGNOSIS — E739 Lactose intolerance, unspecified: Secondary | ICD-10-CM | POA: Diagnosis present

## 2022-03-07 DIAGNOSIS — R1310 Dysphagia, unspecified: Secondary | ICD-10-CM | POA: Diagnosis present

## 2022-03-07 DIAGNOSIS — K219 Gastro-esophageal reflux disease without esophagitis: Secondary | ICD-10-CM | POA: Diagnosis present

## 2022-03-07 DIAGNOSIS — I129 Hypertensive chronic kidney disease with stage 1 through stage 4 chronic kidney disease, or unspecified chronic kidney disease: Secondary | ICD-10-CM | POA: Diagnosis present

## 2022-03-07 DIAGNOSIS — Z7985 Long-term (current) use of injectable non-insulin antidiabetic drugs: Secondary | ICD-10-CM

## 2022-03-07 DIAGNOSIS — M792 Neuralgia and neuritis, unspecified: Secondary | ICD-10-CM

## 2022-03-07 DIAGNOSIS — K5901 Slow transit constipation: Secondary | ICD-10-CM | POA: Diagnosis not present

## 2022-03-07 LAB — CBC
HCT: 42.1 % (ref 36.0–46.0)
Hemoglobin: 14.2 g/dL (ref 12.0–15.0)
MCH: 28.6 pg (ref 26.0–34.0)
MCHC: 33.7 g/dL (ref 30.0–36.0)
MCV: 84.9 fL (ref 80.0–100.0)
Platelets: 270 10*3/uL (ref 150–400)
RBC: 4.96 MIL/uL (ref 3.87–5.11)
RDW: 12.9 % (ref 11.5–15.5)
WBC: 9.3 10*3/uL (ref 4.0–10.5)
nRBC: 0 % (ref 0.0–0.2)

## 2022-03-07 LAB — BASIC METABOLIC PANEL
Anion gap: 9 (ref 5–15)
BUN: 12 mg/dL (ref 6–20)
CO2: 21 mmol/L — ABNORMAL LOW (ref 22–32)
Calcium: 8.8 mg/dL — ABNORMAL LOW (ref 8.9–10.3)
Chloride: 105 mmol/L (ref 98–111)
Creatinine, Ser: 1.42 mg/dL — ABNORMAL HIGH (ref 0.44–1.00)
GFR, Estimated: 43 mL/min — ABNORMAL LOW (ref >60)
Glucose, Bld: 230 mg/dL — ABNORMAL HIGH (ref 70–99)
Potassium: 4.2 mmol/L (ref 3.5–5.1)
Sodium: 135 mmol/L (ref 135–145)

## 2022-03-07 LAB — GLUCOSE, CAPILLARY
Glucose-Capillary: 249 mg/dL — ABNORMAL HIGH (ref 70–99)
Glucose-Capillary: 230 mg/dL — ABNORMAL HIGH (ref 70–99)
Glucose-Capillary: 345 mg/dL — ABNORMAL HIGH (ref 70–99)
Glucose-Capillary: 219 mg/dL — ABNORMAL HIGH (ref 70–99)
Glucose-Capillary: 335 mg/dL — ABNORMAL HIGH (ref 70–99)

## 2022-03-07 LAB — MAGNESIUM: Magnesium: 2 mg/dL (ref 1.7–2.4)

## 2022-03-07 MED ORDER — ACETAMINOPHEN 325 MG PO TABS: 325.0000 mg | ORAL_TABLET | ORAL | Status: DC | PRN

## 2022-03-07 MED ORDER — ASPIRIN 81 MG PO CHEW
81.0000 mg | CHEWABLE_TABLET | Freq: Every day | ORAL | Status: DC
  Administered 2022-03-08 – 2022-03-15 (×8): 81 mg via ORAL
  Filled 2022-03-07 (×8): qty 1

## 2022-03-07 MED ORDER — LIVING WELL WITH DIABETES BOOK
Freq: Once | Status: AC
  Administered 2022-03-07: 1
  Filled 2022-03-07: qty 1

## 2022-03-07 MED ORDER — DIPHENHYDRAMINE HCL 25 MG PO CAPS
25.0000 mg | ORAL_CAPSULE | Freq: Three times a day (TID) | ORAL | Status: DC | PRN
  Administered 2022-03-07 – 2022-03-15 (×15): 25 mg via ORAL
  Filled 2022-03-07 (×15): qty 1

## 2022-03-07 MED ORDER — INSULIN ASPART 100 UNIT/ML IJ SOLN
0.0000 [IU] | Freq: Three times a day (TID) | INTRAMUSCULAR | Status: DC
  Administered 2022-03-07: 5 [IU] via SUBCUTANEOUS
  Administered 2022-03-08 (×2): 8 [IU] via SUBCUTANEOUS
  Administered 2022-03-08: 5 [IU] via SUBCUTANEOUS
  Administered 2022-03-09 (×2): 8 [IU] via SUBCUTANEOUS

## 2022-03-07 MED ORDER — ENOXAPARIN SODIUM 60 MG/0.6ML IJ SOSY: 57.5000 mg | PREFILLED_SYRINGE | INTRAMUSCULAR | Status: DC

## 2022-03-07 MED ORDER — ENOXAPARIN SODIUM 60 MG/0.6ML IJ SOSY: 0.5000 mg/kg | PREFILLED_SYRINGE | INTRAMUSCULAR | Status: DC

## 2022-03-07 MED ORDER — CLOPIDOGREL BISULFATE 75 MG PO TABS
75.0000 mg | ORAL_TABLET | Freq: Every day | ORAL | Status: DC
  Administered 2022-03-08 – 2022-03-15 (×8): 75 mg via ORAL
  Filled 2022-03-07 (×8): qty 1

## 2022-03-07 MED ORDER — LIDOCAINE 5 % EX PTCH
1.0000 | MEDICATED_PATCH | CUTANEOUS | Status: DC
  Administered 2022-03-07 – 2022-03-10 (×2): 1 via TRANSDERMAL
  Filled 2022-03-07 (×4): qty 1

## 2022-03-07 MED ORDER — FAMOTIDINE 20 MG PO TABS
20.0000 mg | ORAL_TABLET | Freq: Every day | ORAL | Status: DC
  Administered 2022-03-08 – 2022-03-15 (×7): 20 mg via ORAL
  Filled 2022-03-07 (×8): qty 1

## 2022-03-07 MED ORDER — ATORVASTATIN CALCIUM 80 MG PO TABS
80.0000 mg | ORAL_TABLET | Freq: Every day | ORAL | Status: DC
  Administered 2022-03-08 – 2022-03-15 (×7): 80 mg via ORAL
  Filled 2022-03-07 (×8): qty 1

## 2022-03-07 MED ORDER — DICLOFENAC SODIUM 1 % EX GEL
2.0000 g | Freq: Three times a day (TID) | CUTANEOUS | Status: DC
  Administered 2022-03-07 – 2022-03-10 (×4): 2 g via TOPICAL
  Filled 2022-03-07: qty 100

## 2022-03-07 MED ORDER — METOPROLOL TARTRATE 25 MG PO TABS: 25.0000 mg | ORAL_TABLET | Freq: Two times a day (BID) | ORAL | 0 refills | Status: DC

## 2022-03-07 MED ORDER — ATORVASTATIN CALCIUM 80 MG PO TABS: 80.0000 mg | ORAL_TABLET | Freq: Every day | ORAL | 0 refills | Status: DC

## 2022-03-07 MED ORDER — METOPROLOL TARTRATE 25 MG PO TABS
25.0000 mg | ORAL_TABLET | Freq: Two times a day (BID) | ORAL | Status: DC
  Administered 2022-03-07 – 2022-03-08 (×2): 25 mg via ORAL
  Filled 2022-03-07 (×2): qty 1

## 2022-03-07 MED ORDER — CLOPIDOGREL BISULFATE 75 MG PO TABS: 75.0000 mg | ORAL_TABLET | Freq: Every day | ORAL | 2 refills | Status: DC

## 2022-03-07 MED ORDER — ASPIRIN 81 MG PO TBEC: 81.0000 mg | DELAYED_RELEASE_TABLET | Freq: Every day | ORAL | 2 refills | Status: AC

## 2022-03-07 MED ORDER — GABAPENTIN 100 MG PO CAPS
100.0000 mg | ORAL_CAPSULE | Freq: Three times a day (TID) | ORAL | Status: DC
  Administered 2022-03-07 – 2022-03-10 (×9): 100 mg via ORAL
  Filled 2022-03-07 (×17): qty 1

## 2022-03-07 MED ORDER — BLOOD PRESSURE CONTROL BOOK
Freq: Once | Status: AC
  Administered 2022-03-07: 1
  Filled 2022-03-07: qty 1

## 2022-03-07 MED ORDER — TRAMADOL HCL 50 MG PO TABS
50.0000 mg | ORAL_TABLET | Freq: Four times a day (QID) | ORAL | Status: DC | PRN
  Administered 2022-03-07: 50 mg via ORAL
  Filled 2022-03-07 (×2): qty 1

## 2022-03-07 MED ORDER — INSULIN GLARGINE-YFGN 100 UNIT/ML ~~LOC~~ SOLN
10.0000 [IU] | Freq: Every day | SUBCUTANEOUS | Status: DC
  Administered 2022-03-07: 10 [IU] via SUBCUTANEOUS
  Filled 2022-03-07: qty 0.1

## 2022-03-07 MED ORDER — ENOXAPARIN SODIUM 60 MG/0.6ML IJ SOSY
60.0000 mg | PREFILLED_SYRINGE | Freq: Every day | INTRAMUSCULAR | Status: DC
  Administered 2022-03-08 – 2022-03-15 (×8): 60 mg via SUBCUTANEOUS
  Filled 2022-03-07 (×8): qty 0.6

## 2022-03-07 NOTE — Progress Notes (Signed)
PMR Admission Coordinator Pre-Admission Assessment  Patient: Michelle Brooks is an 57 y.o., female MRN: [id number] DOB: 05/02/1965 Height: 5' 8" (172.7 cm) Weight: 113.4 kg  Insurance Information HMO: yes    PPO:      PCP:      IPA:      80/20:      OTHER:  PRIMARY: [employer] Medicare      Policy#: [id number]      Subscriber: pt CM Name: Emmett      Phone#: [phone]     Fax#: [phone] Pre-Cert#: [id number] auth for CIR from [facility] with [facility] with updates due to fax listed above on 03/13/2022      Employer:  Benefits:  Phone #: [phone]     Name:  Eff. Date: 02/10/22     Deduct: $240 ($107.50 met)      Out of Pocket Max: $8850 ($107.50 met)      Life Max: n/a CIR: $1730/admission      SNF: 20 full days Outpatient: 80%     Co-Pay: 20% Home Health: 100%      Co-Pay:  DME: 80%     Co-Pay: 20% Providers:  SECONDARY: Medicaid      Policy#: [id number]     Phone#:   Financial Counselor:       Phone#:   The "Data Collection Information Summary" for patients in Inpatient Rehabilitation Facilities with attached "Privacy Act Statement-Health Care Records" was provided and verbally reviewed with: Patient  Emergency Contact Information Contact Information     Name Relation Home Work Mobile   Hill,Malikah Daughter   [phone]   Blanks,Hermera Daughter   [phone]   Renbarger,Joseph Brother   [phone]   Gall,Anissa Sister   [phone]   nephew,Youseph    [phone]       Current Medical History  Patient Admitting Diagnosis: CVA   History of Present Illness: Rebeca D. Valverde is a 57-year-old right-handed female with history of chronic back pain, diabetes mellitus, CKD stage III, obesity with BMI 40.12, hypertension, prior right MCA CVA 2012 with loop recorder insertion maintained on low-dose aspirin, hyperlipidemia, quit smoking 17 years ago. Presented 02/26/2022 with nausea/vomiting, left eyelid droop difficulty swallowing and right side weakness.  CT/MRI showed  small acute infarcts in the left lateral medulla, right pontine medullary junction and left cerebellum.  Remote infarcts in the right cerebellum and bilateral cerebral cortex.  CT angiogram head and neck in comparison to 2020 CTA, some progression of occlusive intracranial disease, suggestive of moyamoya type process.  Admission chemistries unremarkable except glucose 164 creatinine 1.09, urine drug screen negative, hemoglobin A1c 8.7.  Patient did not receive tPA.  Echocardiogram with ejection fraction of 60 to 65% no wall motion abnormalities grade 1 diastolic dysfunction.  Neurology follow-up placed on low-dose aspirin and Plavix daily x 3 months then Plavix alone.  Her diet has been advanced to mechanical soft thin liquid.  Lovenox added for DVT prophylaxis.  Patient did have episode of SVT/sinus tachycardia 1/22 given metoprolol converted to sinus tach and then to sinus rhythm.  No concern for A-fib at that time.  She was placed on metoprolol.  Therapy evaluations completed and pt was recommended for a comprehensive rehab program.  Complete NIHSS TOTAL: 3  Patient's medical record from [facility] has been reviewed by the rehabilitation admission coordinator and physician.  Past Medical History  Past Medical History:  Diagnosis Date   Chronic back pain    Diabetes mellitus type 2 in obese (HCC)      Diabetic retinopathy (HCC)    GERD (gastroesophageal reflux disease)    Headache    History of stroke 03/2010   Right MCA stroke in 03/2010, with MRI also noting subacute strokes in left fronto parietal area - occured in [place] received care at [facility] thought due to uncontrolled blood pressure // No residual deficits // TTE (03/2010) at [facility] - normal LV systolic function, moderate pericardial effusion with 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   HSV-2 infection    Hyperlipidemia LDL goal < 100 04/01/2011   Hypertension    Lumbago    pain  in the back    Has the patient had major surgery during 100 days prior to admission? No  Family History   family history includes Cancer (age of onset: 44) in her brother; Diabetes in her brother, brother, and mother; Hypertension in her brother; Stroke (age of onset: 51) in her mother.  Current Medications  Current Facility-Administered Medications:    [DISCONTINUED] acetaminophen (TYLENOL) tablet 650 mg, 650 mg, Oral, Q4H PRN **OR** acetaminophen (TYLENOL) 160 MG/5ML solution 650 mg, 650 mg, Per Tube, Q4H PRN, 650 mg at 03/05/22 1335 **OR** acetaminophen (TYLENOL) suppository 650 mg, 650 mg, Rectal, Q4H PRN, Yates, Jennifer, MD   aspirin chewable tablet 81 mg, 81 mg, Oral, Daily, Krishnan, Gokul, MD, 81 mg at 03/06/22 1156   atorvastatin (LIPITOR) tablet 80 mg, 80 mg, Oral, Daily, Krishnan, Gokul, MD, 80 mg at 03/06/22 1156   clopidogrel (PLAVIX) tablet 75 mg, 75 mg, Oral, Daily, Krishnan, Gokul, MD, 75 mg at 03/06/22 1156   diphenhydrAMINE (BENADRYL) 12.5 MG/5ML elixir 25 mg, 25 mg, Oral, Q8H PRN, Krishnan, Gokul, MD, 25 mg at 03/06/22 2344   enoxaparin (LOVENOX) injection 57.5 mg, 0.5 mg/kg, Subcutaneous, Q24H, Reome, Earle J, RPH, 57.5 mg at 03/06/22 1107   famotidine (PEPCID) tablet 20 mg, 20 mg, Oral, Daily, Krishnan, Gokul, MD, 20 mg at 03/06/22 1156   insulin aspart (novoLOG) injection 0-15 Units, 0-15 Units, Subcutaneous, TID WC, Yates, Jennifer, MD, 5 Units at 03/07/22 0644   insulin aspart (novoLOG) injection 0-5 Units, 0-5 Units, Subcutaneous, QHS, Yates, Jennifer, MD, 2 Units at 03/06/22 2210   insulin glargine-yfgn (SEMGLEE) injection 10 Units, 10 Units, Subcutaneous, Daily, Pahwani, Ravi, MD   lidocaine (LIDODERM) 5 % 1 patch, 1 patch, Transdermal, Q24H, Krishnan, Gokul, MD, 1 patch at 03/06/22 1738   metoprolol tartrate (LOPRESSOR) injection 5 mg, 5 mg, Intravenous, Q6H PRN, Gardner, Jared M, DO, 5 mg at 03/05/22 0857   metoprolol tartrate (LOPRESSOR) tablet 25 mg, 25 mg,  Oral, BID, Krishnan, Gokul, MD, 25 mg at 03/06/22 2210   ondansetron (ZOFRAN) injection 4 mg, 4 mg, Intravenous, Q6H PRN, Yates, Jennifer, MD  Patients Current Diet:  Diet Order             DIET DYS 3 Room service appropriate? Yes with Assist; Fluid consistency: Thin  Diet effective now                   Precautions / Restrictions Precautions Precautions: Fall Restrictions Weight Bearing Restrictions: No   Has the patient had 2 or more falls or a fall with injury in the past year? Yes  Prior Activity Level Limited Community (1-2x/wk): independent, driving, out for errands/appointments, no DME at baseline  Prior Functional Level Self Care: Did the patient need help bathing, dressing, using the toilet or eating? Independent  Indoor Mobility: Did the patient need assistance with   walking from room to room (with or without device)? Independent  Stairs: Did the patient need assistance with internal or external stairs (with or without device)? Independent  Functional Cognition: Did the patient need help planning regular tasks such as shopping or remembering to take medications? Independent  Patient Information Are you of Hispanic, Latino/a,or Spanish origin?: A. No, not of Hispanic, Latino/a, or Spanish origin What is your race?: B. Black or African American Do you need or want an interpreter to communicate with a doctor or health care staff?: 0. No  Patient's Response To:  Health Literacy and Transportation Is the patient able to respond to health literacy and transportation needs?: Yes Health Literacy - How often do you need to have someone help you when you read instructions, pamphlets, or other written material from your doctor or pharmacy?: Never In the past 12 months, has lack of transportation kept you from medical appointments or from getting medications?: No In the past 12 months, has lack of transportation kept you from meetings, work, or from getting things needed  for daily living?: No  Home Assistive Devices / Equipment Home Assistive Devices/Equipment: Walker (specify type), Cane (specify quad or straight) (2 wheel, straight cane.) Home Equipment: Rolling Walker (2 wheels), Shower seat, Rollator (4 wheels), Cane - single point  Prior Device Use: Indicate devices/aids used by the patient prior to current illness, exacerbation or injury? None of the above  Current Functional Level Cognition  Arousal/Alertness: Awake/alert Overall Cognitive Status: Within Functional Limits for tasks assessed Orientation Level: Oriented X4 General Comments: Following commands throughout session, able to multitask at times. Attention: Sustained Sustained Attention: Impaired Sustained Attention Impairment: Verbal basic, Functional basic Memory: Impaired Memory Impairment: Retrieval deficit, Decreased short term memory Decreased Short Term Memory: Verbal basic, Functional basic Awareness: Appears intact Behaviors: Restless, Perseveration Comments: DTA cognition    Extremity Assessment (includes Sensation/Coordination)  Upper Extremity Assessment: LUE deficits/detail LUE Deficits / Details: shoulder pain, decreaed pain free AROM at shoulder and elbow. Tender to touch on lateral aspect of shoulder and rhomboids. Reluctant to bear weight through U. LUE Coordination: decreased gross motor  Lower Extremity Assessment: Defer to PT evaluation    ADLs  Overall ADL's : Needs assistance/impaired Eating/Feeding: Supervision/ safety Eating/Feeding Details (indicate cue type and reason): supervision A required for adherence to diet orders Grooming: Min guard, Oral care, Standing Grooming Details (indicate cue type and reason): pt observed to support weight on bent UE hunched over sink. Significantly decr activity tolerance after functional mobility Upper Body Bathing: Set up, Sitting Upper Body Dressing : Set up, Sitting Toilet Transfer: Minimal assistance Toilet  Transfer Details (indicate cue type and reason): Min A for rise from EOB Functional mobility during ADLs: Minimal assistance, Rolling walker (2 wheels) General ADL Comments: Decr activity tolerance.    Mobility  Overal bed mobility: Needs Assistance Bed Mobility: Supine to Sit, Sit to Supine Sidelying to sit: Min guard, HOB elevated Supine to sit: Min assist General bed mobility comments: extra time needed with + use of bedrails, min a to return BLEs to bed    Transfers  Overall transfer level: Needs assistance Equipment used: Rolling walker (2 wheels) Transfers: Sit to/from Stand Sit to Stand: Min assist General transfer comment: min assist to power up    Ambulation / Gait / Stairs / Wheelchair Mobility  Ambulation/Gait Ambulation/Gait assistance: Mod assist Gait Distance (Feet): 80 Feet Assistive device: Rolling walker (2 wheels) Gait Pattern/deviations: Step-through pattern, Step-to pattern, Decreased stride length, Ataxic General Gait Details:   ataxic gait with pt advancing RLE from hip with vaulting like movement with noted diffculty controling descend to floor, hyperextension on L with pt unable to correct without L knee buckling, mod assist throughout to steady RW and maintain balance, x1 standing recoervy break 2/2 fatigue Gait velocity: decr Pre-gait activities: standing marching x20 Stairs:  (Pt declined further OOB activities due to fatigue)    Posture / Balance Dynamic Sitting Balance Sitting balance - Comments: Used bil UE to support trunk while sitting. Balance Overall balance assessment: Needs assistance Sitting-balance support: Bilateral upper extremity supported Sitting balance-Leahy Scale: Good Sitting balance - Comments: Used bil UE to support trunk while sitting. Standing balance support: Bilateral upper extremity supported Standing balance-Leahy Scale: Poor Standing balance comment: heavy reliance on BUE support, leaning on forearms at sink o brush teeth     Special needs/care consideration Diabetic management yes   Previous Home Environment (from acute therapy documentation) Living Arrangements: Spouse/significant other  Lives With: Family Available Help at Discharge: Family, Available PRN/intermittently Type of Home: Apartment Home Layout: One level Home Access: Stairs to enter Entrance Stairs-Rails: Left Entrance Stairs-Number of Steps: 15 Bathroom Shower/Tub: Tub/shower unit Bathroom Toilet: Handicapped height Bathroom Accessibility: No Home Care Services: No  Discharge Living Setting Plans for Discharge Living Setting: Patient's home, Lives with (comment) (s/o and nephew) Type of Home at Discharge: Apartment Discharge Home Layout: One level Discharge Home Access: Stairs to enter Entrance Stairs-Rails: Left Entrance Stairs-Number of Steps: 15 Discharge Bathroom Shower/Tub: Tub/shower unit Discharge Bathroom Toilet: Handicapped height Discharge Bathroom Accessibility: No Does the patient have any problems obtaining your medications?: No  Social/Family/Support Systems Anticipated Caregiver: s/o Marvin and nephew Youseph, daughter Malikah is primary contact Anticipated Caregiver's Contact Information: Malikah [phone]; Youseph [phone] Ability/Limitations of Caregiver: n/a Caregiver Availability: 24/7 Discharge Plan Discussed with Primary Caregiver: Yes Is Caregiver In Agreement with Plan?: Yes Does Caregiver/Family have Issues with Lodging/Transportation while Pt is in Rehab?: No  Goals Patient/Family Goal for Rehab: PT/OT supervision, SLP supervision to mod I Expected length of stay: 10-12 days Additional Information: hx of previous CVAs not admitted to CIR previously Pt/Family Agrees to Admission and willing to participate: Yes Program Orientation Provided & Reviewed with Pt/Caregiver Including Roles  & Responsibilities: Yes  Barriers to Discharge: Insurance for SNF coverage, Home environment  access/layout  Decrease burden of Care through IP rehab admission: n/a  Possible need for SNF placement upon discharge: no  Patient Condition: I have reviewed medical records from [facility], spoken with  TOC team , and daughter. I met with patient at the bedside and discussed via phone for inpatient rehabilitation assessment.  Patient will benefit from ongoing PT, OT, and SLP, can actively participate in 3 hours of therapy a day 5 days of the week, and can make measurable gains during the admission.  Patient will also benefit from the coordinated team approach during an Inpatient Acute Rehabilitation admission.  The patient will receive intensive therapy as well as Rehabilitation physician, nursing, social worker, and care management interventions.  Due to safety, skin/wound care, disease management, medication administration, pain management, and patient education the patient requires 24 hour a day rehabilitation nursing.  The patient is currently min assist with mobility and basic ADLs.  Discharge setting and therapy post discharge at home with home health is anticipated.  Patient has agreed to participate in the Acute Inpatient Rehabilitation Program and will admit today.  Preadmission Screen Completed By:  Tayte Mcwherter E Lura Falor, PT, DPT 03/07/2022 10:20 AM ______________________________________________________________________     Discussed status with Dr. Swartz on 03/07/22  at 10:24 AM  and received approval for admission today.  Admission Coordinator:  Omere Marti E Morgon Pamer, PT, DPT time 10:24 AM /Date 03/07/22    Assessment/Plan: Diagnosis: left medullary, cerebellar, right pontine infarcts Does the need for close, 24 hr/day Medical supervision in concert with the patient's rehab needs make it unreasonable for this patient to be served in a less intensive setting? Yes Co-Morbidities requiring supervision/potential complications: obesity, dm2, htn, severe left shoulder pain Due to bladder management,  bowel management, safety, skin/wound care, disease management, medication administration, pain management, and patient education, does the patient require 24 hr/day rehab nursing? Yes Does the patient require coordinated care of a physician, rehab nurse, PT, OT, and SLP to address physical and functional deficits in the context of the above medical diagnosis(es)? Yes Addressing deficits in the following areas: balance, endurance, locomotion, strength, transferring, bowel/bladder control, bathing, dressing, feeding, grooming, toileting, speech, swallowing, and psychosocial support Can the patient actively participate in an intensive therapy program of at least 3 hrs of therapy 5 days a week? Yes The potential for patient to make measurable gains while on inpatient rehab is excellent Anticipated functional outcomes upon discharge from inpatient rehab: supervision PT, supervision OT, modified independent and supervision SLP Estimated rehab length of stay to reach the above functional goals is: 10-12 days Anticipated discharge destination: Home 10. Overall Rehab/Functional Prognosis: excellent   MD Signature: Zachary T. Swartz, MD, FAAPMR [facility] Medical Director Rehabilitation Services 03/07/2022  

## 2022-03-07 NOTE — Care Management Important Message (Signed)
Important Message  Patient Details  Name: Michelle Brooks MRN: [id number] Date of Birth: [birth year]   Medicare Important Message Given:  Other (see comment)     Hannah Beat 03/07/2022, 3:16 PM

## 2022-03-07 NOTE — Discharge Summary (Signed)
Physician Discharge Summary  Michelle Brooks ZOX:[id number] DOB: 02/01/1966 DOA: 02/26/2022  PCP: Fleet ContrasAvbuere, Edwin, MD  Admit date: 02/26/2022 Discharge date: 03/07/2022 30 Day Unplanned Readmission Risk Score    Flowsheet Row ED to Hosp-Admission (Current) from [facility]  30 Day Unplanned Readmission Risk Score (%) 13.07 Filed at 03/07/2022 0801       This score is the patient's risk of an unplanned readmission within 30 days of being discharged (0 -100%). The score is based on dignosis, age, lab data, medications, orders, and past utilization.   Low:  0-14.9   Medium: 15-21.9   High: 22-29.9   Extreme: 30 and above          Admitted From: Home Disposition: CIR  Recommendations for Outpatient Follow-up:  Follow up with PCP in 1-2 weeks Please obtain BMP/CBC in one week Follow-up with neurology in 4 weeks Please take both aspirin and Plavix for 3 months followed by aspirin alone Please follow up with your PCP on the following pending results: Unresulted Labs (From admission, onward)     Start     Ordered   Signed and Held  CBC  (enoxaparin (LOVENOX)    CrCl >/= 30 ml/min)  Once,   R       Comments: Baseline for enoxaparin therapy IF NOT ALREADY DRAWN.  Notify MD if PLT < 100 K.   Question:  Specimen collection method  Answer:  Lab=Lab collect   Signed and Held   Signed and Held  Creatinine, serum  (enoxaparin (LOVENOX)    CrCl >/= 30 ml/min)  Once,   R       Comments: Baseline for enoxaparin therapy IF NOT ALREADY DRAWN.   Question:  Specimen collection method  Answer:  Lab=Lab collect   Signed and Held   Signed and Held  Creatinine, serum  (enoxaparin (LOVENOX)    CrCl >/= 30 ml/min)  Weekly,   R     Comments: while on enoxaparin therapy   Question:  Specimen collection method  Answer:  Lab=Lab collect   Signed and Held   Signed and Held  Comprehensive metabolic panel  Once,   R       Question:  Specimen collection method  Answer:  Lab=Lab  collect   Signed and Held   Signed and Held  CBC with Differential/Platelet  Once,   R       Question:  Specimen collection method  Answer:  Lab=Lab collect   Signed and Held              Home Health: None Equipment/Devices: None  Discharge Condition: Stable CODE STATUS: Full code Diet recommendation: Cardiac  Subjective: Patient seen and examined she has no complaints.  Brief/Interim Summary: 57 y.o. female with medical history significant of chronic back pain, DM, HTN, prior CVA, and HLD presenting with emesis.  She has a h/o prior CVAs and these have historically presented with n/v.  She ate a lot of shrimp (no known allergy) and awoke with n/v.  She also noticed some dysphagia, dysarthria, tongue swelling, and eventual left-sided weakness (facial droop as well as LUE/LLE).   She was hospitalized due to concern for acute stroke.   Acute stroke MRI confirmed stroke. Patient was seen by neurology. Patient to be on aspirin and Plavix for 3 months followed by Plavix alone.  LDL is 118.  Patient was on pravastatin prior to admission.  Changed over to atorvastatin. HbA1c 8.7. Echocardiogram shows normal  systolic function with grade 1 diastolic dysfunction. Initially skilled nursing facility was recommended.  Now CIR is recommended to which the patient is agreeable and she is to be discharged to CIR today.  Neurology cleared the patient for discharge.   Oropharyngeal dysphagia Continues to have dysphagia.  Speech therapy continues to follow.  Seems to be tolerating full liquid diet.  Speech therapy continues to follow.   SVT/sinus tachycardia Patient was noted to have SVT on 1/22.  She was given metoprolol and converted to sinus tach and then to sinus rhythm.   No concern for A-fib at this time.  TSH was normal.  Recently done echocardiogram shows normal systolic function with grade 1 diastolic dysfunction.  No significant valvular abnormalities noted. Current tachycardia on 1/24.   Now on oral metoprolol.  Heart rate has been better controlled.  Maintaining sinus rhythm.  Chronic kidney disease stage IIIa Creatinine is stable.  Likely has some element of chronic kidney disease.  Renal function is stable.  Monitor urine output.   Questionable allergic reaction Patient had evidence for angioedema at the time of admission.  Likely associated with shrimp intake recently. She was given Benadryl and Pepcid with improvement. Appears to have resolved.  Dysphagia appears to be primarily secondary to her stroke rather than angioedema.   Essential hypertension Blood pressure is stable for the most part.  Occasional high readings noted.  Initially allowed permissive hypertension.  Will resume losartan.  Further medication adjustment can be done at CIR based on her blood pressure readings.   Diabetes mellitus type 2, uncontrolled with hyperglycemia HbA1c 8.7.  Her home medicines are currently on hold.  Currently just on SSI.  CBGs are stable for the most part.   Obesity Estimated body mass index is 38.01 kg/m as calculated from the following:   Height as of this encounter: 5\' 8"  (1.727 m).   Weight as of this encounter: 113.4 kg. Weight loss and dietary modification counseled.  Discharge plan was discussed with patient and/or family member and they verbalized understanding and agreed with it.  Discharge Diagnoses:  Principal Problem:   Acute CVA (cerebrovascular accident) [facility](HCC) Active Problems:   Type 2 diabetes mellitus with hyperlipidemia (HCC)   Hypertension   Dyslipidemia   Obesity   Vomiting in adult   Allergic reaction to food    Discharge Instructions   Allergies as of 03/07/2022       Reactions   Lactose Intolerance (gi) Diarrhea, Nausea And Vomiting   Latex    Like a burning in area        Medication List     STOP taking these medications    pravastatin 80 MG tablet Commonly known as: PRAVACHOL       TAKE these medications    Accu-Chek  Aviva Plus w/Device Kit 1 kit by Does not apply route 3 (three) times daily before meals.   True Metrix Meter Devi 1 each by Does not apply route 4 (four) times daily -  before meals and at bedtime.   aspirin EC 81 MG tablet Take 1 tablet (81 mg total) by mouth daily.   atorvastatin 80 MG tablet Commonly known as: LIPITOR Take 1 tablet (80 mg total) by mouth daily.   clopidogrel 75 MG tablet Commonly known as: PLAVIX Take 1 tablet (75 mg total) by mouth daily.   Dulaglutide 3 MG/0.5ML Sopn Inject 3 mg into the skin once a week. Sunday   glucose blood test strip Commonly known as: True Metrix  Blood Glucose Test Use as instructed   insulin lispro protamine-lispro (50-50) 100 UNIT/ML Susp injection Commonly known as: HUMALOG 50/50 MIX Inject 80 Units into the skin 3 (three) times daily. Up to 60 depending on cbg   Januvia 100 MG tablet Generic drug: sitaGLIPtin Take 100 mg by mouth daily.   metoprolol tartrate 25 MG tablet Commonly known as: LOPRESSOR Take 1 tablet (25 mg total) by mouth 2 (two) times daily.   TRUEplus Lancets 28G Misc 1 each by Does not apply route 4 (four) times daily -  before meals and at bedtime.   valsartan 160 MG tablet Commonly known as: DIOVAN Take 160 mg by mouth daily.        Follow-up Information     Fleet ContrasAvbuere, Edwin, MD Follow up in 1 week(s).   Specialty: Internal Medicine Contact information: [address] [place] [address] [phone]         [facility] Follow up in 1 month(s).   Contact information: [address] [place] [address] [phone]                Allergies  Allergen Reactions   Lactose Intolerance (Gi) Diarrhea and Nausea And Vomiting   Latex     Like a burning in area    Consultations: Neurology   Procedures/Studies: DG Swallowing Func-Speech Pathology  Result Date: 03/06/2022 Table formatting from the original result was not included. Objective  Swallowing Evaluation: Type of Study: MBS-Modified Barium Swallow Study  Patient Details Name: Michelle Brooks MRN: [id number] Date of Birth: 04/30/1965 Today's Date: 03/06/2022 Time: SLP Start Time (ACUTE ONLY): 0915 -SLP Stop Time (ACUTE ONLY): 0930 SLP Time Calculation (min) (ACUTE ONLY): 15 min Past Medical History: Past Medical History: Diagnosis Date  Chronic back pain   Diabetes mellitus type 2 in obese (HCC)   Diabetic retinopathy (HCC)   GERD (gastroesophageal reflux disease)   Headache   History of stroke 03/2010  Right MCA stroke in 03/2010, with MRI also noting subacute strokes in left fronto parietal area [place] [facility] to uncontrolled blood pressure // No residual deficits // [facility] - normal LV systolic function, moderate pericardial effusion with 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  HSV-2 infection   Hyperlipidemia LDL goal < 100 04/01/2011  Hypertension   Lumbago   pain in the back Past Surgical History: Past Surgical History: Procedure Laterality Date  BREAST LUMPECTOMY  2002  states benign lymph node removed.  BUBBLE STUDY  08/30/2018  Procedure: BUBBLE STUDY;  Surgeon: Vesta MixerNahser, Philip J, MD;  [facility];  Service: Cardiovascular;;  ENDOMETRIAL BIOPSY  10/04/2020  LOOP RECORDER INSERTION N/A 08/30/2018  Procedure: LOOP RECORDER INSERTION;  Surgeon: Marinus Mawaylor, Gregg W, MD;  [facility];  Service: Cardiovascular;  Laterality: N/A;  TEE WITHOUT CARDIOVERSION N/A 08/30/2018  Procedure: TRANSESOPHAGEAL ECHOCARDIOGRAM (TEE);  Surgeon: Elease HashimotoNahser, Deloris PingPhilip J, MD;  [facility];  Service: Cardiovascular;  Laterality: N/A; HPI: Michelle BoettcherWanda D Feher is a 57 y.o. female with medical history significant of chronic back pain, DM, HTN, prior CVA, and HLD presenting with emesis.  She has a h/o prior CVAs and these have historically presented with n/v.  She ate a lot of shrimp (no  known allergy) and awoke yesterday AM with n/v.  She also noticed some dysphagia, dysarthria, tongue swelling, and eventual left-sided weakness (facial droop as well  as LUE/LLE).   Her symptoms have progressed some while in the ER.  The patient appears to be more concerned about possible allergic reaction as the cause for her difficulty swallowing. AFter initial assessment MRI showed multiple acute infarcts involving left lateral medulla, right pontomedullary junction and left cerebellum.  Subjective: "I can't swallow"  Recommendations for follow up therapy are one component of a multi-disciplinary discharge planning process, led by the attending physician.  Recommendations may be updated based on patient status, additional functional criteria and insurance authorization. Assessment / Plan / Recommendation   03/06/2022  10:16 AM Clinical Impressions Clinical Impression Pt's oropharyngeal esophageal swallow function has improved from prior MBS. Most concerning impairment is her decreased pharyngoesophageal function although it has signifcantly improved allowing increase in bolus volume to transit through the PES with each swallow. In addition she is able to produce multiple  dry swallows today which she previously had been unable to do. After initial swallows there residue ranges from moderate-max increasing with thicker viscosities. Strategies to reduce residue were effective including multiple swallows, swallows with thin barium and most significant was a head turn to the left which signifcantly reduced pyriform sinus residue. There was only one flash penetration with initial teaspoon sip of thin and protection and initiation of swallow was adequate. Her tongue base retraction, pharyngeal wall contraction in addition to decreased PES distention and duration contributed to vallecular and pyriform sinus residue. Recommend texture upgrade to Dys 3, continue thin with LEFT HEAD TURN with solids, multiple swallows and  alternate liquids and solids and crush meds. 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 Impact on safety and function Mild aspiration risk     03/06/2022  10:16 AM Treatment Recommendations Treatment Recommendations Therapy as outlined in treatment plan below     03/06/2022  10:16 AM Prognosis Prognosis for Safe Diet Advancement Good Barriers to Reach Goals Severity of deficits   03/06/2022  10:16 AM Diet Recommendations SLP Diet Recommendations Dysphagia 3 (Mech soft) solids;Thin liquid Liquid Administration via Straw;Cup Medication Administration Crushed with puree Compensations Slow rate;Small sips/bites;Follow solids with liquid;Multiple dry swallows after each bite/sip Postural Changes Seated upright at 90 degrees     03/06/2022  10:16 AM Other Recommendations Oral Care Recommendations Oral care BID Follow Up Recommendations Acute inpatient rehab (3hours/day) Functional Status Assessment Patient has had a recent decline in their functional status and demonstrates the ability to make significant improvements in function in a reasonable and predictable amount of time.   03/06/2022  10:16 AM Frequency and Duration  Speech Therapy Frequency (ACUTE ONLY) min 2x/week Treatment Duration 2 weeks     03/06/2022  10:16 AM Oral Phase Oral Phase [facility]    03/06/2022  10:16 AM Pharyngeal Phase Pharyngeal Phase Impaired Pharyngeal- Nectar Teaspoon Pharyngeal residue - valleculae;Pharyngeal residue - pyriform;Pharyngeal residue - cp segment;Reduced pharyngeal peristalsis;Reduced tongue base retraction Pharyngeal- Nectar Cup NT Pharyngeal- Thin Teaspoon Penetration/Aspiration during swallow;Pharyngeal residue - pyriform;Reduced pharyngeal peristalsis;Reduced tongue base retraction;Pharyngeal residue - cp segment;Pharyngeal residue - valleculae Pharyngeal Material enters airway, remains ABOVE vocal cords then ejected out Pharyngeal- Thin Cup  Pharyngeal residue - cp segment;Pharyngeal residue - valleculae;Pharyngeal residue - pyriform;Reduced pharyngeal peristalsis;Reduced tongue base retraction Pharyngeal- Thin Straw Reduced pharyngeal peristalsis;Reduced tongue base retraction;Pharyngeal residue - pyriform;Pharyngeal residue - valleculae;Pharyngeal residue - cp segment Pharyngeal- Puree Reduced pharyngeal peristalsis;Reduced airway/laryngeal closure;Pharyngeal residue - pyriform;Pharyngeal residue - valleculae;Pharyngeal residue - cp segment Pharyngeal- Regular Pharyngeal residue - cp segment;Pharyngeal residue - valleculae;Pharyngeal residue -  pyriform;Reduced pharyngeal peristalsis;Reduced tongue base retraction    03/06/2022  10:16 AM Cervical Esophageal Phase  Cervical Esophageal Phase -- Cervical Esophageal Comment -- Houston Siren 03/06/2022, 10:37 AM                     DG Swallowing Func-Speech Pathology  Result Date: 02/28/2022 Table formatting from the original result was not included. Objective Swallowing Evaluation: Type of Study: MBS-Modified Barium Swallow Study  Patient Details Name: Michelle Brooks MRN: [id number] Date of Birth: [birth year] Today's Date: 02/28/2022 Time: SLP Start Time (ACUTE ONLY): 66 -SLP Stop Time (ACUTE ONLY): 1430 SLP Time Calculation (min) (ACUTE ONLY): 20 min Past Medical History: Past Medical History: Diagnosis Date  Chronic back pain   Diabetes mellitus type 2 in obese ([place])   Diabetic retinopathy ([place])   GERD (gastroesophageal reflux disease)   Headache   History of stroke 03/2010  Right MCA stroke in 03/2010, with MRI also noting subacute strokes in left fronto parietal area - occured in [place] received care at [facility] thought due to uncontrolled blood pressure // No residual deficits // TTE (03/2010) at [facility] - normal LV systolic function, moderate pericardial effusion with 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  HSV-2 infection    Hyperlipidemia LDL goal < 100 04/01/2011  Hypertension   Lumbago   pain in the back Past Surgical History: Past Surgical History: Procedure Laterality Date  BREAST LUMPECTOMY  2002  states benign lymph node removed.  BUBBLE STUDY  08/30/2018  Procedure: BUBBLE STUDY;  Surgeon: Thayer Headings, MD;  Location: [place];  Service: Cardiovascular;;  ENDOMETRIAL BIOPSY  10/04/2020  LOOP RECORDER INSERTION N/A 08/30/2018  Procedure: LOOP RECORDER INSERTION;  Surgeon: Evans Lance, MD;  Location: [facility] CV LAB;  Service: Cardiovascular;  Laterality: N/A;  TEE WITHOUT CARDIOVERSION N/A 08/30/2018  Procedure: TRANSESOPHAGEAL ECHOCARDIOGRAM (TEE);  Surgeon: Acie Fredrickson Wonda Cheng, MD;  Location: [facility] ENDOSCOPY;  Service: Cardiovascular;  Laterality: N/A; HPI: Michelle Brooks is a 57 y.o. female with medical history significant of chronic back pain, DM, HTN, prior CVA, and HLD presenting with emesis.  She has a h/o prior CVAs and these have historically presented with n/v.  She ate a lot of shrimp (no known allergy) and awoke yesterday AM with n/v.  She also noticed some dysphagia, dysarthria, tongue swelling, and eventual left-sided weakness (facial droop as well as LUE/LLE).   Her symptoms have progressed some while in the ER.  The patient appears to be more concerned about possible allergic reaction as the cause for her difficulty swallowing. AFter initial assessment MRI showed multiple acute infarcts involving left lateral medulla, right pontomedullary junction and left cerebellum.  Subjective: "I can't swallow"  Recommendations for follow up therapy are one component of a multi-disciplinary discharge planning process, led by the attending physician.  Recommendations may be updated based on patient status, additional functional criteria and insurance authorization. Assessment / Plan / Recommendation   02/28/2022   2:00 PM Clinical Impressions Clinical Impression Pt presents with reduced anterior laryngeal mobility,  epiglottic deflection and UES relaxation/opening. Pt requires large consecutive boluses with high level of sensory feedback and increased pharyngeal pressure for UES opening sufficient for meaningful intake; pt can swallow about 2 oz at a time. Otherwise, with single sips only the majority of the bolus remains above UES and pools in pyriforms and is easily expectorated by pt. With larger thin boluses pt still has  reduced opening at tail of bolus with almost nasal regurgitation and  about 10% of bolus residing in pyriforms that is expectorated. Pt does not aspirate. She does have excessive mucous production during efforts. Pt cannot initaite a dry swallow to clear residue. Purees and thicker liquids are not tolerated given poor UES opening and severe residue. Attempted head turn left - same as neutral; head turn right and chin tuck worsened function. Recommend pt initiate full liquids BUT NO GRITS APPLESAUSE PUDDING OR JELLO yet. No meds crushed in puree, liquid only. Pt to begin pharyngeal exercises as soon as possible. Hopeful for rapid recovery. 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     02/28/2022   2:00 PM Treatment Recommendations Treatment Recommendations Therapy as outlined in treatment plan below     02/27/2022   2:00 PM Prognosis Prognosis for Safe Diet Advancement Fair Barriers to Reach Goals Other (Comment);Severity of deficits   02/28/2022   2:00 PM Diet Recommendations SLP Diet Recommendations Thin liquid Liquid Administration via Cup;Straw Medication Administration Via alternative means     02/28/2022   2:00 PM Other Recommendations Follow Up Recommendations Acute inpatient rehab (3hours/day)   02/28/2022   2:00 PM Frequency and Duration  Speech Therapy Frequency (ACUTE ONLY) min 2x/week Treatment Duration 2 weeks      02/28/2022   2:00 PM Oral Phase Oral Phase [facility]    02/28/2022   2:00 PM Pharyngeal Phase Pharyngeal Phase Impaired Pharyngeal- Nectar Teaspoon Reduced anterior laryngeal mobility;Pharyngeal residue - cp segment;Pharyngeal residue - pyriform;Reduced epiglottic inversion Pharyngeal- Nectar Cup Reduced anterior laryngeal mobility;Pharyngeal residue - cp segment;Pharyngeal residue - pyriform;Reduced epiglottic inversion Pharyngeal- Thin Teaspoon Reduced anterior laryngeal mobility;Pharyngeal residue - cp segment;Pharyngeal residue - pyriform;Reduced epiglottic inversion Pharyngeal- Thin Cup Reduced anterior laryngeal mobility;Pharyngeal residue - cp segment;Pharyngeal residue - pyriform;Reduced epiglottic inversion Pharyngeal- Thin Straw Reduced anterior laryngeal mobility;Pharyngeal residue - cp segment;Pharyngeal residue - pyriform;Reduced epiglottic inversion Pharyngeal- Puree Reduced anterior laryngeal mobility;Pharyngeal residue - cp segment;Reduced epiglottic inversion    02/28/2022   2:00 PM Cervical Esophageal Phase  Cervical Esophageal Phase Impaired Nectar Teaspoon Reduced cricopharyngeal relaxation Nectar Cup Reduced cricopharyngeal relaxation Nectar Straw Reduced cricopharyngeal relaxation Thin Teaspoon Reduced cricopharyngeal relaxation Thin Cup Reduced cricopharyngeal relaxation Thin Straw Reduced cricopharyngeal relaxation Puree Reduced cricopharyngeal relaxation DeBlois, Katherene Ponto 02/28/2022, 2:49 PM                     ECHOCARDIOGRAM COMPLETE BUBBLE STUDY  Result Date: 02/27/2022    ECHOCARDIOGRAM REPORT   Patient Name:   Michelle Brooks Date of Exam: 02/27/2022 Medical Rec #:  [id number]      Height:       68.0 in Accession #:    [id number]     Weight:       250.0 lb Date of Birth:  06/09/1965       BSA:          2.247 m Patient Age:    27 years       BP:           184/86 mmHg Patient Gender: F              HR:           80 bpm. Exam Location:  Inpatient Procedure: 2D Echo, Color Doppler, Cardiac  Doppler and Saline Contrast Bubble  Study Indications:    Stroke i63.9;  History:        Patient has prior history of Echocardiogram examinations, most                 recent 08/30/2018. Risk Factors:Hypertension, Diabetes and                 Dyslipidemia.  Sonographer:    Irving Burton Senior RDCS Referring Phys: [id number] [facility] IMPRESSIONS  1. Left ventricular ejection fraction, by estimation, is 60 to 65%. The left ventricle has normal function. The left ventricle has no regional wall motion abnormalities. There is mild concentric left ventricular hypertrophy. Left ventricular diastolic parameters are consistent with Grade I diastolic dysfunction (impaired relaxation).  2. Right ventricular systolic function is normal. The right ventricular size is normal. Tricuspid regurgitation signal is inadequate for assessing PA pressure.  3. No evidence of mitral valve regurgitation.  4. The aortic valve is tricuspid. Aortic valve regurgitation is not visualized.  5. The inferior vena cava is normal in size with greater than 50% respiratory variability, suggesting right atrial pressure of 3 mmHg.  6. Agitated saline contrast bubble study was negative, with no evidence of any interatrial shunt. Comparison(s): No significant change from prior study. Conclusion(s)/Recommendation(s): Prior TEE in 2020 showed possible very small PFO or AVM. No significant PFO seen on TTE. With recurrent stroke, recommend repeat TEE with more bicaval views (3D imaging) to determine if has PFO amenable for closure. Can also r/o veg related emboli. ILR did not show afib. FINDINGS  Left Ventricle: Left ventricular ejection fraction, by estimation, is 60 to 65%. The left ventricle has normal function. The left ventricle has no regional wall motion abnormalities. The left ventricular internal cavity size was normal in size. There is  mild concentric left ventricular hypertrophy. Left ventricular diastolic parameters are consistent with  Grade I diastolic dysfunction (impaired relaxation). Right Ventricle: The right ventricular size is normal. Right ventricular systolic function is normal. Tricuspid regurgitation signal is inadequate for assessing PA pressure. Left Atrium: Left atrial size was normal in size. Right Atrium: Right atrial size was normal in size. Pericardium: There is no evidence of pericardial effusion. Mitral Valve: No evidence of mitral valve regurgitation. Tricuspid Valve: Tricuspid valve regurgitation is not demonstrated. Aortic Valve: The aortic valve is tricuspid. Aortic valve regurgitation is not visualized. Pulmonic Valve: Pulmonic valve regurgitation is not visualized. Aorta: The aortic root and ascending aorta are structurally normal, with no evidence of dilitation. Venous: The inferior vena cava is normal in size with greater than 50% respiratory variability, suggesting right atrial pressure of 3 mmHg. IAS/Shunts: Agitated saline contrast was given intravenously to evaluate for intracardiac shunting. Agitated saline contrast bubble study was negative, with no evidence of any interatrial shunt.  LEFT VENTRICLE PLAX 2D LVIDd:         4.10 cm   Diastology LVIDs:         2.90 cm   LV e' medial:    6.64 cm/s LV PW:         1.70 cm   LV E/e' medial:  12.1 LV IVS:        1.10 cm   LV e' lateral:   8.38 cm/s LVOT diam:     2.20 cm   LV E/e' lateral: 9.6 LV SV:         68 LV SV Index:   30 LVOT Area:     3.80 cm  RIGHT VENTRICLE RV S prime:  14.80 cm/s TAPSE (M-mode): 2.2 cm LEFT ATRIUM             Index        RIGHT ATRIUM           Index LA diam:        3.30 cm 1.47 cm/m   RA Area:     13.90 cm LA Vol (A2C):   46.1 ml 20.52 ml/m  RA Volume:   30.90 ml  13.75 ml/m LA Vol (A4C):   52.2 ml 23.23 ml/m LA Biplane Vol: 49.7 ml 22.12 ml/m  AORTIC VALVE LVOT Vmax:   93.30 cm/s LVOT Vmean:  57.100 cm/s LVOT VTI:    0.178 m  AORTA Ao Root diam: 3.50 cm Ao Asc diam:  3.40 cm MITRAL VALVE MV Area (PHT): 2.81 cm     SHUNTS MV Decel  Time: 270 msec     Systemic VTI:  0.18 m MV E velocity: 80.10 cm/s   Systemic Diam: 2.20 cm MV A velocity: 109.00 cm/s MV E/A ratio:  0.73 Photographer signed by Carolan Clines Signature Date/Time: 02/27/2022/11:44:43 AM    Final    CT ANGIO HEAD NECK W WO CM  Result Date: 02/27/2022 CLINICAL DATA:  Neuro deficit, acute, stroke suspected EXAM: CT ANGIOGRAPHY HEAD AND NECK TECHNIQUE: Multidetector CT imaging of the head and neck was performed using the standard protocol during bolus administration of intravenous contrast. Multiplanar CT image reconstructions and MIPs were obtained to evaluate the vascular anatomy. Carotid stenosis measurements (when applicable) are obtained utilizing NASCET criteria, using the distal internal carotid diameter as the denominator. RADIATION DOSE REDUCTION: This exam was performed according to the departmental dose-optimization program which includes automated exposure control, adjustment of the mA and/or kV according to patient size and/or use of iterative reconstruction technique. CONTRAST:  62mL OMNIPAQUE IOHEXOL 350 MG/ML SOLN COMPARISON:  MRI head from the same day. CTA head/neck from 08/28/2018. FINDINGS: CTA NECK FINDINGS Aortic arch: Great vessel origins are patent without significant stenosis. Right carotid system: No evidence of dissection, stenosis (50% or greater), or occlusion. Mild atherosclerosis. Left carotid system: No evidence of dissection, stenosis (50% or greater), or occlusion. Mild atherosclerosis. Vertebral arteries: Evaluation of the proximal vertebral arteries limited by streak artifact. Non opacification of the left vertebral artery proximally and poor opacification of the right vertebral artery, suspicious for interval proximal occlusion and/or stenosis. Distal reconstitution bilaterally. Skeleton: Multilevel degenerative change. Other neck: Thyroid goiter with multiple nodules, as previously characterized. Upper chest: The visualized lung  apices demonstrate areas of emphysema and fibrosis/scarring. 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 Moderate right cavernous/paraclinoid ICA stenosis. Bilaterally there is occlusive or near occlusive stenosis of the M1 MCAs, likely mildly progressed from the prior. There are collaterals bilaterally. The left A1 ACA is diminutive. The right A1 ACA more distal ACAs are patent. Posterior circulation: Small vertebrobasilar system. The left intradural vertebral artery is stenotic but remains patent. The right intradural vertebral artery terminates as PICA, anatomic variant. Irregular opacification of the basilar artery, similar. The posterior communicating arteries largely supply the posterior cerebral areas which remain patent. Venous sinuses: As permitted by contrast timing, patent. Anatomic variants: Detailed above. Review of the MIP images confirms the above findings IMPRESSION: 1. In comparison to 2020 CTA, some progression of occlusive intracranial disease, suggestive of moyamoya type process and described above. 2. Evaluation of the proximal vertebral arteries  limited by streak artifact with apparent non-opacification of the left vertebral artery proximally and poor opacification right vertebral artery, suspicious for interval proximal occlusion and/or stenosis. Distal reconstitution bilaterally. Intracranially, posterior cerebral arteries are largely supplied by posterior communicating arteries. Electronically Signed   By: Feliberto Harts M.D.   On: 02/27/2022 08:34   MR BRAIN WO CONTRAST  Result Date: 02/27/2022 CLINICAL DATA:  Stroke follow-up, evaluate new hypodensity by CT. EXAM: MRI HEAD WITHOUT CONTRAST TECHNIQUE: Multiplanar, multiecho pulse sequences of the brain and surrounding structures were obtained without intravenous contrast. COMPARISON:  11/19/2020  FINDINGS: Brain: Small acute infarcts seen in the low right pontine medullary junction, left lateral medulla, and clustered in the left cerebellum. Pre-existing right cerebellar infarction. Small cortical infarcts have occurred along the cerebral convexities, greatest at the high right frontal and left parietal cortex. Brain volume is normal. No acute hemorrhage, hydrocephalus, or masslike finding Vascular: Grossly preserved flow voids when compared to prior. Skull and upper cervical spine: Normal marrow signal. Sinuses/Orbits: Negative. IMPRESSION: 1. Small acute infarcts in the left lateral medulla, right pontine medullary junction, and left cerebellum. 2. Remote infarcts in the right cerebellum and bilateral cerebral cortex. Electronically Signed   By: Tiburcio Pea M.D.   On: 02/27/2022 05:18   CT Head Wo Contrast  Result Date: 02/26/2022 CLINICAL DATA:  Transient ischemic attack. Nausea and vomiting. History of strokes. EXAM: CT HEAD WITHOUT CONTRAST TECHNIQUE: Contiguous axial images were obtained from the base of the skull through the vertex without intravenous contrast. RADIATION DOSE REDUCTION: This exam was performed according to the departmental dose-optimization program which includes automated exposure control, adjustment of the mA and/or kV according to patient size and/or use of iterative reconstruction technique. COMPARISON:  11/19/2020. FINDINGS: Brain: No acute intracranial hemorrhage, midline shift or mass effect. No extra-axial fluid collection. Encephalomalacia is present in the frontal lobe on the right, unchanged. There is a old infarct in the right cerebellar hemisphere. Scattered periventricular white matter hypodensities are present bilaterally. No hydrocephalus. A vague hypodense region is noted in the frontal lobe on the left which is new from the prior exam. Vascular: No hyperdense vessel or unexpected calcification. Skull: Normal. Negative for fracture or focal lesion.  Sinuses/Orbits: A round density is present in the right maxillary sinus, possible mucosal retention cyst or polyp. No acute orbital abnormality. Other: None. IMPRESSION: 1. Vague hypodensity in the frontal lobe on the left which is new from the previous exam. MRI is suggested for further evaluation. 2. Chronic microvascular ischemic changes with old infarcts in the right cerebellar hemisphere and right frontal lobe. Electronically Signed   By: Thornell Sartorius M.D.   On: 02/26/2022 21:31     Discharge Exam: Vitals:   03/07/22 0430 03/07/22 0727  BP: (!) 167/83 (!) 159/88  Pulse: 77 81  Resp: 18 18  Temp: 98 F (36.7 C) 98.5 F (36.9 C)  SpO2: 97% 100%   Vitals:   03/06/22 1539 03/06/22 2343 03/07/22 0430 03/07/22 0727  BP: (!) 148/92 (!) 162/84 (!) 167/83 (!) 159/88  Pulse: 84 82 77 81  Resp: 17 20 18 18   Temp: 98.9 F (37.2 C) 99.5 F (37.5 C) 98 F (36.7 C) 98.5 F (36.9 C)  TempSrc: Oral Oral Oral Oral  SpO2: 94% 97% 97% 100%  Weight:      Height:        General: Pt is alert, awake, not in acute distress Cardiovascular: RRR, S1/S2 +, no rubs, no gallops Respiratory: CTA bilaterally, no  wheezing, no rhonchi Abdominal: Soft, NT, ND, bowel sounds + Extremities: no edema, no cyanosis    The results of significant diagnostics from this hospitalization (including imaging, microbiology, ancillary and laboratory) are listed below for reference.     Microbiology: No results found for this or any previous visit (from the past 240 hour(s)).   Labs: BNP (last 3 results) No results for input(s): "BNP" in the last 8760 hours. Basic Metabolic Panel: Recent Labs  Lab 03/01/22 0642 03/02/22 0339 03/03/22 0323 03/04/22 0743 03/05/22 0640 03/07/22 0604  NA  --  137 137 137 135 135  K  --  3.5 3.7 3.7 4.2 4.2  CL  --  105 106 104 105 105  CO2  --  21* 21*  GLUCOSE  --  125* 124* 204* 188* 230*  BUN  --  CREATININE  --  1.26* 1.14* 1.27* 1.28* 1.42*   CALCIUM  --  8.9 8.9 8.9 8.9 8.8*  MG 1.9 2.1  --  2.0  --  2.0   Liver Function Tests: No results for input(s): "AST", "ALT", "ALKPHOS", "BILITOT", "PROT", "ALBUMIN" in the last 168 hours. No results for input(s): "LIPASE", "AMYLASE" in the last 168 hours. No results for input(s): "AMMONIA" in the last 168 hours. CBC: Recent Labs  Lab 03/01/22 0635 03/04/22 0743 03/07/22 0604  WBC 9.8 9.5 9.3  HGB 14.2 15.2* 14.2  HCT 41.3 45.0 42.1  MCV 84.1 85.1 84.9  PLT 252 278 270   Cardiac Enzymes: No results for input(s): "CKTOTAL", "CKMB", "CKMBINDEX", "TROPONINI" in the last 168 hours. BNP: Invalid input(s): "POCBNP" CBG: Recent Labs  Lab 03/06/22 1113 03/06/22 1638 03/06/22 2121 03/07/22 0349 03/07/22 0641  GLUCAP 257* 258* 249* 249* 230*   D-Dimer No results for input(s): "DDIMER" in the last 72 hours. Hgb A1c No results for input(s): "HGBA1C" in the last 72 hours. Lipid Profile No results for input(s): "CHOL", "HDL", "LDLCALC", "TRIG", "CHOLHDL", "LDLDIRECT" in the last 72 hours. Thyroid function studies No results for input(s): "TSH", "T4TOTAL", "T3FREE", "THYROIDAB" in the last 72 hours.  Invalid input(s): "FREET3" Anemia work up No results for input(s): "VITAMINB12", "FOLATE", "FERRITIN", "TIBC", "IRON", "RETICCTPCT" in the last 72 hours. Urinalysis    Component Value Date/Time   COLORURINE YELLOW 02/27/2022 0552   APPEARANCEUR HAZY (A) 02/27/2022 0552   APPEARANCEUR Turbid (A) 10/04/2020 0938   LABSPEC 1.025 02/27/2022 0552   PHURINE 5.0 02/27/2022 0552   GLUCOSEU 50 (A) 02/27/2022 0552   HGBUR NEGATIVE 02/27/2022 0552   BILIRUBINUR NEGATIVE 02/27/2022 0552   BILIRUBINUR Negative 10/04/2020 0938   KETONESUR 5 (A) 02/27/2022 0552   PROTEINUR 100 (A) 02/27/2022 0552   UROBILINOGEN 0.2 08/18/2014 1235   NITRITE NEGATIVE 02/27/2022 0552   LEUKOCYTESUR MODERATE (A) 02/27/2022 0552   Sepsis Labs Recent Labs  Lab 03/01/22 0635 03/04/22 0743 03/07/22 0604   WBC 9.8 9.5 9.3   Microbiology No results found for this or any previous visit (from the past 240 hour(s)).   Time coordinating discharge: Over 30 minutes  SIGNED:   Hughie Closs, MD  [facility] 03/07/2022, 10:05 AM *Please note that this is a verbal dictation therefore any spelling or grammatical errors are due to the "[employer] One" system interpretation. If 7PM-7AM, please contact night-coverage [email or web address]

## 2022-03-07 NOTE — Progress Notes (Signed)
1/26 Patient has discharged, IMM Letter will be mailed to the address on file.

## 2022-03-07 NOTE — TOC Transition Note (Signed)
Transition of Care [facility]) - CM/SW Discharge Note   Patient Details  Name: Michelle Brooks MRN: [id number] Date of Birth: Apr 26, 1965  [facility] CM/SW Contact:  Pollie Friar, RN Phone Number: 03/07/2022, 11:51 AM   Clinical Narrative:    Pt is discharging to CIR today. CM signing off.    Final next level of care: IP Rehab Facility Barriers to Discharge: No Barriers Identified   Patient Goals and CMS Choice CMS Medicare.gov Compare Post Acute Care list provided to:: Patient Choice offered to / list presented to : Patient  Discharge Placement                         Discharge Plan and Services Additional resources added to the After Visit Summary for     Discharge Planning Services: CM Consult                                 Social Determinants of Health (SDOH) Interventions SDOH Screenings   Food Insecurity: No Food Insecurity (03/03/2022)  Housing: Low Risk  (03/03/2022)  Transportation Needs: No Transportation Needs (03/03/2022)  Utilities: Not At Risk (03/03/2022)  Depression (PHQ2-9): Low Risk  (10/11/2018)  Tobacco Use: Medium Risk (02/26/2022)     Readmission Risk Interventions     No data to display

## 2022-03-07 NOTE — Progress Notes (Signed)
Inpatient Rehab Admissions Coordinator:    I have insurance approval and a bed available for pt to admit to CIR today. Dr. Doristine Bosworth in agreement.  Will let pt/family and TOC team know.   Shann Medal, PT, DPT Admissions Coordinator [phone] 03/07/22  10:05 AM

## 2022-03-07 NOTE — Progress Notes (Signed)
Speech Language Pathology Treatment: Dysphagia  Patient Details Name: Michelle Brooks MRN: [id number] DOB: [birth year] Today's Date: 03/07/2022 Time: 1601-0932 SLP Time Calculation (min) (ACUTE ONLY): 8 min  Assessment / Plan / Recommendation Clinical Impression  Pt initially declined po's saying "I just ate" but with encouragement was agreeable for graham cracker and thin liquid. She independently recalled and performed left head turn and did need reminders for multiple swallows and alternate solids and liquids. She did not affirm globus sensation this session however trials were limited. She did not have throat clears or coughs. She states she is being transferred today to inpatient rehab. Encouraged her to continue with her CTAR (chin tuck against resistance) exercise as well as compensatory strategies (left head turn with solids, multiple swallows and alternate liquids and solids). She refuses to have her meds crushed but agreeable with puree.    HPI HPI: Michelle Brooks is a 57 y.o. female with medical history significant of chronic back pain, DM, HTN, prior CVA, and HLD presenting with emesis.  She has a h/o prior CVAs and these have historically presented with n/v.  She ate a lot of shrimp (no known allergy) and awoke yesterday AM with n/v.  She also noticed some dysphagia, dysarthria, tongue swelling, and eventual left-sided weakness (facial droop as well as LUE/LLE).   Her symptoms have progressed some while in the ER.  The patient appears to be more concerned about possible allergic reaction as the cause for her difficulty swallowing. AFter initial assessment MRI showed multiple acute infarcts involving left lateral medulla, right pontomedullary junction and left cerebellum.      SLP Plan  Continue with current plan of care      Recommendations for follow up therapy are one component of a multi-disciplinary discharge planning process, led by the attending physician.  Recommendations may be  updated based on patient status, additional functional criteria and insurance authorization.    Recommendations  Diet recommendations: Dysphagia 3 (mechanical soft);Thin liquid Liquids provided via: Cup;Straw Medication Administration: Other (Comment) (pt refuses meds crushed) Supervision: Patient able to self feed Compensations: Slow rate;Small sips/bites;Multiple dry swallows after each bite/sip;Follow solids with liquid;Other (Comment) (HEAD TURN TO THE LEFT WITH SOLIDS) Postural Changes and/or Swallow Maneuvers: Seated upright 90 degrees                Oral Care Recommendations: Oral care BID Follow Up Recommendations: Acute inpatient rehab (3hours/day) Assistance recommended at discharge: Intermittent Supervision/Assistance SLP Visit Diagnosis: Dysphagia, pharyngoesophageal phase (R13.14) Attention and concentration deficit following: Cerebral infarction Plan: Continue with current plan of care           Houston Siren  03/07/2022, 9:52 AM

## 2022-03-07 NOTE — PMR Pre-admission (Signed)
PMR Admission Coordinator Pre-Admission Assessment  Patient: Michelle Brooks is an 57 y.o., female MRN: [id number] DOB: Sep 04, 1965 Height: 5\' 8"  (172.7 cm) Weight: 113.4 kg  Insurance Information HMO: yes    PPO:      PCP:      IPA:      80/20:      OTHER:  PRIMARY: [employer] Medicare      Policy#: [id number]      Subscriber: pt CM Name: Michelle Brooks      Phone#: [phone]     Fax#: [phone] Pre-Cert#: [id number] [place] for CIR from [facility] with Home and [facility] with updates due to fax listed above on 03/13/2022      Employer:  Benefits:  Phone #: [phone]     Name:  Eff. Date: 02/10/22     Deduct: $240 ($107.50 met)      Out of Pocket Max: [phone] ($107.50 met)      Life Max: n/a CIR: $1730/admission      SNF: 20 full days Outpatient: 80%     Co-Pay: 20% Home Health: 100%      Co-Pay:  DME: 80%     Co-Pay: 20% Providers:  SECONDARY: Medicaid      Policy#: [id number] p     Phone#:   Financial Counselor:       Phone#:   The "Data Collection Information Summary" for patients in Inpatient Rehabilitation Facilities with attached "[place] Records" was provided and verbally reviewed with: Patient  Emergency Contact Information Contact Information     Name Relation Home Work Mobile   Michelle Brooks Daughter   [phone]   Michelle Brooks Daughter   [phone]   Michelle Brooks   [phone]   Michelle Brooks   [phone]   nephew,Michelle Brooks    [phone]       Current Medical History  Patient Admitting Diagnosis: CVA   History of Present Illness: Michelle Brooks is a 57 year old right-handed female with history of chronic back pain, diabetes mellitus, CKD stage III, obesity with BMI 40.12, hypertension, prior right MCA CVA 2012 with loop recorder insertion maintained on low-dose aspirin, hyperlipidemia, quit smoking 17 years ago. Presented 02/26/2022 with nausea/vomiting, left eyelid droop difficulty swallowing and right side weakness.  CT/MRI showed  small acute infarcts in the left lateral medulla, right pontine medullary junction and left cerebellum.  Remote infarcts in the right cerebellum and bilateral cerebral cortex.  CT angiogram head and neck in comparison to 2020 CTA, some progression of occlusive intracranial disease, suggestive of moyamoya type process.  Admission chemistries unremarkable except glucose 164 creatinine 1.09, urine drug screen negative, hemoglobin A1c 8.7.  Patient did not receive tPA.  Echocardiogram with ejection fraction of 60 to 65% no wall motion abnormalities grade 1 diastolic dysfunction.  Neurology follow-up placed on low-dose aspirin and Plavix daily x 3 months then Plavix alone.  Her diet has been advanced to mechanical soft thin liquid.  Lovenox added for DVT prophylaxis.  Patient did have episode of SVT/sinus tachycardia 1/22 given metoprolol converted to sinus tach and then to sinus rhythm.  No concern for A-fib at that time.  She was placed on metoprolol.  Therapy evaluations completed and pt was recommended for a comprehensive rehab program.  Complete NIHSS TOTAL: 3  Patient's medical record from [facility] has been reviewed by the rehabilitation admission coordinator and physician.  Past Medical History  Past Medical History:  Diagnosis Date   Chronic back pain    Diabetes mellitus type 2 in obese ([place])  Diabetic retinopathy (HCC)    GERD (gastroesophageal reflux disease)    Headache    History of stroke 03/2010   Right MCA stroke in 03/2010, with MRI also noting subacute strokes in left fronto parietal area - occured in [place] received care at [facility] thought due to uncontrolled blood pressure // No residual deficits // TTE (03/2010) at [facility] - normal LV systolic function, moderate pericardial effusion with 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   HSV-2 infection    Hyperlipidemia LDL goal < 100 04/01/2011   Hypertension    Lumbago    pain  in the back    Has the patient had major surgery during 100 days prior to admission? No  Family History   family history includes Cancer (age of onset: 96) in her brother; Diabetes in her brother, brother, and mother; Hypertension in her brother; Stroke (age of onset: 4) in her mother.  Current Medications  Current Facility-Administered Medications:    [DISCONTINUED] acetaminophen (TYLENOL) tablet 650 mg, 650 mg, Oral, Q4H PRN **OR** acetaminophen (TYLENOL) 160 MG/5ML solution 650 mg, 650 mg, Per Tube, Q4H PRN, 650 mg at 03/05/22 1335 **OR** acetaminophen (TYLENOL) suppository 650 mg, 650 mg, Rectal, Q4H PRN, Jonah Blue, MD   aspirin chewable tablet 81 mg, 81 mg, Oral, Daily, Osvaldo Shipper, MD, 81 mg at 03/06/22 1156   atorvastatin (LIPITOR) tablet 80 mg, 80 mg, Oral, Daily, Osvaldo Shipper, MD, 80 mg at 03/06/22 1156   clopidogrel (PLAVIX) tablet 75 mg, 75 mg, Oral, Daily, Osvaldo Shipper, MD, 75 mg at 03/06/22 1156   diphenhydrAMINE (BENADRYL) 12.5 MG/5ML elixir 25 mg, 25 mg, Oral, Q8H PRN, Osvaldo Shipper, MD, 25 mg at 03/06/22 2344   enoxaparin (LOVENOX) injection 57.5 mg, 0.5 mg/kg, Subcutaneous, Q24H, Reome, Earle J, RPH, 57.5 mg at 03/06/22 1107   famotidine (PEPCID) tablet 20 mg, 20 mg, Oral, Daily, Osvaldo Shipper, MD, 20 mg at 03/06/22 1156   insulin aspart (novoLOG) injection 0-15 Units, 0-15 Units, Subcutaneous, TID WC, Jonah Blue, MD, 5 Units at 03/07/22 0644   insulin aspart (novoLOG) injection 0-5 Units, 0-5 Units, Subcutaneous, QHS, Jonah Blue, MD, 2 Units at 03/06/22 2210   insulin glargine-yfgn (SEMGLEE) injection 10 Units, 10 Units, Subcutaneous, Daily, Pahwani, Ravi, MD   lidocaine (LIDODERM) 5 % 1 patch, 1 patch, Transdermal, Q24H, Osvaldo Shipper, MD, 1 patch at 03/06/22 1738   metoprolol tartrate (LOPRESSOR) injection 5 mg, 5 mg, Intravenous, Q6H PRN, Hillary Bow, DO, 5 mg at 03/05/22 0857   metoprolol tartrate (LOPRESSOR) tablet 25 mg, 25 mg,  Oral, BID, Osvaldo Shipper, MD, 25 mg at 03/06/22 2210   ondansetron (ZOFRAN) injection 4 mg, 4 mg, Intravenous, Q6H PRN, Jonah Blue, MD  Patients Current Diet:  Diet Order             DIET DYS 3 Room service appropriate? Yes with Assist; Fluid consistency: Thin  Diet effective now                   Precautions / Restrictions Precautions Precautions: Fall Restrictions Weight Bearing Restrictions: No   Has the patient had 2 or more falls or a fall with injury in the past year? Yes  Prior Activity Level Limited Community (1-2x/wk): independent, driving, out for errands/appointments, no DME at baseline  Prior Functional Level Self Care: Did the patient need help bathing, dressing, using the toilet or eating? Independent  Indoor Mobility: Did the patient need assistance with  walking from room to room (with or without device)? Independent  Stairs: Did the patient need assistance with internal or external stairs (with or without device)? Independent  Functional Cognition: Did the patient need help planning regular tasks such as shopping or remembering to take medications? Independent  Patient Information Are you of Hispanic, Latino/a,or Spanish origin?: A. No, not of Hispanic, Latino/a, or Spanish origin What is your race?: B. Black or African American Do you need or want an interpreter to communicate with a doctor or health care staff?: 0. No  Patient's Response To:  Health Literacy and Transportation Is the patient able to respond to health literacy and transportation needs?: Yes Health Literacy - How often do you need to have someone help you when you read instructions, pamphlets, or other written material from your doctor or pharmacy?: Never In the past 12 months, has lack of transportation kept you from medical appointments or from getting medications?: No In the past 12 months, has lack of transportation kept you from meetings, work, or from getting things needed  for daily living?: No  Home Assistive Devices / Equipment Home Assistive Devices/Equipment: Environmental consultant (specify type), Cane (specify quad or straight) (2 wheel, straight cane.) Home Equipment: Agricultural consultant (2 wheels), Shower seat, Rollator (4 wheels), Cane - single point  Prior Device Use: Indicate devices/aids used by the patient prior to current illness, exacerbation or injury? None of the above  Current Functional Level Cognition  Arousal/Alertness: Awake/alert Overall Cognitive Status: Within Functional Limits for tasks assessed Orientation Level: Oriented X4 General Comments: Following commands throughout session, able to multitask at times. Attention: Sustained Sustained Attention: Impaired Sustained Attention Impairment: Verbal basic, Functional basic Memory: Impaired Memory Impairment: Retrieval deficit, Decreased short term memory Decreased Short Term Memory: Verbal basic, Functional basic Awareness: Appears intact Behaviors: Restless, Perseveration Comments: DTA cognition    Extremity Assessment (includes Sensation/Coordination)  Upper Extremity Assessment: LUE deficits/detail LUE Deficits / Details: shoulder pain, decreaed pain free AROM at shoulder and elbow. Tender to touch on lateral aspect of shoulder and rhomboids. Reluctant to bear weight through U. LUE Coordination: decreased gross motor  Lower Extremity Assessment: Defer to PT evaluation    ADLs  Overall ADL's : Needs assistance/impaired Eating/Feeding: Supervision/ safety Eating/Feeding Details (indicate cue type and reason): supervision A required for adherence to diet orders Grooming: Min guard, Oral care, Standing Grooming Details (indicate cue type and reason): pt observed to support weight on bent UE hunched over sink. Significantly decr activity tolerance after functional mobility Upper Body Bathing: Set up, Sitting Upper Body Dressing : Set up, Sitting Toilet Transfer: Minimal assistance Toilet  Transfer Details (indicate cue type and reason): Min A for rise from EOB Functional mobility during ADLs: Minimal assistance, Rolling walker (2 wheels) General ADL Comments: Decr activity tolerance.    Mobility  Overal bed mobility: Needs Assistance Bed Mobility: Supine to Sit, Sit to Supine Sidelying to sit: Min guard, HOB elevated Supine to sit: Min assist General bed mobility comments: extra time needed with + use of bedrails, min a to return BLEs to bed    Transfers  Overall transfer level: Needs assistance Equipment used: Rolling walker (2 wheels) Transfers: Sit to/from Stand Sit to Stand: Min assist General transfer comment: min assist to power up    Ambulation / Gait / Stairs / Wheelchair Mobility  Ambulation/Gait Ambulation/Gait assistance: Mod assist Gait Distance (Feet): 80 Feet Assistive device: Rolling walker (2 wheels) Gait Pattern/deviations: Step-through pattern, Step-to pattern, Decreased stride length, Ataxic General Gait Details:  ataxic gait with pt advancing RLE from hip with vaulting like movement with noted diffculty controling descend to floor, hyperextension on L with pt unable to correct without L knee buckling, mod assist throughout to steady RW and maintain balance, x1 standing recoervy break 2/2 fatigue Gait velocity: decr Pre-gait activities: standing marching x20 Stairs:  (Pt declined further OOB activities due to fatigue)    Posture / Balance Dynamic Sitting Balance Sitting balance - Comments: Used bil UE to support trunk while sitting. Balance Overall balance assessment: Needs assistance Sitting-balance support: Bilateral upper extremity supported Sitting balance-Leahy Scale: Good Sitting balance - Comments: Used bil UE to support trunk while sitting. Standing balance support: Bilateral upper extremity supported Standing balance-Leahy Scale: Poor Standing balance comment: heavy reliance on BUE support, leaning on forearms at sink o brush teeth     Special needs/care consideration Diabetic management yes   Previous Home Environment (from acute therapy documentation) Living Arrangements: Spouse/significant other  Lives With: Family Available Help at Discharge: Family, Available PRN/intermittently Type of Home: Apartment Home Layout: One level Home Access: Stairs to enter Entrance Stairs-Rails: Left Entrance Stairs-Number of Steps: 15 Bathroom Shower/Tub: Engineer, manufacturing systems: Handicapped height Bathroom Accessibility: No Home Care Services: No  Discharge Living Setting Plans for Discharge Living Setting: Patient's home, Lives with (comment) (s/o and nephew) Type of Home at Discharge: Apartment Discharge Home Layout: One level Discharge Home Access: Stairs to enter Entrance Stairs-Rails: Left Entrance Stairs-Number of Steps: 15 Discharge Bathroom Shower/Tub: Tub/shower unit Discharge Bathroom Toilet: Handicapped height Discharge Bathroom Accessibility: No Does the patient have any problems obtaining your medications?: No  Social/Family/Support Systems Anticipated Caregiver: s/o Mariana Kaufman and nephew Carnella Gu[place]ajara, daughter Caralee Ates is primary contact Anticipated Industrial/product designer Information: Caralee Ates [phone]; Carnella Gu[place]ara [phone] Ability/Limitations of Caregiver: n/a Caregiver Availability: 24/7 Discharge Plan Discussed with Primary Caregiver: Yes Is Caregiver In Agreement with Plan?: Yes Does Caregiver/Family have Issues with Lodging/Transportation while Pt is in Rehab?: No  Goals Patient/Family Goal for Rehab: PT/OT supervision, SLP supervision to mod I Expected length of stay: 10-12 days Additional Information: hx of previous CVAs not admitted to CIR previously Pt/Family Agrees to Admission and willing to participate: Yes Program Orientation Provided & Reviewed with Pt/Caregiver Including Roles  & Responsibilities: Yes  Barriers to Discharge: Insurance for SNF coverage, Home environment  access/layout  Decrease burden of Care through IP rehab admission: n/a  Possible need for SNF placement upon discharge: no  Patient Condition: I have reviewed medical records from [facility], spoken with  [facility] team , and daughter. I met with patient at the bedside and discussed via phone for inpatient rehabilitation assessment.  Patient will benefit from ongoing PT, OT, and SLP, can actively participate in 3 hours of therapy a day 5 days of the week, and can make measurable gains during the admission.  Patient will also benefit from the coordinated team approach during an Inpatient Acute Rehabilitation admission.  The patient will receive intensive therapy as well as Rehabilitation physician, nursing, social worker, and care management interventions.  Due to safety, skin/wound care, disease management, medication administration, pain management, and patient education the patient requires 24 hour a day rehabilitation nursing.  The patient is currently min assist with mobility and basic ADLs.  Discharge setting and therapy post discharge at home with home health is anticipated.  Patient has agreed to participate in the Acute Inpatient Rehabilitation Program and will admit today.  Preadmission Screen Completed By:  Stephania Fragmin, PT, DPT 03/07/2022 10:20 AM ______________________________________________________________________  Discussed status with Dr. Naaman Plummer on 03/07/22  at 10:24 AM  and received approval for admission today.  Admission Coordinator:  Michel Santee, PT, DPT time 10:24 AM Sudie Grumbling 03/07/22    Assessment/Plan: Diagnosis: left medullary, cerebellar, right pontine infarcts Does the need for close, 24 hr/day Medical supervision in concert with the patient's rehab needs make it unreasonable for this patient to be served in a less intensive setting? Yes Co-Morbidities requiring supervision/potential complications: obesity, dm2, htn, severe left shoulder pain Due to bladder management,  bowel management, safety, skin/wound care, disease management, medication administration, pain management, and patient education, does the patient require 24 hr/day rehab nursing? Yes Does the patient require coordinated care of a physician, rehab nurse, PT, OT, and SLP to address physical and functional deficits in the context of the above medical diagnosis(es)? Yes Addressing deficits in the following areas: balance, endurance, locomotion, strength, transferring, bowel/bladder control, bathing, dressing, feeding, grooming, toileting, speech, swallowing, and psychosocial support Can the patient actively participate in an intensive therapy program of at least 3 hrs of therapy 5 days a week? Yes The potential for patient to make measurable gains while on inpatient rehab is excellent Anticipated functional outcomes upon discharge from inpatient rehab: supervision PT, supervision OT, modified independent and supervision SLP Estimated rehab length of stay to reach the above functional goals is: 10-12 days Anticipated discharge destination: Home 10. Overall Rehab/Functional Prognosis: excellent   MD Signature: Meredith Staggers, MD, [place] Director Rehabilitation Services 03/07/2022

## 2022-03-07 NOTE — H&P (Addendum)
Physical Medicine and Rehabilitation Admission H&P        Chief Complaint  Patient presents with   Emesis  : HPI: Michelle Brooks is a 57 year old right-handed female with history of chronic back pain, diabetes mellitus, CKD stage III, obesity with BMI 40.12, hypertension, prior right MCA CVA 2012 with loop recorder insertion maintained on low-dose aspirin, hyperlipidemia, quit smoking 17 years ago.  Per chart review patient lives with boyfriend and nephew.  1 level home with multiple steps to entry.  Reportedly modified independent prior to admission.  Presented 02/26/2022 with nausea/vomiting, left eyelid droop difficulty swallowing and right side weakness.  CT/MRI showed small acute infarcts in the left lateral medulla, right pontine medullary junction and left cerebellum.  Remote infarcts in the right cerebellum and bilateral cerebral cortex.  CT angiogram head and neck in comparison to 2020 CTA, some progression of occlusive intracranial disease, suggestive of moyamoya type process.  Admission chemistries unremarkable except glucose 164 creatinine 1.09, urine drug screen negative, hemoglobin A1c 8.7.  Patient did not receive tPA.  Echocardiogram with ejection fraction of 60 to 65% no wall motion abnormalities grade 1 diastolic dysfunction.  Neurology follow-up placed on low-dose aspirin and Plavix daily x 3 months then Plavix alone.  Her diet has been advanced to mechanical soft thin liquid.  Lovenox added for DVT prophylaxis.  Patient did have episode of SVT/sinus tachycardia 1/22 given metoprolol converted to sinus tach and then to sinus rhythm.  No concern for A-fib at that time.  She was placed on metoprolol.  Therapy evaluations completed due to patient decreased functional mobility/side paresthesias was admitted for a comprehensive rehab program.   Review of Systems  Constitutional:  Negative for chills and fever.  HENT:  Negative for hearing loss.   Eyes:  Positive for blurred vision.  Negative for double vision.  Respiratory:  Negative for cough, shortness of breath and wheezing.   Cardiovascular:  Positive for leg swelling. Negative for chest pain and palpitations.  Gastrointestinal:  Positive for constipation, nausea and vomiting. Negative for heartburn.       GERD  Genitourinary:  Negative for dysuria, flank pain and hematuria.  Musculoskeletal:  Positive for back pain, joint pain and myalgias.  Skin:  Negative for rash.  Neurological:  Positive for sensory change, speech change, weakness and headaches.  All other systems reviewed and are negative.       Past Medical History:  Diagnosis Date   Chronic back pain     Diabetes mellitus type 2 in obese (HCC)     Diabetic retinopathy (HCC)     GERD (gastroesophageal reflux disease)     Headache     History of stroke 03/2010    Right MCA stroke in 03/2010, with MRI also noting subacute strokes in left fronto parietal area - [place] [facility] to uncontrolled blood pressure // No residual deficits // [facility] - normal LV systolic function, moderate pericardial effusion with 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   HSV-2 infection     Hyperlipidemia LDL goal < 100 04/01/2011   Hypertension     Lumbago      pain in the back         Past Surgical History:  Procedure Laterality Date   BREAST LUMPECTOMY   2002    states benign lymph node removed.   BUBBLE STUDY   08/30/2018  Procedure: BUBBLE STUDY;  Surgeon: Vesta Mixer, MD;  Location: [facility] ENDOSCOPY;  Service: Cardiovascular;;   ENDOMETRIAL BIOPSY   10/04/2020   LOOP RECORDER INSERTION N/A 08/30/2018    Procedure: LOOP RECORDER INSERTION;  Surgeon: Marinus Maw, MD;  Location: [facility] INVASIVE CV LAB;  Service: Cardiovascular;  Laterality: N/A;   TEE WITHOUT CARDIOVERSION N/A 08/30/2018    Procedure: TRANSESOPHAGEAL ECHOCARDIOGRAM (TEE);  Surgeon: Elease Hashimoto  Deloris Ping, MD;  Location: [facility] ENDOSCOPY;  Service: Cardiovascular;  Laterality: N/A;         Family History  Problem Relation Age of Onset   Diabetes Mother     Stroke Mother 37   Diabetes Brother     Diabetes Brother     Cancer Brother 43        unknown type   Hypertension Brother      Social History:  reports that she quit smoking about 17 years ago. Her smoking use included cigarettes. She has a 1.40 pack-year smoking history. She has never used smokeless tobacco. She reports that she does not drink alcohol and does not use drugs. Allergies:       Allergies  Allergen Reactions   Lactose Intolerance (Gi) Diarrhea and Nausea And Vomiting   Latex        Like a burning in area          Medications Prior to Admission  Medication Sig Dispense Refill   aspirin EC 81 MG tablet Take 81 mg by mouth daily.       Dulaglutide 3 MG/0.5ML SOPN Inject 3 mg into the skin once a week. Sunday       insulin lispro protamine-lispro (HUMALOG 50/50) (50-50) 100 UNIT/ML SUSP injection Inject 80 Units into the skin 3 (three) times daily. Up to 60 depending on cbg       JANUVIA 100 MG tablet Take 100 mg by mouth daily.       pravastatin (PRAVACHOL) 80 MG tablet Take 1 tablet (80 mg total) by mouth daily. 30 tablet 5   valsartan (DIOVAN) 160 MG tablet Take 160 mg by mouth daily.       Blood Glucose Monitoring Suppl (ACCU-CHEK AVIVA PLUS) W/DEVICE KIT 1 kit by Does not apply route 3 (three) times daily before meals. 1 kit 0   Blood Glucose Monitoring Suppl (TRUE METRIX METER) DEVI 1 each by Does not apply route 4 (four) times daily -  before meals and at bedtime. 1 Device 0   glucose blood (TRUE METRIX BLOOD GLUCOSE TEST) test strip Use as instructed 100 each 5   TRUEPLUS LANCETS 28G MISC 1 each by Does not apply route 4 (four) times daily -  before meals and at bedtime. 100 each 5          Home: Home Living Family/patient expects to be discharged to:: Private residence Living Arrangements:  Spouse/significant other Available Help at Discharge: Family, Available PRN/intermittently Type of Home: Apartment Home Access: Stairs to enter Secretary/administrator of Steps: 15 Entrance Stairs-Rails: Left Home Layout: One level Bathroom Shower/Tub: Engineer, manufacturing systems: Handicapped height Bathroom Accessibility: No Home Equipment: Agricultural consultant (2 wheels), Information systems manager, Rollator (4 wheels), [employer] - single point  Lives With: Family   Functional History: Prior Function Prior Level of Function : Independent/Modified Independent   Functional Status:  Mobility: Bed Mobility Overal bed mobility: Needs Assistance Bed Mobility: Supine to Sit, Sit to Supine Sidelying to sit: Min guard, HOB elevated Supine to sit: Min assist General bed mobility  comments: extra time needed with + use of bedrails, min a to return BLEs to bed Transfers Overall transfer level: Needs assistance Equipment used: Rolling walker (2 wheels) Transfers: Sit to/from Stand Sit to Stand: Min assist General transfer comment: min assist to power up Ambulation/Gait Ambulation/Gait assistance: Mod assist Gait Distance (Feet): 80 Feet Assistive device: Rolling walker (2 wheels) Gait Pattern/deviations: Step-through pattern, Step-to pattern, Decreased stride length, Ataxic General Gait Details: ataxic gait with pt advancing RLE from hip with vaulting like movement with noted diffculty controling descend to floor, hyperextension on L with pt unable to correct without L knee buckling, mod assist throughout to steady RW and maintain balance, x1 standing recoervy break 2/2 fatigue Gait velocity: decr Pre-gait activities: standing marching x20 Stairs:  (Pt declined further OOB activities due to fatigue)   ADL: ADL Overall ADL's : Needs assistance/impaired Eating/Feeding: Supervision/ safety Eating/Feeding Details (indicate cue type and reason): supervision A required for adherence to diet orders Grooming: Min  guard, Oral care, Standing Grooming Details (indicate cue type and reason): pt observed to support weight on bent UE hunched over sink. Significantly decr activity tolerance after functional mobility Upper Body Bathing: Set up, Sitting Upper Body Dressing : Set up, Sitting Toilet Transfer: Minimal assistance Toilet Transfer Details (indicate cue type and reason): Min A for rise from EOB Functional mobility during ADLs: Minimal assistance, Rolling walker (2 wheels) General ADL Comments: Decr activity tolerance.   Cognition: Cognition Overall Cognitive Status: Within Functional Limits for tasks assessed Arousal/Alertness: Awake/alert Orientation Level: Oriented X4 Year: 2024 Month: January Day of Week: Correct Attention: Sustained Sustained Attention: Impaired Sustained Attention Impairment: Verbal basic, Functional basic Memory: Impaired Memory Impairment: Retrieval deficit, Decreased short term memory Decreased Short Term Memory: Verbal basic, Functional basic Awareness: Appears intact Behaviors: Restless, Perseveration Comments: DTA cognition Cognition Arousal/Alertness: Awake/alert Behavior During Therapy: WFL for tasks assessed/performed Overall Cognitive Status: Within Functional Limits for tasks assessed General Comments: Following commands throughout session, able to multitask at times.   Physical Exam: Blood pressure (!) 167/83, pulse 77, temperature 98 F (36.7 C), temperature source Oral, resp. rate 18, height 5\' 8"  (1.727 m), weight 113.4 kg, last menstrual period 03/30/2016, SpO2 97 %. Physical Exam Constitutional:      Appearance: She is obese.  HENT:     Head: Normocephalic.     Right Ear: External ear normal.     Left Ear: External ear normal.  Eyes:     Extraocular Movements: Extraocular movements intact.     Conjunctiva/sclera: Conjunctivae normal.  Cardiovascular:     Rate and Rhythm: Normal rate and regular rhythm.     Heart sounds: No murmur heard.     No gallop.  Pulmonary:     Effort: Pulmonary effort is normal. No respiratory distress.     Breath sounds: No wheezing, rhonchi or rales.  Abdominal:     General: Bowel sounds are normal. There is no distension.     Palpations: Abdomen is soft.     Tenderness: There is no abdominal tenderness.  Musculoskeletal:     Cervical back: Normal range of motion.     Comments: Left arm extremely tender to PROM, shoulder seems to be main area of discomfort.   Skin:    General: Skin is warm and dry.  Neurological:     Comments: Pt is alert and oriented x 3. Left lid ptosis, seems to track equally to all fields. Decreased sensation left face. Speech slightly slurred. Normal language. RUE and RLE grossly 4  to 5/5. LUE limited by pain, ?2/5. LLE 4/5. Sensation: decreased discrimination of fine touch LUE but is almost allodynic to basic palpation. LLE sensation grossly intact to P/LT.[name] No resting tone appreciated     Patient is alert.  His eye contact with examiner.  Speech is mildly dysarthric but intelligible.  Provides name and age.  Follows simple commands.  Psychiatric:     Comments: Pt generally pleasant and cooperative        Lab Results Last 48 Hours        Results for orders placed or performed during the hospital encounter of 02/26/22 (from the past 48 hour(s))  Basic metabolic panel     Status: Abnormal    Collection Time: 03/05/22  6:40 AM  Result Value Ref Range    Sodium 135 135 - 145 mmol/L    Potassium 4.2 3.5 - 5.1 mmol/L    Chloride 105 98 - 111 mmol/L    CO2 21 (L) 22 - 32 mmol/L    Glucose, Bld 188 (H) 70 - 99 mg/dL      Comment: Glucose reference range applies only to samples taken after fasting for at least 8 hours.    BUN 9 6 - 20 mg/dL    Creatinine, Ser 1.28 (H) 0.44 - 1.00 mg/dL    Calcium 8.9 8.9 - 10.3 mg/dL    GFR, Estimated 49 (L) >60 mL/min      Comment: (NOTE) Calculated using the CKD-EPI Creatinine Equation (2021)      Anion gap 9 5 - 15      Comment:  Performed at [facility] [address], [place], [place] [address]  Glucose, capillary     Status: Abnormal    Collection Time: 03/05/22  7:59 AM  Result Value Ref Range    Glucose-Capillary 211 (H) 70 - 99 mg/dL      Comment: Glucose reference range applies only to samples taken after fasting for at least 8 hours.    Comment 1 Notify RN      Comment 2 Document in Chart    Glucose, capillary     Status: Abnormal    Collection Time: 03/05/22 12:18 PM  Result Value Ref Range    Glucose-Capillary 189 (H) 70 - 99 mg/dL      Comment: Glucose reference range applies only to samples taken after fasting for at least 8 hours.    Comment 1 Notify RN      Comment 2 Document in Chart    Glucose, capillary     Status: Abnormal    Collection Time: 03/05/22  3:53 PM  Result Value Ref Range    Glucose-Capillary 216 (H) 70 - 99 mg/dL      Comment: Glucose reference range applies only to samples taken after fasting for at least 8 hours.    Comment 1 Notify RN      Comment 2 Document in Chart    Glucose, capillary     Status: Abnormal    Collection Time: 03/05/22  8:56 PM  Result Value Ref Range    Glucose-Capillary 179 (H) 70 - 99 mg/dL      Comment: Glucose reference range applies only to samples taken after fasting for at least 8 hours.  Glucose, capillary     Status: Abnormal    Collection Time: 03/06/22  6:04 AM  Result Value Ref Range    Glucose-Capillary 225 (H) 70 - 99 mg/dL      Comment: Glucose reference range  applies only to samples taken after fasting for at least 8 hours.  Glucose, capillary     Status: Abnormal    Collection Time: 03/06/22 11:13 AM  Result Value Ref Range    Glucose-Capillary 257 (H) 70 - 99 mg/dL      Comment: Glucose reference range applies only to samples taken after fasting for at least 8 hours.    Comment 1 Notify RN      Comment 2 Document in Chart    Glucose, capillary     Status: Abnormal    Collection Time: 03/06/22  4:38 PM  Result Value Ref  Range    Glucose-Capillary 258 (H) 70 - 99 mg/dL      Comment: Glucose reference range applies only to samples taken after fasting for at least 8 hours.    Comment 1 Notify RN      Comment 2 Document in Chart    Glucose, capillary     Status: Abnormal    Collection Time: 03/06/22  9:21 PM  Result Value Ref Range    Glucose-Capillary 249 (H) 70 - 99 mg/dL      Comment: Glucose reference range applies only to samples taken after fasting for at least 8 hours.    Comment 1 Notify RN    Glucose, capillary     Status: Abnormal    Collection Time: 03/07/22  3:49 AM  Result Value Ref Range    Glucose-Capillary 249 (H) 70 - 99 mg/dL      Comment: Glucose reference range applies only to samples taken after fasting for at least 8 hours.       Imaging Results (Last 48 hours)  DG Swallowing Func-Speech Pathology   Result Date: 03/06/2022 Table formatting from the original result was not included. Objective Swallowing Evaluation: Type of Study: MBS-Modified Barium Swallow Study  Patient Details Name: Michelle Brooks MRN: [id number] Date of Birth: 13-Mar-1965 Today's Date: 03/06/2022 Time: SLP Start Time (ACUTE ONLY): 0915 -SLP Stop Time (ACUTE ONLY): 0930 SLP Time Calculation (min) (ACUTE ONLY): 15 min Past Medical History: Past Medical History: Diagnosis Date  Chronic back pain   Diabetes mellitus type 2 in obese (HCC)   Diabetic retinopathy (HCC)   GERD (gastroesophageal reflux disease)   Headache   History of stroke 03/2010  Right MCA stroke in 03/2010, with MRI also noting subacute strokes in left fronto parietal area - occured in [place] received care at [facility] thought due to uncontrolled blood pressure // No residual deficits // TTE (03/2010) at [facility] - normal LV systolic function, moderate pericardial effusion with 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  HSV-2 infection   Hyperlipidemia LDL goal < 100 04/01/2011  Hypertension   Lumbago   pain in  the back Past Surgical History: Past Surgical History: Procedure Laterality Date  BREAST LUMPECTOMY  2002  states benign lymph node removed.  BUBBLE STUDY  08/30/2018  Procedure: BUBBLE STUDY;  Surgeon: Vesta Mixer, MD;  Location: [facility] ENDOSCOPY;  Service: Cardiovascular;;  ENDOMETRIAL BIOPSY  10/04/2020  LOOP RECORDER INSERTION N/A 08/30/2018  Procedure: LOOP RECORDER INSERTION;  Surgeon: Marinus Maw, MD;  Location: [facility] INVASIVE CV LAB;  Service: Cardiovascular;  Laterality: N/A;  TEE WITHOUT CARDIOVERSION N/A 08/30/2018  Procedure: TRANSESOPHAGEAL ECHOCARDIOGRAM (TEE);  Surgeon: Elease Hashimoto Deloris Ping, MD;  Location: [facility] ENDOSCOPY;  Service: Cardiovascular;  Laterality: N/A; HPI: EVVA DIN is a 57 y.o. female with medical history significant of chronic back  pain, DM, HTN, prior CVA, and HLD presenting with emesis.  She has a h/o prior CVAs and these have historically presented with n/v.  She ate a lot of shrimp (no known allergy) and awoke yesterday AM with n/v.  She also noticed some dysphagia, dysarthria, tongue swelling, and eventual left-sided weakness (facial droop as well as LUE/LLE).   Her symptoms have progressed some while in the ER.  The patient appears to be more concerned about possible allergic reaction as the cause for her difficulty swallowing. AFter initial assessment MRI showed multiple acute infarcts involving left lateral medulla, right pontomedullary junction and left cerebellum.  Subjective: "I can't swallow"  Recommendations for follow up therapy are one component of a multi-disciplinary discharge planning process, led by the attending physician.  Recommendations may be updated based on patient status, additional functional criteria and insurance authorization. Assessment / Plan / Recommendation   03/06/2022  10:16 AM Clinical Impressions Clinical Impression Pt's oropharyngeal esophageal swallow function has improved from prior MBS. Most concerning impairment is her decreased  pharyngoesophageal function although it has signifcantly improved allowing increase in bolus volume to transit through the PES with each swallow. In addition she is able to produce multiple  dry swallows today which she previously had been unable to do. After initial swallows there residue ranges from moderate-max increasing with thicker viscosities. Strategies to reduce residue were effective including multiple swallows, swallows with thin barium and most significant was a head turn to the left which signifcantly reduced pyriform sinus residue. There was only one flash penetration with initial teaspoon sip of thin and protection and initiation of swallow was adequate. Her tongue base retraction, pharyngeal wall contraction in addition to decreased PES distention and duration contributed to vallecular and pyriform sinus residue. Recommend texture upgrade to Dys 3, continue thin with LEFT HEAD TURN with solids, multiple swallows and alternate liquids and solids and crush meds. 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 Impact on safety and function Mild aspiration risk     03/06/2022  10:16 AM Treatment Recommendations Treatment Recommendations Therapy as outlined in treatment plan below     03/06/2022  10:16 AM Prognosis Prognosis for Safe Diet Advancement Good Barriers to Reach Goals Severity of deficits   03/06/2022  10:16 AM Diet Recommendations SLP Diet Recommendations Dysphagia 3 (Mech soft) solids;Thin liquid Liquid Administration via Straw;Cup Medication Administration Crushed with puree Compensations Slow rate;Small sips/bites;Follow solids with liquid;Multiple dry swallows after each bite/sip Postural Changes Seated upright at 90 degrees     03/06/2022  10:16 AM Other Recommendations Oral Care Recommendations Oral care BID Follow Up Recommendations Acute inpatient rehab (3hours/day) Functional Status Assessment Patient has  had a recent decline in their functional status and demonstrates the ability to make significant improvements in function in a reasonable and predictable amount of time.   03/06/2022  10:16 AM Frequency and Duration  Speech Therapy Frequency (ACUTE ONLY) min 2x/week Treatment Duration 2 weeks     03/06/2022  10:16 AM Oral Phase Oral Phase [facility]    03/06/2022  10:16 AM Pharyngeal Phase Pharyngeal Phase Impaired Pharyngeal- Nectar Teaspoon Pharyngeal residue - valleculae;Pharyngeal residue - pyriform;Pharyngeal residue - cp segment;Reduced pharyngeal peristalsis;Reduced tongue base retraction Pharyngeal- Nectar Cup NT Pharyngeal- Thin Teaspoon Penetration/Aspiration during swallow;Pharyngeal residue - pyriform;Reduced pharyngeal peristalsis;Reduced tongue base retraction;Pharyngeal residue - cp segment;Pharyngeal residue - valleculae Pharyngeal Material enters airway, remains ABOVE vocal cords then ejected out Pharyngeal- Thin Cup Pharyngeal residue - cp segment;Pharyngeal residue -  valleculae;Pharyngeal residue - pyriform;Reduced pharyngeal peristalsis;Reduced tongue base retraction Pharyngeal- Thin Straw Reduced pharyngeal peristalsis;Reduced tongue base retraction;Pharyngeal residue - pyriform;Pharyngeal residue - valleculae;Pharyngeal residue - cp segment Pharyngeal- Puree Reduced pharyngeal peristalsis;Reduced airway/laryngeal closure;Pharyngeal residue - pyriform;Pharyngeal residue - valleculae;Pharyngeal residue - cp segment Pharyngeal- Regular Pharyngeal residue - cp segment;Pharyngeal residue - valleculae;Pharyngeal residue - pyriform;Reduced pharyngeal peristalsis;Reduced tongue base retraction    03/06/2022  10:16 AM Cervical Esophageal Phase  Cervical Esophageal Phase -- Cervical Esophageal Comment -- Royce MacadamiaLitaker, Lisa Willis 03/06/2022, 10:37 AM                             Blood pressure (!) 167/83, pulse 77, temperature 98 F (36.7 C), temperature source Oral, resp. rate 18, height 5\' 8"  (1.727 m), weight  113.4 kg, last menstrual period 03/30/2016, SpO2 97 %.   Medical Problem List and Plan: 1. 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  Antithrombotics: -DVT/anticoagulation:  Pharmaceutical: Lovenox             -antiplatelet therapy: Aspirin 81 mg daily and Plavix 75 mg daily x 3 months then Plavix alone 3. Severe left upper extremity/shoulder pain: likely hyperalgesia related to left lateral medullary infarct -check left shoulder xray -trial of gabapentin 100mg  tid -voltaren gel for shoulder -Lidoderm patch  -ROM activities with therapy 4. Mood/Behavior/Sleep: Provide emotional support             -antipsychotic agents: N/A 5. Neuropsych/cognition: This patient is capable of making decisions on her own behalf. 6. Skin/Wound Care: Routine skin checks 7. Fluids/Electrolytes/Nutrition: Routine in and outs with follow-up chemistries 8.  Dysphagia.  Diet advanced to mechanical soft thin liquids.   -tolerating presently -advance per speech therapy 9.  Hyperlipidemia.  Lipitor 10.  Diabetes mellitus.  Hemoglobin A1c 8.7.  Currently on SSI.  Prior to admission patient on DULAGLUTIDE 3 mg every Sunday and HUMALOG 50/50 80 units 3 times daily,JANUVIA 100 mg daily.  Resume as needed 11.  Hypertension/SVT.  Lopressor 25 mg twice daily.  Monitor with increased mobility 12.  Obesity.  BMI 40.12.  Dietary follow-up 13.  CKD stage IIIB.  Latest creatinine 1.28.  Follow-up chemistries       Charlton AmorDaniel J Angiulli, PA-C 03/07/2022  I have personally performed a face to face diagnostic evaluation of this patient and formulated the key components of the plan.  Additionally, I have personally reviewed laboratory data, imaging studies, as well as relevant notes  and concur with the physician assistant's documentation above.  The patient's status has not changed from the original H&P.  Any changes in documentation from the acute care chart have been noted above.  Ranelle OysterZachary T. Jolonda Gomm, MD, [place] DomFAAPMR

## 2022-03-07 NOTE — Progress Notes (Signed)
Inpatient Rehabilitation Admission Medication Review by a Pharmacist  A complete drug regimen review was completed for this patient to identify any potential clinically significant medication issues.  High Risk Drug Classes Is patient taking? Indication by Medication  Antipsychotic No   Anticoagulant Yes enoxaparin - VTE ppx  Antibiotic No   Opioid No   Antiplatelet Yes Aspirin, clopidogrel - stroke ppx  Hypoglycemics/insulin Yes insulin aspart - DM  Vasoactive Medication Yes Metoprolol- HTN  Chemotherapy No   Other Yes Atorvastatin - HLD famotidine - GERD ppx Gabapentin - neuropathy Diclofenac gel, lidocaine patch - topical pain relief     Type of Medication Issue Identified Description of Issue Recommendation(s)  Drug Interaction(s) (clinically significant)     Duplicate Therapy     Allergy     No Medication Administration End Date     Incorrect Dose     Additional Drug Therapy Needed     Significant med changes from prior encounter (inform family/care partners about these prior to discharge). Pravastatin changed to atorvastatin  humalog 50/50 > insulin aspart while IP  Valsartan, Dulaglutide, sitagliptin not resumed during CIR Communicate medication changes with patient/family at discharge  Other       Clinically significant medication issues were identified that warrant physician communication and completion of prescribed/recommended actions by midnight of the next day:  No   Pharmacist comments:  Per neurology's recommendations: continue DAPT with aspirin and clopidogrel for 3 months then clopidogrel monotherapy thereafter   Time spent performing this drug regimen review (minutes): 20   Thank you for allowing pharmacy to be a part of this patient's care.  Ardyth Harps, PharmD Clinical Pharmacist

## 2022-03-08 DIAGNOSIS — I6350 Cerebral infarction due to unspecified occlusion or stenosis of unspecified cerebral artery: Secondary | ICD-10-CM

## 2022-03-08 DIAGNOSIS — E1122 Type 2 diabetes mellitus with diabetic chronic kidney disease: Secondary | ICD-10-CM

## 2022-03-08 DIAGNOSIS — Z794 Long term (current) use of insulin: Secondary | ICD-10-CM

## 2022-03-08 DIAGNOSIS — M25512 Pain in left shoulder: Secondary | ICD-10-CM

## 2022-03-08 DIAGNOSIS — N182 Chronic kidney disease, stage 2 (mild): Secondary | ICD-10-CM

## 2022-03-08 LAB — GLUCOSE, CAPILLARY
Glucose-Capillary: 280 mg/dL — ABNORMAL HIGH (ref 70–99)
Glucose-Capillary: 314 mg/dL — ABNORMAL HIGH (ref 70–99)
Glucose-Capillary: 292 mg/dL — ABNORMAL HIGH (ref 70–99)
Glucose-Capillary: 247 mg/dL — ABNORMAL HIGH (ref 70–99)
Glucose-Capillary: 256 mg/dL — ABNORMAL HIGH (ref 70–99)

## 2022-03-08 MED ORDER — DOCUSATE SODIUM 100 MG PO CAPS
100.0000 mg | ORAL_CAPSULE | Freq: Two times a day (BID) | ORAL | Status: DC
  Administered 2022-03-08 – 2022-03-10 (×4): 100 mg via ORAL
  Filled 2022-03-08 (×11): qty 1

## 2022-03-08 MED ORDER — METOPROLOL TARTRATE 25 MG PO TABS
37.5000 mg | ORAL_TABLET | Freq: Two times a day (BID) | ORAL | Status: DC
  Administered 2022-03-08 – 2022-03-10 (×5): 37.5 mg via ORAL
  Filled 2022-03-08 (×5): qty 1

## 2022-03-08 MED ORDER — POLYETHYLENE GLYCOL 3350 17 G PO PACK
17.0000 g | PACK | Freq: Every day | ORAL | Status: DC
  Administered 2022-03-08 – 2022-03-09 (×2): 17 g via ORAL
  Filled 2022-03-08 (×7): qty 1

## 2022-03-08 MED ORDER — METOPROLOL TARTRATE 25 MG PO TABS
25.0000 mg | ORAL_TABLET | Freq: Once | ORAL | Status: DC
  Filled 2022-03-08: qty 1

## 2022-03-08 MED ORDER — INSULIN GLARGINE-YFGN 100 UNIT/ML ~~LOC~~ SOLN
30.0000 [IU] | Freq: Every day | SUBCUTANEOUS | Status: DC
  Administered 2022-03-08 – 2022-03-10 (×3): 30 [IU] via SUBCUTANEOUS
  Filled 2022-03-08 (×3): qty 0.3

## 2022-03-08 MED ORDER — POLYETHYLENE GLYCOL 3350 17 G PO PACK: 17.0000 g | PACK | Freq: Every day | ORAL | Status: DC | PRN

## 2022-03-08 NOTE — Progress Notes (Signed)
Asked patient if she needed to use the restroom. She declined.

## 2022-03-08 NOTE — Evaluation (Signed)
Occupational Therapy Assessment and Plan  Patient Details  Name: Michelle Brooks MRN: [id number] Date of Birth: 24-Nov-1965  OT Diagnosis: abnormal posture, acute pain, cognitive deficits, disturbance of vision, hemiplegia affecting dominant side, and pain in joint Rehab Potential: Rehab Potential (ACUTE ONLY): Good ELOS: 14-16 days   Today's Date: 03/08/2022 OT Individual Time: 3500-9381 OT Individual Time Calculation (min): 73 min     Hospital Problem: Principal Problem:   Right pontine cerebrovascular accident [facility])   Past Medical History:  Past Medical History:  Diagnosis Date   Chronic back pain    Diabetes mellitus type 2 in obese (HCC)    Diabetic retinopathy (HCC)    GERD (gastroesophageal reflux disease)    Headache    History of stroke 03/2010   Right MCA stroke in 03/2010, with MRI also noting subacute strokes in left fronto parietal area - occured in [place] received care at [facility] thought due to uncontrolled blood pressure // No residual deficits // TTE (03/2010) at [facility] - normal LV systolic function, moderate pericardial effusion with 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   HSV-2 infection    Hyperlipidemia LDL goal < 100 04/01/2011   Hypertension    Lumbago    pain in the back   Past Surgical History:  Past Surgical History:  Procedure Laterality Date   BREAST LUMPECTOMY  2002   states benign lymph node removed.   BUBBLE STUDY  08/30/2018   Procedure: BUBBLE STUDY;  Surgeon: Vesta Mixer, MD;  Location: [facility] ENDOSCOPY;  Service: Cardiovascular;;   ENDOMETRIAL BIOPSY  10/04/2020   LOOP RECORDER INSERTION N/A 08/30/2018   Procedure: LOOP RECORDER INSERTION;  Surgeon: Marinus Maw, MD;  Location: [facility] INVASIVE CV LAB;  Service: Cardiovascular;  Laterality: N/A;   TEE WITHOUT CARDIOVERSION N/A 08/30/2018   Procedure: TRANSESOPHAGEAL ECHOCARDIOGRAM (TEE);  Surgeon: Elease Hashimoto Deloris Ping, MD;  Location: [facility]  ENDOSCOPY;  Service: Cardiovascular;  Laterality: N/A;    Assessment & Plan Clinical Impression: Mammie Lorenzo. Ruhe is a 57 year old right-handed female with history of chronic back pain, diabetes mellitus, CKD stage III, obesity with BMI 40.12, hypertension, prior right MCA CVA 2012 with loop recorder insertion maintained on low-dose aspirin, hyperlipidemia, quit smoking 17 years ago.  Per chart review patient lives with boyfriend and nephew.  1 level home with multiple steps to entry.  Reportedly modified independent prior to admission.  Presented 02/26/2022 with nausea/vomiting, left eyelid droop difficulty swallowing and right side weakness.  CT/MRI showed small acute infarcts in the left lateral medulla, right pontine medullary junction and left cerebellum.  Remote infarcts in the right cerebellum and bilateral cerebral cortex.  CT angiogram head and neck in comparison to 2020 CTA, some progression of occlusive intracranial disease, suggestive of moyamoya type process.  Admission chemistries unremarkable except glucose 164 creatinine 1.09, urine drug screen negative, hemoglobin A1c 8.7.  Patient did not receive tPA.  Echocardiogram with ejection fraction of 60 to 65% no wall motion abnormalities grade 1 diastolic dysfunction.  Neurology follow-up placed on low-dose aspirin and Plavix daily x 3 months then Plavix alone.  Her diet has been advanced to mechanical soft thin liquid.  Lovenox added for DVT prophylaxis.  Patient did have episode of SVT/sinus tachycardia 1/22 given metoprolol converted to sinus tach and then to sinus rhythm.  No concern for A-fib at that time.  She was placed on metoprolol.  Patient transferred to CIR on 03/07/2022 .  Patient currently requires mod with basic self-care skills secondary to muscle weakness, abnormal tone and decreased coordination, and decreased sitting balance, decreased standing balance, decreased postural control, hemiplegia, and decreased balance strategies.   Prior to hospitalization, patient could complete BADL with independent .  Patient will benefit from skilled intervention to decrease level of assist with basic self-care skills and increase level of independence with iADL prior to discharge home with care partner.  Anticipate patient will require intermittent supervision and follow up outpatient.  OT - End of Session Activity Tolerance: Decreased this session Endurance Deficit: Yes OT Assessment Rehab Potential (ACUTE ONLY): Good OT Patient demonstrates impairments in the following area(s): Balance;Motor;Sensory;Behavior;Cognition;Vision;Endurance;Safety OT Basic ADL's Functional Problem(s): Eating;Grooming;Bathing;Dressing;Toileting OT Advanced ADL's Functional Problem(s): Simple Meal Preparation OT Transfers Functional Problem(s): Toilet;Tub/Shower OT Additional Impairment(s): Fuctional Use of Upper Extremity OT Plan OT Intensity: Minimum of 1-2 x/day, 45 to 90 minutes OT Frequency: 5 out of 7 days OT Duration/Estimated Length of Stay: 14-16 days OT Treatment/Interventions: Balance/vestibular training;Disease mangement/prevention;Neuromuscular re-education;Functional electrical stimulation;Patient/family education;Self Care/advanced ADL retraining;Splinting/orthotics;Therapeutic Exercise;UE/LE Coordination activities;Cognitive remediation/compensation;Discharge planning;DME/adaptive equipment instruction;Functional mobility training;Pain management;Psychosocial support;Therapeutic Activities;UE/LE Strength taining/ROM;Visual/perceptual remediation/compensation OT Self Feeding Anticipated Outcome(s): Mod I OT Basic Self-Care Anticipated Outcome(s): Set up/supervision OT Toileting Anticipated Outcome(s): Mod I OT Bathroom Transfers Anticipated Outcome(s): Mod I OT Recommendation Recommendations for Other Services: Neuropsych consult Patient destination: Home Follow Up Recommendations: Outpatient OT Equipment Recommended: To be  determined   OT Evaluation Precautions/Restrictions  Precautions Precautions: Fall Precaution Comments: L UE pain, ataxia Restrictions Weight Bearing Restrictions: No Vital Signs Therapy Vitals Temp: 97.9 F (36.6 C) Temp Source: Oral Pulse Rate: 61 Resp: 18 BP: 106/85 Patient Position (if appropriate): Sitting Oxygen Therapy SpO2: 97 % O2 Device: Room Air Pain Pain Assessment Pain Scale: 0-10 Pain Score: 5  Pain Type: Acute pain Pain Location: Shoulder Pain Orientation: Left Pain Descriptors / Indicators: Aching;Nagging Pain Onset: With Activity Patients Stated Pain Goal: 0 Pain Intervention(s): Repositioned;Shower Multiple Pain Sites: No Home Living/Prior Functioning Home Living Available Help at Discharge: Family, Available 24 hours/day Type of Home: Apartment Home Access: Stairs to enter Technical brewer of Steps: 15 Entrance Stairs-Rails: Left Home Layout: One level Bathroom Shower/Tub: Chiropodist: Handicapped height Bathroom Accessibility: No Additional Comments: pt reports she stopped using an AD ~1 year ago and that she was independent but would have a "couple of stumbles" but no true falls - states she didn't do any lifting before  Lives With: Family Prior Function Level of Independence: Independent with gait, Independent with transfers, Independent with homemaking with ambulation, Independent with basic ADLs  Able to Take Stairs?: Yes Driving: Yes Vocation: On disability Vision Baseline Vision/History: 1 Wears glasses Ability to See in Adequate Light: 1 Impaired Patient Visual Report: Other (comment) Vision Assessment?:  (retinal specialist) Eye Alignment: Impaired (comment) Ocular Range of Motion: Restricted on the right Alignment/Gaze Preference: Within Defined Limits Tracking/Visual Pursuits: Able to track stimulus in all quads without difficulty;Other (comment) Saccades: Decreased speed of saccadic movement;Other  (comment);Additional eye shifts occurred during testing Convergence: Impaired - to be further tested in functional context Diplopia Assessment: Disappears with one eye closed Additional Comments: Low vision - was seeing a retinal specialist prior to stroke, receiveing shots in eye - reports increased difficulty with facial numbness - needs furtehr assessment Perception  Perception: Within Functional Limits Praxis Praxis: Intact Cognition Cognition Overall Cognitive Status: Impaired/Different from baseline Arousal/Alertness: Awake/alert Orientation Level: Person;Place;Situation Person: Oriented Place: Oriented Situation: Oriented Memory: Impaired Memory Impairment: Retrieval deficit;Decreased short  term memory Decreased Short Term Memory: Functional complex Attention: Selective Focused Attention: Appears intact Sustained Attention: Appears intact Sustained Attention Impairment: Functional basic Selective Attention: Impaired Selective Attention Impairment: Functional basic;Functional complex Awareness: Appears intact Problem Solving: Impaired Problem Solving Impairment: Functional basic;Functional complex Executive Function: Organizing;Initiating;Decision Making;Sequencing Sequencing: Impaired Sequencing Impairment: Functional basic;Functional complex Organizing: Impaired Organizing Impairment: Functional basic;Functional complex Decision Making: Impaired Decision Making Impairment: Functional basic;Functional complex Initiating: Impaired Initiating Impairment: Functional basic;Functional complex Self Monitoring: Impaired Self Monitoring Impairment: Functional complex Self Correcting: Impaired Self Correcting Impairment: Functional complex Behaviors: Poor frustration tolerance Safety/Judgment: Appears intact Comments: pt does tend to perseverate on certain topics of conversation [employer] Scales of Cognitive Functioning: Purposeful, Appropriate Brief Interview for  Mental Status (BIMS) Repetition of Three Words (First Attempt): 3 Temporal Orientation: Year: Correct Temporal Orientation: Month: Accurate within 5 days Temporal Orientation: Day: Correct Recall: "Sock": Yes, no cue required Recall: "Blue": Yes, no cue required Recall: "Bed": Yes, no cue required BIMS Summary Score: 15 Sensation Sensation Light Touch: Appears Intact Hot/Cold: Not tested Proprioception: Impaired by gross assessment Stereognosis: Not tested Additional Comments: reports L UPPER side of her face feels numbness/tingling Coordination Gross Motor Movements are Fluid and Coordinated: No Fine Motor Movements are Fluid and Coordinated: No Finger Nose Finger Test: Unable due to limited range tolerated in RUE Motor  Motor Motor: Hemiplegia;Abnormal tone Motor - Skilled Clinical Observations: Weakness right side, shoulder tendonosis RUE  Trunk/Postural Assessment  Cervical Assessment Cervical Assessment: Within Functional Limits Thoracic Assessment Thoracic Assessment: Exceptions to [facility] (Limited trunk rotation / extension - possibly guarding with shoulder pain) Lumbar Assessment Lumbar Assessment: Exceptions to [facility] (posterior pelvis resting position) Postural Control Postural Control: Deficits on evaluation Trunk Control: slight posterior bias  Balance Balance Balance Assessed: Yes Static Sitting Balance Static Sitting - Balance Support: Feet unsupported;No upper extremity supported Static Sitting - Level of Assistance: 7: Independent Dynamic Sitting Balance Dynamic Sitting - Balance Support: Feet unsupported;No upper extremity supported Dynamic Sitting - Level of Assistance: 5: Stand by assistance Dynamic Sitting - Balance Activities: Lateral lean/weight shifting;Forward lean/weight shifting Static Standing Balance Static Standing - Balance Support: Bilateral upper extremity supported;During functional activity Static Standing - Level of Assistance: 4: Min  assist Dynamic Standing Balance Dynamic Standing - Balance Support: Bilateral upper extremity supported;During functional activity Dynamic Standing - Level of Assistance: 4: Min assist Dynamic Standing - Balance Activities: Lateral lean/weight shifting;Forward lean/weight shifting;Reaching for objects;Reaching across midline Extremity/Trunk Assessment RUE Assessment RUE Assessment: Exceptions to [facility] Active Range of Motion (AROM) Comments: Patient demonstrates less than 20 degrees of active shoulder flexion due to pain. General Strength Comments: NT Pain RUE Body System: Neuro;Ortho Brunstrum levels for arm and hand: Arm;Hand Brunstrum level for arm: Stage V Relative Independence from Synergy Brunstrum level for hand: Stage V Independence from basic synergies LUE Assessment LUE Assessment: Within Functional Limits Active Range of Motion (AROM) Comments: Grossly WFL General Strength Comments: NT  Care Tool Care Tool Self Care Eating   Eating Assist Level: Minimal Assistance - Patient > 75%    Oral Care    Oral Care Assist Level: Minimal Assistance - Patient > 75%    Bathing   Body parts bathed by patient: Chest;Abdomen;Front perineal area;Right upper leg;Left upper leg;Face;Buttocks Body parts bathed by helper: Buttocks;Left lower leg;Right lower leg   Assist Level: Moderate Assistance - Patient 50 - 74%    Upper Body Dressing(including orthotics)   What is the patient wearing?: Pull over shirt   Assist Level: Minimal  Assistance - Patient > 75%    Lower Body Dressing (excluding footwear)   What is the patient wearing?: Underwear/pull up;Pants Assist for lower body dressing: Moderate Assistance - Patient 50 - 74%    Putting on/Taking off footwear   What is the patient wearing?: Socks;Shoes;Ted hose Assist for footwear: Maximal Assistance - Patient 25 - 49%       Care Tool Toileting Toileting activity   Assist for toileting: Minimal Assistance - Patient > 75%     Care  Tool Bed Mobility Roll left and right activity   Roll left and right assist level: Minimal Assistance - Patient > 75%    Sit to lying activity   Sit to lying assist level: Minimal Assistance - Patient > 75%    Lying to sitting on side of bed activity   Lying to sitting on side of bed assist level: the ability to move from lying on the back to sitting on the side of the bed with no back support.: Minimal Assistance - Patient > 75%     Care Tool Transfers Sit to stand transfer   Sit to stand assist level: Minimal Assistance - Patient > 75%    Chair/bed transfer   Chair/bed transfer assist level: Minimal Assistance - Patient > 75%     Toilet transfer   Assist Level: Minimal Assistance - Patient > 75%     Care Tool Cognition  Expression of Ideas and Wants Expression of Ideas and Wants: 4. Without difficulty (complex and basic) - expresses complex messages without difficulty and with speech that is clear and easy to understand  Understanding Verbal and Non-Verbal Content Understanding Verbal and Non-Verbal Content: 4. Understands (complex and basic) - clear comprehension without cues or repetitions   Memory/Recall Ability Memory/Recall Ability : Current season;That he or she is in a hospital/hospital unit   Refer to Care Plan for Long Term Goals  SHORT TERM GOAL WEEK 1 OT Short Term Goal 1 (Week 1): Patient will demonstrate 30 degrees of active assisted right shoulder flexion in gravity eliminated position with no more than 2 point increase in pain OT Short Term Goal 2 (Week 1): Patient will complete toileting with contact guard assist OT Short Term Goal 3 (Week 1): Patienyt will complete tub shower transfer with min assist with use of tub trasnfer bench OT Short Term Goal 4 (Week 1): Patient will utilize Right arm motion to place into shirt sleeve with min cueing OT Short Term Goal 5 (Week 1): Patient will dress lower body (excluding TED hose) with intermittent  min  assist  Recommendations for other services: Neuropsych   Skilled Therapeutic Intervention Patient received supine in bed very eager for shower.  Patient assisted to bathroom and shower as noted below.  Patient with painful right shoulder exacerated by movement.  Patient guarding and protecting arm - perhaps leading to increased stiffness.  Patient appreciative of shower, and returned to bed - very fatigued at end of session.  Patient in bed with bed alarm engaged and call bell/ personal items in reach and boyfriend at bedside.   ADL ADL Eating: Minimal assistance Where Assessed-Eating: Chair Grooming: Minimal assistance Where Assessed-Grooming: Sitting at sink Upper Body Bathing: Minimal assistance Where Assessed-Upper Body Bathing: Shower Lower Body Bathing: Minimal assistance Where Assessed-Lower Body Bathing: Shower Upper Body Dressing: Minimal assistance Where Assessed-Upper Body Dressing: Sitting at sink Lower Body Dressing: Moderate assistance Where Assessed-Lower Body Dressing: Sitting at sink;Standing at sink Toileting: Minimal assistance Where Assessed-Toileting: Teacher, adult education:  Minimal assistance Toilet Transfer Method: Proofreader: Grab bars Tub/Shower Transfer: Unable to assess Tub/Shower Transfer Method: Unable to assess Film/video editor: Minimal assistance Film/video editor Method: Designer, industrial/product: Transfer tub bench;Grab bars Mobility  Bed Mobility Bed Mobility: Supine to Sit;Sit to Supine Supine to Sit: Minimal Assistance - Patient > 75% Sit to Supine: Minimal Assistance - Patient > 75% Transfers Sit to Stand: Minimal Assistance - Patient > 75% Stand to Sit: Minimal Assistance - Patient > 75%   Discharge Criteria: Patient will be discharged from OT if patient refuses treatment 3 consecutive times without medical reason, if treatment goals not met, if there is a change in medical status, if  patient makes no progress towards goals or if patient is discharged from hospital.  The above assessment, treatment plan, treatment alternatives and goals were discussed and mutually agreed upon: by patient  Collier Salina 03/08/2022, 4:35 PM

## 2022-03-08 NOTE — Evaluation (Signed)
Speech Language Pathology Assessment and Plan  Patient Details  Name: Michelle Brooks MRN: [id number] Date of Birth: 1965/03/25  SLP Diagnosis: Cognitive Impairments;Dysphagia  Rehab Potential: Good ELOS: 10-12 days    Today's Date: 03/08/2022 SLP Individual Time: 1100-1200 SLP Individual Time Calculation (min): 27 min   Hospital Problem: Principal Problem:   Right pontine cerebrovascular accident [facility])  Past Medical History:  Past Medical History:  Diagnosis Date   Chronic back pain    Diabetes mellitus type 2 in obese ([place])    Diabetic retinopathy ([place])    GERD (gastroesophageal reflux disease)    Headache    History of stroke 03/2010   Right MCA stroke in 03/2010, with MRI also noting subacute strokes in left fronto parietal area - occured in [place] received care at [facility] thought due to uncontrolled blood pressure // No residual deficits // TTE (03/2010) at [facility] - normal LV systolic function, moderate pericardial effusion with 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   HSV-2 infection    Hyperlipidemia LDL goal < 100 04/01/2011   Hypertension    Lumbago    pain in the back   Past Surgical History:  Past Surgical History:  Procedure Laterality Date   BREAST LUMPECTOMY  2002   states benign lymph node removed.   BUBBLE STUDY  08/30/2018   Procedure: BUBBLE STUDY;  Surgeon: Thayer Headings, MD;  Location: [place];  Service: Cardiovascular;;   ENDOMETRIAL BIOPSY  10/04/2020   LOOP RECORDER INSERTION N/A 08/30/2018   Procedure: LOOP RECORDER INSERTION;  Surgeon: Evans Lance, MD;  Location: [place] CV LAB;  Service: Cardiovascular;  Laterality: N/A;   TEE WITHOUT CARDIOVERSION N/A 08/30/2018   Procedure: TRANSESOPHAGEAL ECHOCARDIOGRAM (TEE);  Surgeon: Acie Fredrickson Wonda Cheng, MD;  Location: [facility] ENDOSCOPY;  Service: Cardiovascular;  Laterality: N/A;    Assessment / Plan / Recommendation Clinical Impression   Coletta Memos. Sivertsen is a 57 year old right-handed female with history of chronic back pain, diabetes mellitus, CKD stage III, obesity with BMI 40.12, hypertension, prior right MCA CVA 2012 with loop recorder insertion maintained on low-dose aspirin, hyperlipidemia, quit smoking 17 years ago.  Per chart review patient lives with boyfriend and nephew.  1 level home with multiple steps to entry.  Reportedly modified independent prior to admission.  Presented 02/26/2022 with nausea/vomiting, left eyelid droop difficulty swallowing and right side weakness.  CT/MRI showed small acute infarcts in the left lateral medulla, right pontine medullary junction and left cerebellum.  Remote infarcts in the right cerebellum and bilateral cerebral cortex.  CT angiogram head and neck in comparison to 2020 CTA, some progression of occlusive intracranial disease, suggestive of moyamoya type process.  Admission chemistries unremarkable except glucose 164 creatinine 1.09, urine drug screen negative, hemoglobin A1c 8.7.  Patient did not receive tPA.  Echocardiogram with ejection fraction of 60 to 65% no wall motion abnormalities grade 1 diastolic dysfunction.  Neurology follow-up placed on low-dose aspirin and Plavix daily x 3 months then Plavix alone.  Her diet has been advanced to mechanical soft thin liquid.  Lovenox added for DVT prophylaxis.  Patient did have episode of SVT/sinus tachycardia 1/22 given metoprolol converted to sinus tach and then to sinus rhythm.  No concern for A-fib at that time.  She was placed on metoprolol.  Therapy evaluations completed due to patient decreased functional mobility/side paresthesias was admitted for a comprehensive rehab program.   Skilled Therapeutic Interventions  Patient was seen for evaluation of dysphagia and cognitive-communication impairment.  Patient was agreeable to assessment this date.  Patient reports that she had some baseline deficits in thinking skills.  She was able to  recall that she had difficulty with memory from previous strokes.  She also reported that she was having some difficulty with swallowing.  SLP assessed patient using SLUMS and portions of an informal cognitive assessment.  Patient scored 16/30 on SLUMS with notable impairments in attention, memory, cognitive flexibility, and executive functioning (organization, self monitoring).  Patient was aware that she was having difficulty completing clock task; however, was unable to problem solve independently.  Patient was able to complete multi-step problem-solving questions with 100% accuracy this date.  SLP recommends further assessment of functional tasks such as medication management and math for  bill payment.   SLP completed clinical swallow evaluation at bedside.  Patient completed several sips via straw of thin liquids without any overt signs or symptoms of aspiration.  SLP trialed regular solid, as patient had trail mix in room.  Patient consumed 1 pecan and required cues to complete her swallow strategies for head turn and alternating liquids/solids.  Patient reported globus after eating pecan; however, with liquid rinse, globus sensation decreased.  SLP recommends continue trials of regular foods with strong adherence to compensatory strategies recommended by previous SLP.   SLP recommends skilled ST services to address cognitive communication and dysphagia.   SLP Assessment  Patient will need skilled Speech Lanaguage Pathology Services during CIR admission    Recommendations  SLP Diet Recommendations: Dysphagia 3 (Mech soft);Thin Liquid Administration via: Straw;Cup Medication Administration: Other (Comment) Supervision: Patient able to self feed Compensations: Slow rate;Small sips/bites;Multiple dry swallows after each bite/sip;Follow solids with liquid;Other (Comment) Postural Changes and/or Swallow Maneuvers: Seated upright 90 degrees Oral Care Recommendations: Oral care BID Patient  destination: Home Follow up Recommendations: Outpatient SLP Equipment Recommended: None recommended by SLP    SLP Frequency 3 to 5 out of 7 days   SLP Duration  SLP Intensity  SLP Treatment/Interventions 10-12 days  Minumum of 1-2 x/day, 30 to 90 minutes  Cognitive remediation/compensation;Environmental controls;Multimodal communication approach;Therapeutic Activities;Internal/external aids;Therapeutic Exercise;Dysphagia/aspiration precaution training;Patient/family education    Pain Pain Assessment Pain Scale: 0-10 Pain Score: 5  Pain Type: Acute pain Pain Location: Shoulder Pain Orientation: Left Pain Descriptors / Indicators: Aching;Nagging Pain Onset: With Activity Patients Stated Pain Goal: 0  Prior Functioning Cognitive/Linguistic Baseline: Baseline deficits Baseline deficit details: memory recall/"makes lists to remember" Type of Home: Apartment  Lives With: Family Available Help at Discharge: Family;Available 24 hours/day Vocation: On disability  SLP Evaluation Cognition Overall Cognitive Status: Impaired/Different from baseline Arousal/Alertness: Awake/alert Orientation Level: Oriented X4 Year: 2024 Month: January Day of Week: Correct Attention: Focused;Sustained;Selective Focused Attention: Appears intact Sustained Attention: Appears intact Selective Attention: Impaired Selective Attention Impairment: Verbal basic;Functional basic Memory: Impaired Memory Impairment: Retrieval deficit;Decreased short term memory Decreased Short Term Memory: Verbal basic;Functional basic Awareness: Appears intact Problem Solving: Appears intact Executive Function: Organizing;Self Monitoring;Self Correcting Organizing: Impaired Organizing Impairment: Verbal complex;Functional complex Self Monitoring: Impaired Self Monitoring Impairment: Functional complex Self Correcting: Impaired Safety/Judgment: Appears intact [employer] Scales of Cognitive Functioning:  Purposeful, Appropriate  Comprehension Auditory Comprehension Yes/No Questions: Within Functional Limits Commands: Impaired Multistep Basic Commands: 50-74% accurate (suspect memory) Complex Commands: 50-74% accurate Conversation: Simple Interfering Components: Attention;Working memory EffectiveTechniques: Repetition;Extra processing time Visual Recognition/Discrimination Discrimination: Not tested Reading Comprehension Reading Status: Not tested Functional Environmental (signs, name badge): Not tested Interfering Components: Other (  comment) Expression Expression Primary Mode of Expression: Verbal Verbal Expression Overall Verbal Expression: Appears within functional limits for tasks assessed Level of Generative/Spontaneous Verbalization: Conversation Naming: Not tested Pragmatics: No impairment Interfering Components: Attention;Premorbid deficit Written Expression Dominant Hand: Right Written Expression: Not tested Oral Motor    Care Tool Care Tool Cognition Ability to hear (with hearing aid or hearing appliances if normally used Ability to hear (with hearing aid or hearing appliances if normally used): 0. Adequate - no difficulty in normal conservation, social interaction, listening to TV   Expression of Ideas and Wants Expression of Ideas and Wants: 4. Without difficulty (complex and basic) - expresses complex messages without difficulty and with speech that is clear and easy to understand   Understanding Verbal and Non-Verbal Content Understanding Verbal and Non-Verbal Content: 4. Understands (complex and basic) - clear comprehension without cues or repetitions  Memory/Recall Ability Memory/Recall Ability : Current season;That he or she is in a hospital/hospital unit   PMSV Assessment  PMSV Trial    Bedside Swallowing Assessment General    Oral Care Assessment   Ice Chips Ice chips: Not tested Thin Liquid Thin Liquid: Within functional limits Presentation:  Spoon Nectar Thick Nectar Thick Liquid: Not tested Honey Thick Honey Thick Liquid: Not tested Puree Puree: Not tested Solid Solid: Impaired Oral Phase Impairments: Reduced lingual movement/coordination Oral Phase Functional Implications: Oral residue Pharyngeal Phase Impairments: Multiple swallows BSE Assessment Suspected Esophageal Findings Suspected Esophageal Findings: Belching;Globus sensation Risk for Aspiration Impact on safety and function: Mild aspiration risk Other Related Risk Factors: Previous CVA  Short Term Goals: Week 1: SLP Short Term Goal 1 (Week 1): Pt will tolerate dys 3 diet with use of compensatory strategies with minA verbal cues. SLP Short Term Goal 2 (Week 1): Pt will recall 3 memory strategies to assist with recall of important information with minA verbal cues. SLP Short Term Goal 3 (Week 1): Pt will demonstrate sustained attention to therapeutic task with occasional redirection. SLP Short Term Goal 4 (Week 1): Pt will demonstrate ability to manage medications by comprehending and completing medication management task with minA.  Refer to Care Plan for Long Term Goals  Recommendations for other services: None   Discharge Criteria: Patient will be discharged from SLP if patient refuses treatment 3 consecutive times without medical reason, if treatment goals not met, if there is a change in medical status, if patient makes no progress towards goals or if patient is discharged from hospital.  The above assessment, treatment plan, treatment alternatives and goals were discussed and mutually agreed upon: by patient  Verdene Lennert 03/08/2022, 3:45 PM

## 2022-03-08 NOTE — Progress Notes (Signed)
Inpatient Rehabilitation  Patient information reviewed and entered into eRehab system by Judeen Geralds Sinia Antosh, OTR/L, Rehab Quality Coordinator.   Information including medical coding, functional ability and quality indicators will be reviewed and updated through discharge.   

## 2022-03-08 NOTE — Progress Notes (Signed)
   03/08/22 0928  Assess: MEWS Score  Temp 97.6 F (36.4 C)  BP (!) 123/94  MAP (mmHg) 104  Pulse Rate (!) 121  Resp (!) 21  SpO2 99 %  O2 Device Room Air  Assess: MEWS Score  MEWS Temp 0  MEWS Systolic 0  MEWS Pulse 2  MEWS RR 1  MEWS LOC 0  MEWS Score 3  MEWS Score Color Yellow  Treat  Pain Scale 0-10  Pain Score 0  Take Vital Signs  Increase Vital Sign Frequency  Yellow: Q 2hr X 2 then Q 4hr X 2, if remains yellow, continue Q 4hrs  Escalate  MEWS: Escalate Yellow: discuss with charge nurse/RN and consider discussing with provider and RRT  Notify: Charge Nurse/RN  Name of Charge Nurse/RN Notified Ronita Hipps  Date Charge Nurse/RN Notified 03/08/22  Time Charge Nurse/RN Notified 4010  Provider Notification  Provider Name/Title Dr. Naaman Plummer  Date Provider Notified 03/08/22  Time Provider Notified 0930  Method of Notification Call  Notification Reason Change in status  Provider response See new orders  Date of Provider Response 03/08/22  Time of Provider Response 0932  Document  Patient Outcome Stabilized after interventions  Progress note created (see row info) Yes  Assess: SIRS CRITERIA  SIRS Temperature  0  SIRS Pulse 1  SIRS Respirations  1  SIRS WBC 0  SIRS Score Sum  2

## 2022-03-08 NOTE — Discharge Instructions (Addendum)
Inpatient Rehab Discharge Instructions  Michelle Brooks Discharge date and time: 03/14/2022   Activities/Precautions/ Functional Status: Activity: no lifting, driving, or strenuous exercise until cleared by MD Diet: diabetic diet; dysphagia 3 diet>>cut solid foods into small pieces and chew thoroughly. Avoid conversation and distractions. Wound Care: none needed Functional status:  ___ No restrictions     ___ Walk up steps independently ___ 24/7 supervision/assistance   ___ Walk up steps with assistance _x__ Intermittent supervision/assistance  ___ Bathe/dress independently ___ Walk with walker     ___ Bathe/dress with assistance ___ Walk Independently    ___ Shower independently ___ Walk with assistance    __x_ Shower with assistance __x_ No alcohol     ___ Return to work/school ________  Special Instructions: No driving, alcohol consumption or tobacco use.  STROKE/TIA DISCHARGE INSTRUCTIONS SMOKING Cigarette smoking nearly doubles your risk of having a stroke & is the single most alterable risk factor  If you smoke or have smoked in the last 12 months, you are advised to quit smoking for your health. Most of the excess cardiovascular risk related to smoking disappears within a year of stopping. Ask you doctor about anti-smoking medications [place] Quit Line: 1-800-QUIT NOW Free Smoking Cessation Classes [phone]  CHOLESTEROL Know your levels; limit fat & cholesterol in your diet  Lipid Panel     Component Value Date/Time   CHOL 174 02/27/2022 1025   CHOL 170 01/29/2021 1000   TRIG 98 02/27/2022 1025   HDL 36 (L) 02/27/2022 1025   HDL 33 (L) 01/29/2021 1000   CHOLHDL 4.8 02/27/2022 1025   VLDL 20 02/27/2022 1025   LDLCALC 118 (H) 02/27/2022 1025   LDLCALC 106 (H) 03/01/2021 0000     Many patients benefit from treatment even if their cholesterol is at goal. Goal: Total Cholesterol (CHOL) less than 160 Goal:  Triglycerides (TRIG) less than 150 Goal:  HDL greater than  40 Goal:  LDL (LDLCALC) less than 100   BLOOD PRESSURE American Stroke Association blood pressure target is less that 120/80 mm/Hg  Your discharge blood pressure is:  BP: (!) 144/70 Monitor your blood pressure Limit your salt and alcohol intake Many individuals will require more than one medication for high blood pressure  DIABETES (A1c is a blood sugar average for last 3 months) Goal HGBA1c is under 7% (HBGA1c is blood sugar average for last 3 months)  Diabetes: Diagnosis of diabetes:  Your A1c:8.7 %    Lab Results  Component Value Date   HGBA1C 8.7 (H) 02/27/2022    Your HGBA1c can be lowered with medications, healthy diet, and exercise. Check your blood sugar as directed by your physician Call your physician if you experience unexplained or low blood sugars.  PHYSICAL ACTIVITY/REHABILITATION Goal is 30 minutes at least 4 days per week  Activity: Increase activity slowly, Therapies: Physical Therapy: Home Health and Occupational Therapy: Home Health Return to work: when cleared by MD Activity decreases your risk of heart attack and stroke and makes your heart stronger.  It helps control your weight and blood pressure; helps you relax and can improve your mood. Participate in a regular exercise program. Talk with your doctor about the best form of exercise for you (dancing, walking, swimming, cycling).  DIET/WEIGHT Goal is to maintain a healthy weight  Your discharge diet is:  Diet Order             DIET DYS 3 Room service appropriate? Yes with Assist; Fluid consistency: Thin  Diet  effective now                  thin liquids Your height is:  Height: 5\' 8"  (172.7 cm) Your current weight is: Weight: 114.1 kg Your Body Mass Index (BMI) is:  BMI (Calculated): 38.26 Following the type of diet specifically designed for you will help prevent another stroke. Your goal weight range is:   Your goal Body Mass Index (BMI) is 19-24. Healthy food habits can help reduce 3 risk factors for  stroke:  High cholesterol, hypertension, and excess weight.  RESOURCES Stroke/Support Group:  Call [phone]   STROKE EDUCATION PROVIDED/REVIEWED AND GIVEN TO PATIENT Stroke warning signs and symptoms How to activate emergency medical system (call 911). Medications prescribed at discharge. Need for follow-up after discharge. Personal risk factors for stroke. Pneumonia vaccine given: No Flu vaccine given: No My questions have been answered, the writing is legible, and I understand these instructions.  I will adhere to these goals & educational materials that have been provided to me after my discharge from the hospital.     My questions have been answered and I understand these instructions. I will adhere to these goals and the provided educational materials after my discharge from the hospital.  Patient/Caregiver Signature _______________________________ Date __________  Clinician Signature _______________________________________ Date __________  Please bring this form and your medication list with you to all your follow-up doctor's appointments.    COMMUNITY REFERRALS UPON DISCHARGE:    Home Health:   PT     OT                    Agency: [facility] Phone: [phone]    Medical Equipment/Items Ordered: Wheelchair                                                 Agency/Supplier: [facility]  [phone]   Special Instructions: No driving smoking or alcohol  Continue aspirin 81 mg daily and Plavix 75 mg daily x 3 months total then Plavix alone.   My questions have been answered and I understand these instructions. I will adhere to these goals and the provided educational materials after my discharge from the hospital.  Patient/Caregiver Signature _______________________________ Date __________  Clinician Signature _______________________________________ Date __________  Please bring this form and your medication list with you to all your follow-up doctor's appointments.

## 2022-03-08 NOTE — Progress Notes (Signed)
Patient complained of shoulder pain that feels achy like her shoulder is out of socket. Explained to patient that shoulder subluxation is common after stroke. Nurse used pillow under affected limb to help with should alignment. Patient also requested diphenhydramine for facial swelling and sleep. Nurse contacted Dr Naaman Plummer concerning patients complaints. Dr ordered diphenhydramine and tramadol PRN. Nurse administered to patient. Patient resting comfortably in bed with bed in lowest setting and alarm on.

## 2022-03-08 NOTE — Plan of Care (Signed)
  Problem: RH Balance Goal: LTG Patient will maintain dynamic sitting balance (PT) Description: LTG:  Patient will maintain dynamic sitting balance with assistance during mobility activities (PT) Flowsheets (Taken 03/08/2022 1856) LTG: Pt will maintain dynamic sitting balance during mobility activities with:: Independent with assistive device  Goal: LTG Patient will maintain dynamic standing balance (PT) Description: LTG:  Patient will maintain dynamic standing balance with assistance during mobility activities (PT) Flowsheets (Taken 03/08/2022 1856) LTG: Pt will maintain dynamic standing balance during mobility activities with:: Supervision/Verbal cueing   Problem: Sit to Stand Goal: LTG:  Patient will perform sit to stand with assistance level (PT) Description: LTG:  Patient will perform sit to stand with assistance level (PT) Flowsheets (Taken 03/08/2022 1856) LTG: PT will perform sit to stand in preparation for functional mobility with assistance level: Supervision/Verbal cueing   Problem: RH Bed Mobility Goal: LTG Patient will perform bed mobility with assist (PT) Description: LTG: Patient will perform bed mobility with assistance, with/without cues (PT). Flowsheets (Taken 03/08/2022 1856) LTG: Pt will perform bed mobility with assistance level of: Independent with assistive device    Problem: RH Bed to Chair Transfers Goal: LTG Patient will perform bed/chair transfers w/assist (PT) Description: LTG: Patient will perform bed to chair transfers with assistance (PT). Flowsheets (Taken 03/08/2022 1856) LTG: Pt will perform Bed to Chair Transfers with assistance level: Supervision/Verbal cueing   Problem: RH Car Transfers Goal: LTG Patient will perform car transfers with assist (PT) Description: LTG: Patient will perform car transfers with assistance (PT). Flowsheets (Taken 03/08/2022 1856) LTG: Pt will perform car transfers with assist:: Supervision/Verbal cueing   Problem: RH  Ambulation Goal: LTG Patient will ambulate in controlled environment (PT) Description: LTG: Patient will ambulate in a controlled environment, # of feet with assistance (PT). Flowsheets (Taken 03/08/2022 1856) LTG: Pt will ambulate in controlled environ  assist needed:: Supervision/Verbal cueing LTG: Ambulation distance in controlled environment: 174ft using LRAD Goal: LTG Patient will ambulate in home environment (PT) Description: LTG: Patient will ambulate in home environment, # of feet with assistance (PT). Flowsheets (Taken 03/08/2022 1856) LTG: Pt will ambulate in home environ  assist needed:: Supervision/Verbal cueing LTG: Ambulation distance in home environment: 70ft using LRAD   Problem: RH Stairs Goal: LTG Patient will ambulate up and down stairs w/assist (PT) Description: LTG: Patient will ambulate up and down # of stairs with assistance (PT) Flowsheets (Taken 03/08/2022 1856) LTG: Pt will ambulate up/down stairs assist needed:: Supervision/Verbal cueing LTG: Pt will  ambulate up and down number of stairs: 12 steps using HRs per home set-up

## 2022-03-08 NOTE — Progress Notes (Signed)
PROGRESS NOTE   Subjective/Complaints: Doing just alright this morning. She was in her PT eval and after ambulating 31ft and 4 stairs (with breaks), she became diaphoretic per PT-- she denied any complaints. CBG 314. She was returned to her room and has returned to baseline.  Hasn't had a BM in 2 days.  Urinating well, and pain is well controlled with meds, but reports that her L shoulder has been hurting for 2 days. No specific injury. Xray yesterday showed calcifications suggestive of calcific tendinosis.   ROS: +L shoulder pain, +diaphoresis episode. Denies fevers, chills, CP, SOB, abd pain, N/V/D/C, new/worsening numbness, tingling, focal weakness, or any other complaints at this time.    Objective:   DG Shoulder Left  Result Date: 03/07/2022 CLINICAL DATA:  Acute left shoulder pain. EXAM: LEFT SHOULDER - 2+ VIEW COMPARISON:  None Available. FINDINGS: There is no evidence of fracture or dislocation. There is no evidence of arthropathy or other focal bone abnormality. Calcifications are seen projected over greater tuberosity suggesting calcific tendinosis. Soft tissues are unremarkable. IMPRESSION: Calcifications seen projected over greater tuberosity of proximal left humerus suggesting calcific tendinosis. No fracture or dislocation is noted. Electronically Signed   By: Lupita Raider M.D.   On: 03/07/2022 16:43   DG Swallowing Func-Speech Pathology  Result Date: 03/06/2022 Table formatting from the original result was not included. Objective Swallowing Evaluation: Type of Study: MBS-Modified Barium Swallow Study  Patient Details Name: Michelle Brooks MRN: [id number] Date of Birth: 1965-05-31 Today's Date: 03/06/2022 Time: SLP Start Time (ACUTE ONLY): 0915 -SLP Stop Time (ACUTE ONLY): 0930 SLP Time Calculation (min) (ACUTE ONLY): 15 min Past Medical History: Past Medical History: Diagnosis Date  Chronic back pain   Diabetes mellitus type 2  in obese (HCC)   Diabetic retinopathy (HCC)   GERD (gastroesophageal reflux disease)   Headache   History of stroke 03/2010  Right MCA stroke in 03/2010, with MRI also noting subacute strokes in left fronto parietal area - occured in [place] received care at [facility] thought due to uncontrolled blood pressure // No residual deficits // TTE (03/2010) at [facility] - normal LV systolic function, moderate pericardial effusion with 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  HSV-2 infection   Hyperlipidemia LDL goal < 100 04/01/2011  Hypertension   Lumbago   pain in the back Past Surgical History: Past Surgical History: Procedure Laterality Date  BREAST LUMPECTOMY  2002  states benign lymph node removed.  BUBBLE STUDY  08/30/2018  Procedure: BUBBLE STUDY;  Surgeon: Vesta Mixer, MD;  Location: [facility] ENDOSCOPY;  Service: Cardiovascular;;  ENDOMETRIAL BIOPSY  10/04/2020  LOOP RECORDER INSERTION N/A 08/30/2018  Procedure: LOOP RECORDER INSERTION;  Surgeon: Marinus Maw, MD;  Location: [facility] INVASIVE CV LAB;  Service: Cardiovascular;  Laterality: N/A;  TEE WITHOUT CARDIOVERSION N/A 08/30/2018  Procedure: TRANSESOPHAGEAL ECHOCARDIOGRAM (TEE);  Surgeon: Elease Hashimoto Deloris Ping, MD;  Location: [facility] ENDOSCOPY;  Service: Cardiovascular;  Laterality: N/A; HPI: Michelle Brooks is a 57 y.o. female with medical history significant of chronic back pain, DM, HTN, prior CVA, and HLD presenting with emesis.  She has a h/o prior  CVAs and these have historically presented with n/v.  She ate a lot of shrimp (no known allergy) and awoke yesterday AM with n/v.  She also noticed some dysphagia, dysarthria, tongue swelling, and eventual left-sided weakness (facial droop as well as LUE/LLE).   Her symptoms have progressed some while in the ER.  The patient appears to be more concerned about possible allergic reaction as the cause for her difficulty swallowing. AFter initial assessment MRI showed multiple  acute infarcts involving left lateral medulla, right pontomedullary junction and left cerebellum.  Subjective: "I can't swallow"  Recommendations for follow up therapy are one component of a multi-disciplinary discharge planning process, led by the attending physician.  Recommendations may be updated based on patient status, additional functional criteria and insurance authorization. Assessment / Plan / Recommendation   03/06/2022  10:16 AM Clinical Impressions Clinical Impression Pt's oropharyngeal esophageal swallow function has improved from prior MBS. Most concerning impairment is her decreased pharyngoesophageal function although it has signifcantly improved allowing increase in bolus volume to transit through the PES with each swallow. In addition she is able to produce multiple  dry swallows today which she previously had been unable to do. After initial swallows there residue ranges from moderate-max increasing with thicker viscosities. Strategies to reduce residue were effective including multiple swallows, swallows with thin barium and most significant was a head turn to the left which signifcantly reduced pyriform sinus residue. There was only one flash penetration with initial teaspoon sip of thin and protection and initiation of swallow was adequate. Her tongue base retraction, pharyngeal wall contraction in addition to decreased PES distention and duration contributed to vallecular and pyriform sinus residue. Recommend texture upgrade to Dys 3, continue thin with LEFT HEAD TURN with solids, multiple swallows and alternate liquids and solids and crush meds. 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 Impact on safety and function Mild aspiration risk     03/06/2022  10:16 AM Treatment Recommendations Treatment Recommendations Therapy as outlined in treatment plan below     03/06/2022  10:16 AM Prognosis Prognosis for  Safe Diet Advancement Good Barriers to Reach Goals Severity of deficits   03/06/2022  10:16 AM Diet Recommendations SLP Diet Recommendations Dysphagia 3 (Mech soft) solids;Thin liquid Liquid Administration via Straw;Cup Medication Administration Crushed with puree Compensations Slow rate;Small sips/bites;Follow solids with liquid;Multiple dry swallows after each bite/sip Postural Changes Seated upright at 90 degrees     03/06/2022  10:16 AM Other Recommendations Oral Care Recommendations Oral care BID Follow Up Recommendations Acute inpatient rehab (3hours/day) Functional Status Assessment Patient has had a recent decline in their functional status and demonstrates the ability to make significant improvements in function in a reasonable and predictable amount of time.   03/06/2022  10:16 AM Frequency and Duration  Speech Therapy Frequency (ACUTE ONLY) min 2x/week Treatment Duration 2 weeks     03/06/2022  10:16 AM Oral Phase Oral Phase [facility]    03/06/2022  10:16 AM Pharyngeal Phase Pharyngeal Phase Impaired Pharyngeal- Nectar Teaspoon Pharyngeal residue - valleculae;Pharyngeal residue - pyriform;Pharyngeal residue - cp segment;Reduced pharyngeal peristalsis;Reduced tongue base retraction Pharyngeal- Nectar Cup NT Pharyngeal- Thin Teaspoon Penetration/Aspiration during swallow;Pharyngeal residue - pyriform;Reduced pharyngeal peristalsis;Reduced tongue base retraction;Pharyngeal residue - cp segment;Pharyngeal residue - valleculae Pharyngeal Material enters airway, remains ABOVE vocal cords then ejected out Pharyngeal- Thin Cup Pharyngeal residue - cp segment;Pharyngeal residue - valleculae;Pharyngeal residue - pyriform;Reduced pharyngeal peristalsis;Reduced tongue base retraction Pharyngeal- Thin Straw Reduced pharyngeal peristalsis;Reduced tongue  base retraction;Pharyngeal residue - pyriform;Pharyngeal residue - valleculae;Pharyngeal residue - cp segment Pharyngeal- Puree Reduced pharyngeal peristalsis;Reduced  airway/laryngeal closure;Pharyngeal residue - pyriform;Pharyngeal residue - valleculae;Pharyngeal residue - cp segment Pharyngeal- Regular Pharyngeal residue - cp segment;Pharyngeal residue - valleculae;Pharyngeal residue - pyriform;Reduced pharyngeal peristalsis;Reduced tongue base retraction    03/06/2022  10:16 AM Cervical Esophageal Phase  Cervical Esophageal Phase -- Cervical Esophageal Comment -- [place] Siren 03/06/2022, 10:37 AM                     Recent Labs    03/07/22 0604  WBC 9.3  HGB 14.2  HCT 42.1  PLT 270   Recent Labs    03/07/22 0604  NA 135  K 4.2  CL 105  CO2 21*  GLUCOSE 230*  BUN 12  CREATININE 1.42*  CALCIUM 8.8*    Intake/Output Summary (Last 24 hours) at 03/08/2022 6387 Last data filed at 03/07/2022 1758 Gross per 24 hour  Intake 206 ml  Output --  Net 206 ml        Physical Exam: Vital Signs Blood pressure (!) 160/82, pulse 76, temperature 97.8 F (36.6 C), resp. rate 18, height 5\' 8"  (1.727 m), weight 114.1 kg, last menstrual period 03/30/2016, SpO2 92 %.  Physical Exam Constitutional:  NAD. She is obese.  HENT: [place]/AT Eyes: EOMI, Conjunctiva clear Cardiovascular: RRR, nl s1/s2, no m/r/g Pulmonary: normal effort, no distress, CTAB, no w/r/r Abdominal: Soft, NTND, +BS throughout, no r/g/r Musculoskeletal:     Cervical back: Normal range of motion.     Comments: Left arm extremely tender to PROM, shoulder seems to be main area of discomfort. Exquisitely tender to palpation diffusely throughout anterior shoulder-- ROM not attempted this morning.   Skin:    General: Skin is warm and dry.  Neurological:     Comments: Pt is alert and oriented x 3. Left lid ptosis, seems to track equally to all fields. Decreased sensation left face. Speech slightly slurred. Normal language. RUE and RLE grossly 4 to 5/5. LUE limited by pain, ?2/5. LLE 4/5. Sensation: decreased discrimination of fine touch LUE but is almost allodynic to basic palpation. LLE  sensation grossly intact to P/LT.[name] No resting tone appreciated     Patient is alert.  His eye contact with examiner.  Speech is mildly dysarthric but intelligible.  Provides name and age.  Follows simple commands.  Psychiatric:     Comments: Pt generally pleasant and cooperative   Assessment/Plan: 1. Functional deficits which require 3+ hours per day of interdisciplinary therapy in a comprehensive inpatient rehab setting. Physiatrist is providing close team supervision and 24 hour management of active medical problems listed below. Physiatrist and rehab team continue to assess barriers to discharge/monitor patient progress toward functional and medical goals  Care Tool:  Bathing              Bathing assist       Upper Body Dressing/Undressing Upper body dressing        Upper body assist      Lower Body Dressing/Undressing Lower body dressing            Lower body assist       Toileting Toileting    Toileting assist       Transfers Chair/bed transfer  Transfers assist           Locomotion Ambulation   Ambulation assist              Walk 10 feet  activity   Assist           Walk 50 feet activity   Assist           Walk 150 feet activity   Assist           Walk 10 feet on uneven surface  activity   Assist           Wheelchair     Assist               Wheelchair 50 feet with 2 turns activity    Assist            Wheelchair 150 feet activity     Assist          Blood pressure (!) 160/82, pulse 76, temperature 97.8 F (36.6 C), resp. rate 18, height 5\' 8"  (1.727 m), weight 114.1 kg, last menstrual period 03/30/2016, SpO2 92 %.  Medical Problem List and Plan: 1. 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  Antithrombotics: -DVT/anticoagulation:  Pharmaceutical: Lovenox 60mg  QD -antiplatelet therapy: Aspirin 81 mg daily and Plavix 75 mg daily x 3 months then Plavix alone 3. Severe left upper extremity/shoulder pain: likely hyperalgesia related to left lateral medullary infarct -check left shoulder xray -03/08/22 Xray showing calcifications suggestive of calcific tendinosis -trial of gabapentin 100mg  tid -voltaren gel 1% 2g TID for shoulder -Lidoderm patch 5% QD -ROM activities with therapy -Tylenol PRN, Tramadol 50mg  q6h PRN -03/08/22 ordered kpad 4. Mood/Behavior/Sleep: Provide emotional support             -antipsychotic agents: N/A 5. Neuropsych/cognition: This patient is capable of making decisions on her own behalf. 6. Skin/Wound Care: Routine skin checks 7. Fluids/Electrolytes/Nutrition: Routine in and outs with follow-up chemistries on 03/10/22 8.  Dysphagia.  Diet advanced to mechanical soft thin liquids.   -tolerating presently -advance per speech therapy 9.  Hyperlipidemia.  Lipitor 80mg  QD 10.  Diabetes mellitus.  Hemoglobin A1c 8.7.  Currently on SSI.  Prior to admission patient on DULAGLUTIDE 3 mg every Sunday and HUMALOG 50/50 80 units 3 times daily, JANUVIA 100 mg daily.  Resume as needed -03/08/22 CBGs in 210s-340s, spoke with pharmacy, recommended adding Semglee 30U QD (instead of home humalog), could consider using resistant SSI but will leave on Moderate for now; monitor  CBG (last 3)  Recent Labs    03/07/22 2134 03/08/22 0630 03/08/22 0927  GLUCAP 335* 280* 314*     11.  Hypertension/SVT.  Lopressor 25 mg twice daily.  Monitor with increased mobility -03/08/22 Had tachycardic/hypertensive/diaphoretic episode with PT this morning, increased Lopressor to 27.5mg BID and additional 25mg dose once this morning, monitor  Vitals:   03/07/22 1442 03/07/22 1442 03/07/22 1940 03/08/22 0425  BP: (!)  156/96 (!) 156/96 (!) 157/80 (!) 160/82   03/08/22 0928 03/08/22 1126  BP: (!) 123/94 112/74     12 .  Obesity.  BMI 40.12.  Dietary follow-up 13.  CKD stage IIIB.  Latest creatinine 1.28.  Follow-up chemistries -03/08/22 SCr 1.42 on 1/26 labs, f/up on Mon 03/10/22 and encourage PO hydration  14.  Constipation: -03/08/22 no BM in 2 days, ordered colace 100mg  BID, miralax 17g QD+PRN,  monitor for effect    LOS: 1 days A FACE TO FACE EVALUATION WAS PERFORMED  [address] 03/08/2022, 7:09 AM

## 2022-03-08 NOTE — Progress Notes (Signed)
Patient experienced episode of tachycardia to 130 with therapy this nurse called into room by bedside nurse to assess patient. MD notified of tachycardia with new orders to increase metoprolol to 37.5 mg Bid with one time dose of metoprolol for increased heart rate. Patient refused one time dose of metoprolol. Plan of care in progress safety ensured

## 2022-03-08 NOTE — Evaluation (Signed)
Physical Therapy Assessment and Plan  Patient Details  Name: Michelle Brooks MRN: [id number] Date of Birth: 1965-07-06  PT Diagnosis: Abnormality of gait, Ataxia, Ataxic gait, Cognitive deficits, Coordination disorder, Difficulty walking, Hemiparesis non-dominant, Impaired cognition, Impaired sensation, Muscle weakness, and Pain in L UE Rehab Potential: Good ELOS: 2-2.5 weeks   Today's Date: 03/08/2022 PT Individual Time: 0806-0929 PT Individual Time Calculation (min): 39 min    Hospital Problem: Principal Problem:   Right pontine cerebrovascular accident [facility])   Past Medical History:  Past Medical History:  Diagnosis Date   Chronic back pain    Diabetes mellitus type 2 in obese ([place])    Diabetic retinopathy ([employer])    GERD (gastroesophageal reflux disease)    Headache    History of stroke 03/2010   Right MCA stroke in 03/2010, with MRI also noting subacute strokes in left fronto parietal area - occured in [place] received care at [facility] thought due to uncontrolled blood pressure // No residual deficits // TTE (03/2010) at [facility] - normal LV systolic function, moderate pericardial effusion with 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   HSV-2 infection    Hyperlipidemia LDL goal < 100 04/01/2011   Hypertension    Lumbago    pain in the back   Past Surgical History:  Past Surgical History:  Procedure Laterality Date   BREAST LUMPECTOMY  2002   states benign lymph node removed.   BUBBLE STUDY  08/30/2018   Procedure: BUBBLE STUDY;  Surgeon: Thayer Headings, MD;  Location: [place];  Service: Cardiovascular;;   ENDOMETRIAL BIOPSY  10/04/2020   LOOP RECORDER INSERTION N/A 08/30/2018   Procedure: LOOP RECORDER INSERTION;  Surgeon: Evans Lance, MD;  Location: [place] [facility];  Service: Cardiovascular;  Laterality: N/A;   TEE WITHOUT CARDIOVERSION N/A 08/30/2018   Procedure: TRANSESOPHAGEAL ECHOCARDIOGRAM (TEE);   Surgeon: Thayer Headings, MD;  Location: [facility] ENDOSCOPY;  Service: Cardiovascular;  Laterality: N/A;    Assessment & Plan Clinical Impression: Patient is a 57 y.o. year old right-handed female with history of chronic back pain, diabetes mellitus, CKD stage III, obesity with BMI 40.12, hypertension, prior right MCA CVA 2012 with loop recorder insertion maintained on low-dose aspirin, hyperlipidemia, quit smoking 17 years ago.  Per chart review patient lives with boyfriend and nephew.  1 level home with multiple steps to entry.  Reportedly modified independent prior to admission.  Presented 02/26/2022 with nausea/vomiting, left eyelid droop difficulty swallowing and right side weakness.  CT/MRI showed small acute infarcts in the left lateral medulla, right pontine medullary junction and left cerebellum.  Remote infarcts in the right cerebellum and bilateral cerebral cortex.  CT angiogram head and neck in comparison to 2020 CTA, some progression of occlusive intracranial disease, suggestive of moyamoya type process.  Admission chemistries unremarkable except glucose 164 creatinine 1.09, urine drug screen negative, hemoglobin A1c 8.7.  Patient did not receive tPA.  Echocardiogram with ejection fraction of 60 to 65% no wall motion abnormalities grade 1 diastolic dysfunction.  Neurology follow-up placed on low-dose aspirin and Plavix daily x 3 months then Plavix alone.  Her diet has been advanced to mechanical soft thin liquid.  Lovenox added for DVT prophylaxis.  Patient did have episode of SVT/sinus tachycardia 1/22 given metoprolol converted to sinus tach and then to sinus rhythm.  No concern for A-fib at that time.  She was placed on metoprolol.  Therapy evaluations completed due to patient  decreased functional mobility/side paresthesias was admitted for a comprehensive rehab program.   Patient transferred to CIR on 03/07/2022 .   Patient currently requires mod assist with mobility secondary to muscle weakness,  decreased cardiorespiratoy endurance, impaired timing and sequencing, unbalanced muscle activation, ataxia, and decreased coordination, decreased visual motor skills,  , decreased awareness, decreased problem solving, and decreased memory, and decreased standing balance, decreased postural control, decreased balance strategies, and hemiparesis .  Prior to hospitalization, patient was independent  with mobility and lived with Family (boyfriend, Mariana Kaufman, & nephew, Vira Agar (24y.o.)) in a Apartment home.  Home access is 15Stairs to enter.  Patient will benefit from skilled PT intervention to maximize safe functional mobility, minimize fall risk, and decrease caregiver burden for planned discharge home with 24 hour supervision.  Anticipate patient will benefit from follow up OP at discharge.  PT - End of Session Activity Tolerance: Tolerates 30+ min activity with multiple rests Endurance Deficit: Yes Endurance Deficit Description: elevated HR requiring seated rest breaks PT Assessment Rehab Potential (ACUTE/IP ONLY): Good PT Barriers to Discharge: Inaccessible home environment;Home environment access/layout PT Barriers to Discharge Comments: 15 STE apartment with only L HR PT Patient demonstrates impairments in the following area(s): Balance;Perception;Behavior;Safety;Edema;Sensory;Skin Integrity;Endurance;Motor;Nutrition;Pain PT Transfers Functional Problem(s): Bed Mobility;Bed to Chair;Car;Furniture;Floor PT Locomotion Functional Problem(s): Ambulation;Stairs PT Plan PT Intensity: Minimum of 1-2 x/day ,45 to 90 minutes PT Frequency: 5 out of 7 days PT Duration Estimated Length of Stay: 2-2.5 weeks PT Treatment/Interventions: Ambulation/gait training;Community reintegration;DME/adaptive equipment instruction;Neuromuscular re-education;Psychosocial support;Stair training;UE/LE Strength taining/ROM;Wheelchair propulsion/positioning;Balance/vestibular training;Discharge planning;Functional electrical  stimulation;Pain management;Skin care/wound management;Therapeutic Activities;UE/LE Coordination activities;Cognitive remediation/compensation;Disease management/prevention;Functional mobility training;Patient/family education;Splinting/orthotics;Therapeutic Exercise;Visual/perceptual remediation/compensation PT Transfers Anticipated Outcome(s): supervision using LRAD PT Locomotion Anticipated Outcome(s): supervision using LRAD PT Recommendation Recommendations for Other Services: Therapeutic Recreation consult;Neuropsych consult Therapeutic Recreation Interventions: Outing/community reintergration;Kitchen group;Stress management Follow Up Recommendations: Outpatient PT;24 hour supervision/assistance Patient destination: Home Equipment Recommended: To be determined   PT Evaluation Precautions/Restrictions Precautions Precautions: Fall;Other (comment) Precaution Comments: L UE pain, ataxia Restrictions Weight Bearing Restrictions: No Pain Pain Assessment Pain Scale: 0-10 Pain Score: 0-No pain (states no pain currently but reports has had L shoulder pain, "aching like a tooth ache") Pain Interference Pain Interference Pain Effect on Sleep: 4. Almost constantly;3. Frequently Pain Interference with Therapy Activities: 1. Rarely or not at all Pain Interference with Day-to-Day Activities: 2. Occasionally Home Living/Prior Functioning Home Living Available Help at Discharge: Family;Available 24 hours/day (boyfriend is a veteran & does not work) Type of Home: Apartment Home Access: Stairs to enter Secretary/administrator of Steps: 15 Entrance Stairs-Rails: Left Home Layout: One level Additional Comments: pt reports she stopped using an AD ~1 year ago and that she was independent but would have a "couple of stumbles" but no true falls - states she didn't do any lifting before  Lives With: Family (boyfriend, Mariana Kaufman, & nephew, Vira Agar (24y.o.)) Prior Function Level of Independence:  Independent with gait;Independent with transfers;Independent with homemaking with ambulation  Able to Take Stairs?: Yes Driving: Yes Vocation: On disability (from CVAs) Vision/Perception  Vision - History Ability to See in Adequate Light: 1 Impaired Vision - Assessment Eye Alignment: Impaired (comment) (L eye appears to be misaligned slightly from R eye but it can track in all 4 quadrants) Tracking/Visual Pursuits: Able to track stimulus in all quads without difficulty;Other (comment) (slight jerking during upward, vertical tracking in L eye but improved on 2nd trial) Saccades: Decreased speed of saccadic movement;Other (comment);Additional eye shifts occurred during testing (pt frequently having to blink L  eye during) Perception Perception: Within Functional Limits Praxis Praxis: Intact  Cognition Overall Cognitive Status: Within Functional Limits for tasks assessed Arousal/Alertness: Awake/alert Orientation Level: Oriented to person;Oriented to place;Oriented to situation;Disoriented to time Year: 2024 Month: January Day of Week: Correct Attention: Focused;Sustained;Selective Focused Attention: Appears intact Sustained Attention: Appears intact Selective Attention: Impaired Behaviors: Perseveration Safety/Judgment: Appears intact Comments: pt does tend to perseverate on certain topics of conversation Sensation Sensation Light Touch: Appears Intact (in B LEs) Hot/Cold: Not tested Proprioception: Impaired by gross assessment (during functional mobility pt appears to have impaired awareness of L LE positioning) Stereognosis: Not tested Additional Comments: reports L UPPER side of her face feels numbness/tingling Coordination Gross Motor Movements are Fluid and Coordinated: No Fine Motor Movements are Fluid and Coordinated: No Coordination and Movement Description: GM coordination impaired in B LEs due to L hemiparesis and ataxia Finger Nose Finger Test: Unable due to limited  range tolerated in RUE Motor  Motor Motor: Hemiplegia;Abnormal tone Motor - Skilled Clinical Observations: L hemiparesis with significant pain in L shoulder   Trunk/Postural Assessment  Cervical Assessment Cervical Assessment: Exceptions to [facility] (slight forward head) Thoracic Assessment Thoracic Assessment: Exceptions to [facility] (rounded shoulders with guarding in L UE due to pain) Lumbar Assessment Lumbar Assessment: Exceptions to [facility] (flexible posterior pelvic tilt in sitting) Postural Control Postural Control: Deficits on evaluation Trunk Control: slight posterior bias  Balance Balance Balance Assessed: Yes Static Sitting Balance Static Sitting - Balance Support: Feet supported Static Sitting - Level of Assistance: 6: Modified independent (Device/Increase time) Dynamic Sitting Balance Dynamic Sitting - Balance Support: Feet supported Dynamic Sitting - Level of Assistance: 5: Stand by assistance Static Standing Balance Static Standing - Balance Support: During functional activity Static Standing - Level of Assistance: 4: Min assist Dynamic Standing Balance Dynamic Standing - Balance Support: During functional activity Dynamic Standing - Level of Assistance: 4: Min assist;3: Mod assist Extremity Assessment      RLE Assessment RLE Assessment: Within Functional Limits Active Range of Motion (AROM) Comments: WFL/WNL General Strength Comments: assessed in sitting RLE Strength Right Hip Flexion: 5/5 Right Knee Flexion: 5/5 Right Knee Extension: 5/5 Right Ankle Dorsiflexion: 5/5 Right Ankle Plantar Flexion: 5/5 LLE Assessment LLE Assessment: Exceptions to [facility] General Strength Comments: assessed in sitting LLE Strength Left Hip Flexion: 4/5 Left Knee Flexion: 4/5 Left Knee Extension: 4-/5 Left Ankle Dorsiflexion: 4+/5 Left Ankle Plantar Flexion: 4+/5 LLE Tone LLE Tone Comments: L LE ataxia noted during gait  Care Tool Care Tool Bed Mobility Roll left and right activity    Roll left and right assist level: Minimal Assistance - Patient > 75%    Sit to lying activity   Sit to lying assist level: Minimal Assistance - Patient > 75%    Lying to sitting on side of bed activity   Lying to sitting on side of bed assist level: the ability to move from lying on the back to sitting on the side of the bed with no back support.: Minimal Assistance - Patient > 75%     Care Tool Transfers Sit to stand transfer   Sit to stand assist level: Minimal Assistance - Patient > 75%    Chair/bed transfer   Chair/bed transfer assist level: Minimal Assistance - Patient > 75%     Scientist, research (physical sciences) transfer activity did not occur: Safety/medical concerns (elevated HR)        Care Tool Locomotion Ambulation   Assist level:  Minimal Assistance - Patient > 75% (required use of RW otherwise pt did not feel safe to ambulate (did not use AD baseline)) Assistive device: Walker-rolling Max distance: 10ft  Walk 10 feet activity   Assist level: Minimal Assistance - Patient > 75% Assistive device: Walker-rolling   Walk 50 feet with 2 turns activity   Assist level: Minimal Assistance - Patient > 75% Assistive device: Walker-rolling  Walk 150 feet activity Walk 150 feet activity did not occur: Safety/medical concerns      Walk 10 feet on uneven surfaces activity Walk 10 feet on uneven surfaces activity did not occur: Safety/medical concerns      Stairs   Assist level: Minimal Assistance - Patient > 75% Stairs assistive device: 2 hand rails Max number of stairs: 4  Walk up/down 1 step activity   Walk up/down 1 step (curb) assist level: Minimal Assistance - Patient > 75% Walk up/down 1 step or curb assistive device: 2 hand rails  Walk up/down 4 steps activity   Walk up/down 4 steps assist level: Minimal Assistance - Patient > 75% Walk up/down 4 steps assistive device: 2 hand rails  Walk up/down 12 steps activity Walk up/down 12 steps activity did not  occur: Safety/medical concerns      Pick up small objects from floor   Pick up small object from the floor assist level: Moderate Assistance - Patient 50 - 74%    Wheelchair Is the patient using a wheelchair?: Yes (for transport)     Wheelchair assist level: Dependent - Patient 0%    Wheel 50 feet with 2 turns activity   Assist Level: Dependent - Patient 0%  Wheel 150 feet activity   Assist Level: Dependent - Patient 0%    Refer to Care Plan for Long Term Goals  SHORT TERM GOAL WEEK 1 PT Short Term Goal 1 (Week 1): Pt will perform supine<>sit using adjustable base bed features with CGA PT Short Term Goal 2 (Week 1): Pt will perform bed<>chair transfers using LRAD with CGA PT Short Term Goal 3 (Week 1): Pt will ambulate at least 152ft using LRAD with CGA PT Short Term Goal 4 (Week 1): Pt will navigate at least 8 steps using HR with CGA PT Short Term Goal 5 (Week 1): Pt will participate in standardized outcome measure to assess her fall risk  Recommendations for other services: Neuropsych and Therapeutic Recreation  Stress management and Outing/community reintegration  Skilled Therapeutic Intervention Pt received awake supported upright in bed and agreeable to therapy session. Evaluation completed (see details above) with patient education regarding purpose of PT evaluation, PT POC and goals, therapy schedule, weekly team meetings, and other CIR information including safety plan and fall risk safety. Pt performed the below functional mobility tasks with the specified levels of skilled cuing and assistance. Pt reports not feeling safe attempting gait without AD therefore provided her with RW during session. Pt with very guarded posture during gait and extremely slow gait speed with increased time in double, limb support with L LE ataxia/incoordination and starting with step-to progressed to min reciprocal pattern. Navigated 4 stairs using R UE support on each HR (pt unable to tolerate L UE  support on HR due to L shoulder pain therefore provided HHA on that side) with cuing for step-to pattern leading with R LE on ascent and L LE on descent. Pt noted to be very diaphoretic with excessive amounts of sweating, pt reporting she is starting to have hot flashes but this is not  normal for her. Assessed HR & SpO2: HR 125-130bpm, SpO2 97-98% with pt incredibly diaphoretic Notified nurse and transported pt back to room to receive full vital assessment. Returned to supine in bed, vitals: BP 123/94 (MAP 104), HR 121bpm, SpO2 98% and requested blood sugar assessment noted to be 314. Charge nurse present to further assess patient - MD and OT made aware of pt's vitals during session. Pt left supine in bed with her boyfriend present, needs in reach, bed alarm on, and nurse present to assume her care.  Mobility Bed Mobility Bed Mobility: Supine to Sit;Sit to Supine (has an adjustable base bed at home) Supine to Sit: Minimal Assistance - Patient > 75% (for L LE management) Sit to Supine: Minimal Assistance - Patient > 75% Transfers Transfers: Sit to Stand;Stand Pivot Transfers;Stand to Sit Sit to Stand: Minimal Assistance - Patient > 75% Stand to Sit: Minimal Assistance - Patient > 75% Stand Pivot Transfers: Minimal Assistance - Patient > 75% Stand Pivot Transfer Details: Manual facilitation for weight shifting;Tactile cues for weight shifting;Tactile cues for sequencing;Tactile cues for posture;Verbal cues for technique;Verbal cues for sequencing;Verbal cues for gait pattern;Verbal cues for precautions/safety;Visual cues/gestures for sequencing Transfer (Assistive device): 1 person hand held assist Locomotion  Gait Ambulation: Yes Gait Assistance: Minimal Assistance - Patient > 75% Gait Distance (Feet): 57 Feet Assistive device: Rolling walker (pt did not feel safe trying gait without AD) Gait Assistance Details: Verbal cues for technique;Verbal cues for safe use of DME/AE;Verbal cues for  precautions/safety;Tactile cues for weight shifting;Verbal cues for sequencing;Verbal cues for gait pattern;Tactile cues for sequencing;Tactile cues for posture Gait Gait: Yes Gait Pattern: Impaired Gait Pattern: Step-to pattern;Step-through pattern;Decreased step length - left;Decreased step length - right;Decreased stride length;Poor foot clearance - left;Poor foot clearance - right;Ataxic (started with step-to pattern varying lead LE, slowly progressed to reciprocal) Gait velocity: significantly decreased with increased time in double limb support Stairs / Additional Locomotion Stairs: Yes Stairs Assistance: Minimal Assistance - Patient > 75% Stair Management Technique: Two rails;Step to pattern;Forwards (unable to use L UE support on HR due to shoulder pain so used R UE support on each HR; step-to pattern leading with R LE on ascent and L LE on descent) Number of Stairs: 4 Height of Stairs: 6 Wheelchair Mobility Wheelchair Mobility: No   Discharge Criteria: Patient will be discharged from PT if patient refuses treatment 3 consecutive times without medical reason, if treatment goals not met, if there is a change in medical status, if patient makes no progress towards goals or if patient is discharged from hospital.  The above assessment, treatment plan, treatment alternatives and goals were discussed and mutually agreed upon: by patient  Ginny Forth , PT, DPT, NCS, CSRS 03/08/2022, 8:37 AM

## 2022-03-08 NOTE — Progress Notes (Signed)
Pt would like PRN Benadryl.  Dyan Creelman Allegra Grana

## 2022-03-08 NOTE — Plan of Care (Signed)
Problem: RH Balance Goal: LTG: Patient will maintain dynamic sitting balance (OT) Description: LTG:  Patient will maintain dynamic sitting balance with assistance during activities of daily living (OT) Flowsheets (Taken 03/08/2022 1638) LTG: Pt will maintain dynamic sitting balance during ADLs with: Independent Goal: LTG Patient will maintain dynamic standing with ADLs (OT) Description: LTG:  Patient will maintain dynamic standing balance with assist during activities of daily living (OT)  Flowsheets (Taken 03/08/2022 1638) LTG: Pt will maintain dynamic standing balance during ADLs with: Independent with assistive device   Problem: Sit to Stand Goal: LTG:  Patient will perform sit to stand in prep for activites of daily living with assistance level (OT) Description: LTG:  Patient will perform sit to stand in prep for activites of daily living with assistance level (OT) Flowsheets (Taken 03/08/2022 1638) LTG: PT will perform sit to stand in prep for activites of daily living with assistance level: Independent with assistive device   Problem: RH Eating Goal: LTG Patient will perform eating w/assist, cues/equip (OT) Description: LTG: Patient will perform eating with assist, with/without cues using equipment (OT) Flowsheets (Taken 03/08/2022 1638) LTG: Pt will perform eating with assistance level of: Independent with assistive device    Problem: RH Grooming Goal: LTG Patient will perform grooming w/assist,cues/equip (OT) Description: LTG: Patient will perform grooming with assist, with/without cues using equipment (OT) Flowsheets (Taken 03/08/2022 1638) LTG: Pt will perform grooming with assistance level of: Independent with assistive device    Problem: RH Bathing Goal: LTG Patient will bathe all body parts with assist levels (OT) Description: LTG: Patient will bathe all body parts with assist levels (OT) Flowsheets (Taken 03/08/2022 1638) LTG: Pt will perform bathing with assistance  level/cueing: Supervision/Verbal cueing LTG: Position pt will perform bathing: Shower   Problem: RH Dressing Goal: LTG Patient will perform upper body dressing (OT) Description: LTG Patient will perform upper body dressing with assist, with/without cues (OT). Flowsheets (Taken 03/08/2022 1638) LTG: Pt will perform upper body dressing with assistance level of: Supervision/Verbal cueing Goal: LTG Patient will perform lower body dressing w/assist (OT) Description: LTG: Patient will perform lower body dressing with assist, with/without cues in positioning using equipment (OT) Flowsheets (Taken 03/08/2022 1638) LTG: Pt will perform lower body dressing with assistance level of: Supervision/Verbal cueing   Problem: RH Toileting Goal: LTG Patient will perform toileting task (3/3 steps) with assistance level (OT) Description: LTG: Patient will perform toileting task (3/3 steps) with assistance level (OT)  Flowsheets (Taken 03/08/2022 1638) LTG: Pt will perform toileting task (3/3 steps) with assistance level: Independent with assistive device   Problem: RH Functional Use of Upper Extremity Goal: LTG Patient will use RT/LT upper extremity as a (OT) Description: LTG: Patient will use right/left upper extremity as a stabilizer/gross assist/diminished/nondominant/dominant level with assist, with/without cues during functional activity (OT) Flowsheets (Taken 03/08/2022 1638) LTG: Use of upper extremity in functional activities: RUE as gross assist level LTG: Pt will use upper extremity in functional activity with assistance level of: Minimal Assistance - Patient > 75%   Problem: RH Simple Meal Prep Goal: LTG Patient will perform simple meal prep w/assist (OT) Description: LTG: Patient will perform simple meal prep with assistance, with/without cues (OT). Flowsheets (Taken 03/08/2022 1638) LTG: Pt will perform simple meal prep with assistance level of: Supervision/Verbal cueing LTG: Pt will perform simple  meal prep w/level of: Ambulate with device   Problem: RH Toilet Transfers Goal: LTG Patient will perform toilet transfers w/assist (OT) Description: LTG: Patient will perform toilet transfers with  assist, with/without cues using equipment (OT) Flowsheets (Taken 03/08/2022 1638) LTG: Pt will perform toilet transfers with assistance level of: Independent with assistive device   Problem: RH Tub/Shower Transfers Goal: LTG Patient will perform tub/shower transfers w/assist (OT) Description: LTG: Patient will perform tub/shower transfers with assist, with/without cues using equipment (OT) Flowsheets (Taken 03/08/2022 1638) LTG: Pt will perform tub/shower stall transfers with assistance level of: Minimal Assistance - Patient > 75% LTG: Pt will perform tub/shower transfers from: Tub/shower combination   Problem: RH Memory Goal: LTG Patient will demonstrate ability for day to day recall/carry over during activities of daily living with assistance level (OT) Description: LTG:  Patient will demonstrate ability for day to day recall/carry over during activities of daily living with assistance level (OT). Flowsheets (Taken 03/08/2022 1638) LTG:  Patient will demonstrate ability for day to day recall/carry over during activities of daily living with assistance level (OT): Supervision   Problem: RH Attention Goal: LTG Patient will demonstrate this level of attention during functional activites (OT) Description: LTG:  Patient will demonstrate this level of attention during functional activites  (OT) Flowsheets (Taken 03/08/2022 1638) Patient will demonstrate this level of attention during functional activites: Alternating Patient will demonstrate above attention level in the following environment: Home LTG: Patient will demonstrate this level of attention during functional activites (OT): Supervision

## 2022-03-09 DIAGNOSIS — E1165 Type 2 diabetes mellitus with hyperglycemia: Secondary | ICD-10-CM

## 2022-03-09 DIAGNOSIS — K5901 Slow transit constipation: Secondary | ICD-10-CM

## 2022-03-09 LAB — GLUCOSE, CAPILLARY
Glucose-Capillary: 293 mg/dL — ABNORMAL HIGH (ref 70–99)
Glucose-Capillary: 255 mg/dL — ABNORMAL HIGH (ref 70–99)
Glucose-Capillary: 287 mg/dL — ABNORMAL HIGH (ref 70–99)
Glucose-Capillary: 288 mg/dL — ABNORMAL HIGH (ref 70–99)

## 2022-03-09 MED ORDER — INSULIN ASPART 100 UNIT/ML IJ SOLN
0.0000 [IU] | Freq: Every day | INTRAMUSCULAR | Status: DC
  Administered 2022-03-09 – 2022-03-11 (×3): 3 [IU] via SUBCUTANEOUS

## 2022-03-09 MED ORDER — INSULIN ASPART 100 UNIT/ML IJ SOLN
0.0000 [IU] | Freq: Three times a day (TID) | INTRAMUSCULAR | Status: DC
  Administered 2022-03-09 – 2022-03-10 (×3): 8 [IU] via SUBCUTANEOUS
  Administered 2022-03-10: 5 [IU] via SUBCUTANEOUS
  Administered 2022-03-11: 8 [IU] via SUBCUTANEOUS
  Administered 2022-03-11: 11 [IU] via SUBCUTANEOUS
  Administered 2022-03-11: 8 [IU] via SUBCUTANEOUS

## 2022-03-09 NOTE — Progress Notes (Signed)
LBM documented 1/23. Bowel sounds active. Patient refused colace this evening. Education reinforced.

## 2022-03-09 NOTE — Progress Notes (Signed)
PROGRESS NOTE   Subjective/Complaints: Doing well this morning, states Kpad helped a lot with her L shoulder pain. Hasn't had a BM in 3 days, still passing gas, and doesn't really want anything additional for it (already on Colace BID, Miralax QD started yesterday). Urinating well, sleeping well, denies any other complaints. No further diaphoretic episodes.    ROS: +L shoulder pain-improving, +diaphoresis episode-none further, +constipation. Denies fevers, chills, CP, SOB, abd pain, N/V/D, new/worsening paresthesias/weakness, or any other complaints at this time.    Objective:   DG Shoulder Left  Result Date: 03/07/2022 CLINICAL DATA:  Acute left shoulder pain. EXAM: LEFT SHOULDER - 2+ VIEW COMPARISON:  None Available. FINDINGS: There is no evidence of fracture or dislocation. There is no evidence of arthropathy or other focal bone abnormality. Calcifications are seen projected over greater tuberosity suggesting calcific tendinosis. Soft tissues are unremarkable. IMPRESSION: Calcifications seen projected over greater tuberosity of proximal left humerus suggesting calcific tendinosis. No fracture or dislocation is noted. Electronically Signed   By: Lupita Raider M.D.   On: 03/07/2022 16:43   Recent Labs    03/07/22 0604  WBC 9.3  HGB 14.2  HCT 42.1  PLT 270    Recent Labs    03/07/22 0604  NA 135  K 4.2  CL 105  CO2 21*  GLUCOSE 230*  BUN 12  CREATININE 1.42*  CALCIUM 8.8*     Intake/Output Summary (Last 24 hours) at 03/09/2022 0714 Last data filed at 03/08/2022 1817 Gross per 24 hour  Intake 1058 ml  Output --  Net 1058 ml         Physical Exam: Vital Signs Blood pressure 127/65, pulse 69, temperature 98.4 F (36.9 C), resp. rate 17, height 5\' 8"  (1.727 m), weight 114.1 kg, last menstrual period 03/30/2016, SpO2 97 %.  Physical Exam Constitutional:  NAD. She is obese. Sleepy but arousable. Laying in bed.   HENT: [place]/AT Eyes: EOMI, Conjunctiva clear Cardiovascular: RRR, nl s1/s2, no m/r/g Pulmonary: normal effort, no distress, CTAB, no w/r/r Abdominal: Soft, NTND, +BS throughout, no r/g/r Musculoskeletal:     Cervical back: Normal range of motion.     Comments: Left arm extremely tender to PROM, shoulder seems to be main area of discomfort. Tenderness improving, ROM not attempted  Skin:    General: Skin is warm and dry.  Neurological:     Comments: Pt is oriented x 3. Left lid ptosis, seems to track equally to all fields. Decreased sensation left face. Speech slightly slurred. Normal language. RUE and RLE grossly 4 to 5/5. LUE limited by pain, ?2/5. LLE 4/5. Sensation: decreased discrimination of fine touch LUE but is almost allodynic to basic palpation. LLE sensation grossly intact to P/LT.04/01/2016 No resting tone appreciated     Patient is alert.  Has eye contact with examiner.  Speech is mildly dysarthric but intelligible.  Provides name and age.  Follows simple commands.  Psychiatric:     Comments: Pt generally pleasant and cooperative   Assessment/Plan: 1. Functional deficits which require 3+ hours per day of interdisciplinary therapy in a comprehensive inpatient rehab setting. Physiatrist is providing close team supervision and 24 hour management of active  medical problems listed below. Physiatrist and rehab team continue to assess barriers to discharge/monitor patient progress toward functional and medical goals  Care Tool:  Bathing    Body parts bathed by patient: Chest, Abdomen, Front perineal area, Right upper leg, Left upper leg, Face, Buttocks   Body parts bathed by helper: Buttocks, Left lower leg, Right lower leg     Bathing assist Assist Level: Moderate Assistance - Patient 50 - 74%     Upper Body Dressing/Undressing Upper body dressing   What is the patient wearing?: Pull over shirt    Upper body assist Assist Level: Minimal Assistance - Patient > 75%    Lower Body  Dressing/Undressing Lower body dressing      What is the patient wearing?: Underwear/pull up, Pants     Lower body assist Assist for lower body dressing: Moderate Assistance - Patient 50 - 74%     Toileting Toileting    Toileting assist Assist for toileting: Minimal Assistance - Patient > 75%     Transfers Chair/bed transfer  Transfers assist     Chair/bed transfer assist level: Minimal Assistance - Patient > 75%     Locomotion Ambulation   Ambulation assist      Assist level: Minimal Assistance - Patient > 75% (required use of RW otherwise pt did not feel safe to ambulate (did not use AD baseline)) Assistive device: Walker-rolling Max distance: 57ft   Walk 10 feet activity   Assist     Assist level: Minimal Assistance - Patient > 75% Assistive device: Walker-rolling   Walk 50 feet activity   Assist    Assist level: Minimal Assistance - Patient > 75% Assistive device: Walker-rolling    Walk 150 feet activity   Assist Walk 150 feet activity did not occur: Safety/medical concerns         Walk 10 feet on uneven surface  activity   Assist Walk 10 feet on uneven surfaces activity did not occur: Safety/medical concerns         Wheelchair     Assist Is the patient using a wheelchair?: Yes (for transport)      Wheelchair assist level: Dependent - Patient 0%      Wheelchair 50 feet with 2 turns activity    Assist        Assist Level: Dependent - Patient 0%   Wheelchair 150 feet activity     Assist      Assist Level: Dependent - Patient 0%   Blood pressure 127/65, pulse 69, temperature 98.4 F (36.9 C), resp. rate 17, height 5\' 8"  (1.727 m), weight 114.1 kg, last menstrual period 03/30/2016, SpO2 97 %.  Medical Problem List and Plan: 1. 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  Antithrombotics: -DVT/anticoagulation:  Pharmaceutical: Lovenox 60mg  QD -antiplatelet therapy: Aspirin 81 mg daily and Plavix 75 mg daily x 3 months then Plavix alone 3. Severe left upper extremity/shoulder pain: likely hyperalgesia related to left lateral medullary infarct -03/08/22 Xray showing calcifications suggestive of calcific tendinosis -trial of gabapentin 100mg  tid -voltaren gel 1% 2g TID for shoulder -Lidoderm patch 5% QD -ROM activities with therapy -Tylenol PRN, Tramadol 50mg  q6h PRN -03/08/22 ordered kpad--helping 03/09/22  4. Mood/Behavior/Sleep: Provide emotional support             -antipsychotic agents: N/A 5. Neuropsych/cognition: This patient is capable of making decisions on her own behalf. 6. Skin/Wound Care: Routine skin checks 7. Fluids/Electrolytes/Nutrition: Routine in and outs with follow-up chemistries on 03/10/22 8.  Dysphagia.  Diet advanced to mechanical soft thin liquids.   -tolerating presently -advance per speech therapy 9.  Hyperlipidemia.  Lipitor 80mg  QD 10.  Diabetes mellitus.  Hemoglobin A1c 8.7.  Currently on SSI.  Prior to admission patient on DULAGLUTIDE 3 mg every "Sunday and HUMALOG 50/50 80 units 3 times daily, JANUVIA 100 mg daily.  Resume as needed -03/08/22 CBGs in 210s-340s, spoke with pharmacy, recommended adding Semglee 30U QD (instead of home humalog), could consider using resistant SSI but will leave on Moderate for now; monitor -03/09/22 CBGS down to 240-290s, today starting with qAM Semglee, so will hold off on Semglee adjustments for now, will add QHS SSI to help with improved control  CBG (last 3)  Recent Labs    03/08/22 1642 03/08/22 2118 03/09/22 0555  GLUCAP 247* 256* 293*      11.  Hypertension/SVT.  Lopressor 25 mg twice daily.  Monitor with increased mobility -03/08/22 Had  tachycardic/hypertensive/diaphoretic episode with PT this morning, increased Lopressor to 27.5mg BID and additional 25mg dose once this morning, monitor -03/09/22 BPs and HR downtrending overnight, slightly up this morning, received 25mg lopressor at 7:30am and 37.5mg at 2131pm yesterday; monitor for now, if uptrending then could consider addt'l antihypertensive  Vitals:   03/07/22 1442 03/07/22 1442 03/07/22 1940 03/08/22 0425  BP: (!) 156/96 (!) 156/96 (!) 157/80 (!) 160/82   03/08/22 0928 03/08/22 1126 03/08/22 1401 03/08/22 1641  BP: (!) 123/94 112/74 106/85 116/76   03/08/22 2122 03/09/22 0156 03/09/22 0840  BP: 117/84 127/65 (!) 155/93     12" .  Obesity.  BMI 40.12.  Dietary follow-up 13.  CKD stage IIIB.  Latest creatinine 1.28.  Follow-up chemistries -03/08/22 SCr 1.42 on 1/26 labs, f/up on Mon 03/10/22 and encourage PO hydration-- wants bottled water, advised to have family bring this  60.  Constipation: -03/08/22 no BM in 2 days, ordered colace 100mg  BID, miralax 17g QD+PRN, monitor for effect -03/09/22 still no BM, pt resistant to added meds for now, advised that if no BM today, may need to increase regimen tomorrow; monitor    LOS: 2 days A FACE TO [place] 03/09/2022, 7:14 AM

## 2022-03-10 LAB — COMPREHENSIVE METABOLIC PANEL
ALT: 12 U/L (ref 0–44)
AST: 12 U/L — ABNORMAL LOW (ref 15–41)
Albumin: 2.8 g/dL — ABNORMAL LOW (ref 3.5–5.0)
Alkaline Phosphatase: 99 U/L (ref 38–126)
Anion gap: 9 (ref 5–15)
BUN: 16 mg/dL (ref 6–20)
CO2: 23 mmol/L (ref 22–32)
Calcium: 8.6 mg/dL — ABNORMAL LOW (ref 8.9–10.3)
Chloride: 103 mmol/L (ref 98–111)
Creatinine, Ser: 1.14 mg/dL — ABNORMAL HIGH (ref 0.44–1.00)
GFR, Estimated: 56 mL/min — ABNORMAL LOW (ref >60)
Glucose, Bld: 230 mg/dL — ABNORMAL HIGH (ref 70–99)
Potassium: 3.7 mmol/L (ref 3.5–5.1)
Sodium: 135 mmol/L (ref 135–145)
Total Bilirubin: 0.3 mg/dL (ref 0.3–1.2)
Total Protein: 6.1 g/dL — ABNORMAL LOW (ref 6.5–8.1)

## 2022-03-10 LAB — CBC WITH DIFFERENTIAL/PLATELET
Abs Immature Granulocytes: 0.02 10*3/uL (ref 0.00–0.07)
Basophils Absolute: 0 10*3/uL (ref 0.0–0.1)
Basophils Relative: 1 %
Eosinophils Absolute: 0.3 10*3/uL (ref 0.0–0.5)
Eosinophils Relative: 4 %
HCT: 39.5 % (ref 36.0–46.0)
Hemoglobin: 13.4 g/dL (ref 12.0–15.0)
Immature Granulocytes: 0 %
Lymphocytes Relative: 30 %
Lymphs Abs: 2.5 10*3/uL (ref 0.7–4.0)
MCH: 28.9 pg (ref 26.0–34.0)
MCHC: 33.9 g/dL (ref 30.0–36.0)
MCV: 85.1 fL (ref 80.0–100.0)
Monocytes Absolute: 0.6 10*3/uL (ref 0.1–1.0)
Monocytes Relative: 7 %
Neutro Abs: 4.9 10*3/uL (ref 1.7–7.7)
Neutrophils Relative %: 58 %
Platelets: 301 10*3/uL (ref 150–400)
RBC: 4.64 MIL/uL (ref 3.87–5.11)
RDW: 13 % (ref 11.5–15.5)
WBC: 8.4 10*3/uL (ref 4.0–10.5)
nRBC: 0 % (ref 0.0–0.2)

## 2022-03-10 LAB — GLUCOSE, CAPILLARY
Glucose-Capillary: 255 mg/dL — ABNORMAL HIGH (ref 70–99)
Glucose-Capillary: 204 mg/dL — ABNORMAL HIGH (ref 70–99)
Glucose-Capillary: 259 mg/dL — ABNORMAL HIGH (ref 70–99)
Glucose-Capillary: 291 mg/dL — ABNORMAL HIGH (ref 70–99)

## 2022-03-10 MED ORDER — INSULIN GLARGINE-YFGN 100 UNIT/ML ~~LOC~~ SOLN
35.0000 [IU] | Freq: Every day | SUBCUTANEOUS | Status: DC
  Administered 2022-03-11: 35 [IU] via SUBCUTANEOUS
  Filled 2022-03-10: qty 0.35

## 2022-03-10 MED ORDER — CHOLECALCIFEROL 10 MCG (400 UNIT) PO TABS
400.0000 [IU] | ORAL_TABLET | Freq: Every day | ORAL | Status: DC
  Administered 2022-03-10 – 2022-03-15 (×6): 400 [IU] via ORAL
  Filled 2022-03-10 (×6): qty 1

## 2022-03-10 NOTE — Progress Notes (Signed)
Occupational Therapy Session Note  Patient Details  Name: Michelle Brooks MRN: [id number] Date of Birth: [birth year]  Today's Date: 03/10/2022 OT Individual Time: 1000-1108 OT Individual Time Calculation (min): 68 min    Short Term Goals: Week 1:  OT Short Term Goal 1 (Week 1): Patient will demonstrate 30 degrees of active assisted right shoulder flexion in gravity eliminated position with no more than 2 point increase in pain OT Short Term Goal 2 (Week 1): Patient will complete toileting with contact guard assist OT Short Term Goal 3 (Week 1): Patienyt will complete tub shower transfer with min assist with use of tub trasnfer bench OT Short Term Goal 4 (Week 1): Patient will utilize Right arm motion to place into shirt sleeve with min cueing OT Short Term Goal 5 (Week 1): Patient will dress lower body (excluding TED hose) with intermittent  min assist  Skilled Therapeutic Interventions/Progress Updates:     Pt received semi-reclined in bed with husband present in room. Pt  eating dried walnuts upon OT arrival, however Pt currently on Dys 3 diet in which dried nut are not included. Pt educated on following  Dys 3 diet verbalizing understanding, however unsure if Pt will comply to Dys 3 diet as she stated "I am not having trouble eating them, I have been doing it this entire time". Consulted with SLP and informed her of Pt status- will continue to provide education on Dys 3 diet to support carry over from SLP session and increase Pt safety.  Pt requesting to take shower during session this AM. Pt transitioned to EOB CGA using bed features (bed rails). Pt sit>stand using RW light min A with mod cues for technique and safety. Pt ambulated to bathroom and transferred to toilet using RW CGA with min cues for technique. Provided increased time on toilet d/t Pt reporting she felt need for BM, unsuccessful at this time (continent void in toilet). Pt able to complete anterior peri care and clothing management  with CGA +time and mod cues for safety. Pt ambulated to shower and transferred to tub bench CGA using RW with min cues for technique. Pt able to complete U/LB bathing with close supervision to CGA when standing to wash bottom while holding grab bars. Pt utilizing LUE during ~25% of task when washing R arm and upper legs. Pt dried self following shower in sitting and partial standing while holding grab bars. Pt ambulated to EOB for dressing tasks. Pt educated on hemi-dressing technique donning shirt with supervision and pants with CGA when standing with RW to bring pants to waist. TED hose donned on Pt max A. Pt able to donn socks seated EOB CGA.  Pt completed stand pivot transfer to WC using RW CGA and min cues for technique. Pt able to complete grooming hygiene tasks seated at sink with supervision utilizing BUE for manipulating toiletry items with mod cueing.  Pt politely refusing functional mobility at end of session d/t not wanting to get sweaty despite maximal motivation provided. Pt educated on hospital safety, seat belt alarm, and use of call bell at end of session. Pt was left resting in wc with call bell in reach, seat belt alarm on, and all needs met.   Therapy Documentation Precautions:  Precautions Precautions: Fall Precaution Comments: L UE pain, ataxia Restrictions Weight Bearing Restrictions: No General:   Vital Signs:  Pain: Pain Assessment Pain Scale: 0-10 Pain Score: 4  Pain Type: Acute pain Pain Orientation: Left Pain Descriptors / Indicators: Aching Pain  Onset: On-going Pain Intervention(s): Medication (See eMAR) Multiple Pain Sites: No  Therapy/Group: Individual Therapy  Janey Genta 03/10/2022, 10:20 AM

## 2022-03-10 NOTE — Progress Notes (Signed)
Inpatient Rehabilitation Care Coordinator Assessment and Plan Patient Details  Name: Michelle Brooks MRN: [id number] Date of Birth: [birth year]  Today's Date: 03/10/2022  Hospital Problems: Principal Problem:   Right pontine cerebrovascular accident [facility])  Past Medical History:  Past Medical History:  Diagnosis Date   Chronic back pain    Diabetes mellitus type 2 in obese ([place])    Diabetic retinopathy ([place])    GERD (gastroesophageal reflux disease)    Headache    History of stroke 03/2010   Right MCA stroke in 03/2010, with MRI also noting subacute strokes in left fronto parietal area - occured in [place] received care at [facility] thought due to uncontrolled blood pressure // No residual deficits // TTE (03/2010) at [facility] - normal LV systolic function, moderate pericardial effusion with 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   HSV-2 infection    Hyperlipidemia LDL goal < 100 04/01/2011   Hypertension    Lumbago    pain in the back   Past Surgical History:  Past Surgical History:  Procedure Laterality Date   BREAST LUMPECTOMY  2002   states benign lymph node removed.   BUBBLE STUDY  08/30/2018   Procedure: BUBBLE STUDY;  Surgeon: Thayer Headings, MD;  Location: [place];  Service: Cardiovascular;;   ENDOMETRIAL BIOPSY  10/04/2020   LOOP RECORDER INSERTION N/A 08/30/2018   Procedure: LOOP RECORDER INSERTION;  Surgeon: Evans Lance, MD;  Location: [place] CV LAB;  Service: Cardiovascular;  Laterality: N/A;   TEE WITHOUT CARDIOVERSION N/A 08/30/2018   Procedure: TRANSESOPHAGEAL ECHOCARDIOGRAM (TEE);  Surgeon: Acie Fredrickson Wonda Cheng, MD;  Location: [facility] ENDOSCOPY;  Service: Cardiovascular;  Laterality: N/A;   Social History:  reports that she quit smoking about 17 years ago. Her smoking use included cigarettes. She has a 1.40 pack-year smoking history. She has never used smokeless tobacco. She reports that she does not drink  alcohol and does not use drugs.  Family / Support Systems Spouse/Significant Other: S.O., [name] Children: Daughter-27 Other Supports: Nephew Anticipated Caregiver: Alvira Monday and Daughter Ability/Limitations of Caregiver: N/A Caregiver Availability: 24/7 Family Dynamics: Support from family  Social History Preferred language: English Religion: Pentecostal Cultural Background: Patient has been disabled a while due to past strokes. Lives with S.O, daughter and nephew . Education: [place] - How often do you need to have someone help you when you read instructions, pamphlets, or other written material from your doctor or pharmacy?: Never Writes: Yes Employment Status: Unemployed Date Retired/Disabled/Unemployed: 2011 Legal History/Current Legal Issues: N/A Guardian/Conservator: N/A   Abuse/Neglect Abuse/Neglect Assessment Can Be Completed: Yes Physical Abuse: Denies Verbal Abuse: Denies Sexual Abuse: Denies Exploitation of patient/patient's resources: Denies Self-Neglect: Denies  Patient response to: Social Isolation - How often do you feel lonely or isolated from those around you?: Never  Emotional Status Pt's affect, behavior and adjustment status: Patient shares she is ready to get back home ASAP. Recent Psychosocial Issues: Coping Psychiatric History: N/A Substance Abuse History: N/A  Patient / Family Perceptions, Expectations & Goals Pt/Family understanding of illness & functional limitations: yes Premorbid pt/family roles/activities: Independent and disbabled Anticipated changes in roles/activities/participation: Nephew and daughter able to assist physically if needed and someone will be home 24/7 Pt/family expectations/goals: Supervision to [place]: None Premorbid Home Care/DME Agencies: Other (Comment) Librarian, academic, Civil engineer, contracting, rollator, [facility]) Transportation available at discharge: Daughter able to  transport patient Is the patient able  to respond to transportation needs?: Yes In the past 12 months, has lack of transportation kept you from medical appointments or from getting medications?: No In the past 12 months, has lack of transportation kept you from meetings, work, or from getting things needed for daily living?: No Resource referrals recommended: Neuropsychology  Discharge Planning Living Arrangements: Spouse/significant other, Children, Other relatives Support Systems: Spouse/significant other, Children, Other relatives Type of Residence: Private residence (15 steps to enter) Insurance Resources: Multimedia programmer (specify) Financial Resources: [employer] Financial Screen Referred: No Living Expenses: Education officer, community Management: Patient Does the patient have any problems obtaining your medications?: No Home Management: IndependenT Patient/Family Preliminary Plans: Plans to manage Care Coordinator Barriers to Discharge: Inaccessible home environment, Insurance for SNF coverage, Home environment access/layout Care Coordinator Anticipated Follow Up Needs: HH/OP Expected length of stay: 10-12 Days  Clinical Impression SW met with patient, introduced self and explained role. Patient is flat and requiring encouragement and has expressed that she would prefer to be home. Patient expressed that she has been through this several times and shares she would rather be at home completing therapies. Patient will be discharging home with her S.O, daughter and nephew. Daughter is patient's primary contact. Patient has 15 to enter the home. Sw will provide daughter and patient with updates on Wednesday. No additional question or concerns.    Dyanne Iha 03/10/2022, 1:42 PM

## 2022-03-10 NOTE — Progress Notes (Addendum)
Physical Therapy Session Note  Patient Details  Name: Michelle Brooks MRN: [id number] Date of Birth: [birth year]  Today's Date: 03/10/2022 PT Individual Time: 7425-9563 PT Individual Time Calculation (min): 45 min   Short Term Goals: Week 1:  PT Short Term Goal 1 (Week 1): Pt will perform supine<>sit using adjustable base bed features with CGA PT Short Term Goal 2 (Week 1): Pt will perform bed<>chair transfers using LRAD with CGA PT Short Term Goal 3 (Week 1): Pt will ambulate at least 126ft using LRAD with CGA PT Short Term Goal 4 (Week 1): Pt will navigate at least 8 steps using HR with CGA PT Short Term Goal 5 (Week 1): Pt will participate in standardized outcome measure to assess her fall risk  Skilled Therapeutic Interventions/Progress Updates:    Chart reviewed. Pt received seated on toilet with RN present and 4/10 c/o pain in L shoulder. Session focused on functional transfers to promote home access and motivation discussions to promote participation in necessary rehabilitation therapies. Pt initiated session with amb from toilet to [place] using CGA + RW. Pt then expressed strong frustration with repeated strokes, noting at least 3 strokes (2012, 2016, 2024). Pt repeated multiple times frustration and confusion as to why she experienced sudden inability to swallow at time of stroke. PT and pt discussed need to participate with py understanding, but expressing lack of motivation to participate until understanding reason for stroke. PT and pt continued discussing stroke risk factors, but pt interested in reason for the present episode. PT sent secure chat to MD to relay concern. PT and pt then again discussed need to participate in future sessions. Pt refused to continue therapy further, but expressed understanding of need to make up time later. Pt then completed amb of 96ft to bed with CGA + RW for sit to stand and amb. Pt required supervision for sit to supine into bed. PT made final note to pt that  current mobility is at a strong level to promote functional recovery and emphasized need for early PT to maximize recovery. At end of session, pt was left semi-reclined in bed with alarm engaged, nurse call bell and all needs in reach.     Therapy Documentation Precautions:  Precautions Precautions: Fall Precaution Comments: L UE pain, ataxia Restrictions Weight Bearing Restrictions: No General: PT Amount of Missed Time (min): 30 Minutes PT Missed Treatment Reason: Patient unwilling to participate    Therapy/Group: Individual Therapy  Marquette Old, PT, DPT 03/10/2022, 2:04 PM

## 2022-03-10 NOTE — Progress Notes (Signed)
Patient ID: Michelle Brooks, female   DOB: 1965/07/06, 57 y.o.   MRN: [id number] Met with the patient to review current situation (5th stroke) just had loop recorder removed 01/29/22. Sad that she had progressed from a RW to [facility] to no AD and now is back to needing a walker to get around. Reviewed secondary risks including HTN. HLD and DM along with DAPT x 3 months then Plavix solo per MD and dietary modification recommendations. Continue to follow along to address educational needs to facilitate preparation for discharge. Margarito Liner

## 2022-03-10 NOTE — Progress Notes (Addendum)
PROGRESS NOTE   Subjective/Complaints:  Left shoulder pain , improving with heat but still limiting movement ROS: +L shoulder pain-no numbness or swelling    Objective:   No results found. Recent Labs    03/10/22 0545  WBC 8.4  HGB 13.4  HCT 39.5  PLT 301    Recent Labs    03/10/22 0545  NA 135  K 3.7  CL 103  CO2 23  GLUCOSE 230*  BUN 16  CREATININE 1.14*  CALCIUM 8.6*     Intake/Output Summary (Last 24 hours) at 03/10/2022 0814 Last data filed at 03/09/2022 1812 Gross per 24 hour  Intake 720 ml  Output --  Net 720 ml         Physical Exam: Vital Signs Blood pressure (!) 158/82, pulse 82, temperature 98.2 F (36.8 C), temperature source Oral, resp. rate 18, height 5\' 8"  (1.727 m), weight 114.1 kg, last menstrual period 03/30/2016, SpO2 94 %.  Physical Exam  General: No acute distress Mood and affect are appropriate Heart: Regular rate and rhythm no rubs murmurs or extra sounds Lungs: Clear to auscultation, breathing unlabored, no rales or wheezes Abdomen: Positive bowel sounds, soft nontender to palpation, nondistended Extremities: No clubbing, cyanosis, or edema   Musculoskeletal:     Cervical back: Normal range of motion.     Comments: Left arm extremely tender to PROM, shoulder seems to be main area of discomfort. Tenderness improving, ROM not attempted  Skin:    General: Skin is warm and dry.  Neurological:     Comments: Pt is oriented x 3. Left lid ptosis, seems to track equally to all fields. Decreased sensation left face. Speech slightly slurred. Normal language. RUE and RLE grossly 4 to 5/5. LUE limited by pain, ?2/5. LLE 4/5.  MSK- pain over L trap, pain with elevation of Left shoulder      Patient is alert.  Has eye contact with examiner.  Speech is mildly dysarthric but intelligible.  Provides name and age.  Follows simple commands.  Psychiatric:     Comments: Pt generally  pleasant and cooperative   Assessment/Plan: 1. Functional deficits which require 3+ hours per day of interdisciplinary therapy in a comprehensive inpatient rehab setting. Physiatrist is providing close team supervision and 24 hour management of active medical problems listed below. Physiatrist and rehab team continue to assess barriers to discharge/monitor patient progress toward functional and medical goals  Care Tool:  Bathing    Body parts bathed by patient: Chest, Abdomen, Front perineal area, Right upper leg, Left upper leg, Face, Buttocks   Body parts bathed by helper: Buttocks, Left lower leg, Right lower leg     Bathing assist Assist Level: Moderate Assistance - Patient 50 - 74%     Upper Body Dressing/Undressing Upper body dressing   What is the patient wearing?: Pull over shirt    Upper body assist Assist Level: Minimal Assistance - Patient > 75%    Lower Body Dressing/Undressing Lower body dressing      What is the patient wearing?: Underwear/pull up, Pants     Lower body assist Assist for lower body dressing: Moderate Assistance - Patient 50 - 74%  Toileting Toileting    Toileting assist Assist for toileting: Minimal Assistance - Patient > 75%     Transfers Chair/bed transfer  Transfers assist     Chair/bed transfer assist level: Minimal Assistance - Patient > 75%     Locomotion Ambulation   Ambulation assist      Assist level: Minimal Assistance - Patient > 75% (required use of RW otherwise pt did not feel safe to ambulate (did not use AD baseline)) Assistive device: Walker-rolling Max distance: 79ft   Walk 10 feet activity   Assist     Assist level: Minimal Assistance - Patient > 75% Assistive device: Walker-rolling   Walk 50 feet activity   Assist    Assist level: Minimal Assistance - Patient > 75% Assistive device: Walker-rolling    Walk 150 feet activity   Assist Walk 150 feet activity did not occur: Safety/medical  concerns         Walk 10 feet on uneven surface  activity   Assist Walk 10 feet on uneven surfaces activity did not occur: Safety/medical concerns         Wheelchair     Assist Is the patient using a wheelchair?: Yes (for transport)      Wheelchair assist level: Dependent - Patient 0%      Wheelchair 50 feet with 2 turns activity    Assist        Assist Level: Dependent - Patient 0%   Wheelchair 150 feet activity     Assist      Assist Level: Dependent - Patient 0%   Blood pressure (!) 158/82, pulse 82, temperature 98.2 F (36.8 C), temperature source Oral, resp. rate 18, height 5\' 8"  (1.727 m), weight 114.1 kg, last menstrual period 03/30/2016, SpO2 94 %.  Medical Problem List and Plan: 1. 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  Antithrombotics: -DVT/anticoagulation:  Pharmaceutical: Lovenox 60mg  QD -antiplatelet therapy: Aspirin 81 mg daily and Plavix 75 mg daily x 3 months then Plavix alone 3. 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 Mood/Behavior/Sleep: Provide emotional support             -antipsychotic agents: N/A 5. Neuropsych/cognition: This patient is capable of making decisions on her own behalf. 6. Skin/Wound Care: Routine skin checks 7. Fluids/Electrolytes/Nutrition: Routine in and outs with follow-up  chemistries on 03/10/22 8.  Dysphagia.  Diet advanced to mechanical soft thin liquids.   -tolerating presently -advance per speech therapy 9.  Hyperlipidemia.  Lipitor 80mg  QD 10.  Diabetes mellitus.  Hemoglobin A1c 8.7.  Currently on SSI.  Prior to admission patient on DULAGLUTIDE 3 mg every Sunday and HUMALOG 50/50 80 units 3 times daily, JANUVIA 100 mg daily.  Resume as needed -03/08/22 CBGs in 210s-340s, spoke with pharmacy, recommended adding Semglee 30U QD (instead of home humalog), could consider using resistant SSI but  will leave on Moderate for now; monitor -03/09/22 CBGS down to 240-290s, today starting with qAM Semglee, so will hold off on Semglee adjustments for now, will add QHS SSI to help with improved control  CBG (last 3)  Recent Labs    03/09/22 1658 03/09/22 2102 03/10/22 0623  GLUCAP 287* 288* 255*    Increase Semglee to 35U  11.  Hypertension/SVT.  Lopressor 25 mg twice daily.  Monitor with increased mobility -03/08/22 Had tachycardic/hypertensive/diaphoretic episode with PT this morning, increased Lopressor to 27.5mg  BID and additional 25mg  dose once this morning, monitor -03/09/22 BPs and HR downtrending overnight, slightly up this morning, received 25mg  lopressor at 7:30am and 37.5mg  at 2131pm yesterday; monitor for now, if uptrending then could consider addt'l antihypertensive  Vitals:   03/07/22 1940 03/08/22 0425 03/08/22 0928 03/08/22 1126  BP: (!) 157/80 (!) 160/82 (!) 123/94 112/74   03/08/22 1401 03/08/22 1641 03/08/22 2122 03/09/22 0156  BP: 106/85 116/76 117/84 127/65   03/09/22 0840 03/09/22 1343 03/09/22 1922 03/10/22 0453  BP: (!) 155/93 134/70 (!) 144/81 (!) 158/82     12.  Obesity.  BMI 40.12.  Dietary follow-up 13.  CKD stage IIIB.  Latest creatinine 1.28.  Follow-up chemistries    Latest Ref Rng & Units 03/10/2022    5:45 AM 03/07/2022    6:04 AM 03/05/2022    6:40 AM  BMP  Glucose 70 - 99 mg/dL 230  230  188   BUN 6 - 20 mg/dL 16  12  9     Creatinine 0.44 - 1.00 mg/dL 1.14  1.42  1.28   Sodium 135 - 145 mmol/L 135  135  135   Potassium 3.5 - 5.1 mmol/L 3.7  4.2  4.2   Chloride 98 - 111 mmol/L 103  105  105   CO2 22 - 32 mmol/L 23  21  21    Calcium 8.9 - 10.3 mg/dL 8.6  8.8  8.9    Improved 1/29  14.  Constipation: -03/08/22 no BM in 2 days, ordered colace 100mg  BID, miralax 17g QD+PRN, monitor for effect -03/09/22 still no BM, pt resistant to added meds for now, advised that if no BM today, may need to increase regimen tomorrow; monitor   15.  Morbid obesity BMI 38 with DM 2 LOS: 3 days A FACE TO FACE EVALUATION WAS PERFORMED  Michelle Brooks 03/10/2022, 8:14 AM

## 2022-03-10 NOTE — Progress Notes (Signed)
[facility] Individual Statement of Services  Patient Name:  Michelle Brooks  Date:  03/10/2022  Welcome to the [facility].  Our goal is to provide you with an individualized program based on your diagnosis and situation, designed to meet your specific needs.  With this comprehensive rehabilitation program, you will be expected to participate in at least 3 hours of rehabilitation therapies Monday-Friday, with modified therapy programming on the weekends.  Your rehabilitation program will include the following services:  Physical Therapy (PT), Occupational Therapy (OT), Speech Therapy (ST), 24 hour per day rehabilitation nursing, Therapeutic Recreaction (TR), Neuropsychology, Care Coordinator, Rehabilitation Medicine, Nutrition Services, Pharmacy Services, and Other  Weekly team conferences will be held on Wednesdays to discuss your progress.  Your Inpatient Rehabilitation Care Coordinator will talk with you frequently to get your input and to update you on team discussions.  Team conferences with you and your family in attendance may also be held.  Expected length of stay:  10-12 Days  Overall anticipated outcome:  Supervision to MOD I  Depending on your progress and recovery, your program may change. Your Inpatient Rehabilitation Care Coordinator will coordinate services and will keep you informed of any changes. Your Inpatient Rehabilitation Care Coordinator's name and contact numbers are listed  below.  The following services may also be recommended but are not provided by the [facility]:   [place] will be made to provide these services after discharge if needed.  Arrangements include referral to agencies that provide these services.  Your insurance has been verified to be:   [place] Your primary doctor is:  Nolene Ebbs, MD  Pertinent information  will be shared with your doctor and your insurance company.  Inpatient Rehabilitation Care Coordinator:  Erlene Quan, [place] or [phone]  Information discussed with and copy given to patient by: Dyanne Iha, 03/10/2022, 10:06 AM

## 2022-03-10 NOTE — Progress Notes (Signed)
Speech Language Pathology Daily Session Note  Patient Details  Name: Michelle Brooks MRN: [id number] Date of Birth: 05/22/1965  Today's Date: 03/10/2022 SLP Individual Time: 1275-1700 SLP Individual Time Calculation (min): 55 min  Short Term Goals: Week 1: SLP Short Term Goal 1 (Week 1): Pt will tolerate dys 3 diet with use of compensatory strategies with minA verbal cues. SLP Short Term Goal 2 (Week 1): Pt will recall 3 memory strategies to assist with recall of important information with minA verbal cues. SLP Short Term Goal 3 (Week 1): Pt will demonstrate sustained attention to therapeutic task with occasional redirection. SLP Short Term Goal 4 (Week 1): Pt will demonstrate ability to manage medications by comprehending and completing medication management task with minA.  Skilled Therapeutic Interventions: Skilled ST treatment focused on cognitive and swallowing goals. Pt was greeted sitting EOB on arrival. Upon entry, pt stated "I'm ready to go home." Pt was feeling down and perseverative on the cause of her stroke. SLP provided stroke education but pt appeared non-accepting of this and continued to express how she is having to "start over" and was very upset by this throughout session. Pt exhibited poor frustration tolerance.   Pt was given coffee from her fiance. Pt did not initially perform L head turn and exhibited immediate cough response. With sup A verbal cues, pt was able to consistently implement L head turn with all subsequent swallows. Pt then required min A verbal cues and education regarding other swallowing strategies as per MBS results 03/06/22, including multiple swallows, and alternating between bites/sips. SLP used imaging from [facility] to reinforce education. Pt stated she was uninterested in addressing swallowing needs because "I never had to do this before" and further stated how she needed to understand what caused her stroke. Again, SLP providing education, explained rehab process  to support and treat functional deficits as result of recent CVA to help maximize pt's functional independence. SLP reinforced clinical reasoning for current diet and swallowing precautions/strategies.   SLP messaged PA to inquire about providing follow-up education about pt's stroke history per pt request. SLP attempted to initiate medication management task but given the above, did not have time to further pursue. Will address in upcoming session(s).   Patient was left in bed with alarm activated and immediate needs within reach at end of session. Continue per current plan of care.      Pain Pain Assessment Pain Scale: 0-10 Pain Score: 4  Pain Type: Acute pain Pain Orientation: Left Pain Descriptors / Indicators: Aching Pain Onset: On-going Pain Intervention(s): Medication (See eMAR) Multiple Pain Sites: No  Therapy/Group: Individual Therapy  Patty Sermons 03/10/2022, 8:23 AM

## 2022-03-10 NOTE — IPOC Note (Signed)
Overall Plan of Care [facility]) Patient Details Name: Michelle Brooks MRN: [id number] DOB: 1965-12-29  Admitting Diagnosis: Right pontine cerebrovascular accident [facility])  Hospital Problems: Principal Problem:   Right pontine cerebrovascular accident [facility])     Functional Problem List: Nursing Bladder, Safety, Endurance, Medication Management, Pain  PT Balance, Perception, Behavior, Safety, Edema, Sensory, Skin Integrity, Endurance, Motor, Nutrition, Pain  OT Balance, Motor, Sensory, Behavior, Cognition, Vision, Endurance, Safety  SLP Cognition  TR         Basic ADL's: OT Eating, Grooming, Bathing, Dressing, Toileting     Advanced  ADL's: OT Simple Meal Preparation     Transfers: PT Bed Mobility, Bed to Chair, Car, Furniture, Futures trader, Metallurgist: PT Ambulation, Stairs     Additional Impairments: OT Fuctional Use of Upper Extremity  SLP Swallowing, Social Cognition   Memory, Attention  TR      Anticipated Outcomes Item Anticipated Outcome  Self Feeding Mod I  Swallowing  sup   Basic self-care  Set up/supervision  Toileting  Mod I   Bathroom Transfers Mod I  Bowel/Bladder  manage bladder w toileting assistance  Transfers  supervision using LRAD  Locomotion  supervision using LRAD  Communication  sup  Cognition  sup  Pain  < 4 with prns  Safety/Judgment  manage w cues   Therapy Plan: PT Intensity: Minimum of 1-2 x/day ,45 to 90 minutes PT Frequency: 5 out of 7 days PT Duration Estimated Length of Stay: 2-2.5 weeks OT Intensity: Minimum of 1-2 x/day, 45 to 90 minutes OT Frequency: 5 out of 7 days OT Duration/Estimated Length of Stay: 14-16 days SLP Intensity: Minumum of 1-2 x/day, 30 to 90 minutes SLP Frequency: 3 to 5 out of 7 days SLP Duration/Estimated Length of Stay: 10-12 days   Team Interventions: Nursing Interventions Bladder Management, Pain Management, Patient/Family Education, Medication Management, Discharge Planning,  Disease Management/Prevention  PT interventions Ambulation/gait training, Community reintegration, DME/adaptive equipment instruction, Neuromuscular re-education, Psychosocial support, Stair training, UE/LE Strength taining/ROM, Wheelchair propulsion/positioning, Training and development officer, Discharge planning, Functional electrical stimulation, Pain management, Skin care/wound management, Therapeutic Activities, UE/LE Coordination activities, Cognitive remediation/compensation, Disease management/prevention, Functional mobility training, Patient/family education, Splinting/orthotics, Therapeutic Exercise, Visual/perceptual remediation/compensation  OT Interventions Balance/vestibular training, Disease mangement/prevention, Neuromuscular re-education, Functional electrical stimulation, Patient/family education, Self Care/advanced ADL retraining, Splinting/orthotics, Therapeutic Exercise, UE/LE Coordination activities, Cognitive remediation/compensation, Discharge planning, DME/adaptive equipment instruction, Functional mobility training, Pain management, Psychosocial support, Therapeutic Activities, UE/LE Strength taining/ROM, Visual/perceptual remediation/compensation  SLP Interventions Cognitive remediation/compensation, Environmental controls, Multimodal communication approach, Therapeutic Activities, Internal/external aids, Therapeutic Exercise, Dysphagia/aspiration precaution training, Patient/family education  TR Interventions    SW/CM Interventions Discharge Planning, Psychosocial Support, Patient/Family Education, Disease Management/Prevention   Barriers to Discharge MD  Medical stability  Nursing Home environment access/layout, Decreased caregiver support 1 level 15 ste left rail w s.o.; Dtr to assist  PT Inaccessible home environment, Home environment access/layout 15 STE apartment with only L HR  OT      SLP      SW Inaccessible home environment, Insurance for SNF coverage, Home  environment access/layout     Team Discharge Planning: Destination: PT-Home ,OT- Home , SLP-Home Projected Follow-up: PT-Outpatient PT, 24 hour supervision/assistance, OT-  Outpatient OT, SLP-Outpatient SLP Projected Equipment Needs: PT-To be determined, OT- To be determined, SLP-None recommended by SLP Equipment Details: PT- , OT-  Patient/family involved in discharge planning: PT- Patient,  OT-Patient, Family member/caregiver, SLP-Patient  MD ELOS: 14-16d Medical Rehab Prognosis:  Good Assessment:  The patient has been admitted for CIR therapies with the diagnosis of Right pontine infarct. The team will be addressing functional mobility, strength, stamina, balance, safety, adaptive techniques and equipment, self-care, bowel and bladder mgt, patient and caregiver education, uncontrolled diabetes. Goals have been set at Mod I/Sup. Anticipated discharge destination is Home.        See Team Conference Notes for weekly updates to the plan of care

## 2022-03-11 LAB — GLUCOSE, CAPILLARY
Glucose-Capillary: 292 mg/dL — ABNORMAL HIGH (ref 70–99)
Glucose-Capillary: 260 mg/dL — ABNORMAL HIGH (ref 70–99)
Glucose-Capillary: 319 mg/dL — ABNORMAL HIGH (ref 70–99)
Glucose-Capillary: 298 mg/dL — ABNORMAL HIGH (ref 70–99)

## 2022-03-11 MED ORDER — INSULIN GLARGINE-YFGN 100 UNIT/ML ~~LOC~~ SOLN
30.0000 [IU] | Freq: Two times a day (BID) | SUBCUTANEOUS | Status: DC
  Administered 2022-03-11: 30 [IU] via SUBCUTANEOUS
  Filled 2022-03-11 (×3): qty 0.3

## 2022-03-11 MED ORDER — METOPROLOL TARTRATE 50 MG PO TABS
50.0000 mg | ORAL_TABLET | Freq: Two times a day (BID) | ORAL | Status: DC
  Administered 2022-03-11 – 2022-03-15 (×8): 50 mg via ORAL
  Filled 2022-03-11 (×8): qty 1

## 2022-03-11 NOTE — Progress Notes (Signed)
Occupational Therapy Session Note  Patient Details  Name: Michelle Brooks MRN: [id number] Date of Birth: October 24, 1965  Today's Date: 03/11/2022 OT Individual Time: 1115-1200 OT Individual Time Calculation (min): 45 min  OT Individual Time: 6761-9509 OT Individual Time Calculation (min): 14 min  Missed Minutes: 31 min  Short Term Goals: Week 1:  OT Short Term Goal 1 (Week 1): Patient will demonstrate 30 degrees of active assisted right shoulder flexion in gravity eliminated position with no more than 2 point increase in pain OT Short Term Goal 2 (Week 1): Patient will complete toileting with contact guard assist OT Short Term Goal 3 (Week 1): Patienyt will complete tub shower transfer with min assist with use of tub trasnfer bench OT Short Term Goal 4 (Week 1): Patient will utilize Right arm motion to place into shirt sleeve with min cueing OT Short Term Goal 5 (Week 1): Patient will dress lower body (excluding TED hose) with intermittent  min assist  Skilled Therapeutic Interventions/Progress Updates:     AM Session:  Pt received sitting up in wc presenting to be in good spirits. Pt initially refusing session d/t desire to watch tv shows. Pt educated on purpose of therapy sessions for preparing Pt to d/c home and increase independence in BADLs/functional mobility with Pt more receptive to participation following education. Pt reporting un-rated pain in L shoulder- offered rest breaks, repositioning, gentle ROM, and heat pack to decrease pain.   Focused beginning of session on discussing home set-up and DME needs. Pt receptive to using shower chair upon d/c home. Education provided on shower safety with emphasis on environmental barriers and set-up to increase safety.   Pt transported to therapy gym total A in wc for time management and energy conservation.   Pt completed gentle BUE AAROM exercises in attempt to increase ROM and decrease pain. Pt completed 2x10 reps scapular  protraction/retraction  and partial shoulder flexion/extension in gravity eliminated plane with BUEs placed on large yoga ball with weight bearing facilitation. Pt then completed 2x10 reps of seated anterior shoulder flexion lifting large yoga ball from ~30 to ~75 degrees of ROM with min A provided under LUE. Pt presenting to be in creased pain following exercises with rest break provided.   Pt reported she enjoys riding bike at gym with education provided on NuStep similarities. Pt reporting interesting in trying NuStep. Pt completed 6 minutes of endurance and LB strengthening on NuStep at level 2 intensity for 6 minutes to address activity tolerance deficits and to increase moral for participation in therapy sessions. Pt initially utilizing BUEs, but discontinued BUE use d/t pain in L shoulder. Pt tolerated activity without SOB noted.   Pt transported back to room total A in wc. Pt was left resting in wc with call bell in reach, seat belt alarm on, and all needs met.   PM Session: Pt received sitting up in bed with Rn present in room finishing providing Pt education. Pt presenting to be agitated reporting she doesn't need therapy and that she is ready to go home. Pt presenting to be in 0/10 pain. Pt provided gentle therapeutic support and listening in attempt to improve moral, however Pt unreceptive of therapy session. Pt educated on purpose of IPR and importance of increasing her safety and independence prior to d/c home, however pt also unreceptive to education and repeatedly telling therapist "I will be all better once I get home; I was able to do all this on my own before I got her." OT  provided gentle education on impact of CVA in safety, functional mobility, and BADL status with Pt becoming increasingly upset. Pt not participating in session with 31 missed skilled OT treatment d/t Pt refusing to participate. Pt educated OT would be returning later in day to revisit therapy session. Pt provided maximal  therapeutic support, motivation, and listening with mild improvement in moral noted. Pt was left resting in bed with call bell in reach, bed alarm on, and all needs met.   13:15- Attempted to see Pt at this time to make-up missed minutes. Pt politely refusing and unreceptive of therapy session.   Therapy Documentation Precautions:  Precautions Precautions: Fall Precaution Comments: L UE pain, ataxia Restrictions Weight Bearing Restrictions: No General:   Vital Signs:  Pain: Pain Assessment Pain Scale: 0-10 Pain Score: 0-No pain ADL: ADL Eating: Minimal assistance Where Assessed-Eating: Chair Grooming: Minimal assistance Where Assessed-Grooming: Sitting at sink Upper Body Bathing: Minimal assistance Where Assessed-Upper Body Bathing: Shower Lower Body Bathing: Minimal assistance Where Assessed-Lower Body Bathing: Shower Upper Body Dressing: Minimal assistance Where Assessed-Upper Body Dressing: Sitting at sink Lower Body Dressing: Moderate assistance Where Assessed-Lower Body Dressing: Sitting at sink, Standing at sink Toileting: Minimal assistance Where Assessed-Toileting: Glass blower/designer: Psychiatric nurse Method: Counselling psychologist: Energy manager: Unable to assess Tub/Shower Transfer Method: Unable to assess [employer] Transfer: Minimal assistance Social research officer, government Method: Heritage manager: Radio broadcast assistant, Grab bars   Therapy/Group: Individual Therapy  Janey Genta 03/11/2022, 11:48 AM

## 2022-03-11 NOTE — Inpatient Diabetes Management (Signed)
Inpatient Diabetes Program Recommendations  AACE/ADA: New Consensus Statement on Inpatient Glycemic Control (2015)  Target Ranges:  Prepandial:   less than 140 mg/dL      Peak postprandial:   less than 180 mg/dL (1-2 hours)      Critically ill patients:  140 - 180 mg/dL   Lab Results  Component Value Date   GLUCAP 260 (H) 03/11/2022   HGBA1C 8.7 (H) 02/27/2022    Review of Glycemic Control  Latest Reference Range & Units 03/10/22 17:15 03/10/22 21:07 03/11/22 05:49 03/11/22 12:47  Glucose-Capillary 70 - 99 mg/dL 259 (H) 291 (H) 292 (H) 260 (H)  (H): Data is abnormally high Diabetes history: Type 2 DM Outpatient Diabetes medications: Humalog 50/50 80 units TID Current orders for Inpatient glycemic control: Semglee 35 units QD, Novolog 0-15 units TID & HS  Inpatient Diabetes Program Recommendations:    Consider increasing Semglee 30 units BID. Discussed with Pam, PA.   Spoke with patient regarding outpatient diabetes management. Patient is frustrated because of her poor inpatient glucose control; feels that she could do better with Humalog 50/50. Verified home medications.  Reviewed patient's current A1c of 8.7%. Explained what a A1c is and what it measures. Also reviewed goal A1c with patient, importance of good glucose control @ home, and blood sugar goals. Reviewed patho of DM, target goals, vascular changes and commorbidities.  Patient has meter and testing supplies.  Encouraged to have family member bring home medications and we could consider switching to see trends prior to discharge. Reviewed plan to include increase of Semglee with patient and is in agreement.  No additional questions at this time.  Thanks, Bronson Curb, MSN, RNC-OB Diabetes Coordinator [phone] (8a-5p)

## 2022-03-11 NOTE — Progress Notes (Addendum)
PROGRESS NOTE   Subjective/Complaints:  DIscussed diabetic management , refused hospital regimen, insist on home humalog 50/50, home dose 80U TID   ROS: +L shoulder pain-no numbness or swelling    Objective:   No results found. Recent Labs    03/10/22 0545  WBC 8.4  HGB 13.4  HCT 39.5  PLT 301    Recent Labs    03/10/22 0545  NA 135  K 3.7  CL 103  CO2 23  GLUCOSE 230*  BUN 16  CREATININE 1.14*  CALCIUM 8.6*     Intake/Output Summary (Last 24 hours) at 03/11/2022 0809 Last data filed at 03/10/2022 1845 Gross per 24 hour  Intake 1909 ml  Output --  Net 1909 ml         Physical Exam: Vital Signs Blood pressure (!) 178/89, pulse 81, temperature 98 F (36.7 C), temperature source Oral, resp. rate 18, height 5\' 8"  (1.727 m), weight 114.1 kg, last menstrual period 03/30/2016, SpO2 99 %.  Physical Exam  General: No acute distress Mood and affect are appropriate Heart: Regular rate and rhythm no rubs murmurs or extra sounds Lungs: Clear to auscultation, breathing unlabored, no rales or wheezes Abdomen: Positive bowel sounds, soft nontender to palpation, nondistended Extremities: No clubbing, cyanosis, or edema     Patient is alert.  Has eye contact with examiner.  Speech is mildly dysarthric but intelligible.  Provides name and age.  Follows simple commands.  Psychiatric:     poor eye contact, irritable   Assessment/Plan: 1. Functional deficits which require 3+ hours per day of interdisciplinary therapy in a comprehensive inpatient rehab setting. Physiatrist is providing close team supervision and 24 hour management of active medical problems listed below. Physiatrist and rehab team continue to assess barriers to discharge/monitor patient progress toward functional and medical goals  Care Tool:  Bathing    Body parts bathed by patient: Chest, Abdomen, Front perineal area, Right upper leg, Left  upper leg, Face, Buttocks   Body parts bathed by helper: Buttocks, Left lower leg, Right lower leg     Bathing assist Assist Level: Moderate Assistance - Patient 50 - 74%     Upper Body Dressing/Undressing Upper body dressing   What is the patient wearing?: Pull over shirt    Upper body assist Assist Level: Minimal Assistance - Patient > 75%    Lower Body Dressing/Undressing Lower body dressing      What is the patient wearing?: Underwear/pull up, Pants     Lower body assist Assist for lower body dressing: Moderate Assistance - Patient 50 - 74%     Toileting Toileting    Toileting assist Assist for toileting: Minimal Assistance - Patient > 75%     Transfers Chair/bed transfer  Transfers assist     Chair/bed transfer assist level: Minimal Assistance - Patient > 75%     Locomotion Ambulation   Ambulation assist      Assist level: Minimal Assistance - Patient > 75% (required use of RW otherwise pt did not feel safe to ambulate (did not use AD baseline)) Assistive device: Walker-rolling Max distance: 9ft   Walk 10 feet activity   Assist  Assist level: Minimal Assistance - Patient > 75% Assistive device: Walker-rolling   Walk 50 feet activity   Assist    Assist level: Minimal Assistance - Patient > 75% Assistive device: Walker-rolling    Walk 150 feet activity   Assist Walk 150 feet activity did not occur: Safety/medical concerns         Walk 10 feet on uneven surface  activity   Assist Walk 10 feet on uneven surfaces activity did not occur: Safety/medical concerns         Wheelchair     Assist Is the patient using a wheelchair?: Yes (for transport) Type of Wheelchair: Manual    Wheelchair assist level: Dependent - Patient 0%      Wheelchair 50 feet with 2 turns activity    Assist        Assist Level: Dependent - Patient 0%   Wheelchair 150 feet activity     Assist      Assist Level: Dependent -  Patient 0%   Blood pressure (!) 178/89, pulse 81, temperature 98 F (36.7 C), temperature source Oral, resp. rate 18, height 5\' 8"  (1.727 m), weight 114.1 kg, last menstrual period 03/30/2016, SpO2 99 %.  Medical Problem List and Plan: 1. 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  Antithrombotics: -DVT/anticoagulation:  Pharmaceutical: Lovenox 60mg  QD -antiplatelet therapy: Aspirin 81 mg daily and Plavix 75 mg daily x 3 months then Plavix alone 3. 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 Mood/Behavior/Sleep: Provide emotional support             -antipsychotic agents: N/A 5. Neuropsych/cognition: This patient is capable of making decisions on her own behalf. 6. Skin/Wound Care: Routine skin checks 7. Fluids/Electrolytes/Nutrition: Routine in and outs with follow-up chemistries on 03/10/22 8.  Dysphagia.  Diet advanced to mechanical soft thin liquids.   -tolerating presently -advance per speech therapy 9.  Hyperlipidemia.  Lipitor 80mg  QD 10.  Diabetes mellitus.  Hemoglobin A1c 8.7.  Currently on SSI.  Prior to admission patient on DULAGLUTIDE 3 mg every "Sunday and HUMALOG 50/50 80 units 3 times daily, JANUVIA 100 mg daily.  has home humalog brought in by fiancee' currently at bedside , RN to administer.  Discussed that will initiate at lower dose and titrate up  with DM coordinator assist  Start Humalog 50/50 at 40 U TID with meals   CBG (last 3)  Recent Labs    03/10/22 1715 03/10/22 2107 03/11/22 0549  GLUCAP 259* 291* 292*     11" .  Hypertension/SVT.  Lopressor 25 mg twice daily.  Monitor with increased mobility -03/08/22 Had tachycardic/hypertensive/diaphoretic episode with PT this morning, increased Lopressor to 37.5mg  BID and additional 25mg  dose once this morning, monitor -may increase metoprolol to 50mg  BID  Vitals:   03/08/22 1126 03/08/22 1401 03/08/22 1641 03/08/22 2122  BP: 112/74 106/85  116/76 117/84   03/09/22 0156 03/09/22 0840 03/09/22 1343 03/09/22 1922  BP: 127/65 (!) 155/93 134/70 (!) 144/81   03/10/22 0453 03/10/22 1348 03/10/22 1921 03/11/22 0259  BP: (!) 158/82 135/78 (!) 144/87 (!) 178/89     12.  Obesity.  BMI 40.12.  Dietary follow-up 13.  CKD stage IIIB.  Latest creatinine 1.28.  Follow-up chemistries    Latest Ref Rng & Units 03/10/2022    5:45 AM 03/07/2022    6:04 AM 03/05/2022    6:40 AM  BMP  Glucose 70 - 99 mg/dL 623  762  831   BUN 6 - 20 mg/dL 16  12  9    Creatinine 0.44 - 1.00 mg/dL 5.17  6.16  0.73   Sodium 135 - 145 mmol/L 135  135  135   Potassium 3.5 - 5.1 mmol/L 3.7  4.2  4.2   Chloride 98 - 111 mmol/L 103  105  105   CO2 22 - 32 mmol/L 23  21  21    Calcium 8.9 - 10.3 mg/dL 8.6  8.8  8.9    Monitor   14.  Constipation: -03/08/22 no BM in 2 days, ordered colace 100mg  BID, miralax 17g QD+PRN, monitor for effect -03/09/22 still no BM, pt resistant to added meds for now, advised that if no BM today, may need to increase regimen tomorrow; monitor   15.  Morbid obesity BMI 38 with DM 2 LOS: 4 days A FACE TO FACE EVALUATION WAS PERFORMED  Erick Colace 03/11/2022, 8:09 AM

## 2022-03-11 NOTE — Progress Notes (Addendum)
Patient ID: Michelle Brooks, female   DOB: 12/04/1965, 57 y.o.   MRN: [id number]  SW requested daughter to email blank copy of FMLA paperwork due to medical provider section being filled.   1:54 PM:  Sw received call from pt's daughter requesting a [place] aide and informing SW FMLA paperwork was sent over. No additional questions or concerns.

## 2022-03-11 NOTE — Progress Notes (Signed)
Physical Therapy Session Note  Patient Details  Name: Michelle Brooks MRN: [id number] Date of Birth: 1965/12/05  Today's Date: 03/11/2022 PT Individual Time: 0903-1003 and 0017-4944 PT Individual Time Calculation (min): 60 min and 16 min   and  Today's Date: 03/11/2022 PT Missed Time: 44 Minutes Missed Time Reason: MD hold (Comment) (PA hold due to elevated HR)  Short Term Goals: Week 1:  PT Short Term Goal 1 (Week 1): Pt will perform supine<>sit using adjustable base bed features with CGA PT Short Term Goal 2 (Week 1): Pt will perform bed<>chair transfers using LRAD with CGA PT Short Term Goal 3 (Week 1): Pt will ambulate at least 176ft using LRAD with CGA PT Short Term Goal 4 (Week 1): Pt will navigate at least 8 steps using HR with CGA PT Short Term Goal 5 (Week 1): Pt will participate in standardized outcome measure to assess her fall risk  Skilled Therapeutic Interventions/Progress Updates:    Session 1: Pt received sitting EOB talking on the phone with her SO. Pt agreeable to therapy session with encouragement as pt continuously states during session "I'm ready to go home" with pt expressing she doesn't want anyone to think she is "vulnerable" by having them see her use a walker to walk. Pt states the doctor told her the reason she is having recurrent strokes is due to having "small vessels" and pt states "what is the point of doing this...of eating healthy or exercising if I can't prevent another stroke." Therapist provided emotional support and education on the increased importance of those things and ensuring she is in control of her modifiable risk factors knowing she cannot change her vessel size but pt remains "down" (as she calls it). Therapist provides further emotional support throughout session.  L stand pivot EOB>w/c relying heavily on B UE support on w/c armrests for steadying/balance with light min assist.  Transported to/from gym in w/c for time management and energy  conservation.   Gait training 153ft with +2 min assist for balance via B HHA (cautious with L UE support due to shoulder pain/hyp)  - pt starts with very robotic gait pattern, taking 1 step at a time and pausing in double limb support to regain balance - incoordination in L LE taking excessively large step - R LE landing on toes during initial contact to support her balance  - progressed to more continuous, fluid forward stepping with decreased double limb support time and improved heel strike bilaterally with cuing to take slightly smaller steps to improve her stability   HR 105bpm decreased to 76bpm in <14minute  Pt reports urgent need to use bathroom - transported back to room.  Gait training in/out bathroom using RW with CGA for steadying/safety. Standing with CGA performed LB clothing body management without assist. Pt continent of bladder and very small BM - reports she feels urge to have BM but states she feels like she can't go fully - educated on ensuring adequate fluid intake and pt states she only drinks bottled water (placed sign in pt's room). Performed peri-care without assist. Standing at sink performed hand hygiene with education on proper AD placement at sink.  Transported back to gym.  Stair navigation training ascending/descending 12 steps (6" height) using L HR only to simulate home environment with CGA - performed side-step technique to allow B UE support on HR and step-to pattern leading with R LE on ascent and L LE on descent.   Transported back to her room and pt  agreeable to remain sitting up in w/c and reports gratitude stating this session helped uplift her mood. Pt left with needs in reach and seat belt alarm on.   Session 2: Pt received resting, supine in bed reporting she has had an elevated HR this afternoon. Assessed HR to be fluctuating 120-134bpm at rest with pt denying any symptoms and stating she feels completely normal. SpO2 98% on RA. Notified RN and then  consulted PA, who recommended to hold therapy at this time and reassess in the morning. Therapist educated pt on this and pt's HR still at 127bpm resting in bed, denying symptoms. Pt left resting in bed with needs in reach. Missed 44 minutes of skilled physical therapy.   Therapy Documentation Precautions:  Precautions Precautions: Fall Precaution Comments: L UE pain, ataxia Restrictions Weight Bearing Restrictions: No   Pain:  Session 1: No reports of pain throughout session.  Session 2: No reports of pain throughout session.   Therapy/Group: Individual Therapy  Tawana Scale , PT, DPT, NCS, CSRS 03/11/2022, 7:49 AM

## 2022-03-12 LAB — GLUCOSE, CAPILLARY
Glucose-Capillary: 431 mg/dL — ABNORMAL HIGH (ref 70–99)
Glucose-Capillary: 270 mg/dL — ABNORMAL HIGH (ref 70–99)
Glucose-Capillary: 188 mg/dL — ABNORMAL HIGH (ref 70–99)
Glucose-Capillary: 80 mg/dL (ref 70–99)
Glucose-Capillary: 166 mg/dL — ABNORMAL HIGH (ref 70–99)
Glucose-Capillary: 177 mg/dL — ABNORMAL HIGH (ref 70–99)

## 2022-03-12 MED ORDER — INSULIN LISPRO PROT & LISPRO (50-50 MIX) 100 UNIT/ML ~~LOC~~ SUSP
40.0000 [IU] | Freq: Three times a day (TID) | SUBCUTANEOUS | Status: DC
  Administered 2022-03-12 – 2022-03-14 (×5): 40 [IU] via SUBCUTANEOUS

## 2022-03-12 MED ORDER — INSULIN LISPRO 100 UNIT/ML IJ SOLN: 40.0000 [IU] | Freq: Three times a day (TID) | INTRAMUSCULAR | Status: DC

## 2022-03-12 NOTE — Discharge Summary (Signed)
Physician Discharge Summary  Patient ID: Michelle Brooks Klann MRN: [id number] DOB/AGE: 57/04/1965 57 y.o.  Admit date: 03/07/2022 Discharge date: 03/15/2022  Discharge Diagnoses:  Principal Problem:   Right pontine cerebrovascular accident [facility](HCC) DVT prophylaxis Severe left upper extremity shoulder pain likely hyperalgesic related to left lateral medullary infarction Hyperlipidemia Diabetes mellitus Hypertension/SVT Obesity CKD stage III Constipation  Discharged Condition: Stable  Significant Diagnostic Studies: DG Shoulder Left  Result Date: 03/07/2022 CLINICAL DATA:  Acute left shoulder pain. EXAM: LEFT SHOULDER - 2+ VIEW COMPARISON:  None Available. FINDINGS: There is no evidence of fracture or dislocation. There is no evidence of arthropathy or other focal bone abnormality. Calcifications are seen projected over greater tuberosity suggesting calcific tendinosis. Soft tissues are unremarkable. IMPRESSION: Calcifications seen projected over greater tuberosity of proximal left humerus suggesting calcific tendinosis. No fracture or dislocation is noted. Electronically Signed   By: Lupita RaiderJames  Green Jr M.Brooks.   On: 03/07/2022 16:43   DG Swallowing Func-Speech Pathology  Result Date: 03/06/2022 Table formatting from the original result was not included. Objective Swallowing Evaluation: Type of Study: MBS-Modified Barium Swallow Study  Patient Details Name: Michelle Brooks Capron MRN: [id number] Date of Birth: 02/13/1965 Today's Date: 03/06/2022 Time: SLP Start Time (ACUTE ONLY): 0915 -SLP Stop Time (ACUTE ONLY): 0930 SLP Time Calculation (min) (ACUTE ONLY): 15 min Past Medical History: Past Medical History: Diagnosis Date  Chronic back pain   Diabetes mellitus type 2 in obese (HCC)   Diabetic retinopathy (HCC)   GERD (gastroesophageal reflux disease)   Headache   History of stroke 03/2010  Right MCA stroke in 03/2010, with MRI also noting subacute strokes in left fronto parietal area [place] [facility] to uncontrolled blood pressure // No residual deficits // [facility] - normal LV systolic function, moderate pericardial effusion with 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  HSV-2 infection   Hyperlipidemia LDL goal < 100 04/01/2011  Hypertension   Lumbago   pain in the back Past Surgical History: Past Surgical History: Procedure Laterality Date  BREAST LUMPECTOMY  2002  states benign lymph node removed.  BUBBLE STUDY  08/30/2018  Procedure: BUBBLE STUDY;  Surgeon: Vesta MixerNahser, Philip J, MD;  [facility];  Service: Cardiovascular;;  ENDOMETRIAL BIOPSY  10/04/2020  LOOP RECORDER INSERTION N/A 08/30/2018  Procedure: LOOP RECORDER INSERTION;  Surgeon: Marinus Mawaylor, Gregg W, MD;  Location: [facility] INVASIVE CV LAB;  Service: Cardiovascular;  Laterality: N/A;  TEE WITHOUT CARDIOVERSION N/A 08/30/2018  Procedure: TRANSESOPHAGEAL ECHOCARDIOGRAM (TEE);  Surgeon: Elease HashimotoNahser, Deloris PingPhilip J, MD;  [facility];  Service: Cardiovascular;  Laterality: N/A; HPI: Michelle Brooks Laroque is a 57 y.o. female with medical history significant of chronic back pain, DM, HTN, prior CVA, and HLD presenting with emesis.  She has a h/o prior CVAs and these have historically presented with n/v.  She ate a lot of shrimp (no known allergy) and awoke yesterday AM with n/v.  She also noticed some dysphagia, dysarthria, tongue swelling, and eventual left-sided weakness (facial droop as well as LUE/LLE).   Her symptoms have progressed some while in the ER.  The patient appears to be more concerned about possible allergic reaction as the cause for her difficulty swallowing. AFter initial assessment MRI showed multiple acute infarcts involving left lateral medulla, right pontomedullary junction and left cerebellum.  Subjective: "I can't swallow"  Recommendations for follow up therapy are one component of a multi-disciplinary discharge  planning process, led by the  attending physician.  Recommendations may be updated based on patient status, additional functional criteria and insurance authorization. Assessment / Plan / Recommendation   03/06/2022  10:16 AM Clinical Impressions Clinical Impression Pt's oropharyngeal esophageal swallow function has improved from prior MBS. Most concerning impairment is her decreased pharyngoesophageal function although it has signifcantly improved allowing increase in bolus volume to transit through the PES with each swallow. In addition she is able to produce multiple  dry swallows today which she previously had been unable to do. After initial swallows there residue ranges from moderate-max increasing with thicker viscosities. Strategies to reduce residue were effective including multiple swallows, swallows with thin barium and most significant was a head turn to the left which signifcantly reduced pyriform sinus residue. There was only one flash penetration with initial teaspoon sip of thin and protection and initiation of swallow was adequate. Her tongue base retraction, pharyngeal wall contraction in addition to decreased PES distention and duration contributed to vallecular and pyriform sinus residue. Recommend texture upgrade to Dys 3, continue thin with LEFT HEAD TURN with solids, multiple swallows and alternate liquids and solids and crush meds. 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 Impact on safety and function Mild aspiration risk     03/06/2022  10:16 AM Treatment Recommendations Treatment Recommendations Therapy as outlined in treatment plan below     03/06/2022  10:16 AM Prognosis Prognosis for Safe Diet Advancement Good Barriers to Reach Goals Severity of deficits   03/06/2022  10:16 AM Diet Recommendations SLP Diet Recommendations Dysphagia 3 (Mech soft) solids;Thin liquid Liquid Administration via Straw;Cup Medication Administration Crushed  with puree Compensations Slow rate;Small sips/bites;Follow solids with liquid;Multiple dry swallows after each bite/sip Postural Changes Seated upright at 90 degrees     03/06/2022  10:16 AM Other Recommendations Oral Care Recommendations Oral care BID Follow Up Recommendations Acute inpatient rehab (3hours/day) Functional Status Assessment Patient has had a recent decline in their functional status and demonstrates the ability to make significant improvements in function in a reasonable and predictable amount of time.   03/06/2022  10:16 AM Frequency and Duration  Speech Therapy Frequency (ACUTE ONLY) min 2x/week Treatment Duration 2 weeks     03/06/2022  10:16 AM Oral Phase Oral Phase [facility]    03/06/2022  10:16 AM Pharyngeal Phase Pharyngeal Phase Impaired Pharyngeal- Nectar Teaspoon Pharyngeal residue - valleculae;Pharyngeal residue - pyriform;Pharyngeal residue - cp segment;Reduced pharyngeal peristalsis;Reduced tongue base retraction Pharyngeal- Nectar Cup NT Pharyngeal- Thin Teaspoon Penetration/Aspiration during swallow;Pharyngeal residue - pyriform;Reduced pharyngeal peristalsis;Reduced tongue base retraction;Pharyngeal residue - cp segment;Pharyngeal residue - valleculae Pharyngeal Material enters airway, remains ABOVE vocal cords then ejected out Pharyngeal- Thin Cup Pharyngeal residue - cp segment;Pharyngeal residue - valleculae;Pharyngeal residue - pyriform;Reduced pharyngeal peristalsis;Reduced tongue base retraction Pharyngeal- Thin Straw Reduced pharyngeal peristalsis;Reduced tongue base retraction;Pharyngeal residue - pyriform;Pharyngeal residue - valleculae;Pharyngeal residue - cp segment Pharyngeal- Puree Reduced pharyngeal peristalsis;Reduced airway/laryngeal closure;Pharyngeal residue - pyriform;Pharyngeal residue - valleculae;Pharyngeal residue - cp segment Pharyngeal- Regular Pharyngeal residue - cp segment;Pharyngeal residue - valleculae;Pharyngeal residue - pyriform;Reduced pharyngeal  peristalsis;Reduced tongue base retraction    03/06/2022  10:16 AM Cervical Esophageal Phase  Cervical Esophageal Phase -- Cervical Esophageal Comment -- [place] Siren 03/06/2022, 10:37 AM                     DG Swallowing Func-Speech Pathology  Result Date: 02/28/2022 Table formatting from the original result was not  included. Objective Swallowing Evaluation: Type of Study: MBS-Modified Barium Swallow Study  Patient Details Name: Michelle Brooks MRN: [id number] Date of Birth: 16-Nov-1965 Today's Date: 02/28/2022 Time: SLP Start Time (ACUTE ONLY): 1410 -SLP Stop Time (ACUTE ONLY): 1430 SLP Time Calculation (min) (ACUTE ONLY): 20 min Past Medical History: Past Medical History: Diagnosis Date  Chronic back pain   Diabetes mellitus type 2 in obese (HCC)   Diabetic retinopathy (HCC)   GERD (gastroesophageal reflux disease)   Headache   History of stroke 03/2010  Right MCA stroke in 03/2010, with MRI also noting subacute strokes in left fronto parietal area - occ[place] in [place] received care at [facility] thought due to uncontrolled blood pressure // No residual deficits // [facility]TE (03/2010) [facility] - normal LV systolic function, moderate pericardial effusion with 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  HSV-2 infection   Hyperlipidemia LDL goal < 100 04/01/2011  Hypertension   Lumbago   pain in the back Past Surgical History: Past Surgical History: Procedure Laterality Date  BREAST LUMPECTOMY  2002  states benign lymph node removed.  BUBBLE STUDY  08/30/2018  Procedure: BUBBLE STUDY;  Surgeon: Vesta Mixer, MD;  Location: [facility] ENDOSCOPY;  Service: Cardiovascular;;  ENDOMETRIAL BIOPSY  10/04/2020  LOOP RECORDER INSERTION N/A 08/30/2018  Procedure: LOOP RECORDER INSERTION;  Surgeon: Marinus Maw, MD;  Location: [facility] INVASIVE CV LAB;  Service: Cardiovascular;  Laterality: N/A;  TEE WITHOUT CARDIOVERSION N/A 08/30/2018  Procedure: TRANSESOPHAGEAL ECHOCARDIOGRAM (TEE);   Surgeon: Elease Hashimoto Deloris Ping, MD;  Location[facility] ENDOSCOPY;  Service: Cardiovascular;  Laterality: N/A; HPI: WHITNEY HILLEGASS is a 57 y.o. female with medical history significant of chronic back pain, DM, HTN, prior CVA, and HLD presenting with emesis.  She has a h/o prior CVAs and these have historically presented with n/v.  She ate a lot of shrimp (no known allergy) and awoke yesterday AM with n/v.  She also noticed some dysphagia, dysarthria, tongue swelling, and eventual left-sided weakness (facial droop as well as LUE/LLE).   Her symptoms have progressed some while in the ER.  The patient appears to be more concerned about possible allergic reaction as the cause for her difficulty swallowing. AFter initial assessment MRI showed multiple acute infarcts involving left lateral medulla, right pontomedullary junction and left cerebellum.  Subjective: "I can't swallow"  Recommendations for follow up therapy are one component of a multi-disciplinary discharge planning process, led by the attending physician.  Recommendations may be updated based on patient status, additional functional criteria and insurance authorization. Assessment / Plan / Recommendation   02/28/2022   2:00 PM Clinical Impressions Clinical Impression Pt presents with reduced anterior laryngeal mobility, epiglottic deflection and UES relaxation/opening. Pt requires large consecutive boluses with high level of sensory feedback and increased pharyngeal pressure for UES opening sufficient for meaningful intake; pt can swallow about 2 oz at a time. Otherwise, with single sips only the majority of the bolus remains above UES and pools in pyriforms and is easily expectorated by pt. With larger thin boluses pt still has reduced opening at tail of bolus with almost nasal regurgitation and  about 10% of bolus residing in pyriforms that is expectorated. Pt does not aspirate. She does have excessive mucous production during efforts. Pt cannot initaite a dry swallow  to clear residue. Purees and thicker liquids are not tolerated given poor UES opening and severe residue. Attempted head turn left - same as neutral; head turn  right and chin tuck worsened function. Recommend pt initiate full liquids BUT NO GRITS APPLESAUSE PUDDING OR JELLO yet. No meds crushed in puree, liquid only. Pt to begin pharyngeal exercises as soon as possible. Hopeful for rapid recovery. 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     02/28/2022   2:00 PM Treatment Recommendations Treatment Recommendations Therapy as outlined in treatment plan below     02/27/2022   2:00 PM Prognosis Prognosis for Safe Diet Advancement Fair Barriers to Reach Goals Other (Comment);Severity of deficits   02/28/2022   2:00 PM Diet Recommendations SLP Diet Recommendations Thin liquid Liquid Administration via Cup;Straw Medication Administration Via alternative means     02/28/2022   2:00 PM Other Recommendations Follow Up Recommendations Acute inpatient rehab (3hours/day)   02/28/2022   2:00 PM Frequency and Duration  Speech Therapy Frequency (ACUTE ONLY) min 2x/week Treatment Duration 2 weeks     02/28/2022   2:00 PM Oral Phase Oral Phase [facility]    02/28/2022   2:00 PM Pharyngeal Phase Pharyngeal Phase Impaired Pharyngeal- Nectar Teaspoon Reduced anterior laryngeal mobility;Pharyngeal residue - cp segment;Pharyngeal residue - pyriform;Reduced epiglottic inversion Pharyngeal- Nectar Cup Reduced anterior laryngeal mobility;Pharyngeal residue - cp segment;Pharyngeal residue - pyriform;Reduced epiglottic inversion Pharyngeal- Thin Teaspoon Reduced anterior laryngeal mobility;Pharyngeal residue - cp segment;Pharyngeal residue - pyriform;Reduced epiglottic inversion Pharyngeal- Thin Cup Reduced anterior laryngeal mobility;Pharyngeal residue - cp  segment;Pharyngeal residue - pyriform;Reduced epiglottic inversion Pharyngeal- Thin Straw Reduced anterior laryngeal mobility;Pharyngeal residue - cp segment;Pharyngeal residue - pyriform;Reduced epiglottic inversion Pharyngeal- Puree Reduced anterior laryngeal mobility;Pharyngeal residue - cp segment;Reduced epiglottic inversion    02/28/2022   2:00 PM Cervical Esophageal Phase  Cervical Esophageal Phase Impaired Nectar Teaspoon Reduced cricopharyngeal relaxation Nectar Cup Reduced cricopharyngeal relaxation Nectar Straw Reduced cricopharyngeal relaxation Thin Teaspoon Reduced cricopharyngeal relaxation Thin Cup Reduced cricopharyngeal relaxation Thin Straw Reduced cricopharyngeal relaxation Puree Reduced cricopharyngeal relaxation DeBlois, Riley Nearing 02/28/2022, 2:49 PM                     ECHOCARDIOGRAM COMPLETE BUBBLE STUDY  Result Date: 02/27/2022    ECHOCARDIOGRAM REPORT   Patient Name:   SHANTELLE ALLES Date of Exam: 02/27/2022 Medical Rec #:  [id number]      Height:       68.0 in Accession #:    [id number]     Weight:       250.0 lb Date of Birth:  11-Dec-1965       BSA:          2.247 m Patient Age:    56 years       BP:           184/86 mmHg Patient Gender: F              HR:           80 bpm. Exam Location:  Inpatient Procedure: 2D Echo, Color Doppler, Cardiac Doppler and Saline Contrast Bubble            Study Indications:    Stroke i63.9;  History:        Patient has prior history of Echocardiogram examinations, most                 recent 08/30/2018. Risk Factors:Hypertension, Diabetes and                 Dyslipidemia.  Sonographer:  [facility]om[facility] HospitalEmily Senior RDCS Referring Phys: [id number] [facility] PavilionALMAN KHALIQDINA IMPRESSIONS  1. Left ventricular ejection fraction, by estimation, is 60 to 65%. The left ventricle has normal function. The left ventricle has no regional wall motion abnormalities. There is mild concentric left ventricular hypertrophy. Left ventricular diastolic parameters are consistent with Grade I  diastolic dysfunction (impaired relaxation).  2. Right ventricular systolic function is normal. The right ventricular size is normal. Tricuspid regurgitation signal is inadequate for assessing PA pressure.  3. No evidence of mitral valve regurgitation.  4. The aortic valve is tricuspid. Aortic valve regurgitation is not visualized.  5. The inferior vena cava is normal in size with greater than 50% respiratory variability, suggesting right atrial pressure of 3 mmHg.  6. Agitated saline contrast bubble study was negative, with no evidence of any interatrial shunt. Comparison(s): No significant change from prior study. Conclusion(s)/Recommendation(s): Prior TEE in 2020 showed possible very small PFO or AVM. No significant PFO seen on TTE. With recurrent stroke, recommend repeat TEE with more bicaval views (3D imaging) to determine if has PFO amenable for closure. Can also r/o veg related emboli. ILR did not show afib. FINDINGS  Left Ventricle: Left ventricular ejection fraction, by estimation, is 60 to 65%. The left ventricle has normal function. The left ventricle has no regional wall motion abnormalities. The left ventricular internal cavity size was normal in size. There is  mild concentric left ventricular hypertrophy. Left ventricular diastolic parameters are consistent with Grade I diastolic dysfunction (impaired relaxation). Right Ventricle: The right ventricular size is normal. Right ventricular systolic function is normal. Tricuspid regurgitation signal is inadequate for assessing PA pressure. Left Atrium: Left atrial size was normal in size. Right Atrium: Right atrial size was normal in size. Pericardium: There is no evidence of pericardial effusion. Mitral Valve: No evidence of mitral valve regurgitation. Tricuspid Valve: Tricuspid valve regurgitation is not demonstrated. Aortic Valve: The aortic valve is tricuspid. Aortic valve regurgitation is not visualized. Pulmonic Valve: Pulmonic valve regurgitation is  not visualized. Aorta: The aortic root and ascending aorta are structurally normal, with no evidence of dilitation. Venous: The inferior vena cava is normal in size with greater than 50% respiratory variability, suggesting right atrial pressure of 3 mmHg. IAS/Shunts: Agitated saline contrast was given intravenously to evaluate for intracardiac shunting. Agitated saline contrast bubble study was negative, with no evidence of any interatrial shunt.  LEFT VENTRICLE PLAX 2D LVIDd:         4.10 cm   Diastology LVIDs:         2.90 cm   LV e' medial:    6.64 cm/s LV PW:         1.70 cm   LV E/e' medial:  12.1 LV IVS:        1.10 cm   LV e' lateral:   8.38 cm/s LVOT diam:     2.20 cm   LV E/e' lateral: 9.6 LV SV:         68 LV SV Index:   30 LVOT Area:     3.80 cm  RIGHT VENTRICLE RV S prime:     14.80 cm/s TAPSE (M-mode): 2.2 cm LEFT ATRIUM             Index        RIGHT ATRIUM           Index LA diam:        3.30 cm 1.47 cm/m   RA Area:     13.90 cm LA Vol (  A2C):   46.1 ml 20.52 ml/m  RA Volume:   30.90 ml  13.75 ml/m LA Vol (A4C):   52.2 ml 23.23 ml/m LA Biplane Vol: 49.7 ml 22.12 ml/m  AORTIC VALVE LVOT Vmax:   93.30 cm/s LVOT Vmean:  57.100 cm/s LVOT VTI:    0.178 m  AORTA Ao Root diam: 3.50 cm Ao Asc diam:  3.40 cm MITRAL VALVE MV Area (PHT): 2.81 cm     SHUNTS MV Decel Time: 270 msec     Systemic VTI:  0.18 m MV E velocity: 80.10 cm/s   Systemic Diam: 2.20 cm MV A velocity: 109.00 cm/s MV E/A ratio:  0.73 PhotographerMary Branch Electronically signed by Carolan ClinesMary Branch Signature Date/Time: 02/27/2022/11:44:43 AM    Final    CT ANGIO HEAD NECK W WO CM  Result Date: 02/27/2022 CLINICAL DATA:  Neuro deficit, acute, stroke suspected EXAM: CT ANGIOGRAPHY HEAD AND NECK TECHNIQUE: Multidetector CT imaging of the head and neck was performed using the standard protocol during bolus administration of intravenous contrast. Multiplanar CT image reconstructions and MIPs were obtained to evaluate the vascular anatomy. Carotid stenosis  measurements (when applicable) are obtained utilizing NASCET criteria, using the distal internal carotid diameter as the denominator. RADIATION DOSE REDUCTION: This exam was performed according to the departmental dose-optimization program which includes automated exposure control, adjustment of the mA and/or kV according to patient size and/or use of iterative reconstruction technique. CONTRAST:  60mL OMNIPAQUE IOHEXOL 350 MG/ML SOLN COMPARISON:  MRI head from the same day. CTA head/neck from 08/28/2018. FINDINGS: CTA NECK FINDINGS Aortic arch: Great vessel origins are patent without significant stenosis. Right carotid system: No evidence of dissection, stenosis (50% or greater), or occlusion. Mild atherosclerosis. Left carotid system: No evidence of dissection, stenosis (50% or greater), or occlusion. Mild atherosclerosis. Vertebral arteries: Evaluation of the proximal vertebral arteries limited by streak artifact. Non opacification of the left vertebral artery proximally and poor opacification of the right vertebral artery, suspicious for interval proximal occlusion and/or stenosis. Distal reconstitution bilaterally. Skeleton: Multilevel degenerative change. Other neck: Thyroid goiter with multiple nodules, as previously characterized. Upper chest: The visualized lung apices demonstrate areas of emphysema and fibrosis/scarring. 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 Moderate right cavernous/paraclinoid ICA stenosis. Bilaterally there is occlusive or near occlusive stenosis of the M1 MCAs, likely mildly progressed from the prior. There are collaterals bilaterally. The left A1 ACA is diminutive. The right A1 ACA more distal ACAs are patent. Posterior circulation: Small vertebrobasilar system. The left intradural vertebral artery is stenotic but remains patent. The right  intradural vertebral artery terminates as PICA, anatomic variant. Irregular opacification of the basilar artery, similar. The posterior communicating arteries largely supply the posterior cerebral areas which remain patent. Venous sinuses: As permitted by contrast timing, patent. Anatomic variants: Detailed above. Review of the MIP images confirms the above findings IMPRESSION: 1. In comparison to 2020 CTA, some progression of occlusive intracranial disease, suggestive of moyamoya type process and described above. 2. Evaluation of the proximal vertebral arteries limited by streak artifact with apparent non-opacification of the left vertebral artery proximally and poor opacification right vertebral artery, suspicious for interval proximal occlusion and/or stenosis. Distal reconstitution bilaterally. Intracranially, posterior cerebral arteries are largely supplied by posterior communicating arteries. Electronically Signed   By: Feliberto HartsFrederick S Jones M.Brooks.   On: 02/27/2022 08:34   MR BRAIN WO CONTRAST  Result Date: 02/27/2022 CLINICAL DATA:  Stroke follow-up, evaluate new hypodensity by CT. EXAM: MRI HEAD WITHOUT CONTRAST TECHNIQUE: Multiplanar, multiecho pulse sequences of the brain and surrounding structures were obtained without intravenous contrast. COMPARISON:  11/19/2020 FINDINGS: Brain: Small acute infarcts seen in the low right pontine medullary junction, left lateral medulla, and clustered in the left cerebellum. Pre-existing right cerebellar infarction. Small cortical infarcts have occurred along the cerebral convexities, greatest at the high right frontal and left parietal cortex. Brain volume is normal. No acute hemorrhage, hydrocephalus, or masslike finding Vascular: Grossly preserved flow voids when compared to prior. Skull and upper cervical spine: Normal marrow signal. Sinuses/Orbits: Negative. IMPRESSION: 1. Small acute infarcts in the left lateral medulla, right pontine medullary junction, and left  cerebellum. 2. Remote infarcts in the right cerebellum and bilateral cerebral cortex. Electronically Signed   By: Jorje Guild M.Brooks.   On: 02/27/2022 05:18   CT Head Wo Contrast  Result Date: 02/26/2022 CLINICAL DATA:  Transient ischemic attack. Nausea and vomiting. History of strokes. EXAM: CT HEAD WITHOUT CONTRAST TECHNIQUE: Contiguous axial images were obtained from the base of the skull through the vertex without intravenous contrast. RADIATION DOSE REDUCTION: This exam was performed according to the departmental dose-optimization program which includes automated exposure control, adjustment of the mA and/or kV according to patient size and/or use of iterative reconstruction technique. COMPARISON:  11/19/2020. FINDINGS: Brain: No acute intracranial hemorrhage, midline shift or mass effect. No extra-axial fluid collection. Encephalomalacia is present in the frontal lobe on the right, unchanged. There is a old infarct in the right cerebellar hemisphere. Scattered periventricular white matter hypodensities are present bilaterally. No hydrocephalus. A vague hypodense region is noted in the frontal lobe on the left which is new from the prior exam. Vascular: No hyperdense vessel or unexpected calcification. Skull: Normal. Negative for fracture or focal lesion. Sinuses/Orbits: A round density is present in the right maxillary sinus, possible mucosal retention cyst or polyp. No acute orbital abnormality. Other: None. IMPRESSION: 1. Vague hypodensity in the frontal lobe on the left which is new from the previous exam. MRI is suggested for further evaluation. 2. Chronic microvascular ischemic changes with old infarcts in the right cerebellar hemisphere and right frontal lobe. Electronically Signed   By: Brett Fairy M.Brooks.   On: 02/26/2022 21:31    Labs:  Basic Metabolic Panel: Recent Labs  Lab 03/07/22 0604 03/10/22 0545  NA 135 135  K 4.2 3.7  CL 105 103  CO2 21* 23  GLUCOSE 230* 230*  BUN 12 16   CREATININE 1.42* 1.14*  CALCIUM 8.8* 8.6*  MG 2.0  --     CBC: Recent Labs  Lab 03/07/22 0604 03/10/22 0545  WBC 9.3 8.4  NEUTROABS  --  4.9  HGB 14.2 13.4  HCT 42.1 39.5  MCV 84.9 85.1  PLT 270 301    CBG: Recent Labs  Lab 03/12/22 2140 03/13/22 0607 03/13/22 0928 03/13/22 1130 03/13/22 1641  GLUCAP 177* 112* 135* 139* 175*   Family history.  Mother with diabetes or CVA Brother with diabetes.  Denies any colon cancer esophageal cancer or rectal cancer  Brief HPI:   Michelle Brooks is a 57 y.o. right-handed female with history of chronic back pain diabetes mellitus CKD stage III obesity with BMI 40.12 hypertension prior right MCA CVA 2012 with loop recorder insertion maintained on aspirin, hyperlipidemia quit smoking 17 years ago.  Per chart review lives with her boyfriend.  Modified independent prior to admission.  Presented 02/26/2022 with nausea vomiting left  eyelid droop difficulty in swallowing and right-sided weakness.  CT/MRI showed small acute infarct in the left lateral medulla right pontine medullary junction and left cerebellum.  Remote infarcts in the right cerebellum and bilateral cerebral cortex.  CT angiogram head and neck in comparison to 2020 CTA with some progression of occlusive intracranial disease, suggestive of moyamoya type process.  Admission chemistries unremarkable except glucose 164 creatinine 1.09 hemoglobin A1c 8.7.  Patient did not receive tPA.  Echocardiogram with ejection fraction of 60 to 65% no wall motion abnormalities grade 1 diastolic dysfunction.  Neurology follow-up placed on low-dose aspirin and Plavix x 3 months then Plavix alone.  Her diet was advanced to a mechanical soft thin liquid diet.  Lovenox added for DVT prophylaxis.  Patient did have episode of SVT sinus tachycardia 1/22 given metoprolol converted to sinus tach and then to sinus rhythm.  No concern for A-fib at this time she was placed on metoprolol.  Therapy evaluations completed due  to patient decreased functional ability was admitted for a comprehensive rehab program.   Hospital Course: [facility] was admitted to rehab 03/07/2022 for inpatient therapies to consist of PT, ST and OT at least three hours five days a week. Past admission physiatrist, therapy team and rehab RN have worked together to provide customized collaborative inpatient rehab.  Pertaining to patient's right pontine left lateral medullary left cerebellar infarct remained stable she will continue low-dose aspirin and Plavix x 3 months then Plavix alone.  Follow-up neurology services.  Lovenox added for DVT prophylaxis.  Hospital course severe left upper extremity shoulder pain likely hyperalgesia related to left lateral medullary infarction x-ray showing calcification suggestive of calcific tendinosis she was placed on a trial of gabapentin as well as Voltaren gel with Lidoderm patch as well as Ultram as needed.  Her diet was advanced to mechanical soft.  She remained on Lipitor for hyperlipidemia.  Blood sugars was increased variables hemoglobin A1c her home regimen of insulin was resumed diabetic coordinator follow-up.  History of hypertension/episode of SVT Lopressor 25 mg twice daily increased 50 mg twice daily no chest pain or shortness of breath.  Noted obesity BMI 40.12 dietary follow-up.  CKD stage III latest creatinine 1.28.  Bouts of constipation resolved with laxative assistance.   Blood pressures were monitored on TID basis and soft and monitored  Diabetes has been monitored with ac/hs CBG checks and SSI was use prn for tighter BS control.    Rehab course: During patient's stay in rehab weekly team conferences were held to monitor patient's progress, set goals and discuss barriers to discharge. At admission, patient required moderate assist 80 feet rolling walker minimal assist sit to stand  Physical exam.  Blood pressure 167/83 pulse 77 temperature 98 respirations 18 oxygen saturation is 97% room  air Constitutional.  No acute distress HEENT Head.  Normocephalic and atraumatic Eyes.  Pupils round and reactive to light no discharge without nystagmus Neck.  Supple nontender no JVD without thyromegaly Cardiac regular rate and rhythm without any extra sounds or murmur heard Abdomen.  Soft nontender positive bowel sounds without rebound Respiratory effort normal no respiratory distress without wheeze Skin.  Warm and dry Musculoskeletal.  Left upper extremity tender to passive range of motion Neurologic.  Alert oriented left eye ptosis.  Speech slightly slurred but intelligible.  Right upper extremity and right lower extremity grossly 4-4/5.  Left upper extremity limited by pain question 2/5 left lower extremity 4/5 sensation decreased to light touch  He/She  has had improvement in activity tolerance, balance, postural control as well as ability to compensate for deficits. He/She has had improvement in functional use RUE/LUE  and RLE/LLE as well as improvement in awareness.  Ambulates 140 feet with +2 min assist for balance.  Navigates stairs contact-guard.  Working with energy conservation techniques.  Patient educated on ADLs.  Completed gentle bilateral upper extremity active range of motion exercises with supervision.  Minimal assist upper body bathing minimal assist lower body bathing minimal assist upper body dressing mod assist lower body dressing.  Full family teaching completed plan discharge to home       Disposition: Discharge to home    Diet: Diabetic diet  Special Instructions: No driving smoking or alcohol  Continue aspirin 81 mg daily and Plavix 75 mg day x 3 months total then Plavix alone  Medications at discharge 1.  Tylenol as needed 2.  Aspirin 81 mg p.o. daily 3.  Lipitor 80 mg p.o. daily 4.  Vitamin Brooks 400 units p.o. daily 5.  Plavix 75 mg p.o. daily 6.  Voltaren gel 2 g 3 times daily 7.  Colace 100 mg p.o. twice daily 8.  Pepcid 20 mg p.o. daily 9.   Neurontin 100 mg p.o. 3 times daily 10.  Humalog 50/50 40 units 3 times daily 11.  Lidoderm patch changes directed 12.  Lopressor 50 mg p.o. twice daily 13.  MiraLAX daily hold for loose stools   30-35 minutes were spent completing discharge summary and discharge planning  Discharge Instructions     Ambulatory referral to Neurology   Complete by: As directed    An appointment is requested in approximately: 4 weeks right pontine infarction   Ambulatory referral to Physical Medicine Rehab   Complete by: As directed    Moderate complexity follow up 1-2 weeks right pontine infarction        Follow-up Information     Kirsteins, Victorino Sparrow, MD Follow up.   Specialty: Physical Medicine and Rehabilitation Why: Office to call for appointment Contact information: [address] [place] [address] [place] [address] [phone]                 Signed: Charlton Amor 03/13/2022, 4:56 PM

## 2022-03-12 NOTE — Progress Notes (Signed)
Speech Language Pathology Daily Session Note  Patient Details  Name: Michelle Brooks MRN: [id number] Date of Birth: [birth year]  {chl ip rehab slp time calculations:304100500}  Short Term Goals: {BPZ:0258527}  Skilled Therapeutic Interventions: Skilled ST treatment focused on cognitive and swallowing goals. OT reported to SLP that pt consumed very large and multiple bites of eggs this morning resulting in a significant coughing episode. OT provided feedback for implementation of swallowing precautions as outlined in room, however pt reportedly stated she didn't need to implement any precautions and proceeded with eating in an unsafe manner. SLP reviewed diet recommendations and individualized swallowing precautions. Pt recalled 1 of 3 strategies independent of cues and required min A cues to recall 3/3 following 5 minute delay. Placed sign across from bed to reinforce education.   SLP facilitated a medication task involving discussion on current medication regime. SLP wrote down medications by name, purpose, dosage, and frequency on med chart. Pt identified pertinent information with mod I. Pt was not pleased with how many medications she's taking and felt as though she should not have to take so many blood pressure medications being diabetic. SLP encouraged pt to discuss concerns with nurse and/or MD. SLP facilitated BID pillbox organization with overall min A verbal cues for comprehension of medication labels, organization, and error awareness. Pt was reluctant to "double check" pillbox for accuracy and therefore did not recognize error until requested to "triple check" box. Pt participated in treatment, however often minimized errors and appeared very uninterested in education and interventions.   Patient was left in bed with alarm activated and immediate needs within reach at end of session. Continue per current plan of care.      Pain  None/denied  Therapy/Group: Individual Therapy  Patty Sermons 03/12/2022, 1:29 PM

## 2022-03-12 NOTE — Progress Notes (Signed)
Occupational Therapy Session Note  Patient Details  Name: Michelle Brooks MRN: [id number] Date of Birth: 06-02-1965  Today's Date: 03/12/2022 OT Individual Time: 9678-9381 OT Individual Time Calculation (min): 72 min    Short Term Goals: Week 1:  OT Short Term Goal 1 (Week 1): Patient will demonstrate 30 degrees of active assisted right shoulder flexion in gravity eliminated position with no more than 2 point increase in pain OT Short Term Goal 2 (Week 1): Patient will complete toileting with contact guard assist OT Short Term Goal 3 (Week 1): Patienyt will complete tub shower transfer with min assist with use of tub trasnfer bench OT Short Term Goal 4 (Week 1): Patient will utilize Right arm motion to place into shirt sleeve with min cueing OT Short Term Goal 5 (Week 1): Patient will dress lower body (excluding TED hose) with intermittent  min assist  Skilled Therapeutic Interventions/Progress Updates:     Pt received using bathroom with nursing staff. Pt presenting to be in good spirits and receptive to skilled OT session. Pt reporting ache in shoulder, but no significant pain. Pt able to complete 3/3 toileting tasks with CGA utilizing RUE to complete anterior and posterior peri-care. Pt ambulated to sink using RW CGA and washed hands in standing CGA.  Pt requested to finish eating her breakfast at beginning of session session. Upon sitting EOB, Pt noted to quickly eat three moderately large bites of eggs quickly. Pt instructed to slow pace of eating and take smaller bites to improve swallow and decrease chances of choking. Pt unreceptive of education taking another large bite of eggs and then proceeding to have significant coughing spitting up eggs. Pt initiated turning head to L to support swallow with min cueing with noted improvement. Pt politely refused to finish rest of meal d/t mild fear of choking. Pt presenting to be moderately defiant to following therapist recommendations for  chewing/pacing to prevent choking.  Pt requesting to take shower this AM. Pt able to ambulate within room using RW CGA to gather clothing for the day. Pt ambulated to bathroom and transferred to tub bench CGA. Pt required mod verbal cues to sit when doffing clothing to increase safety with Pt able to complete tasks with CGA. Pt completed UB bathing supervision and LB bathing CGA when standing to wash bottom. Pt required mod verbal cues to initiate using LUE during bathing with Pt using RUE ~25% of time to wash abdomin and LUE. Pt sit>stand ambulating to EOB using RW CGA. Donned shirt supervision and pants CGA.  Pt receptive to completing dynamic standing grooming tasks at sink. Pt able to maintain balance with close supervision to CGA with single UE support on RW or sink.  Pt participated in gentle AAROM of LUE while seated EOB. Pt demonstrating increased pain in shoulder protraction retraction, posterior shoulder rolls, and inferior reaching in gravity eliminated pain. Increased education provided on importance of using LUE for gentle AROM through pain free zone to maintain strength and ROM.  Pt returned to bed CGA and was left resting in bed with call bell in reach, bed alarm on, and all needs met.   Therapy Documentation Precautions:  Precautions Precautions: Fall Precaution Comments: L UE pain, ataxia Restrictions Weight Bearing Restrictions: No General: General PT Missed Treatment Reason: Patient unwilling to participate Vital Signs:  Pain:   ADL: ADL Eating: Minimal assistance Where Assessed-Eating: Chair Grooming: Minimal assistance Where Assessed-Grooming: Sitting at sink Upper Body Bathing: Minimal assistance Where Assessed-Upper Body Bathing: Shower Lower  Body Bathing: Minimal assistance Where Assessed-Lower Body Bathing: Shower Upper Body Dressing: Minimal assistance Where Assessed-Upper Body Dressing: Sitting at sink Lower Body Dressing: Moderate assistance Where  Assessed-Lower Body Dressing: Sitting at sink, Standing at sink Toileting: Minimal assistance Where Assessed-Toileting: Glass blower/designer: Psychiatric nurse Method: Counselling psychologist: Energy manager: Unable to assess Tub/Shower Transfer Method: Unable to assess [employer] Transfer: Minimal assistance Social research officer, government Method: Heritage manager: Radio broadcast assistant, Grab bars   Therapy/Group: Individual Therapy  Janey Genta 03/12/2022, 9:47 AM

## 2022-03-12 NOTE — Progress Notes (Signed)
Patient ID: Michelle Brooks, female   DOB: [birth year], 57 y.o.   MRN: [id number]  [facility] referral sent to [facility]

## 2022-03-12 NOTE — Progress Notes (Addendum)
Patient administered 60  units of home Novolog 50/50;pharmacy unble to modifiy MAR to reflect. BS checked 59mins post admin, results 270

## 2022-03-12 NOTE — Progress Notes (Signed)
Patient ID: Michelle Brooks, female   DOB: 1965/04/21, 57 y.o.   MRN: [id number]  Team Conference Report to Patient/Family  Team Conference discussion was reviewed with the patient and caregiver, including goals, any changes in plan of care and target discharge date.  Patient and caregiver express understanding and are in agreement.  The patient has a target discharge date of 03/15/22.   SW met with patient and called daughter, Hermera at bedside by request. Patient pleased with discharge date and feel as she will be able to mange CIR until Saturday. Patient reports she feels that she is better at managing her diabetes than staff. Insulin from home has been obtained and has been administered. Patient reports with this her blood sugar levels has decreased.  Patient expressed request for [facility]. Patient already has a rolling walker in the home and will obtain her shower seat privately. Daughter requesting FMLA paperwork to be completed before discharge. No additional questions or concerns.   Dyanne Iha 03/12/2022, 1:16 PM

## 2022-03-12 NOTE — Progress Notes (Signed)
Physical Therapy Session Note  Patient Details  Name: Michelle Brooks MRN: [id number] Date of Birth: [birth year]  Today's Date: 03/12/2022 PT Individual Time: 5009-3818 and 2993-7169 PT Individual Time Calculation (min): 19 min and 64 min (make up time for 19 minutes provided)  and  Today's Date: 03/12/2022 PT Missed Time: 11 Minutes Missed Time Reason: Patient unwilling to participate  Short Term Goals: Week 1:  PT Short Term Goal 1 (Week 1): Pt will perform supine<>sit using adjustable base bed features with CGA PT Short Term Goal 2 (Week 1): Pt will perform bed<>chair transfers using LRAD with CGA PT Short Term Goal 3 (Week 1): Pt will ambulate at least 189ft using LRAD with CGA PT Short Term Goal 4 (Week 1): Pt will navigate at least 8 steps using HR with CGA PT Short Term Goal 5 (Week 1): Pt will participate in standardized outcome measure to assess her fall risk  Skilled Therapeutic Interventions/Progress Updates:    Session 1: Pt received supine in bed and upon initiating conversation with pt she expresses being upset that her BS is elevated and states she is waiting for her significant other to bring her medications from home. Pt upset that he has not arrived yet and starts to call him from her cell phone with no answer - therapist educates pt on use of exercise to assist with lowering her BS. Pt becomes irritated reiterating that she is "ready to get out of here and go home." Therapist provides gentle education to pt on purpose of PT and goal of ensuring she has a safe functional mobility plan upon D/C as well as education on importance of not refusing therapy. Pt states "I'm not refusing" so then therapist provided gentle education on therapy schedule and planned session at this time with need to participate in functional mobility and therapeutic interventions to be considered not refusing. Pt continues to become upset/irritated stating she is ready to go home and pt unwilling  to initiate  OOB mobility at this time, perseverating on needing her medications from home. Pt agreeable for therapist to return at scheduled time this afternoon. Pt left supine in bed with needs in reach and bed alarm on. Missed 11 minutes of skilled physical therapy.   Session 2: Pt received supported upright in bed with her SO, Michelle Brooks, present and pt about to eat food he brought up to her. Pt agreeable to therapy session with min encouragement as pt excited about her D/C date. Pt's POC was modified due to pt's strong desire to go home and decreased participation in therapy sessions. Therapy session focused on family education with hands-on training to prepare for earlier than anticipated D/C and to ensure pt safety. Transitioned to sitting EOB with supervision. Pt spontaneously/impulsively came to standing from EOB with supervision for safety and then with CGA/light min assist performed L stand pivot to w/c.Thearpist educated pt/SO on recommendation that pt use RW (not rollator) at D/C to increase her safety and independence with mobility.   Transported to/from gym in w/c for time management and energy conservation.  Gait training ~6ft using RW with min assist of 1 for balance due to pt continuing to have ataxia/incoordination in L LE causing slight adduction with mild L lateral lean causing pt to rely heavily on RW to maintain balance. Pt also continues to have significantly decreased gait speed with pause in double-limb support after each step to regain her balance. Cuing/education on taking small, reciprocal stepping pattern as pt often overshoots with LLE  causing further imbalance.  Stair navigation training ascending/descending 4 steps (6" height) using B UE support on L HR only to simulate home environment with CGA - therapist providing education/training on how to properly guard pt - step-to pattern leading with R LE on ascent and L LE on descent.  Pt ambulated ~39ft back to w/c using RW with her SO  providing min assist for balance and therapist providing cuing/feedback on how to ensure properly guarding with use of gait belt and his positioning.   Stair navigation training in stairwell ascending/descending 11 steps using L HR only with therapist initially providing light min assist and then pt's SO providing assistance for half of the flight of stairs in both directions - pt continues to perform step-to pattern as above - therapist providing mod/max cuing for pt's SO to correctly/safely guard pt while ensuring he is safe with his own balance. Educated that they will need a 3rd person to assist with DME management to carry it up/down the stairs - both confirm the pt's nephew can do this.  Pt in agreement with HHPT follow-up stating she wants to limit the amount of time she has to navigate stairs to enter/exit their home. Therapist discussed recommendation for a wheelchair given change in pt's POC having shorter LOS and pt going home requiring increased assistance than initially anticipated - pt/family in agreement with this.  Pt ambulated ~66ft in ADL apartment and bathroom using RW with her SO providing min assist for balance and therapist continuing to provide min/mod cuing for proper guarding of pt and educating pt on allowing SO room on her L side as much as possible - pt continues to have L lean bias.  Therapist demonstrated how to perform ambulatory car transfer using RW while providing education on technique. Simulated ambulatory car transfer (SUV height) using RW with pt's SO providing min assist for balance with min/mod cuing from therapist for safe/proper technique.   Pt and SO appreciative of the education and both report feeling much more confident with her functional mobility and ensuring her safety at D/C. Pt's SO assisted pt with short ~37ft ambulatory transfer back to EOB using RW with min cuing from therapist. Pt left in the bed with needs in reach, bed alarm on, and her SO  present.   Therapy Documentation Precautions:  Precautions Precautions: Fall Precaution Comments: L UE pain, ataxia Restrictions Weight Bearing Restrictions: No   Pain:  Session 1: No reports of pain throughout session.  Session 2: No reports of pain throughout session.   Therapy/Group: Individual Therapy  Tawana Scale , PT, DPT, NCS, CSRS 03/12/2022, 7:46 AM

## 2022-03-12 NOTE — Patient Care Conference (Signed)
Inpatient RehabilitationTeam Conference and Plan of Care Update Date: 03/12/2022   Time: 10:44 AM    Patient Name: Michelle Brooks      Medical Record Number: [id number]  Date of Birth: January 16, 1966 Sex: Female         Room/Bed: 4W01C/4W01C-01 Payor Info: Payor: Theme park manager MEDICARE / Plan: Charmian Muff DUAL COMPLETE / Product Type: *No Product type* /    Admit Date/Time:  03/07/2022  2:42 PM  Primary Diagnosis:  Right pontine cerebrovascular accident [facility])  Hospital Problems: Principal Problem:   Right pontine cerebrovascular accident [facility])    Expected Discharge Date: Expected Discharge Date: 03/15/22  Team Members Present: Physician leading conference: Dr. Alysia Penna Social Worker Present: Erlene Quan, BSW Nurse Present: Dorien Chihuahua, RN PT Present: Page Spiro, PT OT Present: Other (comment) Lowella Fairy, OT) SLP Present: Sherren Kerns, SLP PPS Coordinator present : Gunnar Fusi, SLP     Current Status/Progress Goal Weekly Team Focus  Bowel/Bladder   continent of b/b   Remain continent   Assist with tolieting qshift and prn    Swallow/Nutrition/ Hydration   dysphagia 3 diet with thin liquids - min A. Some non-compliance with diet has been reported despite education(pt consuming regular textures). Pt also indicated she doesn't find the importance of swallow precautions/compensations since she never dealt with dysphagia from previous strokes, almost to suggest "why is this necessary now?" SLP provided education on dysphagia showed pt video from [facility] to reinforce education and clinical rational for compensations (head turn L, multiple swallows, and alternate between bites/sips)   sup A  diet tolerance with implementation of swallowing precautions and strategies, diet advancement as appropriate    ADL's   UB bathing close supervision, LB bathing CGA using grab bars, UB dressing supervision, LB dressing min A, toileting min A, toilet transfer min A,  shower transfer min A, grooming supervision; requires max motivation to use LUE d/t pain, Pt perseverating on wanting to go home during sessions. Pt also limited by pain in LUE   mod I to superivison   endurance training, energy conservation education, fall prevention education, fucntional mobility training, transfer training, ADL retraining, L NMR    Mobility   CGA/min assist supine<>sit, CGA sit<>stand and stand pivot transfers using RW, gait up to 140ft using +2 B HHA min assist demoing L LE ataxia/incoordination and minor L LOB tendencies, 12 stairs L HR only to simulte home with CGA - pt now perseverating on wanting to go home and would benefit from neuropsych consult due to reporting feeling "down" since having another CVA - pt has also been limited in therapy participation a couple of times due to tachycardia   supervision transfers and gait  activity tolerance, pt education, transfer training, gait training, stair navigation training, DME training, dynamic standing balance, L hemibody coordination    Communication                Safety/Cognition/ Behavioral Observations  Patient scored 16/30 on SLUMS with notable impairments in attention, memory, cognitive flexibility, and executive functioning (organization, self monitoring) - participation limited by pt unwilling to participate in therapeutic tasks and sadness attributed to her stroke. Recommend Neuropsych   sup   recall, attention    Pain   no c/o pain   Remain pain free   Assess qshift and prn    Skin   Skin intact   Maintain skin integrity  Assess qshift and prn      Discharge Planning:  Discharging home with family to  assist (S.O., daughter and nephew) 24/7. 15 steps to enter   Team Discussion: Patient post right pontine CVA with SVD and multi stroke hx. Patient declined recommendation for shoulder injection to treat shoulder pain and dietician referral. Perseverating on home medication/insulin with poor awareness  of deficits along with attention and memory deficits. Patient has declined participation in therapy and requests to leave ASAP; will need some time to adjust medications and could benefit from therapy.  Patient on target to meet rehab goals: Currently needs close supervision for showering and CGA for standing. Able to ambulate using a RW and manage steps with CGA.  Goals for discharge set for CGA - mod I overall.   *See Care Plan and progress notes for long and short-term goals.   Revisions to Treatment Plan:  Diabetic Coordinator referral   Teaching Needs: Safety, medications, dietary modification, transfers, toileting, etc.  Current Barriers to Discharge: Decreased caregiver support, Home enviroment access/layout, and 15 steps to entry of home  Possible Resolutions to Barriers: Family education HH follow up services DME: Surveyor, mining Summary Current Status: pt off home regimen for DM, Left shoulder pain, CVA related  Barriers to Discharge: Behavior/Mood;Uncontrolled Diabetes;Uncontrolled Hypertension   Possible Resolutions to [employer]: restart home Humalog, DM coordinator assist   Continued Need for Acute Rehabilitation Level of Care: The patient requires daily medical management by a physician with specialized training in physical medicine and rehabilitation for the following reasons: Direction of a multidisciplinary physical rehabilitation program to maximize functional independence : Yes Medical management of patient stability for increased activity during participation in an intensive rehabilitation regime.: Yes Analysis of laboratory values and/or radiology reports with any subsequent need for medication adjustment and/or medical intervention. : Yes   I attest that I was present, lead the team conference, and concur with the assessment and plan of the team.   Dorien Chihuahua B 03/12/2022, 2:18 PM

## 2022-03-12 NOTE — Progress Notes (Signed)
Pt CBG is 431 this am. Pt is refusing to take sliding scale insulin. Pt states that her family is bringing her medication from home this am. PA Dan notified. This nurse will inform oncoming day shift nurse to contact diabetes coordinator to approve home medication. Pt is asymptomatic to the hyperglycemic episode. No further concerns at this time, call bell in reach.

## 2022-03-13 LAB — GLUCOSE, CAPILLARY
Glucose-Capillary: 112 mg/dL — ABNORMAL HIGH (ref 70–99)
Glucose-Capillary: 135 mg/dL — ABNORMAL HIGH (ref 70–99)
Glucose-Capillary: 139 mg/dL — ABNORMAL HIGH (ref 70–99)
Glucose-Capillary: 175 mg/dL — ABNORMAL HIGH (ref 70–99)
Glucose-Capillary: 219 mg/dL — ABNORMAL HIGH (ref 70–99)

## 2022-03-13 NOTE — Progress Notes (Signed)
Speech Language Pathology Daily Session Note  Patient Details  Name: Michelle Brooks MRN: [id number] Date of Birth: May 05, 1965  Today's Date: 03/13/2022 SLP Individual Time: 3235-5732 SLP Individual Time Calculation (min): 41 min  Short Term Goals: Week 1: SLP Short Term Goal 1 (Week 1): Pt will tolerate dys 3 diet with use of compensatory strategies with minA verbal cues. SLP Short Term Goal 2 (Week 1): Pt will recall 3 memory strategies to assist with recall of important information with minA verbal cues. SLP Short Term Goal 3 (Week 1): Pt will demonstrate sustained attention to therapeutic task with occasional redirection. SLP Short Term Goal 4 (Week 1): Pt will demonstrate ability to manage medications by comprehending and completing medication management task with minA.  Skilled Therapeutic Interventions: Skilled ST treatment focused on cognitive goals. Pt was greeted upright in wheelchair on arrival. Pt appeared surprised to hear she had another therapy session, as she expressed "I have therapy again? I just got back".   SLP transported pt to the ortho gym and facilitated the following tasks using the BITS:  - Working memory, sustained attention (5 minutes), sequencing to recall words + pictures in sequential order with 67% accuracy given sup A verbal cues.  - Working Marine scientist, sustained attention (5 minutes), to recall randomized words with 64% accuracy given min A verbal cues. - Trail making (alternating between numbers and letters) with mod A verbal and visual cues to complete, appropriately sequence, and recall to alternate btw numbers and letters.   SLP provided education and demonstration on memory strategies throughout session to improve accuracy on the above tasks including repetition strategy, visualization, and association.   Patient was transported back to room with alarm activated and immediate needs within reach at end of session. Continue per current plan of care.       Pain Pain Assessment Pain Scale: 0-10 Pain Score: 0-No pain  Therapy/Group: Individual Therapy  Patty Sermons 03/13/2022, 10:47 AM

## 2022-03-13 NOTE — Progress Notes (Signed)
Occupational Therapy Session Note  Patient Details  Name: Michelle Brooks MRN: [id number] Date of Birth: [birth year]  Today's Date: 03/13/2022 OT Individual Time: 2542-7062 OT Individual Time Calculation (min): 42 min    Short Term Goals: Week 1:  OT Short Term Goal 1 (Week 1): Patient will demonstrate 30 degrees of active assisted right shoulder flexion in gravity eliminated position with no more than 2 point increase in pain OT Short Term Goal 2 (Week 1): Patient will complete toileting with contact guard assist OT Short Term Goal 3 (Week 1): Patienyt will complete tub shower transfer with min assist with use of tub trasnfer bench OT Short Term Goal 4 (Week 1): Patient will utilize Right arm motion to place into shirt sleeve with min cueing OT Short Term Goal 5 (Week 1): Patient will dress lower body (excluding TED hose) with intermittent  min assist  Skilled Therapeutic Interventions/Progress Updates:     Pt received sitting up in wc presenting to be in good spirits and receptive to skilled OT session. WC technician present in room. Focused beginning of session on assisting Pt with functional mobility transfers to and from new wc for trial for wc fit and appropriateness of wc to take home upon d/c. Pt able to complete stand pivot and stand step transfer using RW CGA.   Discussed home-set with Pt with education provided on fall prevention and energy conservation to increase safety and participation upon d/c. Pt active during conversation and presenting receptive to education. Discussed and provided education on follow up therapy upon d/c with Pt receptive to receiving [facility] services at d/c.   Pt transported total A to therapy gym for energy conservation and time management. Pt completed functional mobility tasks with facilitation of lifting feet higher off ground over obstacles and step through pattern facilitated during task. Pt able to complete task at overall CGA x2 trials with mod cueing  required for weight shifting, step length, and step height to increase safety.   Pt transported to ortho gym total A in wc at end of session for group TR session. Pt handed of to TR, [place], with all immediate needs met.   Therapy Documentation Precautions:  Precautions Precautions: Fall, Other (comment) Precaution Comments: L UE pain, ataxia, monitor HR Restrictions Weight Bearing Restrictions: No General:   Vital Signs: Therapy Vitals Temp: 97.7 F (36.5 C) Pulse Rate: 65 Resp: 18 BP: (Abnormal) 143/89 Patient Position (if appropriate): Sitting Oxygen Therapy SpO2: 98 % O2 Device: Room Air Pain: Pain Assessment Pain Scale: 0-10 Pain Score: 0-No pain ADL: ADL Eating: Minimal assistance Where Assessed-Eating: Chair Grooming: Minimal assistance Where Assessed-Grooming: Sitting at sink Upper Body Bathing: Minimal assistance Where Assessed-Upper Body Bathing: Shower Lower Body Bathing: Minimal assistance Where Assessed-Lower Body Bathing: Shower Upper Body Dressing: Minimal assistance Where Assessed-Upper Body Dressing: Sitting at sink Lower Body Dressing: Moderate assistance Where Assessed-Lower Body Dressing: Sitting at sink, Standing at sink Toileting: Minimal assistance Where Assessed-Toileting: Glass blower/designer: Psychiatric nurse Method: Counselling psychologist: Energy manager: Unable to assess Tub/Shower Transfer Method: Unable to assess [employer] Transfer: Minimal assistance Social research officer, government Method: Heritage manager: Radio broadcast assistant, Grab bars  Therapy/Group: Individual Therapy  Janey Genta 03/13/2022, 1:28 PM

## 2022-03-13 NOTE — Progress Notes (Addendum)
Patient ID: Michelle Brooks, female   DOB: [birth year], 57 y.o.   MRN: [id number]  Wheelchair ordered through [employer] Patient approved. Orders send [place]: 2/5

## 2022-03-13 NOTE — Progress Notes (Signed)
Physical Therapy Discharge Summary  Patient Details  Name: Michelle Brooks MRN: [id number] Date of Birth: 08-Jan-1966  Date of Discharge from PT service:March 14, 2022  Today's Date: 03/14/2022       Patient has met 7 of 9 long term goals due to improved activity tolerance, improved balance, improved postural control, increased strength, ability to compensate for deficits, functional use of  left upper extremity and left lower extremity, improved attention, improved awareness, and improved coordination.  Patient to discharge at an ambulatory level Min Assist using RW. Patient's care partner is independent to provide the necessary physical assistance at discharge.  Reasons goals not met: discharge prior to ELOS recommended by PT  Recommendation:  Patient will benefit from ongoing skilled PT services in home health setting to continue to advance safe functional mobility, address ongoing impairments in L hemibody NMR and coordination due to ataxia, dynamic standing balance, dynamic gait training using LRAD, midline orientation and minimize fall risk.  Equipment: 20x18in wheelchair, pt already has RW  Reasons for discharge: treatment goals met and discharge from hospital Patient requesting to D/C home earlier than estimated POC at initial evaluation and pt's SO participated in hands-on education/training and reports feeling comfortable/confident assisting pt at [place].  Patient/family agrees with progress made and goals achieved: Yes  PT Discharge Precautions/Restrictions Precautions Precautions: Fall;Other (comment) Precaution Comments: L UE pain, ataxia, monitor HR Restrictions Weight Bearing Restrictions: No Pain  Pain Assessment Pain Scale: 0-10 Pain Score: 0-No pain Pain Interference Pain Interference Pain Effect on Sleep: 2. Occasionally Pain Interference with Therapy Activities: 1. Rarely or not at all Pain Interference with Day-to-Day Activities: 1. Rarely or not at  all Vision/Perception  Vision - History Ability to See in Adequate Light: 1 Impaired Perception Perception: Within Functional Limits Praxis Praxis: Intact  Cognition Overall Cognitive Status: Impaired/Different from baseline Arousal/Alertness: Awake/alert Orientation Level: Oriented X4 Year: 2024 Month: February Day of Week: Correct Attention: Focused;Sustained Focused Attention: Appears intact Sustained Attention: Appears intact Awareness: Appears intact Safety/Judgment: Appears intact Comments: pt does tend to perseverate on certain topics of conversation Sensation Sensation Light Touch: Appears Intact (in B LEs) Hot/Cold: Not tested Proprioception: Impaired by gross assessment (during functional mobility pt appears to have impaired awareness of L LE positioning) Stereognosis: Not tested Additional Comments: reports L UPPER side of her face feels numbness/tingling Coordination Gross Motor Movements are Fluid and Coordinated: No Coordination and Movement Description: GM coordination impaired in LEs due to L hemiparesis and ataxia Motor  Motor Motor: Other (comment) Motor - Discharge Observations: mild L hemiparesis with ataxia, significant L shoulder pain  Mobility Bed Mobility Bed Mobility: Supine to Sit;Sit to Supine Supine to Sit: Supervision/Verbal cueing Sit to Supine: Supervision/Verbal cueing Transfers Transfers: Sit to Stand;Stand to Sit;Stand Pivot Transfers Sit to Stand: Supervision/Verbal cueing Stand to Sit: Supervision/Verbal cueing Stand Pivot Transfers: Supervision/Verbal cueing  Stand Pivot Transfer Details: Verbal cues for safe use of DME/AE;Verbal cues for precautions/safety;Verbal cues for gait pattern Transfer (Assistive device): Rolling walker Locomotion  Gait Ambulation: Yes Gait Assistance: Minimal Assistance - Patient > 75%;Contact Guard/Touching assist (primarily CGA) Gait Distance (Feet): 160 Feet Assistive device: Rolling walker (pt did  not feel safe trying gait without AD) Gait Assistance Details: Verbal cues for technique;Verbal cues for safe use of DME/AE;Verbal cues for precautions/safety;Tactile cues for weight shifting;Verbal cues for sequencing;Verbal cues for gait pattern;Tactile cues for sequencing;Tactile cues for posture Gait Assistance Details: tactile cuing at L hip for improved L stance stability Gait Gait: Yes Gait Pattern:  Impaired Gait Pattern: Step-through pattern;Poor foot clearance - left;Poor foot clearance - right;Ataxic (started with step-to pattern varying lead LE, slowly progressed to reciprocal) Gait velocity: significantly decreased with increased time in double limb support due to L LE ataxia and L lateral lean bias Stairs / Additional Locomotion Stairs: Yes Stairs Assistance: Contact Guard/Touching assist Stair Management Technique: Step to pattern;One rail Left;Forwards (step-to leading with R LE on ascent and L LE on descent) Number of Stairs: 12 Height of Stairs: 6 Pick up small object from the floor assist level: Contact Guard/Touching assist Pick up small object from the floor assistive device: RW and Runner, broadcasting/film/video Wheelchair Mobility: No  Trunk/Postural Assessment  Cervical Assessment Cervical Assessment: Exceptions to [employer] (slight forward head) Thoracic Assessment Thoracic Assessment: Exceptions to [facility] (rounded shoulders with guarding in L UE due to pain) Lumbar Assessment Lumbar Assessment: Exceptions to [facility] (flexible posterior pelvic tilt in sitting) Postural Control Postural Control: Deficits on evaluation (requires UE support on RW due to L lean/LOB tendency)  Balance Balance Balance Assessed: Yes Static Sitting Balance Static Sitting - Balance Support: Feet supported Static Sitting - Level of Assistance: 6: Modified independent (Device/Increase time) Dynamic Sitting Balance Dynamic Sitting - Balance Support: Feet supported Dynamic Sitting - Level of Assistance:  6: Modified independent (Device/Increase time) Static Standing Balance Static Standing - Balance Support: During functional activity;Bilateral upper extremity supported Static Standing - Level of Assistance: 5: Stand by assistance;Other (comment) (CGA) Dynamic Standing Balance Dynamic Standing - Balance Support: During functional activity;Bilateral upper extremity supported Dynamic Standing - Level of Assistance: 4: Min assist;Other (comment) (CGA) Extremity Assessment      RLE Assessment RLE Assessment: Within Functional Limits Active Range of Motion (AROM) Comments: WFL/WNL General Strength Comments: assessed in sitting RLE Strength Right Hip Flexion: 5/5 Right Knee Flexion: 5/5 Right Knee Extension: 5/5 Right Ankle Dorsiflexion: 5/5 Right Ankle Plantar Flexion: 5/5 LLE Assessment LLE Assessment: Exceptions to [facility] General Strength Comments: assessed in sitting LLE Strength Left Hip Flexion: 4+/5 Left Knee Flexion: 4+/5 Left Knee Extension: 4+/5 Left Ankle Dorsiflexion: 4+/5 Left Ankle Plantar Flexion: 4+/5 LLE Tone LLE Tone Comments: L LE ataxia noted during gait   Tawana Scale , PT, DPT, NCS, CSRS 03/13/2022, 8:14 AM  Donn Wilmot, SPT 1:43 PM 03/14/22

## 2022-03-13 NOTE — Progress Notes (Signed)
Patient ID: Michelle Brooks, female   DOB: [birth year], 57 y.o.   MRN: [id number]  Patient Wheelchair delivered to the room. Daughter, Hermera informed.

## 2022-03-13 NOTE — Group Note (Signed)
Patient Details Name: Michelle Brooks MRN: [id number] DOB: 27-Jan-1966 Today's Date: 03/13/2022  Time Calculation:1400-1500    Group Description: Stress management: Pt participated in group session with a focus on stress mgmt, education provided on healthy coping strategies, and social interaction. Focus of session on providing coping strategies to manage new diagnosis to allow for improved mental health to increase overall quality of life . Discussed how to break down stressors into "daily hassles," "major life stressors" and "life circumstances" in an effort to allow pts to chunk their stressors into groups and determine where to best put their efforts/time when dealing with stress. Provided active listening, emotional support and therapeutic use of self. Offered education on factors that protect [place] against stress such as "daily uplifts," "healthy coping strategies" and "protective factors." Encouraged all group members to make an effort to actively recall one event from their day that was a daily uplift in an effort to protect their mindset from stressors as well as sharing this information with their caregivers to facilitate improved caregiver communication and decrease overall burden of care.  Issued pt handouts on healthy coping strategies to implement into routine.   Individual level documentation: Patient participated with full collaboration during session.   Pain:no c/o   [name] 03/13/2022, 3:16 PM

## 2022-03-13 NOTE — Progress Notes (Signed)
Speech Language Pathology Discharge Summary  Patient Details  Name: Michelle Brooks MRN: [id number] Date of Birth: 29-Aug-1965  Date of Discharge from SLP service:March 14, 2022  Today's Date: 03/14/2022 SLP Individual Time: 0930-1015 SLP Individual Time Calculation (min): 45 min  Skilled Therapeutic Interventions:  Skilled ST treatment focused on education in preparation of discharge tomorrow. Pt was greeted upright in wheelchair on arrival with many questions regarding swallow function and precautions. Pt had minimal recall of strategies with the exception of L head turn. Pt was unable to recall where to locate list of strategies (1. at end of bed, 2. across from bed on closet door). SLP reviewed each strategy in detail and provided clinical rationale for each. Pt was not willing to review results of MBS for reinforcement, stated "I don't need that." SLP requested for pt to demonstrate use of strategies with sips of liquid, as well attempts to facilitate pharyngeal strengthening exercises via effortful swallow maneuver. Pt declined consideration stating "I don't feel like it", despite expressing to therapist "y'all have a whole lot to do before I go home tomorrow" earlier in the session. Considering pt was unwilling to participate in assessment of PO tolerance, implementation of swallowing strategies, or participate in swallowing exercises, SLP provided pt with handouts reinforcing information that was previously discussed. Pt was receptive to education on cognition and strategies to enhance attention, memory, and safety within home environment. Pt verbalized understanding of education. Placed all handouts in pt's stroke binder. Patient was left in wheelchair with alarm activated and immediate needs within reach at end of session.   Patient has met 2 of 6 long term goals.  Patient to discharge at [facility] level.  Reasons goals not met: See summary for details   Clinical Impression/Discharge  Summary: Pt has demonstrated average progress towards swallowing and cognitive-linguistic goals meeting 2 out of 6 long-term goals this admission. Progression has been limited with swallowing goals due to inconsistent adherence to diet recommendations and safe swallowing precautions in effort to minimize aspiration risk and further complications that may occur as result of airway invasion. Pt expressed she was not interested in addressing swallowing needs and did not feel modified diet nor individualized precautions were necessary despite multiple education attempts by SLP and other therapy team members. Pt is currently on a dysphagia 3 diet with thin liquids with min A to implement strategies including left head turn, multiple swallows, alternate between bites/sips, small bites, slow rate, and one bite at a time. Pt is currently completing functional and mildly complex cognitive tasks with supervision-to-min A cues in regards to short-term recall, attention, problem solving skills, and awareness. Pt/family education completed. Care partner is independent to provide the necessary physical and cognitive assistance at discharge. Recommend supervision and assistance with completion of all iADL tasks to include medication and financial management, in addition to ongoing ST intervention at next level of care in home health or outpatient setting  to maximize cognitive-linguistic function, oropharyngeal dysphagia, and functional independence per pt willingness.   Care Partner:  Caregiver Able to Provide Assistance: Yes  Type of Caregiver Assistance: Cognitive;Physical  Recommendation:  Outpatient SLP;Other (comment) (intermittent supervision)  Rationale for SLP Follow Up: Maximize cognitive function and independence;Maximize swallowing safety   Equipment: None   Reasons for discharge: Discharged from hospital   Patient/Family Agrees with Progress Made and Goals Achieved: Yes    Aadin Gaut T  Shakala Marlatt 03/14/2022, 12:25 PM

## 2022-03-13 NOTE — Progress Notes (Signed)
Physical Therapy Session Note  Patient Details  Name: Michelle Brooks MRN: [id number] Date of Birth: Dec 30, 1965  Today's Date: 03/13/2022 PT Individual Time: 4680-3212 PT Individual Time Calculation (min): 60 min   Short Term Goals: Week 1:  PT Short Term Goal 1 (Week 1): Pt will perform supine<>sit using adjustable base bed features with CGA PT Short Term Goal 2 (Week 1): Pt will perform bed<>chair transfers using LRAD with CGA PT Short Term Goal 3 (Week 1): Pt will ambulate at least 14ft using LRAD with CGA PT Short Term Goal 4 (Week 1): Pt will navigate at least 8 steps using HR with CGA PT Short Term Goal 5 (Week 1): Pt will participate in standardized outcome measure to assess her fall risk  Skilled Therapeutic Interventions/Progress Updates:    Pt received supine in bed taking increased time to arouse fully and initiate OOB mobility, but is agreeable to therapy session. Pt states hearing her D/C date "inspired" her to want to do better. Supine>sitting R EOB, HOB slightly elevated, with supervision and increased time/effort. Sit<>stands using RW with close supervision for safety. Gait in/out bathroom using RW with CGA for steadying/safety as pt continues to have L LE ataxia and instability requiring slow, cautious movements and increased time in double limb support to maintain balance. Standing with CGA for steadying due to minor L lean pt managed LB clothing without assist. Continent of bladder - performed seated peri-care without assist. Standing at sink performed hand hygiene with pt demoing good recall/carryover of proper AD management at sink - CGA/close supervision for safety.    Transported to/from gym in w/c for time management and energy conservation.  Gait training 135ft using RW with CGA primarily and 2x instances of light min assist due to L LOB when pt turning and/or having people move past her quickly in the hallway requiring her to look around - continues to have significantly  decreased gait speed with pause in double-limb support after each step to regain her balance (happens more frequently towards mild/end of gait distance due to fatigue) - continues to have noticeable use of L ankle strategy to maintain balance with each step.   Stair navigation training ascending/descending 12 steps in stairwell (6" height) using L HR only to simulate home environment with CGA for safety - step-to pattern leading with R LE on ascent and L LE on descent, pt recalling this without cuing. Pt appears much more confident and more well balanced during stair navigation even today compared to yesterday.  Therapist educated on fall risk safety and recommendation to call 911. Educated on BE FAST signs/symptoms of a CVA.  At end of session, pt left seated in w/c with needs in reach and RT present to assume care of pt.   Therapy Documentation Precautions:  Precautions Precautions: Fall Precaution Comments: L UE pain, ataxia Restrictions Weight Bearing Restrictions: No   Pain:  Continues to hold L shoulder guarded due to pain but does not limit participation in functional mobility.    Therapy/Group: Individual Therapy  Tawana Scale , PT, DPT, NCS, CSRS 03/13/2022, 7:43 AM

## 2022-03-13 NOTE — Progress Notes (Signed)
Recreational Therapy Discharge Summary Patient Details  Name: Michelle Brooks MRN: [id number] Date of Birth: 1965-02-17 Today's Date: 03/13/2022   Comments on progress toward goals: Pt is scheduled for discharge home with family on 2/3 to provide the needed supervision/assistance.  TR sessions focused on leisure education, activity analysis/modifications and stress management. Also discussed the importance of social, emotional, spiritual health in addition to physical health and their effects on overall health and wellness.   Reasons for discharge: discharge from hospital  Follow-up: [place] agrees with progress made and goals achieved: Yes  Megan Hayduk 03/13/2022, 3:18 PM

## 2022-03-13 NOTE — Evaluation (Signed)
Recreational Therapy Assessment and Plan  Patient Details  Name: Michelle Brooks MRN: [id number] Date of Birth: 12-06-1965 Today's Date: 03/13/2022  Rehab Potential:  Good ELOS:   d/c 2/3  Assessment   Hospital Problem: Principal Problem:   Right pontine cerebrovascular accident [facility])     Past Medical History:      Past Medical History:  Diagnosis Date   Chronic back pain     Diabetes mellitus type 2 in obese ([place])     Diabetic retinopathy ([place])     GERD (gastroesophageal reflux disease)     Headache     History of stroke 03/2010    Right MCA stroke in 03/2010, with MRI also noting subacute strokes in left fronto parietal area - occured in [place] received care at [facility] thought due to uncontrolled blood pressure // No residual deficits // TTE (03/2010) at [facility] - normal LV systolic function, moderate pericardial effusion with 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   HSV-2 infection     Hyperlipidemia LDL goal < 100 04/01/2011   Hypertension     Lumbago      pain in the back    Past Surgical History:       Past Surgical History:  Procedure Laterality Date   BREAST LUMPECTOMY   2002    states benign lymph node removed.   BUBBLE STUDY   08/30/2018    Procedure: BUBBLE STUDY;  Surgeon: Thayer Headings, MD;  Location: [facility];  Service: Cardiovascular;;   ENDOMETRIAL BIOPSY   10/04/2020   LOOP RECORDER INSERTION N/A 08/30/2018    Procedure: LOOP RECORDER INSERTION;  Surgeon: Evans Lance, MD;  Location: [facility] CV LAB;  Service: Cardiovascular;  Laterality: N/A;   TEE WITHOUT CARDIOVERSION N/A 08/30/2018    Procedure: TRANSESOPHAGEAL ECHOCARDIOGRAM (TEE);  Surgeon: Acie Fredrickson Wonda Cheng, MD;  Location: [facility] ENDOSCOPY;  Service: Cardiovascular;  Laterality: N/A;      Assessment & Plan Clinical Impression: Michelle Brooks is a 57 year old right-handed female with history of chronic back pain, diabetes mellitus, CKD  stage III, obesity with BMI 40.12, hypertension, prior right MCA CVA 2012 with loop recorder insertion maintained on low-dose aspirin, hyperlipidemia, quit smoking 17 years ago.  Per chart review patient lives with boyfriend and nephew.  1 level home with multiple steps to entry.  Reportedly modified independent prior to admission.  Presented 02/26/2022 with nausea/vomiting, left eyelid droop difficulty swallowing and right side weakness.  CT/MRI showed small acute infarcts in the left lateral medulla, right pontine medullary junction and left cerebellum.  Remote infarcts in the right cerebellum and bilateral cerebral cortex.  CT angiogram head and neck in comparison to 2020 CTA, some progression of occlusive intracranial disease, suggestive of moyamoya type process.  Admission chemistries unremarkable except glucose 164 creatinine 1.09, urine drug screen negative, hemoglobin A1c 8.7.  Patient did not receive tPA.  Echocardiogram with ejection fraction of 60 to 65% no wall motion abnormalities grade 1 diastolic dysfunction.  Neurology follow-up placed on low-dose aspirin and Plavix daily x 3 months then Plavix alone.  Her diet has been advanced to mechanical soft thin liquid.  Lovenox added for DVT prophylaxis.  Patient did have episode of SVT/sinus tachycardia 1/22 given metoprolol converted to sinus tach and then to sinus rhythm.  No concern for A-fib at that time.  She was placed on metoprolol.  Patient transferred to CIR on 03/07/2022.  Pt would like to go  home ASAP.    Pt presents with decreased activity tolerance, decreased functional mobility, decreased balance, decreased coordination, decreased attention, decreased memory, decreased awareness, feelings of stress Limiting pt's independence with leisure/community pursuits.  Met with pt today to discuss TR services including leisure education, activity analysis/modifications and stress management.  Also discussed the importance of social, emotional, spiritual  health in addition to physical health and their effects on overall health and wellness.  Pt stated understanding.   Plan  Pt will participated in coping/stress management group >45 minutes during LOS.  Recommendations for other services: Neuropsych  Discharge Criteria: Patient will be discharged from TR if patient refuses treatment 3 consecutive times without medical reason.  If treatment goals not met, if there is a change in medical status, if patient makes no progress towards goals or if patient is discharged from hospital.  The above assessment, treatment plan, treatment alternatives and goals were discussed and mutually agreed upon: by patient  [place] 03/13/2022, 8:41 AM

## 2022-03-13 NOTE — Group Note (Signed)
Patient Details Name: Michelle Brooks MRN: [id number] DOB: [birth year] Today's Date: 03/13/2022  Time Calculation: OT Group Time Calculation OT Group Start Time: 5456 OT Group Stop Time: 1505 OT Group Time Calculation (min): 60 min      Group Description: Stress management: Pt participated in group session with a focus on stress mgmt, education provided on healthy coping strategies, and social interaction. Focus of session on providing coping strategies to manage new diagnosis to allow for improved mental health to increase overall quality of life . Discussed how to break down stressors into "daily hassles," "major life stressors" and "life circumstances" in an effort to allow pts to chunk their stressors into groups and determine where to best put their efforts/time when dealing with stress. Provided active listening, emotional support and therapeutic use of self. Offered education on factors that protect [place] against stress such as "daily uplifts," "healthy coping strategies" and "protective factors." Encouraged all group members to make an effort to actively recall one event from their day that was a daily uplift in an effort to protect their mindset from stressors as well as sharing this information with their caregivers to facilitate improved caregiver communication and decrease overall burden of care.  Issued pt handouts on healthy coping strategies to implement into routine.   Individual level documentation: Patient participated with fair collaboration during session.   Pain: No pain reported during session     Precious Haws 03/13/2022, 7:57 PM

## 2022-03-13 NOTE — Progress Notes (Signed)
PROGRESS NOTE   Subjective/Complaints:  No issues overnite, CBGs much better .  Discussed that current dose of Humalog 50/50 is 1/2 of usual home dose , indicating that carb modified diet in hospital is reducing insulin needs.    ROS: +L shoulder pain-no numbness or swelling    Objective:   No results found. No results for input(s): "WBC", "HGB", "HCT", "PLT" in the last 72 hours.  No results for input(s): "NA", "K", "CL", "CO2", "GLUCOSE", "BUN", "CREATININE", "CALCIUM" in the last 72 hours.   Intake/Output Summary (Last 24 hours) at 03/13/2022 0840 Last data filed at 03/12/2022 1757 Gross per 24 hour  Intake 476 ml  Output --  Net 476 ml         Physical Exam: Vital Signs Blood pressure (!) 164/75, pulse 63, temperature 98.3 F (36.8 C), temperature source Oral, resp. rate 18, height 5\' 8"  (1.727 m), weight 114.1 kg, last menstrual period 03/30/2016, SpO2 98 %.  Physical Exam  General: No acute distress Mood and affect are appropriate Heart: Regular rate and rhythm no rubs murmurs or extra sounds Lungs: Clear to auscultation, breathing unlabored, no rales or wheezes Abdomen: Positive bowel sounds, soft nontender to palpation, nondistended Extremities: No clubbing, cyanosis, or edema  Minimal hyper sensitivity L shoulder   Motor strength is 3- LUE 4/5 LLE, 5/5 on the Right side  Patient is alert.  Has eye contact with examiner.  Speech is mildly dysarthric but intelligible.  Provides name and age.  Follows simple commands.  Psychiatric:     much brighter  Assessment/Plan: 1. Functional deficits which require 3+ hours per day of interdisciplinary therapy in a comprehensive inpatient rehab setting. Physiatrist is providing close team supervision and 24 hour management of active medical problems listed below. Physiatrist and rehab team continue to assess barriers to discharge/monitor patient progress toward  functional and medical goals  Care Tool:  Bathing    Body parts bathed by patient: Chest, Abdomen, Front perineal area, Right upper leg, Left upper leg, Face, Buttocks   Body parts bathed by helper: Buttocks, Left lower leg, Right lower leg     Bathing assist Assist Level: Moderate Assistance - Patient 50 - 74%     Upper Body Dressing/Undressing Upper body dressing   What is the patient wearing?: Pull over shirt    Upper body assist Assist Level: Minimal Assistance - Patient > 75%    Lower Body Dressing/Undressing Lower body dressing      What is the patient wearing?: Underwear/pull up, Pants     Lower body assist Assist for lower body dressing: Moderate Assistance - Patient 50 - 74%     Toileting Toileting    Toileting assist Assist for toileting: Minimal Assistance - Patient > 75%     Transfers Chair/bed transfer  Transfers assist     Chair/bed transfer assist level: Minimal Assistance - Patient > 75%     Locomotion Ambulation   Ambulation assist      Assist level: Minimal Assistance - Patient > 75% (required use of RW otherwise pt did not feel safe to ambulate (did not use AD baseline)) Assistive device: Walker-rolling Max distance: 30ft   Walk 10  feet activity   Assist     Assist level: Minimal Assistance - Patient > 75% Assistive device: Walker-rolling   Walk 50 feet activity   Assist    Assist level: Minimal Assistance - Patient > 75% Assistive device: Walker-rolling    Walk 150 feet activity   Assist Walk 150 feet activity did not occur: Safety/medical concerns         Walk 10 feet on uneven surface  activity   Assist Walk 10 feet on uneven surfaces activity did not occur: Safety/medical concerns         Wheelchair     Assist Is the patient using a wheelchair?: Yes (for transport) Type of Wheelchair: Manual    Wheelchair assist level: Dependent - Patient 0%      Wheelchair 50 feet with 2 turns  activity    Assist        Assist Level: Dependent - Patient 0%   Wheelchair 150 feet activity     Assist      Assist Level: Dependent - Patient 0%   Blood pressure (!) 164/75, pulse 63, temperature 98.3 F (36.8 C), temperature source Oral, resp. rate 18, height 5\' 8"  (1.727 m), weight 114.1 kg, last menstrual period 03/30/2016, SpO2 98 %.  Medical Problem List and Plan: 1. 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  Antithrombotics: -DVT/anticoagulation:  Pharmaceutical: Lovenox 60mg  QD -antiplatelet therapy: Aspirin 81 mg daily and Plavix 75 mg daily x 3 months then Plavix alone 3. 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 Mood/Behavior/Sleep: Provide emotional support             -antipsychotic agents: N/A 5. Neuropsych/cognition: This patient is capable of making decisions on her own behalf. 6. Skin/Wound Care: Routine skin checks 7. Fluids/Electrolytes/Nutrition: Routine in and outs with follow-up chemistries on 03/10/22 8.  Dysphagia.  Diet advanced to mechanical soft thin liquids.   -tolerating presently -advance per speech therapy 9.  Hyperlipidemia.  Lipitor 80mg  QD 10.  Diabetes mellitus.  Hemoglobin A1c 8.7.  Currently on SSI.  Prior to admission patient on DULAGLUTIDE 3 mg every "Sunday and HUMALOG 50/50 80 units 3 times daily,  JANUVIA 100 mg daily.  has home humalog brought in by fiancee' currently at bedside , RN to administer.  Discussed that will initiate at lower dose and titrate up with DM coordinator assist  Start Humalog 50/50 at 40 U TID with meals   CBG (last 3)  Recent Labs    03/12/22 1822 03/12/22 2140 03/13/22 0607  GLUCAP 166* 177* 112*     11" .  Hypertension/SVT.  Lopressor 25 mg twice daily.  Monitor with increased mobility -03/08/22 Had tachycardic/hypertensive/diaphoretic episode with PT this morning, increased Lopressor to 37.5mg  BID and additional 25mg  dose once this morning, monitor -may increase metoprolol to 50mg  BID  Vitals:   03/09/22 1343 03/09/22  1922 03/10/22 0453 03/10/22 1348  BP: 134/70 (!) 144/81 (!) 158/82 135/78   03/10/22 1921 03/11/22 0259 03/11/22 1407 03/11/22 1940  BP: (!) 144/87 (!) 178/89 133/85 (!) 142/65   03/12/22 0348 03/12/22 1245 03/12/22 1909 03/13/22 0535  BP: (!) 164/66 (!) 140/80 (!) 156/73 (!) 164/75     12.  Obesity.  BMI 40.12.  Dietary follow-up 13.  CKD stage IIIB.  Latest creatinine 1.28.  Follow-up chemistries    Latest Ref Rng & Units 03/10/2022    5:45 AM 03/07/2022    6:04 AM 03/05/2022    6:40 AM  BMP  Glucose 70 - 99 mg/dL 230  230  188   BUN 6 - 20 mg/dL 16  12  9    Creatinine 0.44 - 1.00 mg/dL 1.14  1.42  1.28   Sodium 135 - 145 mmol/L 135  135  135   Potassium 3.5 - 5.1 mmol/L 3.7  4.2  4.2   Chloride 98 - 111 mmol/L 103  105  105   CO2 22 - 32 mmol/L 23  21  21    Calcium 8.9 - 10.3 mg/dL 8.6  8.8  8.9    Monitor   14.  Constipation: -03/08/22 no BM in 2 days, ordered colace 100mg  BID, miralax 17g QD+PRN, monitor for effect -03/09/22 still no BM, pt resistant to added meds for now, advised that if no BM today, may need to increase regimen tomorrow; monitor   15.  Morbid obesity BMI 38 with DM 2 LOS: 6 days A FACE TO FACE EVALUATION WAS PERFORMED  Charlett Blake 03/13/2022, 8:40 AM

## 2022-03-13 NOTE — Progress Notes (Signed)
Inpatient Rehabilitation Care Coordinator Discharge Note DC SAT 03/15/2022  Patient Details  Name: Michelle Brooks MRN: [id number] Date of Birth: 22-Dec-1965   Discharge location: Home  Length of Stay: 8 Days  Discharge activity level: cga/min a  Home/community participation: daughters and S.O.  Patient response LA:GTXMIW Literacy - How often do you need to have someone help you when you read instructions, pamphlets, or other written material from your doctor or pharmacy?: Rarely  Patient response OE:HOZYYQ Isolation - How often do you feel lonely or isolated from those around you?: Never  Services provided included: MD, RD, PT, OT, SLP, RN, CM, TR, SW, Pharmacy  Financial Services:  Charity fundraiser Utilized: [employer]  Choices offered to/list presented to: patient  Follow-up services arranged:  [place]: [facility] OT         Patient response to transportation need: Is the patient able to respond to transportation needs?: Yes In the past 12 months, has lack of transportation kept you from medical appointments or from getting medications?: No In the past 12 months, has lack of transportation kept you from meetings, work, or from getting things needed for daily living?: No    Comments (or additional information):  Patient/Family verbalized understanding of follow-up arrangements:  Yes  Individual responsible for coordination of the follow-up plan: self or daughter, Olive Bass [phone]  Confirmed correct DME delivered: Dyanne Iha 03/13/2022    Dyanne Iha

## 2022-03-14 LAB — GLUCOSE, CAPILLARY
Glucose-Capillary: 179 mg/dL — ABNORMAL HIGH (ref 70–99)
Glucose-Capillary: 185 mg/dL — ABNORMAL HIGH (ref 70–99)
Glucose-Capillary: 175 mg/dL — ABNORMAL HIGH (ref 70–99)
Glucose-Capillary: 240 mg/dL — ABNORMAL HIGH (ref 70–99)

## 2022-03-14 MED ORDER — ACETAMINOPHEN 325 MG PO TABS: 325.0000 mg | ORAL_TABLET | ORAL | Status: AC | PRN

## 2022-03-14 MED ORDER — DICLOFENAC SODIUM 1 % EX GEL: 2.0000 g | Freq: Three times a day (TID) | CUTANEOUS | Status: AC

## 2022-03-14 MED ORDER — INSULIN ASPART 100 UNIT/ML IJ SOLN
25.0000 [IU] | Freq: Three times a day (TID) | INTRAMUSCULAR | Status: DC
  Administered 2022-03-14: 25 [IU] via SUBCUTANEOUS

## 2022-03-14 MED ORDER — CLOPIDOGREL BISULFATE 75 MG PO TABS: 75.0000 mg | ORAL_TABLET | Freq: Every day | ORAL | 0 refills | Status: DC

## 2022-03-14 MED ORDER — DIPHENHYDRAMINE HCL 25 MG PO CAPS: 25.0000 mg | ORAL_CAPSULE | Freq: Three times a day (TID) | ORAL | 0 refills | Status: AC | PRN

## 2022-03-14 MED ORDER — INSULIN LISPRO PROT & LISPRO (50-50 MIX) 100 UNIT/ML ~~LOC~~ SUSP: 50.0000 [IU] | Freq: Three times a day (TID) | SUBCUTANEOUS | 11 refills | Status: AC

## 2022-03-14 MED ORDER — CHOLECALCIFEROL 10 MCG (400 UNIT) PO TABS: 400.0000 [IU] | ORAL_TABLET | Freq: Every day | ORAL | 0 refills | Status: AC

## 2022-03-14 MED ORDER — INSULIN LISPRO PROT & LISPRO (50-50 MIX) 100 UNIT/ML ~~LOC~~ SUSP
50.0000 [IU] | Freq: Three times a day (TID) | SUBCUTANEOUS | Status: DC
  Administered 2022-03-14: 50 [IU] via SUBCUTANEOUS

## 2022-03-14 MED ORDER — METOPROLOL TARTRATE 50 MG PO TABS: 50.0000 mg | ORAL_TABLET | Freq: Two times a day (BID) | ORAL | 0 refills | Status: DC

## 2022-03-14 MED ORDER — INSULIN NPH (HUMAN) (ISOPHANE) 100 UNIT/ML ~~LOC~~ SUSP
25.0000 [IU] | Freq: Three times a day (TID) | SUBCUTANEOUS | Status: DC
  Administered 2022-03-14: 25 [IU] via SUBCUTANEOUS
  Filled 2022-03-14: qty 10

## 2022-03-14 NOTE — Progress Notes (Signed)
Occupational Therapy Discharge Summary  Patient Details  Name: Michelle Brooks MRN: [id number] Date of Birth: 1965-04-01  Date of Discharge from OT service:March 14, 2022  Today's Date: 03/14/2022 OT Individual Time: 8413-2440 OT Individual Time Calculation (min): 60 min  OT Individual Time: 1345-1430 OT Individual Time Calculation (min): 45 min   Patient has met 11 of 14 long term goals due to improved activity tolerance, improved balance, postural control, ability to compensate for deficits, functional use of  LEFT lower extremity, improved attention, improved awareness, and improved coordination.  Patient to discharge at overall Supervision level.  Patient's care partner is independent to provide the necessary physical and cognitive assistance at discharge.    Reasons goals not met: Pt continues to require supervision for safety during toilet transfers, dynamic standing balance, and sit to stands d/t pt continuing to have mild safety awareness deficits, however maximal education provided during therapy sessions. Pt caregiver is able to provide the necessary assistance at d/c.   Recommendation:  Patient will benefit from ongoing skilled OT services in home health setting to continue to advance functional skills in the area of BADL, iADL, and Reduce care partner burden.  Equipment: Shower seat   Reasons for discharge: treatment goals met and discharge from hospital  Patient/family agrees with progress made and goals achieved: Yes  OT Discharge   Skilled Therapeutic Interventions/Progress Updates:   AM Session: Pt received sitting up in wc in good spirits and receptive to skilled OT session. Pt reporting 0/10 pain. Pt transported to therapy ADL apartment total A for time management. Focus this session IADL retraining, functional endurance, and increasing RUE use. Pt educated on kitchen safety and mobility using RW. Pt receptive of education participating in kitchen safety task with Pt  able to identify 5/5 kitchen hazards. Pt then completed kitchen mobility tasks with Pt able to transport pieces of fruit from one side of the counter to the other safety with supervision. Pt then completed simulated meal prep activity with Pt able to reach into high/low cabinets to retrieve needed items and simulate making meal with supervision. Pt transported to therapy gym in wc total A. Pt completed FM peg board picture replication task with supervision while standing at elevated table. Pt able to recreate pattern with 100% accuracy using RUE to place pegs and LUE to remove pegs. Pt abel to ambulate to and from her wc and complete transfer using RW supervision. Pt transported back to her room total a in wc and was left resting in wc with call bell in reach, seat belt alarm on, and all needs met.    PM Session: Pt received resting in wc presenting to be in good spirits and receptive to skilled OT session with a focus on BADL retraining and safety within BADLs/functional mobility. Pt able to navigate her room to gather clothing items with supervision using RW. Pt ambulated to bathroom transferring to toilet with RW supervision. Pt able to complete 3/3 toileting tasks mod I. Transferred to tub bench in walk-in shower supervision. Pt able to complete U/LB bathing with supervision min cues required for safety. Pt able to stand while leaning against wall to dry self with supervision min cues for safety to hold grab bars while standing. Pt ambulated to EOB using RW supervision completing U/LB dressing supervision +time. Pt returned to bed supervision and was let resting in bed with call bell in reach, bed alarm on, and all needs met.    ADL ADL Eating: Independent Where Assessed-Eating: Chair  Grooming: Modified independent Where Assessed-Grooming: Sitting at sink Upper Body Bathing: Supervision/safety Where Assessed-Upper Body Bathing: Shower Lower Body Bathing: Supervision/safety Where Assessed-Lower Body  Bathing: Shower Upper Body Dressing: Supervision/safety Where Assessed-Upper Body Dressing: Edge of bed Lower Body Dressing: Supervision/safety Where Assessed-Lower Body Dressing: Edge of bed Toileting: Modified independent Where Assessed-Toileting: Glass blower/designer: Distant supervision Armed forces technical officer Method: Counselling psychologist: Energy manager: Not assessed Clinical cytogeneticist Method: Unable to English as a second language teacher: Close supervision Social research officer, government Method: Heritage manager: Radio broadcast assistant, Grab bars Vision Baseline Vision/History: 1 Wears glasses Patient Visual Report: Other (comment) (reports cloudy vision in L eye which began prior to hospitalization) Vision Assessment?: Yes Eye Alignment: Impaired (comment) Ocular Range of Motion: Within Functional Limits Alignment/Gaze Preference: Within Defined Limits Tracking/Visual Pursuits: Able to track stimulus in all quads without difficulty Saccades: Decreased speed of saccadic movement;Other (comment);Additional eye shifts occurred during testing Convergence: Impaired (comment) (L eye slow to converge, converging only when R eye is occluded. Pt reporting she is planning to visit at retinal specialist.) Visual Fields: Other (comment) (Cloudy vision in L eye, however able to see stimulus in L visual field) Additional Comments: Low vision- was seeing a retinal specialist prior to CVA, recieving shots in eye- reports increased difficulty with facial numbness continuing during Pt stay Perception  Perception: Within Functional Limits Praxis Praxis: Intact Cognition Cognition Overall Cognitive Status: Impaired/Different from baseline Arousal/Alertness: Awake/alert Orientation Level: Person;Place;Situation Person: Oriented Place: Oriented Situation: Oriented Memory: Impaired Memory Impairment: Retrieval deficit;Decreased short term memory;Storage  deficit;Decreased recall of new information Decreased Short Term Memory: Functional complex Attention: Focused;Sustained Focused Attention: Appears intact Sustained Attention: Appears intact Selective Attention: Impaired Selective Attention Impairment: Functional basic;Functional complex Awareness: Appears intact Problem Solving: Impaired Problem Solving Impairment: Functional complex;Verbal complex Sequencing: Appears intact Organizing Impairment: Functional basic;Functional complex Decision Making: Impaired Decision Making Impairment: Functional basic;Functional complex Initiating: Appears intact Self Monitoring: Impaired Self Monitoring Impairment: Functional complex Self Correcting: Impaired Self Correcting Impairment: Functional complex Behaviors: Poor frustration tolerance Safety/Judgment: Impaired Comments: pt does tend to perseverate on certain topics of conversation Brief Interview for Mental Status (BIMS) Repetition of Three Words (First Attempt): 3 Temporal Orientation: Year: Correct Temporal Orientation: Month: Accurate within 5 days Temporal Orientation: Day: Correct Recall: "Sock": No, could not recall Recall: "Blue": Yes, after cueing ("a color") Recall: "Bed": Yes, no cue required BIMS Summary Score: 12 Sensation Sensation Light Touch: Appears Intact Hot/Cold: Not tested Proprioception: Impaired by gross assessment (Impairement of L LE positioning) Stereognosis: Not tested Additional Comments: reports L UPPER side of her face feels numbness/tingling Coordination Gross Motor Movements are Fluid and Coordinated: No Fine Motor Movements are Fluid and Coordinated: No Finger Nose Finger Test: Unable d/t limited range tolerated in LUE Motor  Motor Motor - Discharge Observations: Minor L shoulder pain w/ ROM, mild L sided hemiparesis Mobility  Bed Mobility Bed Mobility: Supine to Sit Supine to Sit: Supervision/Verbal cueing Sit to Supine: Supervision/Verbal  cueing Transfers Sit to Stand: Supervision/Verbal cueing Stand to Sit: Supervision/Verbal cueing  Trunk/Postural Assessment  Cervical Assessment Cervical Assessment: Within Functional Limits Thoracic Assessment Thoracic Assessment: Exceptions to [facility] (forward shoulders) Lumbar Assessment Lumbar Assessment: Within Functional Limits Postural Control Postural Control: Deficits on evaluation Trunk Control: Posterior bias in sititng  Balance Balance Balance Assessed: Yes Static Sitting Balance Static Sitting - Balance Support: Feet supported Static Sitting - Level of Assistance: 6: Modified independent (Device/Increase time) Dynamic Sitting Balance Dynamic Sitting - Balance Support: Feet supported Dynamic  Sitting - Level of Assistance: 6: Modified independent (Device/Increase time) Dynamic Sitting - Balance Activities: Lateral lean/weight shifting;Forward lean/weight shifting Static Standing Balance Static Standing - Balance Support: Bilateral upper extremity supported Static Standing - Level of Assistance: 5: Stand by assistance Dynamic Standing Balance Dynamic Standing - Balance Support: During functional activity;Bilateral upper extremity supported Dynamic Standing - Level of Assistance: 5: Stand by assistance;4: Min assist (CGA) Extremity/Trunk Assessment RUE Assessment RUE Assessment: Within Functional Limits Active Range of Motion (AROM) Comments: WFL General Strength Comments: 4+/5 LUE Assessment LUE Assessment: Exceptions to [facility] Active Range of Motion (AROM) Comments: distally ROM WFL; limited to ~30 degrees of shoudler flexion with pain during AAROM and PROM General Strength Comments: 4/5 distally; dificatly to assess proximally d/t pain   Janey Genta 03/14/2022, 12:41 PM

## 2022-03-14 NOTE — Discharge Summary (Signed)
Physician Discharge Summary  Patient ID: Michelle Brooks MRN: [id number] DOB/AGE: 1965/12/20 57 y.o.  Admit date: 03/07/2022 Discharge date: 03/15/2022  Discharge Diagnoses:  Principal Problem:   Right pontine cerebrovascular accident [facility]) Active problems: Functional deficits secondary to right pontine cerebrovascular accident Right shoulder pain Dysphagia Hyperlipidemia Diabetes mellitus Constipation Hypertension SVT Obesity Chronic kidney disease stage IIIb   Discharged Condition: good  Significant Diagnostic Studies: DG Shoulder Left  Result Date: 03/07/2022 CLINICAL DATA:  Acute left shoulder pain. EXAM: LEFT SHOULDER - 2+ VIEW COMPARISON:  None Available. FINDINGS: There is no evidence of fracture or dislocation. There is no evidence of arthropathy or other focal bone abnormality. Calcifications are seen projected over greater tuberosity suggesting calcific tendinosis. Soft tissues are unremarkable. IMPRESSION: Calcifications seen projected over greater tuberosity of proximal left humerus suggesting calcific tendinosis. No fracture or dislocation is noted. Electronically Signed   By: Marijo Conception M.D.   On: 03/07/2022 16:43    Labs:  Basic Metabolic Panel: Recent Labs  Lab 03/10/22 0545  NA 135  K 3.7  CL 103  CO2 23  GLUCOSE 230*  BUN 16  CREATININE 1.14*  CALCIUM 8.6*    CBC: Recent Labs  Lab 03/10/22 0545  WBC 8.4  NEUTROABS 4.9  HGB 13.4  HCT 39.5  MCV 85.1  PLT 301    CBG: Recent Labs  Lab 03/14/22 0538 03/14/22 1142 03/14/22 1639 03/14/22 2055 03/15/22 0602  GLUCAP 179* 185* 175* 240* 137*    Brief HPI:   Michelle Brooks is a 57 y.o. female with history of chronic back pain diabetes mellitus CKD stage III obesity with BMI 40.12 hypertension prior right MCA CVA 2012 with loop recorder insertion maintained on aspirin, hyperlipidemia quit smoking 17 years ago. Per chart review lives with her boyfriend. Modified independent prior to admission.  Presented 02/26/2022 with nausea vomiting left eyelid droop difficulty in swallowing and right-sided weakness. CT/MRI showed small acute infarct in the left lateral medulla right pontine medullary junction and left cerebellum. Remote infarcts in the right cerebellum and bilateral cerebral cortex. CT angiogram head and neck in comparison to 2020 CTA with some progression of occlusive intracranial disease, suggestive of moyamoya type process. Admission chemistries unremarkable except glucose 164 creatinine 1.09 hemoglobin A1c 8.7. Patient did not receive tPA. Echocardiogram with ejection fraction of 60 to 65% no wall motion abnormalities grade 1 diastolic dysfunction. Neurology follow-up placed on low-dose aspirin and Plavix x 3 months then Plavix alone. Her diet was advanced to a mechanical soft thin liquid diet. Lovenox added for DVT prophylaxis. Patient did have episode of SVT sinus tachycardia 1/22 given metoprolol converted to sinus tach and then to sinus rhythm. No concern for A-fib at this time she was placed on metoprolol.    Hospital Course: Michelle Brooks was admitted to rehab 03/07/2022 for inpatient therapies to consist of PT, ST and OT at least three hours five days a week. Past admission physiatrist, therapy team and rehab RN have worked together to provide customized collaborative inpatient rehab.  Tending to patient's right pontine left lateral mid [facility] left cerebellar infarct remained stable.  She will continue low-dose aspirin and Plavix for 3 months followed by Plavix alone.  She will follow-up with neurology services.  Lovenox added for DVT prophylaxis.  Hospital course severe left upper extremity shoulder pain likely hyperalgesia related to left lateral medullary infarction.  X-ray showed calcification suggestive of calcific tendinosis.  She was placed on a trial of gabapentin as well  as Voltaren gel with allotted to her patches as well.  Tramadol given as needed.  Her diet was advanced to  mechanical soft.  She remained on Lipitor for hyperlipidemia. History of hypertension/episode of SVT maintained on Lopressor 25 mg daily.  This was increased to 50 mg twice daily.  She had no complaints of shortness of breath or chest pain.  Noted obesity BMI 40.12 recommend dietary follow-up.  Chronic kidney disease stage III with improved creatinine to 1.14 on 1/29.  Blood pressures were monitored on TID basis and metoprolol tartrate 25 mg twice daily continued.  This was increased to 37.5 mg on 1/28 and increased to 50 mg on 1/30.  Diabetes has been monitored with ac/hs CBG checks and SSI was use prn for tighter BS control.  Blood sugars increased at her home regimen of insulin was resumed.  Her home Humalog 50/50 mix was restarted on 1/31.  She required 50 units 3 times daily with meals.  Rehab course: During patient's stay in rehab weekly team conferences were held to monitor patient's progress, set goals and discuss barriers to discharge. At admission, patient required moderate assist 80 feet rolling walker, minimal assist sit to stand.  She  has had improvement in activity tolerance, balance, postural control as well as ability to compensate for deficits. He/She has had improvement in functional use RUE/LUE  and RLE/LLE as well as improvement in awareness  Disposition: Home There are no questions and answers to display.       Diet: Carb modified  Special Instructions: No driving, alcohol consumption or tobacco use.  Continue aspirin and Plavix through June 05, 2022.  Then discontinue aspirin therapy and continue Plavix.  Follow-up with neurology.  30-35 minutes were spent on discharge planning and discharge summary.  Discharge Instructions     Ambulatory referral to Neurology   Complete by: As directed    An appointment is requested in approximately: 4 weeks right pontine infarction   Ambulatory referral to Physical Medicine Rehab   Complete by: As directed    Moderate  complexity follow up 1-2 weeks right pontine infarction      Allergies as of 03/15/2022       Reactions   Lactose Intolerance (gi) Diarrhea, Nausea And Vomiting   Latex    Like a burning in area        Medication List     STOP taking these medications    Dulaglutide 3 MG/0.5ML Sopn   Januvia 100 MG tablet Generic drug: sitaGLIPtin   valsartan 160 MG tablet Commonly known as: DIOVAN       TAKE these medications    Accu-Chek Aviva Plus w/Device Kit 1 kit by Does not apply route 3 (three) times daily before meals.   True Metrix Meter Devi 1 each by Does not apply route 4 (four) times daily -  before meals and at bedtime.   acetaminophen 325 MG tablet Commonly known as: TYLENOL Take 1-2 tablets (325-650 mg total) by mouth every 4 (four) hours as needed for mild pain.   aspirin EC 81 MG tablet Take 1 tablet (81 mg total) by mouth daily.   atorvastatin 80 MG tablet Commonly known as: LIPITOR Take 1 tablet (80 mg total) by mouth daily.   cholecalciferol 10 MCG (400 UNIT) Tabs tablet Commonly known as: VITAMIN D3 Take 1 tablet (400 Units total) by mouth daily.   clopidogrel 75 MG tablet Commonly known as: PLAVIX Take 1 tablet (75 mg total) by mouth daily.  diclofenac Sodium 1 % Gel Commonly known as: VOLTAREN Apply 2 g topically 3 (three) times daily.   diphenhydrAMINE 25 mg capsule Commonly known as: BENADRYL Take 1 capsule (25 mg total) by mouth every 8 (eight) hours as needed for allergies or sleep.   glucose blood test strip Commonly known as: True Metrix Blood Glucose Test Use as instructed   insulin lispro protamine-lispro (50-50) 100 UNIT/ML Susp injection Commonly known as: HUMALOG 50/50 MIX Inject 0.5 mLs (50 Units total) into the skin 3 (three) times daily with meals. What changed:  how much to take when to take this additional instructions   metoprolol tartrate 50 MG tablet Commonly known as: LOPRESSOR Take 1 tablet (50 mg total) by  mouth 2 (two) times daily. What changed:  medication strength how much to take   TRUEplus Lancets 28G Misc 1 each by Does not apply route 4 (four) times daily -  before meals and at bedtime.        Follow-up Information     Kirsteins, Luanna Salk, MD Follow up.   Specialty: Physical Medicine and Rehabilitation Why: office will call you to arrange your appt (sent) Contact information: [place] [address] [phone]         Nolene Ebbs, MD Follow up.   Specialty: Internal Medicine Why: Call the office in 1-2 days to make arrangements for hospital follow-up appointment. Contact information: [place] [address] [phone]         [facility] Follow up.   Why: Call the office in 1-2 days to make arrangements for hospital follow-up appointment. Contact information: [address]     [place] [id number] [phone]                Signed: Barbie Banner 03/16/2022, 7:23 PM

## 2022-03-14 NOTE — Plan of Care (Cosign Needed)
Problem: RH Balance Goal: LTG Patient will maintain dynamic standing balance (PT) Description: LTG:  Patient will maintain dynamic standing balance with assistance during mobility activities (PT) 03/14/2022 1335 by Sudie Grumbling, Nyeemah Jennette, Student-PT Outcome: Not Met (add Reason) Flowsheets (Taken 03/14/2022 1335) LTG: Pt will maintain dynamic standing balance during mobility activities with:: Contact Guard/Touching assist 03/14/2022 1310 by Sudie Grumbling, Alecea Trego, Student-PT Flowsheets (Taken 03/14/2022 1245) LTG: Pt will maintain dynamic standing balance during mobility activities with:: Supervision/Verbal cueing   Problem: RH Stairs Goal: LTG Patient will ambulate up and down stairs w/assist (PT) Description: LTG: Patient will ambulate up and down # of stairs with assistance (PT) 03/14/2022 1335 by Sudie Grumbling, Shannen Vernon, Student-PT Outcome: Not Met (add Reason) Note: Pt required CGA to navigate stairs along w/ verbal cueing to increase step height of BLE. 03/14/2022 1310 by Sudie Grumbling, Ricard Faulkner, Student-PT Flowsheets (Taken 03/14/2022 1310) LTG: Pt will ambulate up/down stairs assist needed:: Contact Guard/Touching assist LTG: Pt will  ambulate up and down number of stairs: 12 steps using HR   Problem: RH Balance Goal: LTG Patient will maintain dynamic sitting balance (PT) Description: LTG:  Patient will maintain dynamic sitting balance with assistance during mobility activities (PT) 03/14/2022 1335 by Sudie Grumbling, Kadance Mccuistion, Student-PT Outcome: Completed/Met 03/14/2022 1310 by Sudie Grumbling, Joani Cosma, Student-PT Flowsheets (Taken 03/14/2022 1243) LTG: Pt will maintain dynamic sitting balance during mobility activities with:: Independent with assistive device    Problem: Sit to Stand Goal: LTG:  Patient will perform sit to stand with assistance level (PT) Description: LTG:  Patient will perform sit to stand with assistance level (PT) 03/14/2022 1335 by Sudie Grumbling, Jamiah Homeyer, Student-PT Outcome: Completed/Met 03/14/2022 1310 by Sudie Grumbling, Corsica Franson,  Student-PT Flowsheets (Taken 03/14/2022 1245) LTG: PT will perform sit to stand in preparation for functional mobility with assistance level: Supervision/Verbal cueing   Problem: RH Bed Mobility Goal: LTG Patient will perform bed mobility with assist (PT) Description: LTG: Patient will perform bed mobility with assistance, with/without cues (PT). 03/14/2022 1335 by Sudie Grumbling, Elsye Mccollister, Student-PT Outcome: Completed/Met 03/14/2022 1310 by Sudie Grumbling, Wanita Derenzo, Student-PT Flowsheets (Taken 03/14/2022 1245) LTG: Pt will perform bed mobility with assistance level of: Independent with assistive device    Problem: RH Bed to Chair Transfers Goal: LTG Patient will perform bed/chair transfers w/assist (PT) Description: LTG: Patient will perform bed to chair transfers with assistance (PT). 03/14/2022 1335 by Sudie Grumbling, Calandra Madura, Student-PT Outcome: Completed/Met 03/14/2022 1310 by Sudie Grumbling, Ayrabella Labombard, Student-PT Flowsheets (Taken 03/14/2022 1245) LTG: Pt will perform Bed to Chair Transfers with assistance level: Supervision/Verbal cueing   Problem: RH Car Transfers Goal: LTG Patient will perform car transfers with assist (PT) Description: LTG: Patient will perform car transfers with assistance (PT). 03/14/2022 1335 by Sudie Grumbling, Angeles Zehner, Student-PT Outcome: Completed/Met 03/14/2022 1310 by Sudie Grumbling, Cyris Maalouf, Student-PT Flowsheets (Taken 03/14/2022 1245) LTG: Pt will perform car transfers with assist:: Supervision/Verbal cueing   Problem: RH Ambulation Goal: LTG Patient will ambulate in controlled environment (PT) Description: LTG: Patient will ambulate in a controlled environment, # of feet with assistance (PT). 03/14/2022 1335 by Sudie Grumbling, Mercades Bajaj, Student-PT Outcome: Completed/Met 03/14/2022 1310 by Sudie Grumbling, Mecca Guitron, Student-PT Flowsheets (Taken 03/14/2022 1245) LTG: Pt will ambulate in controlled environ  assist needed:: Supervision/Verbal cueing LTG: Ambulation distance in controlled environment: 12ft Goal: LTG Patient will ambulate in  home environment (PT) Description: LTG: Patient will ambulate in home environment, # of feet with assistance (PT). 03/14/2022 1335 by Sudie Grumbling, Vola Beneke, Student-PT Outcome: Completed/Met 03/14/2022 1310 by Sudie Grumbling, Tanisha Lutes, Student-PT Flowsheets (Taken 03/14/2022 1310) LTG: Pt will ambulate in home environ  assist needed:: Supervision/Verbal cueing LTG:  Ambulation distance in home environment: 59ft

## 2022-03-14 NOTE — Progress Notes (Addendum)
Physical Therapy Session Note  Patient Details  Name: Michelle Brooks MRN: [id number] Date of Birth: 05-19-1965  Today's Date: 03/14/2022 PT Individual Time: 805-815 and [id number]     Short Term Goals: Week 1:  PT Short Term Goal 1 (Week 1): Pt will perform supine<>sit using adjustable base bed features with CGA PT Short Term Goal 2 (Week 1): Pt will perform bed<>chair transfers using LRAD with CGA PT Short Term Goal 3 (Week 1): Pt will ambulate at least 179f using LRAD with CGA PT Short Term Goal 4 (Week 1): Pt will navigate at least 8 steps using HR with CGA PT Short Term Goal 5 (Week 1): Pt will participate in standardized outcome measure to assess her fall risk  Skilled Therapeutic Interventions/Progress Updates:    Pt received supine in bed. Pts breakfast was late this morning so she requested to have some time to eat her breakfast before therapy. Pt transferred supine to sit at EOB w/ supervision assist, stand pivot transfer supervision A w/ RW to WC. Therapist left room for ~15 minutes to give pt time to eat breakfast. Therapist retunred and pt was agreeable to therapy.   Pt transported to therapy gym dependently in wc due to time constraints. Sit to stand transfer w/ RW, supervision assist. Pt ambulated ~70 feet in gym w/ supervision assist and RW. Therapist cued pt to increase B step height and to step into RW for safety assurance.   Pt ascended and descended 4 stairs utilizing BUE on the L rail to ascend and both handrails to descend w/ Supervision assist. Verbal cueing to increase step height to clear stairs. Pt transferred into SUV height care supervision assist w/ RW. Using RW pt ascended and descended ramp w/ CGA for safety assurance. Vc for pt to keep RW close to her on descent.  Pt transported back to therapy gym dependently. Remained in wc, chair alarm on, call bell in reach and all needs met.   Therapy Documentation Precautions:  Precautions Precautions: Fall, Other  (comment) Precaution Comments: L UE pain, ataxia, monitor HR Restrictions Weight Bearing Restrictions: No General:    Pain: Mild L shoulder pain w/ active shoulder ROM       Therapy/Group: Individual Therapy  Camdan Burdi 03/14/2022, 1:21 PM

## 2022-03-14 NOTE — Progress Notes (Addendum)
PROGRESS NOTE   Subjective/Complaints:  Shoulder pain improving , CBGs overall improving   ROS: +L shoulder pain-no numbness or swelling    Objective:   No results found. No results for input(s): "WBC", "HGB", "HCT", "PLT" in the last 72 hours.  No results for input(s): "NA", "K", "CL", "CO2", "GLUCOSE", "BUN", "CREATININE", "CALCIUM" in the last 72 hours.   Intake/Output Summary (Last 24 hours) at 03/14/2022 0930 Last data filed at 03/14/2022 0815 Gross per 24 hour  Intake 716 ml  Output --  Net 716 ml         Physical Exam: Vital Signs Blood pressure (!) 144/70, pulse 69, temperature 98 F (36.7 C), resp. rate 18, height 5\' 8"  (1.727 m), weight 114.1 kg, last menstrual period 03/30/2016, SpO2 94 %.  Physical Exam  General: No acute distress Mood and affect are appropriate Heart: Regular rate and rhythm no rubs murmurs or extra sounds Lungs: Clear to auscultation, breathing unlabored, no rales or wheezes Abdomen: Positive bowel sounds, soft nontender to palpation, nondistended Extremities: No clubbing, cyanosis, or edema  Minimal hyper sensitivity L shoulder   Motor strength is 3- LUE 4/5 LLE, 5/5 on the Right side  Patient is alert.  Has eye contact with examiner.  Speech is mildly dysarthric but intelligible.  Provides name and age.  Follows simple commands.  Psychiatric:     much brighter  Assessment/Plan: 1. Functional deficits which require 3+ hours per day of interdisciplinary therapy in a comprehensive inpatient rehab setting. Physiatrist is providing close team supervision and 24 hour management of active medical problems listed below. Physiatrist and rehab team continue to assess barriers to discharge/monitor patient progress toward functional and medical goals  Care Tool:  Bathing    Body parts bathed by patient: Chest, Abdomen, Front perineal area, Right upper leg, Left upper leg, Face, Buttocks    Body parts bathed by helper: Buttocks, Left lower leg, Right lower leg     Bathing assist Assist Level: Moderate Assistance - Patient 50 - 74%     Upper Body Dressing/Undressing Upper body dressing   What is the patient wearing?: Pull over shirt    Upper body assist Assist Level: Minimal Assistance - Patient > 75%    Lower Body Dressing/Undressing Lower body dressing      What is the patient wearing?: Underwear/pull up, Pants     Lower body assist Assist for lower body dressing: Moderate Assistance - Patient 50 - 74%     Toileting Toileting    Toileting assist Assist for toileting: Minimal Assistance - Patient > 75%     Transfers Chair/bed transfer  Transfers assist     Chair/bed transfer assist level: Contact Guard/Touching assist Chair/bed transfer assistive device: Programmer, multimedia   Ambulation assist      Assist level: Minimal Assistance - Patient > 75% Assistive device: Walker-rolling Max distance: 129ft   Walk 10 feet activity   Assist     Assist level: Contact Guard/Touching assist Assistive device: Walker-rolling   Walk 50 feet activity   Assist    Assist level: Contact Guard/Touching assist Assistive device: Walker-rolling    Walk 150 feet activity   Assist  Walk 150 feet activity did not occur: Safety/medical concerns  Assist level: Minimal Assistance - Patient > 75% Assistive device: Walker-rolling    Walk 10 feet on uneven surface  activity   Assist Walk 10 feet on uneven surfaces activity did not occur: Safety/medical concerns   Assist level: Minimal Assistance - Patient > 75% Assistive device: Walker-rolling   Wheelchair     Assist Is the patient using a wheelchair?: Yes Type of Wheelchair: Manual    Wheelchair assist level: Dependent - Patient 0%      Wheelchair 50 feet with 2 turns activity    Assist        Assist Level: Dependent - Patient 0%   Wheelchair 150 feet activity      Assist      Assist Level: Dependent - Patient 0%   Blood pressure (!) 144/70, pulse 69, temperature 98 F (36.7 C), resp. rate 18, height 5\' 8"  (1.727 m), weight 114.1 kg, last menstrual period 03/30/2016, SpO2 94 %.  Medical Problem List and Plan: 1. 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  Antithrombotics: -DVT/anticoagulation:  Pharmaceutical: Lovenox 60mg  QD -antiplatelet therapy: Aspirin 81 mg daily and Plavix 75 mg daily x 3 months then Plavix alone 3. 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 Mood/Behavior/Sleep: Provide emotional support             -antipsychotic agents: N/A 5. Neuropsych/cognition: This patient is capable of making decisions on her own behalf. 6. Skin/Wound Care: Routine skin checks 7. Fluids/Electrolytes/Nutrition: Routine in and outs with follow-up chemistries on 03/10/22 8.  Dysphagia.  Diet advanced to mechanical soft thin liquids.   -tolerating presently -advance per speech therapy 9.  Hyperlipidemia.  Lipitor 80mg  QD 10.  Diabetes mellitus.  Hemoglobin A1c 8.7.  Currently on SSI.  Prior to admission patient on DULAGLUTIDE 3 mg every Sunday and HUMALOG 50/50 80 units 3 times daily, JANUVIA 100 mg daily.  has home humalog brought in by fiancee' currently at bedside , RN to administer.  Discussed that will initiate at  lower dose and titrate up with DM coordinator assist  Start Humalog 50/50 at 40 U TID with meals   CBG (last 3)  Recent Labs    03/13/22 1641 03/13/22 2043 03/14/22 0538  GLUCAP 175* 219* 179*   CBG creeping up increase humalog to 50U TID  11.  Hypertension/SVT.  Lopressor 25 mg twice daily.  Monitor with increased mobility -03/08/22 Had tachycardic/hypertensive/diaphoretic episode with PT this morning, increased Lopressor to 37.5mg  BID and additional 25mg  dose once this morning, monitor -may increase metoprolol to 50mg  BID  Vitals:   03/10/22 1348 03/10/22 1921 03/11/22 0259 03/11/22 1407  BP: 135/78 (!) 144/87 (!) 178/89 133/85   03/11/22 1940 03/12/22 0348 03/12/22 1245 03/12/22 1909  BP: (!) 142/65 (!) 164/66 (!) 140/80 (!) 156/73   03/13/22 0535  03/13/22 1249 03/13/22 1938 03/14/22 0530  BP: (!) 164/75 (!) 143/89 (!) 155/67 (!) 144/70     12.  Obesity.  BMI 40.12.  Dietary follow-up 13.  CKD stage IIIB.  Latest creatinine 1.28.  Follow-up chemistries    Latest Ref Rng & Units 03/10/2022    5:45 AM 03/07/2022    6:04 AM 03/05/2022    6:40 AM  BMP  Glucose 70 - 99 mg/dL 230  230  188   BUN 6 - 20 mg/dL 16  12  9    Creatinine 0.44 - 1.00 mg/dL 1.14  1.42  1.28   Sodium 135 - 145 mmol/L 135  135  135   Potassium 3.5 - 5.1 mmol/L 3.7  4.2  4.2   Chloride 98 - 111 mmol/L 103  105  105   CO2 22 - 32 mmol/L 23  21  21    Calcium 8.9 - 10.3 mg/dL 8.6  8.8  8.9    Monitor   14.  Constipation: -03/08/22 no BM in 2 days, ordered colace 100mg  BID, miralax 17g QD+PRN, monitor for effect -03/09/22 still no BM, pt resistant to added meds for now, advised that if no BM today, may need to increase regimen tomorrow; monitor   15.  Morbid obesity BMI 38 with DM 2 LOS: 7 days A FACE TO FACE EVALUATION WAS PERFORMED  Charlett Blake 03/14/2022, 9:30 AM

## 2022-03-14 NOTE — Plan of Care (Signed)
  Problem: RH Swallowing Goal: LTG Patient will consume least restrictive diet using compensatory strategies with assistance (SLP) Description: LTG:  Patient will consume least restrictive diet using compensatory strategies with assistance (SLP) Outcome: Not Met (add Reason) Goal: LTG Patient will participate in dysphagia therapy to increase swallow function with assistance (SLP) Description: LTG:  Patient will participate in dysphagia therapy to increase swallow function with assistance (SLP) Outcome: Not Met (add Reason) Note: Pt declined participation   Problem: RH Memory Goal: LTG Patient will demonstrate ability for day to day (SLP) Description: LTG:   Patient will demonstrate ability for day to day recall/carryover during cognitive/linguistic activities with assist  (SLP) Outcome: Not Met (add Reason)   Problem: RH Attention Goal: LTG Patient will demonstrate this level of attention during functional activites (SLP) Description: LTG:  Patient will will demonstrate this level of attention during functional activites (SLP) Outcome: Not Met (add Reason)   Problem: RH Swallowing Goal: LTG Pt will demonstrate functional change in swallow as evidenced by bedside/clinical objective assessment (SLP) Description: LTG: Patient will demonstrate functional change in swallow as evidenced by bedside/clinical objective assessment (SLP) Outcome: Completed/Met   Problem: RH Memory Goal: LTG Patient will use memory compensatory aids to (SLP) Description: LTG:  Patient will use memory compensatory aids to recall biographical/new, daily complex information with cues (SLP) Outcome: Completed/Met

## 2022-03-14 NOTE — Progress Notes (Signed)
Inpatient Rehabilitation Discharge Medication Review by a Pharmacist  A complete drug regimen review was completed for this patient to identify any potential clinically significant medication issues.  High Risk Drug Classes Is patient taking? Indication by Medication  Antipsychotic No   Anticoagulant No   Antibiotic No   Opioid No   Antiplatelet Yes Aspirin, clopidogrel - CVA  Hypoglycemics/insulin Yes Insulin - DM  Vasoactive Medication Yes metoprolol - HTN  Chemotherapy No   Other Yes Atorvastatin - HLD Gabapentin - neuropathy Famotidine - Reflux  Diclofenac, Lidocaine - topical pain relief     Type of Medication Issue Identified Description of Issue Recommendation(s)  Drug Interaction(s) (clinically significant)     Duplicate Therapy     Allergy     No Medication Administration End Date     Incorrect Dose     Additional Drug Therapy Needed     Significant med changes from prior encounter (inform family/care partners about these prior to discharge).    Other       Clinically significant medication issues were identified that warrant physician communication and completion of prescribed/recommended actions by midnight of the next day:  No  Name of provider notified for urgent issues identified:   Time spent performing this drug regimen review (minutes):  30 minutes  Thank you Anette Guarneri, PharmD

## 2022-03-14 NOTE — Plan of Care (Signed)
  Problem: RH Balance Goal: LTG Patient will maintain dynamic standing with ADLs (OT) Description: LTG:  Patient will maintain dynamic standing balance with assist during activities of daily living (OT)  Outcome: Not Met (add Reason) Note: Pt requires supervision for safety when completing dynamic standing balance activities to increase safety d/t Pt decreased safety awareness.    Problem: Sit to Stand Goal: LTG:  Patient will perform sit to stand in prep for activites of daily living with assistance level (OT) Description: LTG:  Patient will perform sit to stand in prep for activites of daily living with assistance level (OT) Outcome: Not Met (add Reason) Note: Pt requires supervision for safety when completing sit> stands to increase safety d/t Pt decreased safety awareness.    Problem: RH Toilet Transfers Goal: LTG Patient will perform toilet transfers w/assist (OT) Description: LTG: Patient will perform toilet transfers with assist, with/without cues using equipment (OT) Outcome: Not Met (add Reason) Note: Pt requires supervision for safety when completing toilet transfers to increase safety d/t Pt decreased safety awareness and balance deficits.    Problem: RH Balance Goal: LTG: Patient will maintain dynamic sitting balance (OT) Description: LTG:  Patient will maintain dynamic sitting balance with assistance during activities of daily living (OT) Outcome: Completed/Met   Problem: RH Eating Goal: LTG Patient will perform eating w/assist, cues/equip (OT) Description: LTG: Patient will perform eating with assist, with/without cues using equipment (OT) Outcome: Completed/Met   Problem: RH Grooming Goal: LTG Patient will perform grooming w/assist,cues/equip (OT) Description: LTG: Patient will perform grooming with assist, with/without cues using equipment (OT) Outcome: Completed/Met   Problem: RH Bathing Goal: LTG Patient will bathe all body parts with assist levels  (OT) Description: LTG: Patient will bathe all body parts with assist levels (OT) Outcome: Completed/Met   Problem: RH Dressing Goal: LTG Patient will perform upper body dressing (OT) Description: LTG Patient will perform upper body dressing with assist, with/without cues (OT). Outcome: Completed/Met Goal: LTG Patient will perform lower body dressing w/assist (OT) Description: LTG: Patient will perform lower body dressing with assist, with/without cues in positioning using equipment (OT) Outcome: Completed/Met   Problem: RH Toileting Goal: LTG Patient will perform toileting task (3/3 steps) with assistance level (OT) Description: LTG: Patient will perform toileting task (3/3 steps) with assistance level (OT)  Outcome: Completed/Met   Problem: RH Functional Use of Upper Extremity Goal: LTG Patient will use RT/LT upper extremity as a (OT) Description: LTG: Patient will use right/left upper extremity as a stabilizer/gross assist/diminished/nondominant/dominant level with assist, with/without cues during functional activity (OT) Outcome: Completed/Met   Problem: RH Simple Meal Prep Goal: LTG Patient will perform simple meal prep w/assist (OT) Description: LTG: Patient will perform simple meal prep with assistance, with/without cues (OT). Outcome: Completed/Met   Problem: RH Tub/Shower Transfers Goal: LTG Patient will perform tub/shower transfers w/assist (OT) Description: LTG: Patient will perform tub/shower transfers with assist, with/without cues using equipment (OT) Outcome: Completed/Met Note: Pt able to complete with supervision.    Problem: RH Memory Goal: LTG Patient will demonstrate ability for day to day recall/carry over during activities of daily living with assistance level (OT) Description: LTG:  Patient will demonstrate ability for day to day recall/carry over during activities of daily living with assistance level (OT). Outcome: Completed/Met   Problem: RH  Attention Goal: LTG Patient will demonstrate this level of attention during functional activites (OT) Description: LTG:  Patient will demonstrate this level of attention during functional activites  (OT) Outcome: Completed/Met

## 2022-03-15 LAB — GLUCOSE, CAPILLARY: Glucose-Capillary: 137 mg/dL — ABNORMAL HIGH (ref 70–99)

## 2022-03-15 NOTE — Progress Notes (Signed)
PROGRESS NOTE   Subjective/Complaints:  Patient ready to go home! No complaints at this time. Has paperwork already.   ROS: +L shoulder pain-no numbness or swelling. Denies fevers, chills, CP, SOB, abd pain, N/V/D/C, new/worsening paresthesias/weakness, or any other complaints at this time.     Objective:   No results found. No results for input(s): "WBC", "HGB", "HCT", "PLT" in the last 72 hours.  No results for input(s): "NA", "K", "CL", "CO2", "GLUCOSE", "BUN", "CREATININE", "CALCIUM" in the last 72 hours.   Intake/Output Summary (Last 24 hours) at 03/15/2022 0713 Last data filed at 03/14/2022 2028 Gross per 24 hour  Intake 1080 ml  Output --  Net 1080 ml         Physical Exam: Vital Signs Blood pressure (!) 152/78, pulse 60, temperature 98.7 F (37.1 C), resp. rate 17, height 5\' 8"  (1.727 m), weight 114.1 kg, last menstrual period 03/30/2016, SpO2 93 %.  Physical Exam  General: No acute distress, sitting in w/c ready to go home.  Mood and affect are appropriate Heart: Regular rate and rhythm no rubs murmurs or extra sounds Lungs: Clear to auscultation, breathing unlabored, no rales or wheezes Abdomen: Positive bowel sounds, soft nontender to palpation, nondistended Extremities: No clubbing, cyanosis, or edema  Minimal hyper sensitivity L shoulder   Motor strength is 3- LUE 4/5 LLE, 5/5 on the Right side  Patient is alert.  Has eye contact with examiner.  Speech is mildly dysarthric but intelligible.  Provides name and age.  Follows simple commands.  Psychiatric:     much brighter  Assessment/Plan: 1. Functional deficits which require 3+ hours per day of interdisciplinary therapy in a comprehensive inpatient rehab setting. Physiatrist is providing close team supervision and 24 hour management of active medical problems listed below. Physiatrist and rehab team continue to assess barriers to discharge/monitor  patient progress toward functional and medical goals  Care Tool:  Bathing    Body parts bathed by patient: Chest, Abdomen, Front perineal area, Right upper leg, Left upper leg, Face, Buttocks, Right lower leg, Left lower leg, Left arm, Right arm   Body parts bathed by helper: Buttocks, Left lower leg, Right lower leg     Bathing assist Assist Level: Supervision/Verbal cueing     Upper Body Dressing/Undressing Upper body dressing   What is the patient wearing?: Pull over shirt    Upper body assist Assist Level: Supervision/Verbal cueing    Lower Body Dressing/Undressing Lower body dressing      What is the patient wearing?: Underwear/pull up, Pants     Lower body assist Assist for lower body dressing: Supervision/Verbal cueing     Toileting Toileting    Toileting assist Assist for toileting: Supervision/Verbal cueing     Transfers Chair/bed transfer  Transfers assist     Chair/bed transfer assist level: Contact Guard/Touching assist Chair/bed transfer assistive device: Programmer, multimedia   Ambulation assist      Assist level: Minimal Assistance - Patient > 75% Assistive device: Walker-rolling Max distance: 1107ft   Walk 10 feet activity   Assist     Assist level: Supervision/Verbal cueing Assistive device: Walker-rolling   Walk 50 feet activity  Assist    Assist level: Contact Guard/Touching assist Assistive device: Walker-rolling    Walk 150 feet activity   Assist Walk 150 feet activity did not occur: Safety/medical concerns  Assist level: Contact Guard/Touching assist Assistive device: Walker-rolling    Walk 10 feet on uneven surface  activity   Assist Walk 10 feet on uneven surfaces activity did not occur: Safety/medical concerns   Assist level: Minimal Assistance - Patient > 75% Assistive device: Walker-rolling   Wheelchair     Assist Is the patient using a wheelchair?: Yes Type of Wheelchair: Manual     Wheelchair assist level: Dependent - Patient 0%      Wheelchair 50 feet with 2 turns activity    Assist        Assist Level: Dependent - Patient 0%   Wheelchair 150 feet activity     Assist      Assist Level: Dependent - Patient 0%   Blood pressure (!) 152/78, pulse 60, temperature 98.7 F (37.1 C), resp. rate 17, height 5\' 8"  (1.727 m), weight 114.1 kg, last menstrual period 03/30/2016, SpO2 93 %.  Medical Problem List and Plan: 1. 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  Antithrombotics: -DVT/anticoagulation:  Pharmaceutical: Lovenox 60mg  QD -antiplatelet therapy: Aspirin 81 mg daily and Plavix 75 mg daily x 3 months then Plavix alone 3. Severe left upper extremity/shoulder pain: likely hyperalgesia related to left lateral medullary infarct -03/08/22 Xray showing calcifications suggestive of calcific tendinosis -trial of gabapentin 100mg  tid -voltaren gel 1% 2g TID for shoulder -Lidoderm patch 5% QD -ROM activities with therapy -Tylenol PRN, Tramadol 50mg  q6h PRN -1/29- has trap pain  as well a impingement pain- may benefit from injection , pt declines  4. Mood/Behavior/Sleep: Provide emotional support             -antipsychotic agents: N/A 5. Neuropsych/cognition: This patient is capable of making decisions on her own behalf. 6. Skin/Wound Care: Routine skin checks 7. Fluids/Electrolytes/Nutrition: Routine in and outs with follow-up chemistries on 03/10/22 8.  Dysphagia.  Diet advanced to mechanical soft thin liquids.   -tolerating presently -advance per speech therapy 9.  Hyperlipidemia.  Lipitor 80mg  QD 10.  Diabetes mellitus.  Hemoglobin A1c 8.7.  Currently on SSI.  Prior to admission patient  on DULAGLUTIDE 3 mg every "Sunday and HUMALOG 50/50 80 units 3 times daily, JANUVIA 100 mg daily.  has home humalog brought in by fiancee' currently at bedside , RN to administer.  Discussed that will initiate at lower dose and titrate up with DM coordinator assist  -Start Humalog 50/50 at 40 U TID with meals   -CBG creeping up increase humalog to 50U TID -03/15/22 CBGs variable, pt to continue meds at home as per d/c instructions  CBG (last 3)  Recent Labs    03/14/22 1639 03/14/22 2055 03/15/22 0602  GLUCAP 175* 240* 137*     11" .  Hypertension/SVT.  Lopressor 25 mg twice daily.  Monitor with increased mobility -03/08/22 Had tachycardic/hypertensive/diaphoretic episode with PT this morning, increased Lopressor to 37.5mg  BID and additional 25mg  dose once this morning, monitor -may increase metoprolol to 50mg  BID -03/15/22 BPs slightly elevated, will f/up outpatient  Vitals:   03/11/22 1407 03/11/22 1940 03/12/22 0348 03/12/22 1245  BP: 133/85 (!) 142/65 (!) 164/66 (!) 140/80   03/12/22 1909 03/13/22 0535 03/13/22 1249 03/13/22 1938  BP: (!) 156/73 (!) 164/75 (!) 143/89 (!) 155/67   03/14/22 0530 03/14/22 1309 03/14/22 2028 03/15/22 0311  BP: (!) 144/70 137/66 (!) 157/75 (!) 152/78     12.  Obesity.  BMI 40.12.  Dietary follow-up 13.  CKD stage IIIB.  Latest creatinine 1.28.  Follow-up chemistries  -03/15/22 Cr downtrending, will f/up outpatient    Latest Ref Rng & Units 03/10/2022    5:45 AM 03/07/2022    6:04 AM 03/05/2022    6:40 AM  BMP  Glucose 70 - 99 mg/dL 230  230  188   BUN 6 - 20 mg/dL 16  12  9    Creatinine 0.44 - 1.00 mg/dL 1.14  1.42  1.28   Sodium 135 - 145 mmol/L 135  135  135   Potassium 3.5 - 5.1 mmol/L 3.7  4.2  4.2   Chloride 98 - 111 mmol/L 103  105  105   CO2 22 - 32 mmol/L 23  21  21    Calcium 8.9 - 10.3 mg/dL 8.6  8.8  8.9     14.  Constipation: -03/08/22 no BM in 2 days, ordered colace 100mg  BID, miralax 17g QD+PRN, monitor for effect -03/09/22 still no BM,  pt resistant to added meds for now, advised that if no BM today, may need to increase regimen tomorrow; monitor  -03/15/22 LBM yesterday, advised keeping up with bowel regimen at home   15.  Morbid obesity BMI 38 with DM 2   LOS: 8 days A FACE TO [employer] 03/15/2022, 7:13 AM

## 2022-03-18 ENCOUNTER — Telehealth: Payer: Self-pay | Admitting: Registered Nurse

## 2022-03-18 NOTE — Telephone Encounter (Signed)
Transitional Care call  Patient name:  Michelle Brooks. Carlota Raspberry  DOB: [birth year] Are you/is patient experiencing any problems since coming home? No Are there any questions regarding any aspect of care? No Are there any questions regarding medications administration/dosing? No Are meds being taken as prescribed? Yes "Patient should review meds with caller to confirm" Medication List Reviewed Have there been any falls? No Has Home Health been to the house and/or have they contacted you? [place] If not, have you tried to contact them? Ms. Odonnel states [facility] have called and came to her house.she was unable to open door. They are scheduled to come to her house this week. Can we help you contact them? No. Are bowels and bladder emptying properly? Yes Are there any unexpected incontinence issues? No If applicable, is patient following bowel/bladder programs? NA Any fevers, problems with breathing, unexpected pain? No Are there any skin problems or new areas of breakdown? No Has the patient/family member arranged specialty MD follow up (ie cardiology/neurology/renal/surgical/etc.)?  This provider called [facility], to scheduled [place] appointment, It will be on March 7th, 2024 at 3:30. Ms. Babin called her PCP and will scheduled HFU appointment she states. Can we help arrange? Yes Does the patient need any other services or support that we can help arrange? No Are caregivers following through as expected in assisting the patient? Yes Has the patient quit smoking, drinking alcohol, or using drugs as recommended? (                        )  Appointment date/time 03/26/2022   arrival time 12:40 for 1:00 appointment with Bayard Hugger ANP-C. At  [place]

## 2022-03-26 ENCOUNTER — Encounter: Payer: 59 | Attending: Registered Nurse | Admitting: Registered Nurse

## 2022-03-26 ENCOUNTER — Other Ambulatory Visit: Payer: Self-pay | Admitting: Registered Nurse

## 2022-03-26 ENCOUNTER — Encounter: Payer: Self-pay | Admitting: Registered Nurse

## 2022-03-26 VITALS — BP 144/82 | HR 67 | Ht 68.0 in | Wt 250.0 lb

## 2022-03-26 DIAGNOSIS — I6350 Cerebral infarction due to unspecified occlusion or stenosis of unspecified cerebral artery: Secondary | ICD-10-CM | POA: Diagnosis present

## 2022-03-26 DIAGNOSIS — I10 Essential (primary) hypertension: Secondary | ICD-10-CM | POA: Diagnosis present

## 2022-03-26 DIAGNOSIS — E7849 Other hyperlipidemia: Secondary | ICD-10-CM | POA: Insufficient documentation

## 2022-03-26 MED ORDER — METOPROLOL TARTRATE 50 MG PO TABS: 50.0000 mg | ORAL_TABLET | Freq: Two times a day (BID) | ORAL | 0 refills | Status: AC

## 2022-03-26 MED ORDER — ATORVASTATIN CALCIUM 80 MG PO TABS: 80.0000 mg | ORAL_TABLET | Freq: Every day | ORAL | 0 refills | Status: AC

## 2022-03-26 NOTE — Progress Notes (Signed)
Subjective:    Patient ID: Michelle Brooks, female    DOB: 01-15-1966, 57 y.o.   MRN: [id number]  HPI: Michelle Brooks is a 57 y.o. female who  is here for Transitional Care Visit for Follow up of her Right Pontine Cerebrovascular Accident, Essential Hypertension and Hyperlipidemia. She presented to [facility] ED with complaints of nausea, vomiting, left eyelid droop, difficulty swallowing and right sided- weakness.  Dr Lorin Mercy H&P: on 02/26/2022 HPI: Michelle Brooks is a 57 y.o. female with medical history significant of chronic back pain, DM, HTN, prior CVA, and HLD presenting with emesis.  She has a h/o prior CVAs and these have historically presented with n/v.  She ate a lot of shrimp (no known allergy) and awoke yesterday AM with n/v.  She also noticed some dysphagia, dysarthria, tongue swelling, and eventual left-sided weakness (facial droop as well as LUE/LLE).   Her symptoms have progressed some while in the ER.  The patient appears to be more concerned about possible allergic reaction as the cause for her difficulty swallowing.   CT WO Contrast:  IMPRESSION: 1. Vague hypodensity in the frontal lobe on the left which is new from the previous exam. MRI is suggested for further evaluation. 2. Chronic microvascular ischemic changes with old infarcts in the right cerebellar hemisphere and right frontal lobe.   CT Angio:  IMPRESSION: 1. In comparison to 2020 CTA, some progression of occlusive intracranial disease, suggestive of moyamoya type process and described above. 2. Evaluation of the proximal vertebral arteries limited by streak artifact with apparent non-opacification of the left vertebral artery proximally and poor opacification right vertebral artery, suspicious for interval proximal occlusion and/or stenosis. Distal reconstitution bilaterally. Intracranially, posterior cerebral arteries are largely supplied by posterior communicating arteries.  MR Brain: WO Contrast:   IMPRESSION: 1. Small acute infarcts in the left lateral medulla, right pontine medullary junction, and left cerebellum. 2. Remote infarcts in the right cerebellum and bilateral cerebral cortex.  Neurology was consulted: she was maintained on low dose aspirin and Plavix x3 months then Plavix alone.   Michelle Brooks was admitted to inpatient rehabilitation on 03/07/2022 and discharged home on 03/15/2022. She is receiving Home Health Therapy with [facility]. She denies any pain at this time. She rated her pain 4 on Health and History.   She arrived in wheelchair, she states she is walking with walker in the home.   Pain Inventory Average Pain 4 Pain Right Now 4 My pain is  side of face  LOCATION OF PAIN  head  BOWEL Number of stools per week: 2 Oral laxative use No  Type of laxative . Enema or suppository use No  History of colostomy No  Incontinent No   BLADDER Normal In and out cath, frequency . Able to self cath  . Bladder incontinence No  Frequent urination No  Leakage with coughing Yes  Difficulty starting stream No  Incomplete bladder emptying No    Mobility ability to climb steps?  yes do you drive?  no use a wheelchair needs help with transfers  Function disabled: date disabled .  Neuro/Psych weakness trouble walking  Prior Studies TC appt  Physicians involved in your care TC appt   Family History  Problem Relation Age of Onset   Diabetes Mother    Stroke Mother 61   Diabetes Brother    Diabetes Brother    Cancer Brother 41       unknown type   Hypertension Brother  Social History   Socioeconomic History   Marital status: Divorced    Spouse name: Not on file   Number of children: 3   Years of education: 12th grade   Highest education level: Not on file  Occupational History   Occupation: unemployed    Comment: previously worked in [place] before her stroke  Tobacco Use   Smoking status: Former    Packs/day: 0.20    Years: 7.00     Total pack years: 1.40    Types: Cigarettes    Quit date: 03/26/2004    Years since quitting: 18.0   Smokeless tobacco: Never  Vaping Use   Vaping Use: Never used  Substance and Sexual Activity   Alcohol use: No   Drug use: No   Sexual activity: Yes    Partners: Male    Birth control/protection: Condom  Other Topics Concern   Not on file  Social History Narrative   Recently moved from [place] in 10/2010 due to wanting a change from stressors.   Previously followed by Dr. Wynelle Bourgeois at [facility] Strain: Not on file  Food Insecurity: No Food Insecurity (03/03/2022)   Hunger Vital Sign    Worried About Running Out of Food in the Last Year: Never true    Ran Out of Food in the Last Year: Never true  Transportation Needs: No Transportation Needs (03/03/2022)   PRAPARE - Hydrologist (Medical): No    Lack of Transportation (Non-Medical): No  Physical Activity: Not on file  Stress: Not on file  Social Connections: Not on file   Past Surgical History:  Procedure Laterality Date   BREAST LUMPECTOMY  2002   states benign lymph node removed.   BUBBLE STUDY  08/30/2018   Procedure: BUBBLE STUDY;  Surgeon: Thayer Headings, MD;  Location: [place] [facility];  Service: Cardiovascular;;   ENDOMETRIAL BIOPSY  10/04/2020   LOOP RECORDER INSERTION N/A 08/30/2018   Procedure: LOOP RECORDER INSERTION;  Surgeon: Evans Lance, MD;  Location: [place] [facility] CV LAB;  Service: Cardiovascular;  Laterality: N/A;   TEE WITHOUT CARDIOVERSION N/A 08/30/2018   Procedure: TRANSESOPHAGEAL ECHOCARDIOGRAM (TEE);  Surgeon: Thayer Headings, MD;  Location: [facility] ENDOSCOPY;  Service: Cardiovascular;  Laterality: N/A;   Past Medical History:  Diagnosis Date   Chronic back pain    Diabetes mellitus type 2 in obese ([place])    Diabetic retinopathy ([place])    GERD (gastroesophageal reflux disease)    Headache    History of stroke 03/2010   Right MCA  stroke in 03/2010, with MRI also noting subacute strokes in left fronto parietal area - occured in [place] received care at [facility] thought due to uncontrolled blood pressure // No residual deficits // TTE (03/2010) at [facility] - normal LV systolic function, moderate pericardial effusion with 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   HSV-2 infection    Hyperlipidemia LDL goal < 100 04/01/2011   Hypertension    Lumbago    pain in the back   BP (!) 144/82   Pulse 67   Ht 5' 8"$  (1.727 m)   Wt 250 lb (113.4 kg)   LMP 03/30/2016   SpO2 95%   BMI 38.01 kg/m   Opioid Risk Score:   Fall Risk Score:  `1  Depression screen [facility] 2/9     10/11/2018  5:09 PM 04/08/2016    9:26 AM 10/31/2014    4:36 PM 10/05/2014   11:36 AM 09/15/2014    3:23 PM 09/07/2014    1:35 PM 05/13/2012    3:15 PM  Depression screen PHQ 2/9  Decreased Interest 0 1 1 1 $ 0 1 0  Down, Depressed, Hopeless 0 1 0 1 0 1 0  PHQ - 2 Score 0 2 1 2 $ 0 2 0  Altered sleeping  2  1  1   $ Tired, decreased energy  1  2  1   $ Change in appetite  1  1  0   Feeling bad or failure about yourself   1  0  1   Trouble concentrating  1  1  0   Moving slowly or fidgety/restless  1  1  0   Suicidal thoughts  1  0  0   PHQ-9 Score  10  8  5   $ Difficult doing work/chores      Not difficult at all      Review of Systems  Musculoskeletal:  Positive for gait problem.  Neurological:  Positive for weakness.       Head  All other systems reviewed and are negative.     Objective:   Physical Exam Vitals and nursing note reviewed.  Constitutional:      Appearance: Normal appearance.  Cardiovascular:     Rate and Rhythm: Normal rate and regular rhythm.     Pulses: Normal pulses.     Heart sounds: Normal heart sounds.  Pulmonary:     Effort: Pulmonary effort is normal.     Breath sounds: Normal breath sounds.  Musculoskeletal:     Cervical back: Normal range of motion and neck supple.      Comments: Normal Muscle Bulk and Muscle Testing Reveals:  Upper Extremities: Full ROM and Muscle Strength 5/5  Lower Extremities: Full ROM and Muscle Strength 5/5 Arrived in wheelchair     Skin:    General: Skin is warm and dry.  Neurological:     Mental Status: She is alert and oriented to person, place, and time.  Psychiatric:        Mood and Affect: Mood normal.        Behavior: Behavior normal.         Assessment & Plan:   Right Pontine Cerebrovascular Accident: [place] with [place]. She has a scheduled appointment with Neurology on 04/17/2022, Dr Krista Blue..  Essential Hypertension: Continue current medication regimen. She has a scheduled appointment with her PCP in 2 weeks she reports.   Hyperlipidemia.: Continue current medication regimen. She has a scheduled appointment with her PCP in 2 weeks she reports.  F/U with Dr. Letta Pate in 4-6 weeks

## 2022-04-07 ENCOUNTER — Inpatient Hospital Stay: Payer: Medicare Other

## 2022-04-17 ENCOUNTER — Encounter: Payer: Self-pay | Admitting: Neurology

## 2022-04-17 ENCOUNTER — Ambulatory Visit (INDEPENDENT_AMBULATORY_CARE_PROVIDER_SITE_OTHER): Payer: 59 | Admitting: Neurology

## 2022-04-17 VITALS — BP 134/91 | HR 91 | Ht 68.0 in | Wt 249.0 lb

## 2022-04-17 DIAGNOSIS — I639 Cerebral infarction, unspecified: Secondary | ICD-10-CM | POA: Insufficient documentation

## 2022-04-17 DIAGNOSIS — R269 Unspecified abnormalities of gait and mobility: Secondary | ICD-10-CM | POA: Insufficient documentation

## 2022-04-17 MED ORDER — CLOPIDOGREL BISULFATE 75 MG PO TABS: 75.0000 mg | ORAL_TABLET | Freq: Every day | ORAL | 3 refills | Status: AC

## 2022-04-17 NOTE — Progress Notes (Signed)
Chief Complaint  Patient presents with   New Patient (Initial Visit)    Rm 12, Here to discuss blood thinners       ASSESSMENT AND PLAN  Michelle Brooks is a 57 y.o. 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   MEDICAL HISTORY:  Michelle Brooks, is a 57 year old female seen in request by her primary care [facility] Dr. Nolene Ebbs, for evaluation of stroke, initial evaluation was on April 17, 2022  I reviewed and summarized the referring note. PMHx HLD DM HTN Stroke History of loop recorder   She suffered stroke in January 2024, was seen by Dr. Leonie Man at the hospital, already had a history of stroke in the past, first in 2011 treated in [place], July 2020 acute right cerebellar infarction, July 2016, acute subcentimeter infarction of the right lateral medulla and right inferior cerebellar peduncle within the distribution of the right posterior/inferior cerebellar artery  In January 2024 she presented with nausea, vomiting, not feeling well, gait abnormality, personally reviewed MRI of the brain, left medulla right pontine, and left cerebellar stroke, also evidence of remote infarction at the right cerebellum bilateral cerebral cortex  CT angiogram of head and neck in comparison to CT angiogram in 2020, some progression of occlusive intracranial disease,  suggestive of moyamoya disease process,   Severe, 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 Moderate right cavernous/paraclinoid ICA stenosis. Bilaterally there is occlusive or near occlusive stenosis of the M1 MCAs, likely mildly progressed from the prior. There are collaterals bilaterally. The left A1 ACA is diminutive. The right A1 ACA more distal ACAs are patent.   Posterior circulation: Small vertebrobasilar system. The left intradural vertebral artery is stenotic but remains patent. The right intradural vertebral artery terminates as PICA, anatomic variant. Irregular opacification of the basilar artery, similar. The posterior communicating arteries largely supply the posterior cerebral areas which remain patent.  Echocardiogram showed normal ejection fraction,  Normal TSH, negative HIV, A1c was 8.7, LDL 118  She was on aspirin prior to admission, plan was to overlapping aspirin 81+ Plavix 75 mg daily for 3 months then Plavix alone,  She went to rehabilitation, now back home, drove herself to clinic today, ambulate with walker, confused about her medication regimen, went over her medication in detail with her,  She have diabetic retinopathy, already had left vision problem before her most recent stroke, was seeing retinal specialist, receiving left eye injection, which did not help her vision much, complains of left-sided headache,  Reported history of loop recorder in the past, had for 2 years, with no significant abnormality found, was taken out end of 2023  PHYSICAL EXAM:   Vitals:   04/17/22 1536  BP: (!) 134/91  Pulse: 91  Weight: 249 lb (112.9 kg)  Height: '5\' 8"'$  (1.727 m)   Not recorded     Body mass index is 37.86 kg/m.  PHYSICAL EXAMNIATION:  Gen:  NAD, conversant, well nourised, well groomed                     Cardiovascular: Regular rate rhythm, no peripheral edema, warm, nontender. Eyes: Conjunctivae clear without  exudates or hemorrhage Neck: Supple, no carotid bruits. Pulmonary: Clear to auscultation bilaterally   NEUROLOGICAL EXAM:  MENTAL STATUS: Speech/cognition: Awake, alert, oriented to history taking and casual conversation CRANIAL NERVES: CN II: Visual fields are full to confrontation. Pupils are round equal and briskly reactive to light. OS 20/200, OD 20/50. CN III, IV, VI: extraocular movement are normal. No ptosis. CN V: Facial sensation is intact to light touch CN VII: Mild left upper and lower face weakness, CN VIII: Hearing is normal to causal conversation. CN IX, X: Phonation is normal. CN XI: Head turning and shoulder shrug are intact  MOTOR: Mild fixation of left upper extremity on rapid rotating movement  REFLEXES: Reflexes are 1 and symmetric at the biceps, triceps, knees, and ankles. Plantar responses are flexor.  SENSORY: Length-dependent decreased light touch, pinprick to ankle level, decreased light touch on the right side of her body  COORDINATION: No significant limb dysmetria, moderate truncal ataxia  GAIT/STANCE: Need push-up to get up from seated position, wide-based, very unsteady  REVIEW OF SYSTEMS:  Full 14 system review of systems performed and notable only for as above All other review of systems were negative.   ALLERGIES: Allergies  Allergen Reactions   Lactose Intolerance (Gi) Diarrhea and Nausea And Vomiting   Latex     Like a burning in area    HOME MEDICATIONS: Current Outpatient Medications  Medication Sig Dispense Refill   acetaminophen (TYLENOL) 325 MG tablet Take 1-2 tablets (325-650 mg total) by mouth every 4 (four) hours as needed for mild pain.     aspirin EC 81 MG tablet Take 1 tablet (81 mg total) by mouth daily. 30 tablet 2   atorvastatin (LIPITOR) 80 MG tablet Take 1 tablet (80 mg total) by mouth daily. 30 tablet 0   Blood Glucose Monitoring Suppl (ACCU-CHEK AVIVA PLUS) W/DEVICE KIT 1 kit by Does not apply route 3 (three) times  daily before meals. 1 kit 0   Blood Glucose Monitoring Suppl (TRUE METRIX METER) DEVI 1 each by Does not apply route 4 (four) times daily -  before meals and at bedtime. 1 Device 0   cholecalciferol (VITAMIN D3) 10 MCG (400 UNIT) TABS tablet Take 1 tablet (400 Units total) by mouth daily. 30 tablet 0   clopidogrel (PLAVIX) 75 MG tablet Take 1 tablet (75 mg total) by mouth daily. 30 tablet 0   diclofenac Sodium (VOLTAREN) 1 % GEL Apply 2 g topically 3 (three) times daily.     diphenhydrAMINE (BENADRYL) 25 mg capsule Take 1 capsule (25 mg total) by mouth every 8 (eight) hours as needed for allergies or sleep. 30 capsule 0   glucose blood (TRUE METRIX BLOOD GLUCOSE TEST) test strip Use as instructed 100 each 5   insulin lispro protamine-lispro (HUMALOG 50/50 MIX) (50-50) 100 UNIT/ML SUSP injection Inject 0.5 mLs (50 Units total) into the skin 3 (three) times daily with meals. 10 mL 11   metoprolol tartrate (LOPRESSOR) 50 MG tablet Take 1 tablet (50 mg total) by mouth 2 (two) times daily. 60 tablet 0   sitaGLIPtin (JANUVIA) 100 MG tablet      TRUEPLUS LANCETS 28G MISC 1 each by Does not apply route 4 (four) times daily -  before meals and at bedtime. 100  each 5   valsartan (DIOVAN) 40 MG tablet Take 40 mg by mouth daily.     No current facility-administered medications for this visit.    PAST MEDICAL HISTORY: Past Medical History:  Diagnosis Date   Chronic back pain    Diabetes mellitus type 2 in obese (HCC)    Diabetic retinopathy ([place])    GERD (gastroesophageal reflux disease)    Headache    History of stroke 03/2010   Right MCA stroke in 03/2010, with MRI also noting subacute strokes in left fronto parietal area - occured in [place] received care at [facility] thought due to uncontrolled blood pressure // No residual deficits // TTE (03/2010) at [facility] - normal LV systolic function, moderate pericardial effusion with diastolic collapse - consistent with pericardial tampanode // TEE (03/2010) - no  cardiac souce of emboli.   HSV-2 infection    Hyperlipidemia LDL goal < 100 04/01/2011   Hypertension    Lumbago    pain in the back    PAST SURGICAL HISTORY: Past Surgical History:  Procedure Laterality Date   BREAST LUMPECTOMY  2002   states benign lymph node removed.   BUBBLE STUDY  08/30/2018   Procedure: BUBBLE STUDY;  Surgeon: Thayer Headings, MD;  Location: [facility];  Service: Cardiovascular;;   ENDOMETRIAL BIOPSY  10/04/2020   LOOP RECORDER INSERTION N/A 08/30/2018   Procedure: LOOP RECORDER INSERTION;  Surgeon: Evans Lance, MD;  Location: [place] [facility];  Service: Cardiovascular;  Laterality: N/A;   TEE WITHOUT CARDIOVERSION N/A 08/30/2018   Procedure: TRANSESOPHAGEAL ECHOCARDIOGRAM (TEE);  Surgeon: Acie Fredrickson, Wonda Cheng, MD;  Location: [facility] ENDOSCOPY;  Service: Cardiovascular;  Laterality: N/A;    FAMILY HISTORY: Family History  Problem Relation Age of Onset   Diabetes Mother    Stroke Mother 61   Diabetes Brother    Diabetes Brother    Cancer Brother 60       unknown type   Hypertension Brother     SOCIAL HISTORY: Social History   Socioeconomic History   Marital status: Divorced    Spouse name: Not on file   Number of children: 3   Years of education: 12th grade   Highest education level: Not on file  Occupational History   Occupation: unemployed    Comment: previously worked in [place] before her stroke  Tobacco Use   Smoking status: Former    Packs/day: 0.20    Years: 7.00    Total pack years: 1.40    Types: Cigarettes    Quit date: 03/26/2004    Years since quitting: 18.0   Smokeless tobacco: Never  Vaping Use   Vaping Use: Never used  Substance and Sexual Activity   Alcohol use: No   Drug use: No   Sexual activity: Yes    Partners: Male    Birth control/protection: Condom  Other Topics Concern   Not on file  Social History Narrative   Recently moved from [place] in 10/2010 due to wanting a change from stressors.   Previously followed by  Dr. Wynelle Bourgeois at [facility] Strain: Not on file  Food Insecurity: No Food Insecurity (03/03/2022)   Hunger Vital Sign    Worried About Running Out of Food in the Last Year: Never true    Ran Out of Food in the Last Year: Never true  Transportation Needs: No Transportation Needs (03/03/2022)   PRAPARE - Transportation  Lack of Transportation (Medical): No    Lack of Transportation (Non-Medical): No  Physical Activity: Not on file  Stress: Not on file  Social Connections: Not on file  Intimate Partner Violence: Not on file      Marcial Pacas, M.D. Ph.D.  [facility] [address] [place], [place] [address] Ph: [phone] Fax: [phone]  CC:  Cathlyn Parsons, PA-C [place],  [place] [address]  Nolene Ebbs, MD

## 2022-04-29 ENCOUNTER — Encounter: Payer: 59 | Attending: Registered Nurse | Admitting: Physical Medicine & Rehabilitation

## 2022-04-29 ENCOUNTER — Encounter: Payer: Self-pay | Admitting: Physical Medicine & Rehabilitation

## 2022-04-29 VITALS — BP 161/86 | HR 63 | Ht 68.0 in | Wt 253.6 lb

## 2022-04-29 DIAGNOSIS — I69398 Other sequelae of cerebral infarction: Secondary | ICD-10-CM | POA: Diagnosis present

## 2022-04-29 DIAGNOSIS — R269 Unspecified abnormalities of gait and mobility: Secondary | ICD-10-CM | POA: Insufficient documentation

## 2022-04-29 DIAGNOSIS — Z8673 Personal history of transient ischemic attack (TIA), and cerebral infarction without residual deficits: Secondary | ICD-10-CM | POA: Insufficient documentation

## 2022-04-29 NOTE — Patient Instructions (Signed)
Dr. Jeanie Cooks is treating your blood pressure  Your stroke was related to the narrowed blood vessels in the brain and not due to heart rhythm issues, you need to follow up with the neurologist

## 2022-04-29 NOTE — Progress Notes (Signed)
Subjective:    Patient ID: Michelle Brooks, female    DOB: [birth year], 57 y.o.   MRN: [id number] She suffered stroke in January 2024, was seen by Dr. Leonie Man at the hospital, already had a history of stroke in the past, first in 2011 treated in [place], July 2020 acute right cerebellar infarction, July 2016, acute subcentimeter infarction of the right lateral medulla and right inferior cerebellar peduncle within the distribution of the right posterior/inferior cerebellar artery  57 y.o. female with history of chronic back pain diabetes mellitus CKD stage III obesity with BMI 40.12 hypertension prior right MCA CVA 2012 with loop recorder insertion maintained on aspirin, hyperlipidemia quit smoking 17 years ago. Per chart review lives with her boyfriend. Modified independent prior to admission. Presented 02/26/2022 with nausea vomiting left eyelid droop difficulty in swallowing and right-sided weakness. CT/MRI showed small acute infarct in the left lateral medulla right pontine medullary junction and left cerebellum. Remote infarcts in the right cerebellum and bilateral cerebral cortex. CT angiogram head and neck in comparison to 2020 CTA with some progression of occlusive intracranial disease, suggestive of moyamoya type process. Admission chemistries unremarkable except glucose 164 creatinine 1.09 hemoglobin A1c 8.7. Patient did not receive tPA. Echocardiogram with ejection fraction of 60 to 65% no wall motion abnormalities grade 1 diastolic dysfunction. Neurology follow-up placed on low-dose aspirin and Plavix x 3 months then Plavix alone. Her diet was advanced to a mechanical soft thin liquid diet. Lovenox added for DVT prophylaxis. Patient did have episode of SVT sinus tachycardia 1/22 given metoprolol converted to sinus tach and then to sinus rhythm. No concern for A-fib at this time she was placed on metoprolol.     Admit date: 03/07/2022 Discharge date: 03/15/2022 HPI  Mod I dressing and bathing  Pt  has driven to corner store but cannot drive to Dr office , doesn't drive at night , nephew helps to transport her  PT, OT,Home health No falls at home Stays in bed a lot Mod I dressing and bathing   The pt is concerned that removing loop recorder caused another stroke.  We discussed the etiology of her stroke, diffuse severe intracranial stenosis and no evidence of AFib on loop recorder .   Pain Inventory Average Pain 6 Pain Right Now 0 My pain is intermittent, sharp, tingling, and aching  In the last 24 hours, has pain interfered with the following? General activity 1 Relation with others 1 Enjoyment of life 1 What TIME of day is your pain at its worst? varies Sleep (in general) Fair  Pain is worse with: walking, bending, sitting, standing, and some activites Pain improves with: rest, heat/ice, therapy/exercise, medication, and injections Relief from Meds: 5  Family History  Problem Relation Age of Onset   Diabetes Mother    Stroke Mother 6   Diabetes Brother    Diabetes Brother    Cancer Brother 81       unknown type   Hypertension Brother    Social History   Socioeconomic History   Marital status: Divorced    Spouse name: Not on file   Number of children: 3   Years of education: 12th grade   Highest education level: Not on file  Occupational History   Occupation: unemployed    Comment: previously worked in [place] before her stroke  Tobacco Use   Smoking status: Former    Packs/day: 0.20    Years: 7.00    Additional pack years: 0.00  Total pack years: 1.40    Types: Cigarettes    Quit date: 03/26/2004    Years since quitting: 18.1   Smokeless tobacco: Never  Vaping Use   Vaping Use: Never used  Substance and Sexual Activity   Alcohol use: No   Drug use: No   Sexual activity: Yes    Partners: Male    Birth control/protection: Condom  Other Topics Concern   Not on file  Social History Narrative   Recently moved from [place] in 10/2010 due to wanting a  change from stressors.   Previously followed by Dr. Wynelle Bourgeois at [facility] Strain: Not on file  Food Insecurity: No Food Insecurity (03/03/2022)   Hunger Vital Sign    Worried About Running Out of Food in the Last Year: Never true    Ran Out of Food in the Last Year: Never true  Transportation Needs: No Transportation Needs (03/03/2022)   PRAPARE - Hydrologist (Medical): No    Lack of Transportation (Non-Medical): No  Physical Activity: Not on file  Stress: Not on file  Social Connections: Not on file   Past Surgical History:  Procedure Laterality Date   BREAST LUMPECTOMY  2002   states benign lymph node removed.   BUBBLE STUDY  08/30/2018   Procedure: BUBBLE STUDY;  Surgeon: Thayer Headings, MD;  Location: [facility];  Service: Cardiovascular;;   ENDOMETRIAL BIOPSY  10/04/2020   LOOP RECORDER INSERTION N/A 08/30/2018   Procedure: LOOP RECORDER INSERTION;  Surgeon: Evans Lance, MD;  Location: [place] [facility];  Service: Cardiovascular;  Laterality: N/A;   TEE WITHOUT CARDIOVERSION N/A 08/30/2018   Procedure: TRANSESOPHAGEAL ECHOCARDIOGRAM (TEE);  Surgeon: Acie Fredrickson Wonda Cheng, MD;  Location: [facility] ENDOSCOPY;  Service: Cardiovascular;  Laterality: N/A;   Past Surgical History:  Procedure Laterality Date   BREAST LUMPECTOMY  2002   states benign lymph node removed.   BUBBLE STUDY  08/30/2018   Procedure: BUBBLE STUDY;  Surgeon: Thayer Headings, MD;  Location: [facility];  Service: Cardiovascular;;   ENDOMETRIAL BIOPSY  10/04/2020   LOOP RECORDER INSERTION N/A 08/30/2018   Procedure: LOOP RECORDER INSERTION;  Surgeon: Evans Lance, MD;  Location: [place] [facility];  Service: Cardiovascular;  Laterality: N/A;   TEE WITHOUT CARDIOVERSION N/A 08/30/2018   Procedure: TRANSESOPHAGEAL ECHOCARDIOGRAM (TEE);  Surgeon: Thayer Headings, MD;  Location: [facility] ENDOSCOPY;  Service: Cardiovascular;  Laterality: N/A;    Past Medical History:  Diagnosis Date   Chronic back pain    Diabetes mellitus type 2 in obese ([place])    Diabetic retinopathy ([place])    GERD (gastroesophageal reflux disease)    Headache    History of stroke 03/2010   Right MCA stroke in 03/2010, with MRI also noting subacute strokes in left fronto parietal area - occured in [place] received care at [facility] thought due to uncontrolled blood pressure // No residual deficits // TTE (03/2010) at [facility] - normal LV systolic function, moderate pericardial effusion with 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   HSV-2 infection    Hyperlipidemia LDL goal < 100 04/01/2011   Hypertension    Lumbago    pain in the back   BP (!) 161/86   Pulse 63   Ht 5\' 8"  (1.727 m)   Wt 253 lb 9.6 oz (115 kg)  LMP 03/30/2016   SpO2 95%   BMI 38.56 kg/m   Opioid Risk Score:   Fall Risk Score:  `1  Depression screen [facility] 2/9     03/26/2022    1:15 PM 10/11/2018    5:09 PM 04/08/2016    9:26 AM 10/31/2014    4:36 PM 10/05/2014   11:36 AM 09/15/2014    3:23 PM 09/07/2014    1:35 PM  Depression screen PHQ 2/9  Decreased Interest 3 0 1 1 1  0 1  Down, Depressed, Hopeless 0 0 1 0 1 0 1  PHQ - 2 Score 3 0 2 1 2  0 2  Altered sleeping 2  2  1  1   Tired, decreased energy 1  1  2  1   Change in appetite 0  1  1  0  Feeling bad or failure about yourself  0  1  0  1  Trouble concentrating 1  1  1   0  Moving slowly or fidgety/restless 1  1  1   0  Suicidal thoughts 0  1  0  0  PHQ-9 Score 8  10  8  5   Difficult doing work/chores       Not difficult at all     Review of Systems  Musculoskeletal:  Positive for back pain.       Hip pain  All other systems reviewed and are negative.     Objective:   Physical Exam Vitals and nursing note reviewed.  Constitutional:      Appearance: She is obese.  HENT:     Head: Normocephalic and atraumatic.  Eyes:     General: No visual field deficit.     Extraocular Movements: Extraocular movements intact.     Conjunctiva/sclera: Conjunctivae normal.     Pupils: Pupils are equal, round, and reactive to light.  Musculoskeletal:     Right lower leg: No edema.     Left lower leg: No edema.  Skin:    General: Skin is warm and dry.  Neurological:     General: No focal deficit present.     Mental Status: She is alert and oriented to person, place, and time.     Cranial Nerves: No dysarthria or facial asymmetry.     Sensory: No sensory deficit.     Motor: No weakness or abnormal muscle tone.     Coordination: Romberg sign positive. Coordination abnormal. Finger-Nose-Finger Test normal.     Gait: Gait abnormal.     Comments: MMT  5/5 in BUE and BLE  Amb with walker wide based gait  Psychiatric:        Mood and Affect: Mood normal.    Cognition intact Neuro:  Eyes without evidence of nystagmus  Tone is normal without evidence of spasticity Cerebellar exam shows no evidence of ataxia on finger nose finger or heel to shin testing No evidence of trunkal ataxia  Motor strength is 5/5 in bilateral deltoid, biceps, triceps, finger flexors and extensors, wrist flexors and extensors, hip flexors, knee flexors and extensors, ankle dorsiflexors, plantar flexors, invertors and evertors, toe flexors and extensors  Sensory exam is normal to pinprick, proprioception and light touch in the upper and lower limbs   Cranial nerves II- Visual fields are intact to confrontation testing, no blurring of vision III- no evidence of ptosis, upward, downward and medial gaze intact IV- no vertical diplopia or head tilt V- no facial numbness or masseter weakness VI- no pupil abduction weakness VII-  no facial droop, good lid closure VII- normal auditory acuity IX- no pharygeal weakness, gag nl X- no pharyngeal weakness, no hoarseness XI- no trap or SCM weakness XII- no glossal weakness         Assessment & Plan:    Trunkal ataxia, 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  Needs continued Neuro f/u for intracranial stenosis as well as PCP management of BP.  Pt to call PCP for metoprolol refill. DIscussed possibility of OP therapy once HH is finished pt states she does not have transportation to OP  Anticipate some further improvement iwith balance over the next several months but will likely need an assistive device on a long term basis given the multiple CVAs in brainstem and cerebellum

## 2022-05-12 ENCOUNTER — Inpatient Hospital Stay: Payer: Medicare Other

## 2022-06-16 ENCOUNTER — Inpatient Hospital Stay: Payer: Medicare Other

## 2022-08-13 ENCOUNTER — Other Ambulatory Visit: Payer: Self-pay | Admitting: Registered Nurse

## 2022-08-15 ENCOUNTER — Other Ambulatory Visit: Payer: Self-pay | Admitting: Registered Nurse

## 2023-03-04 ENCOUNTER — Telehealth: Payer: 59 | Admitting: Dietician

## 2023-03-18 ENCOUNTER — Encounter: Payer: 59 | Attending: Internal Medicine | Admitting: Dietician

## 2023-03-18 DIAGNOSIS — E1169 Type 2 diabetes mellitus with other specified complication: Secondary | ICD-10-CM | POA: Insufficient documentation

## 2023-03-18 DIAGNOSIS — E785 Hyperlipidemia, unspecified: Secondary | ICD-10-CM | POA: Diagnosis present

## 2023-03-18 NOTE — Progress Notes (Addendum)
 Diabetes Self-Management Education  Visit Type: First/Initial  Appt. Start Time: 1400 Appt. End Time: 1509  03/18/2023   [name], identified by name and date of birth, is a 58 y.o. female with a diagnosis of Diabetes: Type 2.   I connected with  Michelle Brooks on 03/18/23 by telephone enabled telemedicine application and verified that I am speaking with the correct person using two identifiers.  Dietitian conducting appointment in office with patient stating their location of home. I discussed the limitations of evaluation and management by telemedicine. The patient expressed understanding and agreed to proceed.    ASSESSMENT  Patient would like to learn about A1C% and using her dexcom G7 CGM. Patient lives with newphew.  Pt does the shopping and nephew does the cooking.  Pt states she takes 80 units of Humalog  50/50 after lunch and 80 units before bed and states missed doses occur twice monthly. Pt reports a fear of hypoglycemia and states her blood sugar is too low to take insulin  safely before meals. Pt reports her blood sugar rises when she eats and she waits until her blood sugar is high enough to take her insulin  safely stating after meals. Pt reports she has made the following changes including staying active by taking the stairs every other day. Pt states she has also uses a stationary bike for 30 minutes on 3-4 days weekly. Pt is concerned that her CGM alarms that she has low blood sugar. Pt denies symptoms and states finger pricks do not match CGM data. Pt encouraged to calibrate CGM and enter finger pricks into reader for accurate data.  Pt states appropriate treatment options for hypoglycemia and states she has glucose gel on hand. All Pt's questions were answered during this enocutner.   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   HSV-2 infection    Hyperlipidemia LDL goal < 100 04/01/2011   Hypertension    Lumbago    pain in the back    Labs noted:   Lab Results  Component Value Date   HGBA1C 8.7 (H) 02/27/2022  Per referring provider A1C obtained 01/28/2023 of 10.3%  Last vitamin D Lab Results  Component Value Date   VD25OH 18 (L) 03/01/2021   Medications include:  Current Outpatient Medications:    cholecalciferol  (VITAMIN D3) 10 MCG (400 UNIT) TABS tablet, Take 1 tablet (400 Units total) by mouth daily., Disp: 30 tablet, Rfl: 0   clopidogrel  (PLAVIX ) 75 MG tablet, Take 1 tablet (75 mg total) by mouth daily., Disp: 90 tablet, Rfl: 3   insulin  lispro protamine -lispro (HUMALOG  50/50 MIX) (50-50) 100 UNIT/ML SUSP injection, Inject 0.5 mLs (50 Units total) into the skin 3 (three) times daily with meals., Disp: 10 mL, Rfl: 11   sitaGLIPtin (JANUVIA) 100 MG tablet, , Disp: , Rfl:    acetaminophen  (TYLENOL ) 325 MG tablet, Take 1-2  tablets (325-650 mg total) by mouth every 4 (four) hours as needed for mild pain., Disp: , Rfl:    atorvastatin  (LIPITOR ) 80 MG tablet, Take 1 tablet (80 mg total) by mouth daily., Disp: 30 tablet, Rfl: 0   Blood Glucose Monitoring Suppl (ACCU-CHEK AVIVA PLUS) W/DEVICE KIT, 1 kit by Does not apply route 3 (three) times daily before meals., Disp: 1 kit, Rfl: 0   Blood Glucose Monitoring Suppl (TRUE METRIX METER) DEVI, 1 each by Does not apply route 4 (four) times daily -  before meals and at bedtime., Disp: 1 Device, Rfl: 0   diclofenac  Sodium  (VOLTAREN ) 1 % GEL, Apply 2 g topically 3 (three) times daily., Disp: , Rfl:    diphenhydrAMINE  (BENADRYL ) 25 mg capsule, Take 1 capsule (25 mg total) by mouth every 8 (eight) hours as needed for allergies or sleep., Disp: 30 capsule, Rfl: 0   glucose blood (TRUE METRIX BLOOD GLUCOSE TEST) test strip, Use as instructed, Disp: 100 each, Rfl: 5   metoprolol  tartrate (LOPRESSOR ) 50 MG tablet, Take 1 tablet (50 mg total) by mouth 2 (two) times daily., Disp: 60 tablet, Rfl: 0   TRUEPLUS LANCETS 28G MISC, 1 each by Does not apply route 4 (four) times daily -  before meals and at bedtime., Disp: 100 each, Rfl: 5   valsartan (DIOVAN) 160 MG tablet, Take 160 mg by mouth daily., Disp: , Rfl:    valsartan (DIOVAN) 40 MG tablet, Take 40 mg by mouth daily., Disp: , Rfl:     Last menstrual period 03/30/2016. There is no height or weight on file to calculate BMI.   Diabetes Self-Management Education - 03/18/23 1408       Visit Information   Visit Type First/Initial      Initial Visit   Diabetes Type Type 2    Date Diagnosed 2006    Are you taking your medications as prescribed? Yes      Health Coping   How would you rate your overall health? Excellent      Psychosocial Assessment   Patient Belief/Attitude about Diabetes Motivated to manage diabetes    What is the hardest part about your diabetes right now, causing you the most concern, or is the most worrisome to you about your diabetes?   Taking/obtaining medications    Self-care barriers None    Self-management support Doctor's office    Other persons present Patient    Patient Concerns Medication    Special Needs None    Preferred Learning Style No preference indicated    Learning Readiness Change in progress    How often do you need to have someone help you when you read instructions, pamphlets, or other written materials from your doctor or pharmacy? 2 - Rarely    What is the last grade level you completed in school? 12th       Pre-Education Assessment   Patient understands the diabetes disease and treatment process. Needs Instruction    Patient understands incorporating nutritional management into lifestyle. Needs Instruction    Patient undertands incorporating physical activity into lifestyle. Needs Instruction    Patient understands using medications safely. Needs Instruction    Patient understands monitoring blood glucose, interpreting and using results Needs Instruction    Patient understands prevention, detection, and treatment of acute complications. Needs Instruction    Patient understands prevention, detection, and treatment of chronic complications. Needs Instruction    Patient understands how to develop strategies to address psychosocial issues. Needs Instruction  Patient understands how to develop strategies to promote health/change behavior. Needs Instruction      Complications   Last HgB A1C per patient/outside source 10.3 %    How often do you check your blood sugar? > 4 times/day    Fasting Blood glucose range (mg/dL) 869-820    Postprandial Blood glucose range (mg/dL) >799    Number of hypoglycemic episodes per month 4   per CGM; Pt not calibrating   Can you tell when your blood sugar is low? Yes    What do you do if your blood sugar is low? sweating, weak    Number of hyperglycemic episodes ( >200mg /dL): Daily    Can you tell when your blood sugar is high? No    Have you had a dilated eye exam in the past 12 months? Yes    Have you had a dental exam in the past 12 months? No    Are you checking your feet? Yes    How many days per week are you checking your feet? 7      Dietary Intake   Breakfast egg, sausage on crossiant or boiled eggs with sausage or    Lunch bagel with cream cheese, hot tea with splenda    Snack (afternoon) trail mix or pretzels or fruit i.e melon or apple or banana    Dinner cold cuts, cheese, 2 slices of wheat breadm, mayo or rice, chicken sometimes greens or green  beans    Beverage(s) water, tea with splenda and lemon, coffee with SF french vanilla creamer, lipton diet      Activity / Exercise   Activity / Exercise Type Light (walking / raking leaves)    How many days per week do you exercise? 4    How many minutes per day do you exercise? 5    Total minutes per week of exercise 20      Patient Education   Previous Diabetes Education No    Disease Pathophysiology Explored patient's options for treatment of their diabetes    Healthy Eating Plate Method;Reviewed blood glucose goals for pre and post meals and how to evaluate the patients' food intake on their blood glucose level.;Role of diet in the treatment of diabetes and the relationship between the three main macronutrients and blood glucose level    Being Active Role of exercise on diabetes management, blood pressure control and cardiac health.    Medications Reviewed patients medication for diabetes, action, purpose, timing of dose and side effects.    Monitoring Identified appropriate SMBG and/or A1C goals.    Acute complications Taught prevention, symptoms, and  treatment of hypoglycemia - the 15 rule.    Chronic complications Relationship between chronic complications and blood glucose control    Diabetes Stress and Support Identified and addressed patients feelings and concerns about diabetes;Worked with patient to identify barriers to care and solutions    Lifestyle and Health Coping Lifestyle issues that need to be addressed for better diabetes care      Individualized Goals (developed by patient)   Nutrition Follow meal plan discussed    Physical Activity Exercise 5-7 days per week    Medications take my medication as prescribed   call your provider regarding the dose and timing fo your insulin    Monitoring  Consistenly use CGM    Problem Solving Medication consistency;Eating Pattern    Reducing Risk examine blood glucose patterns;treat hypoglycemia with 15 grams of carbs if blood glucose  less than 70mg /dL  Health Coping Ask for help with psychological, social, or emotional issues      Post-Education Assessment   Patient understands the diabetes disease and treatment process. Needs Review    Patient understands incorporating nutritional management into lifestyle. Needs Review    Patient undertands incorporating physical activity into lifestyle. Comprehends key points    Patient understands using medications safely. Needs Review    Patient understands monitoring blood glucose, interpreting and using results Needs Review    Patient understands prevention, detection, and treatment of acute complications. Needs Review    Patient understands prevention, detection, and treatment of chronic complications. Needs Review    Patient understands how to develop strategies to address psychosocial issues. Needs Review    Patient understands how to develop strategies to promote health/change behavior. Needs Review      Outcomes   Expected Outcomes Demonstrated interest in learning. Expect positive outcomes    Future DMSE 3-4 months    Program Status Not Completed             Individualized Plan for Diabetes Self-Management Training:   Learning Objective:  Patient will have a greater understanding of diabetes self-management. Patient education plan is to attend individual and/or group sessions per assessed needs and concerns.   Plan:   Patient Instructions  Discuss with your prescribing provider you insulin  timing and dosing  Expected Outcomes:  Demonstrated interest in learning. Expect positive outcomes  Education material provided: ADA - How to Thrive: A Guide for Your Journey with Diabetes, My Plate, and Snack sheet  If problems or questions, patient to contact team via:  Phone  Future DSME appointment: 3-4 months

## 2023-03-18 NOTE — Patient Instructions (Addendum)
 Discuss with your prescribing provider your insulin  timing and dosing

## 2023-06-01 LAB — COLOGUARD: COLOGUARD: NEGATIVE

## 2023-10-19 ENCOUNTER — Other Ambulatory Visit: Payer: Self-pay | Admitting: Internal Medicine

## 2023-10-19 DIAGNOSIS — Z1231 Encounter for screening mammogram for malignant neoplasm of breast: Secondary | ICD-10-CM

## 2023-10-22 ENCOUNTER — Ambulatory Visit
Admission: RE | Admit: 2023-10-22 | Discharge: 2023-10-22 | Disposition: A | Discharge status: Home / Self Care | Source: Ambulatory Visit | Attending: Internal Medicine | Admitting: Internal Medicine

## 2023-10-22 DIAGNOSIS — Z1231 Encounter for screening mammogram for malignant neoplasm of breast: Secondary | ICD-10-CM

## 2024-03-28 ENCOUNTER — Ambulatory Visit: Admitting: Dermatology
